# Patient Record
Sex: Female | Born: 1965
Health system: Southern US, Community
[De-identification: ages and names within clinical notes are randomized; demographics above are authoritative.]

## PROBLEM LIST (undated history)

## (undated) DIAGNOSIS — M069 Rheumatoid arthritis, unspecified: Secondary | ICD-10-CM

## (undated) DIAGNOSIS — K259 Gastric ulcer, unspecified as acute or chronic, without hemorrhage or perforation: Secondary | ICD-10-CM

## (undated) DIAGNOSIS — I82409 Acute embolism and thrombosis of unspecified deep veins of unspecified lower extremity: Secondary | ICD-10-CM

## (undated) DIAGNOSIS — K5900 Constipation, unspecified: Secondary | ICD-10-CM

## (undated) DIAGNOSIS — E559 Vitamin D deficiency, unspecified: Secondary | ICD-10-CM

## (undated) DIAGNOSIS — M1990 Unspecified osteoarthritis, unspecified site: Secondary | ICD-10-CM

## (undated) DIAGNOSIS — K5792 Diverticulitis of intestine, part unspecified, without perforation or abscess without bleeding: Secondary | ICD-10-CM

## (undated) DIAGNOSIS — M549 Dorsalgia, unspecified: Secondary | ICD-10-CM

## (undated) DIAGNOSIS — R112 Nausea with vomiting, unspecified: Secondary | ICD-10-CM

## (undated) DIAGNOSIS — R7303 Prediabetes: Secondary | ICD-10-CM

## (undated) DIAGNOSIS — Z789 Other specified health status: Secondary | ICD-10-CM

## (undated) DIAGNOSIS — K589 Irritable bowel syndrome without diarrhea: Secondary | ICD-10-CM

## (undated) DIAGNOSIS — M2550 Pain in unspecified joint: Secondary | ICD-10-CM

## (undated) DIAGNOSIS — Z9889 Other specified postprocedural states: Secondary | ICD-10-CM

## (undated) DIAGNOSIS — I10 Essential (primary) hypertension: Secondary | ICD-10-CM

## (undated) DIAGNOSIS — F419 Anxiety disorder, unspecified: Secondary | ICD-10-CM

## (undated) DIAGNOSIS — D649 Anemia, unspecified: Secondary | ICD-10-CM

## (undated) HISTORY — DX: Acute embolism and thrombosis of unspecified deep veins of unspecified lower extremity: I82.409

## (undated) HISTORY — DX: Rheumatoid arthritis, unspecified: M06.9

## (undated) HISTORY — DX: Pain in unspecified joint: M25.50

## (undated) HISTORY — DX: Dorsalgia, unspecified: M54.9

## (undated) HISTORY — DX: Gastric ulcer, unspecified as acute or chronic, without hemorrhage or perforation: K25.9

## (undated) HISTORY — PX: ABDOMINAL HYSTERECTOMY: SHX81

## (undated) HISTORY — DX: Irritable bowel syndrome, unspecified: K58.9

## (undated) HISTORY — DX: Constipation, unspecified: K59.00

## (undated) HISTORY — DX: Vitamin D deficiency, unspecified: E55.9

## (undated) HISTORY — DX: Unspecified osteoarthritis, unspecified site: M19.90

---

## 2002-11-09 ENCOUNTER — Ambulatory Visit (HOSPITAL_COMMUNITY): Admission: RE | Admit: 2002-11-09 | Discharge: 2002-11-09 | Payer: Self-pay | Admitting: Family Medicine

## 2002-11-09 ENCOUNTER — Encounter: Payer: Self-pay | Admitting: Family Medicine

## 2003-02-18 ENCOUNTER — Ambulatory Visit (HOSPITAL_COMMUNITY): Admission: RE | Admit: 2003-02-18 | Discharge: 2003-02-18 | Payer: Self-pay | Admitting: Family Medicine

## 2007-10-14 ENCOUNTER — Encounter: Admission: RE | Admit: 2007-10-14 | Discharge: 2007-10-14 | Payer: Self-pay | Admitting: Internal Medicine

## 2007-10-23 ENCOUNTER — Encounter: Admission: RE | Admit: 2007-10-23 | Discharge: 2007-10-23 | Payer: Self-pay | Admitting: Internal Medicine

## 2008-03-04 ENCOUNTER — Inpatient Hospital Stay (HOSPITAL_COMMUNITY): Admission: RE | Admit: 2008-03-04 | Discharge: 2008-03-06 | Payer: Self-pay | Admitting: Obstetrics and Gynecology

## 2008-03-04 ENCOUNTER — Encounter (INDEPENDENT_AMBULATORY_CARE_PROVIDER_SITE_OTHER): Payer: Self-pay | Admitting: Obstetrics and Gynecology

## 2010-08-16 NOTE — Op Note (Signed)
[name], [name] NO.:  [id number]   MEDICAL RECORD NO.:  [id number]          PATIENT TYPE:  INP   LOCATION:  9305                          FACILITY:  [facility]   PHYSICIAN:  Juluis Mire, M.D.   DATE OF BIRTH:  11-25-1965   DATE OF PROCEDURE:  03/04/2008  DATE OF DISCHARGE:                               OPERATIVE REPORT   PREOPERATIVE DIAGNOSIS:  Uterine fibroids.   POSTOPERATIVE DIAGNOSES:  1. Uterine fibroids.  2. Sigmoid colonic mass.   OPERATIVE PROCEDURE:  Total abdominal hysterectomy.  Subsequent General  Surgical evaluation of the colonic mass.   SURGEON:  Juluis Mire, MD   ASSISTANT:  Stann Mainland. Grewal, MD   ANESTHESIA:  General endotracheal.   ESTIMATED BLOOD LOSS:  300 mL.   PACKS AND DRAINS:  None.   INJECTABLES:   INTRAOPERATIVE BLOOD PLACED:  None.   COMPLICATIONS:  None.   INDICATIONS:  As dictated in the history and physical.   PROCEDURE:  The patient was taken to the OR and placed in supine  position.  After satisfactory level of general endotracheal anesthesia  was obtained, the abdomen was prepped out with Betadine and draped in  sterile field.  Low-transverse skin incision was made with knife and  carried through subcutaneous tissue.  The anterior rectus fascia was  entered sharply and the incision in the fascia was extended laterally.  Fascia was taken off the muscle superiorly and inferiorly.  Rectus  muscles were separated in the midline.  Peritoneum was entered sharply.  Incision of peritoneum extended both superiorly and inferiorly.  Uterus  delivered through the incision.  It was markedly enlarged with uterine  fibroids approximately 20 weeks in size.  Tubes and ovaries were  unremarkable.  First, the right round ligament was clamped, cut, and  suture ligated with 0-Vicryl.  The right utero-ovarian pedicle was  isolated, clamped, cut, and doubly ligated with a free tie of 0-Vicryl  and then suture ligature of  0-Vicryl.  Because of some bleeding, we did  some back clamping and then identified the uterine vessels on the right  side.  These were clamped, cut, and suture ligated with 0-Vicryl.  We  then went to the left side.  We were able to clamp the left round  ligament and utero-ovarian pedicles together.  We then doubly ligated  these with a free tie of 0-Vicryl and then a suture ligature of 0-  Vicryl.  The bladder flap was then developed using clamp, cut, and tied  technique with suture ligature of 0-Vicryl.  The parametrium was  serially separated from the size of uterus.  Once we got down to the  cervical stump, we cut the uterine fundus free and passed off the  operative field.  We then placed the O'Connor-O'Sullivan retractor in  place and packed bowel content superiorly.  Now, we had a complete view  of the cervical stump.  We made sure the bladder was well off this, and  then continuing the clamp, cut, and tie technique with suture ligature  of 0-Vicryl, the parametrium was confirmed to be separate  from the  cervical stump.  Vaginal angles were clamped and cut.  Intervening  vaginal mucosa was excised and cervical stump was passed off the  operative field and sent to Pathology.  Held angles were secured with  suture ligatures of 0-Vicryl.  The intervening vaginal mucosa was closed  with interrupted figure-of-eight 0-Vicryl.  At this point in time, we  looked at both the ovaries, they were hemostatically intact.  We  thoroughly irrigated the pelvis and we had good hemostasis of the  vaginal cuff.  At this point, we explored the upper abdomen and the  sigmoid colon on the left side was fullness.  We identified the fat that  was encroaching on this was felt to be an inflammatory issue.  The  appendix was visualized and noted to be normal.  We subsequently called  General Surgery in for evaluation.  Dr. Donell Beers came in and felt that  this was probably inflammatory area and recommended  followup  postoperatively.  At this point in time, the muscles and peritoneum were  closed with a running suture of 3-0 Vicryl.  Fascia was closed with a  running suture of 0-PDS.  Skin was closed with staples and Steri-Strips.  Sponge, instrument, and needle count was correct by circulating nurse  x2.  Foley catheter remained clear at the time of closure.  The patient  tolerated the procedure well and was returned to recovery room in good  condition.      Juluis Mire, M.D.  Electronically Signed     JSM/MEDQ  D:  03/04/2008  T:  03/04/2008  Job:  [id number]

## 2010-08-16 NOTE — Discharge Summary (Signed)
NAMEVIANNE, GRIESHOP NO.:  [id number]   MEDICAL RECORD NO.:  [id number]          PATIENT TYPE:  INP   LOCATION:  9305                          FACILITY:  [facility]   PHYSICIAN:  Juluis Mire, M.D.   DATE OF BIRTH:  [birth year]   DATE OF ADMISSION:  03/04/2008  DATE OF DISCHARGE:  03/06/2008                               DISCHARGE SUMMARY   ADMITTING DIAGNOSIS:  Uterine fibroids.   DISCHARGE DIAGNOSIS:  Uterine fibroids.   OPERATIVE PROCEDURE:  Total abdominal hysterectomy.   For complete history and physical, please dictated note.   HOSPITAL COURSE:  The patient underwent above-noted surgery.  Postop did  extremely well.  Postop hemoglobin was 11.1.  She was discharged home on  her second postop day.  At that time, she was tolerating a regular diet,  ambulating without difficulty.  She had normal bladder function.  She  was afebrile with stable vital signs.  Low-transverse incision was  intact.  Abdomen was soft and nontender.  She was passing flatus.  She  had no active vaginal bleeding.   In terms of complications, none were encountered during her stay in the  hospital.  The patient was discharged home in stable condition.   DISPOSITION:  Routine postop instructions were given.  She is to avoid  heavy lifting, vaginal entrance, driving a car.  She is to watch for  signs of infection, nausea, vomiting, increased abdominal pain, or  active vaginal bleeding.  She is also instructed of signs and symptoms  of deep venous thrombosis and pulmonary embolus.   Medications include Tylox.  She will follow up in the office in 1 week  to remove staples.      Juluis Mire, M.D.  Electronically Signed     JSM/MEDQ  D:  03/06/2008  T:  03/06/2008  Job:  [id number]

## 2010-08-16 NOTE — H&P (Signed)
NAME:  [name], BRINES NO.:  [id number]   MEDICAL RECORD NO.:  [id number]          PATIENT TYPE:  AMB   LOCATION:  SDC                           FACILITY:  [facility]   PHYSICIAN:  Juluis Mire, M.D.   DATE OF BIRTH:  June 27, 1965   DATE OF ADMISSION:  DATE OF DISCHARGE:                              HISTORY & PHYSICAL   The patient is a 45 year old nulligravida single female who presents for  total abdominal hysterectomy.   In relation to present admission, the patient was initially referred to  our office on January 06, 2008.  At that point in time, she had been  having increasing menstrual flow.  Cycles were extremely heavy and  prolonged.  She was also having increasing abdominal pain and bloating  that is associated with her cycles.  She had undergone a CT and pelvic  ultrasound that confirmed large uterine fibroids.  Her ovaries were  unremarkable.  Evaluation in the office did reveal fibroids up to the  umbilicus.  We discussed various options.  She decided on hysterectomy  for which she is admitted at the present time.   ALLERGIES:  In terms of allergies, the patient has no known drug  allergies listed.   MEDICATIONS:  She is on Benicar for hypertension, vitamin D replacement,  and Nexium for gastroesophageal reflux disorder.   PAST MEDICAL HISTORY:  Significant that she does have a history of  hypertension under active management.   She has had no previous surgical or obstetrical history.   FAMILY HISTORY:  Noncontributory.   SOCIAL HISTORY:  No tobacco or alcohol use.   REVIEW OF SYSTEMS:  Noncontributory.   PHYSICAL EXAMINATION:  GENERAL:  The patient is afebrile.  VITAL SIGNS:  Stable.  HEENT:  The patient is normocephalic.  Pupils equal, round, and reactive  to light and accommodation.  Extraocular movements are intact.  Sclerae  and conjunctivae are clear.  Oropharynx clear.  NECK:  Without thyromegaly.  BREASTS:  No discrete masses.  LUNGS:   Clear.  CARDIAC:  Regular rate.  No murmurs or gallops.  ABDOMEN:  The uterus rising up to the umbilicus.  Otherwise, abdominal  exam is benign.  PELVIC:  Normal external genitalia.  Vaginal mucosa is clear.  Cervix  unremarkable.  Uterus massively enlarged with fibroids, 20 weeks in  size.  Adnexa difficult to access.  EXTREMITIES:  Trace edema.  NEUROLOGIC:  Grossly within normal limits.   IMPRESSION:  1. Large uterine fibroids with associated symptomatology.  2. Hypertension.   PLAN:  The patient underwent total abdominal hysterectomy.  Per patient  request, ovaries will be conserved.  Potential risk of malignant  transformation explained.  Relieving ovaries can also lead to further  pelvic pain issues from adhesions.  The risk of procedure explained  including the risk of infection.  The risk of hemorrhage.  The patient  is a TE[employer] teacher [employer] and declines any blood transfusions.  We have  discussed that if she had excessive bleeding without transfusions, the  patient could potentially die.  The patient does understand this and  wishes to have  no blood products whatsoever.  Her hemoglobin is  relatively stable at the present time.  She is also at risk of injury to  adjacent organs including bladder, bowel, ureters that could require  further exploratory surgery.  Risk of deep venous thrombosis and  pulmonary emboli.  The patient does understand indications, risks, and  other options.      Juluis Mire, M.D.  Electronically Signed     JSM/MEDQ  D:  03/04/2008  T:  03/04/2008  Job:  [id number]

## 2010-08-16 NOTE — Op Note (Signed)
[name], [name] NO.:  [id number]   MEDICAL RECORD NO.:  [id number]          PATIENT TYPE:  INP   LOCATION:  9305                          FACILITY:  [facility]   PHYSICIAN:  Almond Lint, MD       DATE OF BIRTH:  11-28-1965   DATE OF PROCEDURE:  03/04/2008  DATE OF DISCHARGE:                               OPERATIVE REPORT   PREOPERATIVE DIAGNOSIS:  Colonic inflammation.   POSTOPERATIVE DIAGNOSIS:  Sigmoid mass versus diverticulitis.   PROCEDURES PERFORMED:  1. Exploratory laparotomy.  2. Diagnostic laparoscopy.   FINDINGS:  Inflamed sigmoid with no evidence of perforation, several  diverticula are present, and there is thickening in the sigmoid that is  unclear if it is strictly inflammatory or if there is an internal mass.   DESCRIPTION OF PROCEDURE:  [name] was identified in the holding  area and taken to the operating room, where [name] performed an open  hysterectomy on her for fibroids.  He found inflammation of the sigmoid  and requested an intraoperative General Surgery consultation.  I came in  after he was finished with his portion of the case and examined the  sigmoid.  There were multiple diverticula present on the sigmoid and at  the site of the inflammation, which was midsigmoid, there was some  creeping fat up around the sigmoid and no evidence of lymphadenopathy.  The sigmoid was indeed thickened focally approximately 8 cm in length.  The liver was examined by palpation and there was a spot just to the  left of the falciform on the left lobe, which felt like a potential  metastasis.  Given the fact we had a Pfannenstiel incision, this was not  able to be directly visualized.  The laparoscopy equipment was brought  into the room and the camera was placed over my hand in order to  visualize the liver.  The entire surface of the liver could not be  visualized, but at the spot where on the left of the falciform, the spot  where I could  feel a small subcentimeter nodule, there was no evidence  of metastasis on the surface of the liver.  The liver was again palpated  and a small area near the gallbladder was also palpated and again  nothing was seen on the surface of the liver with laparoscopy.  The  gallbladder appeared normal as well.  The decision was made, since it  was unclear whether this was a mass or a diverticulitis, that she may in  fact not require operative therapy.  She has had no symptoms of  diverticulitis and will require colonoscopy.  In this method we can  assess whether or not she has any colon cancer or mass at the sigmoid  and also evaluate the rest of the colon.  Additionally, discussion could  be held with the patient regarding high-fiber diet if she strictly has  diagnosis of diverticulitis.  [name] closed the patient's abdomen.  The patient should follow up with Dr. Bertram Savin with a colonoscopy in  the next 2-4 weeks.  Almond Lint, MD  Electronically Signed     FB/MEDQ  D:  03/04/2008  T:  03/05/2008  Job:  [id number]

## 2011-01-06 LAB — BASIC METABOLIC PANEL
BUN: 8 mg/dL (ref 6–23)
CO2: 28 mEq/L (ref 19–32)
Calcium: 9.2 mg/dL (ref 8.4–10.5)
Chloride: 97 mEq/L (ref 96–112)
Creatinine, Ser: 0.9 mg/dL (ref 0.4–1.2)
GFR calc Af Amer: >60 mL/min (ref >60)
GFR calc non Af Amer: >60 mL/min (ref >60)
Glucose, Bld: 104 mg/dL — ABNORMAL HIGH (ref 70–99)
Potassium: 3.3 mEq/L — ABNORMAL LOW (ref 3.5–5.1)
Sodium: 135 mEq/L (ref 135–145)

## 2011-01-06 LAB — CBC
HCT: 33.7 % — ABNORMAL LOW (ref 36.0–46.0)
HCT: 38 % (ref 36.0–46.0)
Hemoglobin: 11.1 g/dL — ABNORMAL LOW (ref 12.0–15.0)
Hemoglobin: 12.5 g/dL (ref 12.0–15.0)
MCHC: 32.8 g/dL (ref 30.0–36.0)
MCHC: 33 g/dL (ref 30.0–36.0)
MCV: 80.4 fL (ref 78.0–100.0)
MCV: 80.7 fL (ref 78.0–100.0)
Platelets: 293 10*3/uL (ref 150–400)
Platelets: 354 10*3/uL (ref 150–400)
RBC: 4.18 MIL/uL (ref 3.87–5.11)
RBC: 4.73 MIL/uL (ref 3.87–5.11)
RDW: 17.7 % — ABNORMAL HIGH (ref 11.5–15.5)
RDW: 17.8 % — ABNORMAL HIGH (ref 11.5–15.5)
WBC: 12.1 10*3/uL — ABNORMAL HIGH (ref 4.0–10.5)
WBC: 7.4 10*3/uL (ref 4.0–10.5)

## 2011-01-06 LAB — HCG, SERUM, QUALITATIVE: Preg, Serum: NEGATIVE

## 2012-01-05 ENCOUNTER — Other Ambulatory Visit: Payer: Self-pay | Admitting: Gastroenterology

## 2012-01-05 DIAGNOSIS — R109 Unspecified abdominal pain: Secondary | ICD-10-CM

## 2012-01-08 ENCOUNTER — Other Ambulatory Visit: Payer: Self-pay

## 2012-10-05 ENCOUNTER — Emergency Department (HOSPITAL_COMMUNITY): Payer: BC Managed Care – PPO

## 2012-10-05 ENCOUNTER — Encounter (HOSPITAL_COMMUNITY): Payer: Self-pay | Admitting: *Deleted

## 2012-10-05 ENCOUNTER — Emergency Department (HOSPITAL_COMMUNITY)
Admission: EM | Admit: 2012-10-05 | Discharge: 2012-10-05 | Disposition: A | Discharge status: Home / Self Care | Payer: BC Managed Care – PPO | Attending: Emergency Medicine | Admitting: Emergency Medicine

## 2012-10-05 DIAGNOSIS — K5732 Diverticulitis of large intestine without perforation or abscess without bleeding: Secondary | ICD-10-CM | POA: Insufficient documentation

## 2012-10-05 DIAGNOSIS — K5792 Diverticulitis of intestine, part unspecified, without perforation or abscess without bleeding: Secondary | ICD-10-CM

## 2012-10-05 DIAGNOSIS — Z90710 Acquired absence of both cervix and uterus: Secondary | ICD-10-CM | POA: Insufficient documentation

## 2012-10-05 DIAGNOSIS — R110 Nausea: Secondary | ICD-10-CM | POA: Insufficient documentation

## 2012-10-05 HISTORY — DX: Diverticulitis of intestine, part unspecified, without perforation or abscess without bleeding: K57.92

## 2012-10-05 HISTORY — DX: Essential (primary) hypertension: I10

## 2012-10-05 LAB — URINALYSIS, ROUTINE W REFLEX MICROSCOPIC
Bilirubin Urine: NEGATIVE
Glucose, UA: NEGATIVE mg/dL
Hgb urine dipstick: NEGATIVE
Ketones, ur: NEGATIVE mg/dL
Leukocytes, UA: NEGATIVE
Nitrite: NEGATIVE
Protein, ur: NEGATIVE mg/dL
Specific Gravity, Urine: 1.033 — ABNORMAL HIGH (ref 1.005–1.030)
Urobilinogen, UA: 0.2 mg/dL (ref 0.0–1.0)
pH: 6 (ref 5.0–8.0)

## 2012-10-05 LAB — CBC WITH DIFFERENTIAL/PLATELET
Basophils Absolute: 0 10*3/uL (ref 0.0–0.1)
Basophils Relative: 0 % (ref 0–1)
Eosinophils Absolute: 0.1 10*3/uL (ref 0.0–0.7)
Eosinophils Relative: 0 % (ref 0–5)
HCT: 34.7 % — ABNORMAL LOW (ref 36.0–46.0)
Hemoglobin: 12.3 g/dL (ref 12.0–15.0)
Lymphocytes Relative: 16 % (ref 12–46)
Lymphs Abs: 2 10*3/uL (ref 0.7–4.0)
MCH: 26.8 pg (ref 26.0–34.0)
MCHC: 35.4 g/dL (ref 30.0–36.0)
MCV: 75.6 fL — ABNORMAL LOW (ref 78.0–100.0)
Monocytes Absolute: 0.7 10*3/uL (ref 0.1–1.0)
Monocytes Relative: 6 % (ref 3–12)
Neutro Abs: 9.6 10*3/uL — ABNORMAL HIGH (ref 1.7–7.7)
Neutrophils Relative %: 78 % — ABNORMAL HIGH (ref 43–77)
Platelets: 254 10*3/uL (ref 150–400)
RBC: 4.59 MIL/uL (ref 3.87–5.11)
RDW: 14.9 % (ref 11.5–15.5)
WBC: 12.4 10*3/uL — ABNORMAL HIGH (ref 4.0–10.5)

## 2012-10-05 LAB — POCT I-STAT, CHEM 8
BUN: 15 mg/dL (ref 6–23)
Calcium, Ion: 1.15 mmol/L (ref 1.12–1.23)
Chloride: 106 mEq/L (ref 96–112)
Creatinine, Ser: 0.9 mg/dL (ref 0.50–1.10)
Glucose, Bld: 99 mg/dL (ref 70–99)
HCT: 38 % (ref 36.0–46.0)
Hemoglobin: 12.9 g/dL (ref 12.0–15.0)
Potassium: 3.6 mEq/L (ref 3.5–5.1)
Sodium: 141 mEq/L (ref 135–145)
TCO2: 24 mmol/L (ref 0–100)

## 2012-10-05 MED ORDER — ONDANSETRON HCL 4 MG/2ML IJ SOLN
4.0000 mg | Freq: Once | INTRAMUSCULAR | Status: AC
  Administered 2012-10-05: 4 mg via INTRAVENOUS
  Filled 2012-10-05: qty 2

## 2012-10-05 MED ORDER — IOHEXOL 300 MG/ML  SOLN
50.0000 mL | Freq: Once | INTRAMUSCULAR | Status: AC | PRN
  Administered 2012-10-05: 50 mL via ORAL

## 2012-10-05 MED ORDER — IOHEXOL 300 MG/ML  SOLN
100.0000 mL | Freq: Once | INTRAMUSCULAR | Status: AC | PRN
  Administered 2012-10-05: 100 mL via INTRAVENOUS

## 2012-10-05 MED ORDER — ONDANSETRON HCL 8 MG PO TABS: 8.0000 mg | ORAL_TABLET | Freq: Three times a day (TID) | ORAL | Status: DC | PRN

## 2012-10-05 MED ORDER — CIPROFLOXACIN HCL 500 MG PO TABS
500.0000 mg | ORAL_TABLET | Freq: Once | ORAL | Status: AC
  Administered 2012-10-05: 500 mg via ORAL
  Filled 2012-10-05: qty 1

## 2012-10-05 MED ORDER — CIPROFLOXACIN HCL 500 MG PO TABS: 500.0000 mg | ORAL_TABLET | Freq: Two times a day (BID) | ORAL | Status: DC

## 2012-10-05 MED ORDER — METRONIDAZOLE 500 MG PO TABS: 500.0000 mg | ORAL_TABLET | Freq: Three times a day (TID) | ORAL | Status: DC

## 2012-10-05 MED ORDER — METRONIDAZOLE 500 MG PO TABS
500.0000 mg | ORAL_TABLET | Freq: Once | ORAL | Status: AC
  Administered 2012-10-05: 500 mg via ORAL
  Filled 2012-10-05: qty 1

## 2012-10-05 NOTE — ED Notes (Signed)
Pt presents to ed with c/o abdominal pain x 1 week, pt sts was seen by her PCP and was diagnosed with diverticulitis; pt sts was given antibiotics and was put on liquid diet but per pt it's not working. Pt reports generalized abd.pain but she says it's much worse on the left side. Pt also reports nausea and occasional fever.

## 2012-10-05 NOTE — ED Provider Notes (Signed)
History    CSN: [id number] Arrival date & time 10/05/12  4098  First MD Initiated Contact with Patient 10/05/12 [phone]     No chief complaint on file.  (Consider location/radiation/quality/duration/timing/severity/associated sxs/prior Treatment) HPI complains of left-sided abdominal pain, burning in nature onset several months ago becoming worse over the past week. Chills and subjective fever 6 days ago. Accompanying symptoms include nausea. No vomiting. Patient had 4 bowel movements today, normal. Pain became worse last night after she ate a fried pork chop. Pain feels like diverticulitis she's had in the past. No treatment prior to coming here. Pain is worse with eating. Improve when she doesn't eat. Pain is mild at present. Nonradiating. No past medical history on file. No past surgical history on file. No family history on file. History  Substance Use Topics  . Smoking status: Not on file  . Smokeless tobacco: Not on file  . Alcohol Use: Not on file   past medical history hypertension, diverticulitis   Past surgical history hysterectomy Social history no tobacco no alcohol no drugs   OB History   No data available     Review of Systems  Constitutional: Negative.   HENT: Negative.   Respiratory: Negative.   Cardiovascular: Negative.   Gastrointestinal: Positive for nausea and abdominal pain.  Musculoskeletal: Negative.   Skin: Negative.   Neurological: Negative.   Psychiatric/Behavioral: Negative.   All other systems reviewed and are negative.    Allergies  Review of patient's allergies indicates not on file.  Home Medications  No current outpatient prescriptions on file. BP 117/72  Pulse 114  Temp(Src) 97.7 F (36.5 C) (Oral)  Resp 18  SpO2 100% Physical Exam  Nursing note and vitals reviewed. Constitutional: She appears well-developed and well-nourished.  HENT:  Head: Normocephalic and atraumatic.  Eyes: Conjunctivae are normal. Pupils are equal, round,  and reactive to light.  Neck: Neck supple. No tracheal deviation present. No thyromegaly present.  Cardiovascular: Normal rate and regular rhythm.   No murmur heard. Pulmonary/Chest: Effort normal and breath sounds normal.  Abdominal: Soft. Bowel sounds are normal. She exhibits no distension and no mass. There is tenderness. There is no rebound and no guarding.  Obese, tender at left upper and left lower quadrants  Musculoskeletal: Normal range of motion. She exhibits no edema and no tenderness.  Neurological: She is alert. Coordination normal.  Skin: Skin is warm and dry. No rash noted.  Psychiatric: She has a normal mood and affect.    ED Course  Procedures (including critical care time) Labs Reviewed - No data to display No results found. No diagnosis found. Declines pain medicine  11:50 AM patient resting comfortably. Pain is minimal. Nausea has resolved after treatment with intravenous Zofran. Results for orders placed during the hospital encounter of 10/05/12  CBC WITH DIFFERENTIAL      Result Value Range   WBC 12.4 (*) 4.0 - 10.5 K/uL   RBC 4.59  3.87 - 5.11 MIL/uL   Hemoglobin 12.3  12.0 - 15.0 g/dL   HCT 47.8 (*) 29.5 - 62.1 %   MCV 75.6 (*) 78.0 - 100.0 fL   MCH 26.8  26.0 - 34.0 pg   MCHC 35.4  30.0 - 36.0 g/dL   RDW 30.8  65.7 - 84.6 %   Platelets 254  150 - 400 K/uL   Neutrophils Relative % 78 (*) 43 - 77 %   Neutro Abs 9.6 (*) 1.7 - 7.7 K/uL   Lymphocytes Relative 16  12 - 46 %   Lymphs Abs 2.0  0.7 - 4.0 K/uL   Monocytes Relative 6  3 - 12 %   Monocytes Absolute 0.7  0.1 - 1.0 K/uL   Eosinophils Relative 0  0 - 5 %   Eosinophils Absolute 0.1  0.0 - 0.7 K/uL   Basophils Relative 0  0 - 1 %   Basophils Absolute 0.0  0.0 - 0.1 K/uL  URINALYSIS, ROUTINE W REFLEX MICROSCOPIC      Result Value Range   Color, Urine YELLOW  YELLOW   APPearance CLEAR  CLEAR   Specific Gravity, Urine 1.033 (*) 1.005 - 1.030   pH 6.0  5.0 - 8.0   Glucose, UA NEGATIVE  NEGATIVE  mg/dL   Hgb urine dipstick NEGATIVE  NEGATIVE   Bilirubin Urine NEGATIVE  NEGATIVE   Ketones, ur NEGATIVE  NEGATIVE mg/dL   Protein, ur NEGATIVE  NEGATIVE mg/dL   Urobilinogen, UA 0.2  0.0 - 1.0 mg/dL   Nitrite NEGATIVE  NEGATIVE   Leukocytes, UA NEGATIVE  NEGATIVE  POCT I-STAT, CHEM 8      Result Value Range   Sodium 141  135 - 145 mEq/L   Potassium 3.6  3.5 - 5.1 mEq/L   Chloride 106  96 - 112 mEq/L   BUN 15  6 - 23 mg/dL   Creatinine, Ser 1.61  0.50 - 1.10 mg/dL   Glucose, Bld 99  70 - 99 mg/dL   Calcium, Ion 0.96  0.45 - 1.23 mmol/L   TCO2 24  0 - 100 mmol/L   Hemoglobin 12.9  12.0 - 15.0 g/dL   HCT 40.9  81.1 - 91.4 %   Ct Abdomen Pelvis W Contrast  10/05/2012   *RADIOLOGY REPORT*  Clinical Data: Left abdominal pain, clinically diagnosed with diverticulitis  CT ABDOMEN AND PELVIS WITH CONTRAST  Technique:  Multidetector CT imaging of the abdomen and pelvis was performed following the standard protocol during bolus administration of intravenous contrast.  Contrast: 100 ml Omnipaque-300 IV  Comparison: None.  Findings: Lung bases are clear.  Tiny hiatal hernia.  Liver, spleen, pancreas, and adrenal glands within normal limits.  Gallbladder is unremarkable.  No intrahepatic or extrahepatic ductal dilatation.  Kidneys are within normal limits.  No hydronephrosis.  No evidence of bowel obstruction.  Extensive colonic diverticulosis.  Wall thickening/inflammatory changes involving a segment of sigmoid colon, compatible with reported history of sigmoid diverticulitis. No drainable fluid collection/abscess.  No free air.  However, a suspected early/developing fistulous tract between two adjacent loops of sigmoid colon (or sigmoid colon and adjacent left ovary) is present (series 2/images 56-60).  No evidence of abdominal aortic aneurysm.  Trace pelvic ascites.  No suspicious abdominopelvic lymphadenopathy.  Status post hysterectomy. Right ovary is unremarkable.  Left ovary is mildly  enlarged/heterogeneous, measuring 5.5 x 4.2 cm (series 2/image 64), and this is immediately adjacent to the suspected sigmoid diverticulitis.  Superimposed infection is possible.  Bladder is unremarkable.  Visualized osseous structures are within normal limits.  IMPRESSION:  Suspected sigmoid diverticulitis.  Suspected early/developing fistulous tract between two adjacent loops of sigmoid colon.  Possible secondary involvement of the adjacent left ovary.  No drainable fluid collection/abscess or free air.  Follow-up colonoscopy is suggested.   Original Report Authenticated By: Charline Bills, M.D.    MDM  Patient is suitable for outpatient therapy. No vomiting. Symptoms mild. Patient nontoxic appearing Plan prescription Cipro,flagy, Zofran.f/u Dr Ihor Dow Diagnosis diverticulitis  Doug Sou, MD 10/05/12 1212

## 2016-05-19 DIAGNOSIS — K649 Unspecified hemorrhoids: Secondary | ICD-10-CM | POA: Diagnosis not present

## 2016-05-19 DIAGNOSIS — R109 Unspecified abdominal pain: Secondary | ICD-10-CM | POA: Diagnosis not present

## 2016-06-05 DIAGNOSIS — K5792 Diverticulitis of intestine, part unspecified, without perforation or abscess without bleeding: Secondary | ICD-10-CM | POA: Diagnosis not present

## 2016-06-05 DIAGNOSIS — R1012 Left upper quadrant pain: Secondary | ICD-10-CM | POA: Diagnosis not present

## 2016-06-05 DIAGNOSIS — R1032 Left lower quadrant pain: Secondary | ICD-10-CM | POA: Diagnosis not present

## 2016-06-19 DIAGNOSIS — K5792 Diverticulitis of intestine, part unspecified, without perforation or abscess without bleeding: Secondary | ICD-10-CM | POA: Diagnosis not present

## 2016-06-19 DIAGNOSIS — K589 Irritable bowel syndrome without diarrhea: Secondary | ICD-10-CM | POA: Diagnosis not present

## 2016-12-11 ENCOUNTER — Emergency Department (HOSPITAL_COMMUNITY)
Admission: EM | Admit: 2016-12-11 | Discharge: 2016-12-11 | Discharge status: Absent without leave | Payer: Commercial Managed Care - PPO

## 2017-01-02 DIAGNOSIS — K589 Irritable bowel syndrome without diarrhea: Secondary | ICD-10-CM | POA: Diagnosis not present

## 2017-01-02 DIAGNOSIS — R1032 Left lower quadrant pain: Secondary | ICD-10-CM | POA: Diagnosis not present

## 2017-01-03 ENCOUNTER — Other Ambulatory Visit: Payer: Self-pay | Admitting: Family Medicine

## 2017-01-03 DIAGNOSIS — R1032 Left lower quadrant pain: Secondary | ICD-10-CM

## 2017-01-12 ENCOUNTER — Other Ambulatory Visit: Payer: Commercial Managed Care - PPO

## 2017-01-16 ENCOUNTER — Other Ambulatory Visit: Payer: Commercial Managed Care - PPO

## 2017-01-17 ENCOUNTER — Other Ambulatory Visit: Payer: Commercial Managed Care - PPO

## 2017-01-26 ENCOUNTER — Other Ambulatory Visit: Payer: Commercial Managed Care - PPO

## 2017-05-23 DIAGNOSIS — R109 Unspecified abdominal pain: Secondary | ICD-10-CM | POA: Diagnosis not present

## 2017-05-23 DIAGNOSIS — I10 Essential (primary) hypertension: Secondary | ICD-10-CM | POA: Diagnosis not present

## 2017-05-23 DIAGNOSIS — R110 Nausea: Secondary | ICD-10-CM | POA: Diagnosis not present

## 2017-07-03 DIAGNOSIS — Z1231 Encounter for screening mammogram for malignant neoplasm of breast: Secondary | ICD-10-CM | POA: Diagnosis not present

## 2017-07-03 DIAGNOSIS — I10 Essential (primary) hypertension: Secondary | ICD-10-CM | POA: Diagnosis not present

## 2017-07-03 DIAGNOSIS — Z0000 Encounter for general adult medical examination without abnormal findings: Secondary | ICD-10-CM | POA: Diagnosis not present

## 2017-07-03 DIAGNOSIS — D509 Iron deficiency anemia, unspecified: Secondary | ICD-10-CM | POA: Diagnosis not present

## 2017-07-03 DIAGNOSIS — K589 Irritable bowel syndrome without diarrhea: Secondary | ICD-10-CM | POA: Diagnosis not present

## 2018-05-01 DIAGNOSIS — M25562 Pain in left knee: Secondary | ICD-10-CM | POA: Diagnosis not present

## 2018-05-01 DIAGNOSIS — I10 Essential (primary) hypertension: Secondary | ICD-10-CM | POA: Diagnosis not present

## 2019-07-29 ENCOUNTER — Other Ambulatory Visit: Payer: Self-pay | Admitting: Family Medicine

## 2019-07-29 DIAGNOSIS — Z1231 Encounter for screening mammogram for malignant neoplasm of breast: Secondary | ICD-10-CM

## 2019-11-02 ENCOUNTER — Inpatient Hospital Stay (HOSPITAL_COMMUNITY)
Admission: EM | Admit: 2019-11-02 | Discharge: 2019-11-10 | DRG: 871 | Disposition: A | Discharge status: Home / Self Care | Payer: Commercial Managed Care - PPO | Attending: Family Medicine | Admitting: Family Medicine

## 2019-11-02 ENCOUNTER — Emergency Department (HOSPITAL_COMMUNITY): Payer: Commercial Managed Care - PPO

## 2019-11-02 ENCOUNTER — Other Ambulatory Visit: Payer: Self-pay

## 2019-11-02 ENCOUNTER — Encounter (HOSPITAL_COMMUNITY): Payer: Self-pay | Admitting: Emergency Medicine

## 2019-11-02 DIAGNOSIS — A419 Sepsis, unspecified organism: Principal | ICD-10-CM | POA: Diagnosis present

## 2019-11-02 DIAGNOSIS — K651 Peritoneal abscess: Secondary | ICD-10-CM

## 2019-11-02 DIAGNOSIS — Z8249 Family history of ischemic heart disease and other diseases of the circulatory system: Secondary | ICD-10-CM

## 2019-11-02 DIAGNOSIS — Z20822 Contact with and (suspected) exposure to covid-19: Secondary | ICD-10-CM | POA: Diagnosis present

## 2019-11-02 DIAGNOSIS — I10 Essential (primary) hypertension: Secondary | ICD-10-CM | POA: Diagnosis present

## 2019-11-02 DIAGNOSIS — Z79899 Other long term (current) drug therapy: Secondary | ICD-10-CM

## 2019-11-02 DIAGNOSIS — L02211 Cutaneous abscess of abdominal wall: Secondary | ICD-10-CM

## 2019-11-02 DIAGNOSIS — D638 Anemia in other chronic diseases classified elsewhere: Secondary | ICD-10-CM | POA: Diagnosis present

## 2019-11-02 DIAGNOSIS — D72829 Elevated white blood cell count, unspecified: Secondary | ICD-10-CM | POA: Diagnosis not present

## 2019-11-02 DIAGNOSIS — N321 Vesicointestinal fistula: Secondary | ICD-10-CM | POA: Diagnosis present

## 2019-11-02 DIAGNOSIS — B965 Pseudomonas (aeruginosa) (mallei) (pseudomallei) as the cause of diseases classified elsewhere: Secondary | ICD-10-CM | POA: Diagnosis present

## 2019-11-02 DIAGNOSIS — D509 Iron deficiency anemia, unspecified: Secondary | ICD-10-CM | POA: Diagnosis present

## 2019-11-02 DIAGNOSIS — E876 Hypokalemia: Secondary | ICD-10-CM | POA: Diagnosis present

## 2019-11-02 DIAGNOSIS — L0291 Cutaneous abscess, unspecified: Secondary | ICD-10-CM

## 2019-11-02 DIAGNOSIS — E871 Hypo-osmolality and hyponatremia: Secondary | ICD-10-CM | POA: Diagnosis present

## 2019-11-02 DIAGNOSIS — K5792 Diverticulitis of intestine, part unspecified, without perforation or abscess without bleeding: Secondary | ICD-10-CM | POA: Diagnosis not present

## 2019-11-02 DIAGNOSIS — R6520 Severe sepsis without septic shock: Secondary | ICD-10-CM | POA: Diagnosis present

## 2019-11-02 HISTORY — DX: Peritoneal abscess: K65.1

## 2019-11-02 LAB — URINALYSIS, ROUTINE W REFLEX MICROSCOPIC
Bacteria, UA: NONE SEEN
Bilirubin Urine: NEGATIVE
Glucose, UA: NEGATIVE mg/dL
Ketones, ur: 80 mg/dL — AB
Leukocytes,Ua: NEGATIVE
Nitrite: NEGATIVE
Protein, ur: 100 mg/dL — AB
Specific Gravity, Urine: 1.021 (ref 1.005–1.030)
pH: 5 (ref 5.0–8.0)

## 2019-11-02 LAB — CBC WITH DIFFERENTIAL/PLATELET
Abs Immature Granulocytes: 0.13 10*3/uL — ABNORMAL HIGH (ref 0.00–0.07)
Basophils Absolute: 0.1 10*3/uL (ref 0.0–0.1)
Basophils Relative: 0 %
Eosinophils Absolute: 0 10*3/uL (ref 0.0–0.5)
Eosinophils Relative: 0 %
HCT: 31.3 % — ABNORMAL LOW (ref 36.0–46.0)
Hemoglobin: 9.9 g/dL — ABNORMAL LOW (ref 12.0–15.0)
Immature Granulocytes: 1 %
Lymphocytes Relative: 11 %
Lymphs Abs: 2 10*3/uL (ref 0.7–4.0)
MCH: 22.8 pg — ABNORMAL LOW (ref 26.0–34.0)
MCHC: 31.6 g/dL (ref 30.0–36.0)
MCV: 72.1 fL — ABNORMAL LOW (ref 80.0–100.0)
Monocytes Absolute: 2.1 10*3/uL — ABNORMAL HIGH (ref 0.1–1.0)
Monocytes Relative: 11 %
Neutro Abs: 14.3 10*3/uL — ABNORMAL HIGH (ref 1.7–7.7)
Neutrophils Relative %: 77 %
Platelets: 435 10*3/uL — ABNORMAL HIGH (ref 150–400)
RBC: 4.34 MIL/uL (ref 3.87–5.11)
RDW: 17.2 % — ABNORMAL HIGH (ref 11.5–15.5)
WBC: 18.6 10*3/uL — ABNORMAL HIGH (ref 4.0–10.5)
nRBC: 0 % (ref 0.0–0.2)

## 2019-11-02 LAB — COMPREHENSIVE METABOLIC PANEL
ALT: 13 U/L (ref 0–44)
AST: 17 U/L (ref 15–41)
Albumin: 2.6 g/dL — ABNORMAL LOW (ref 3.5–5.0)
Alkaline Phosphatase: 72 U/L (ref 38–126)
Anion gap: 17 — ABNORMAL HIGH (ref 5–15)
BUN: 7 mg/dL (ref 6–20)
CO2: 17 mmol/L — ABNORMAL LOW (ref 22–32)
Calcium: 8.7 mg/dL — ABNORMAL LOW (ref 8.9–10.3)
Chloride: 98 mmol/L (ref 98–111)
Creatinine, Ser: 0.8 mg/dL (ref 0.44–1.00)
GFR calc Af Amer: >60 mL/min (ref >60)
GFR calc non Af Amer: >60 mL/min (ref >60)
Glucose, Bld: 106 mg/dL — ABNORMAL HIGH (ref 70–99)
Potassium: 3.1 mmol/L — ABNORMAL LOW (ref 3.5–5.1)
Sodium: 132 mmol/L — ABNORMAL LOW (ref 135–145)
Total Bilirubin: 1.7 mg/dL — ABNORMAL HIGH (ref 0.3–1.2)
Total Protein: 7.7 g/dL (ref 6.5–8.1)

## 2019-11-02 LAB — LACTIC ACID, PLASMA
Lactic Acid, Venous: 2.2 mmol/L (ref 0.5–1.9)
Lactic Acid, Venous: 1.5 mmol/L (ref 0.5–1.9)

## 2019-11-02 LAB — SARS CORONAVIRUS 2 BY RT PCR (HOSPITAL ORDER, PERFORMED IN ~~LOC~~ HOSPITAL LAB): SARS Coronavirus 2: NEGATIVE

## 2019-11-02 LAB — HIV ANTIBODY (ROUTINE TESTING W REFLEX): HIV Screen 4th Generation wRfx: NONREACTIVE

## 2019-11-02 MED ORDER — HYDRALAZINE HCL 25 MG PO TABS: 25.0000 mg | ORAL_TABLET | Freq: Four times a day (QID) | ORAL | Status: DC | PRN

## 2019-11-02 MED ORDER — ALPRAZOLAM 0.5 MG PO TABS
0.5000 mg | ORAL_TABLET | Freq: Three times a day (TID) | ORAL | Status: DC | PRN
  Administered 2019-11-02 – 2019-11-10 (×4): 0.5 mg via ORAL
  Filled 2019-11-02 (×4): qty 1

## 2019-11-02 MED ORDER — LACTATED RINGERS IV BOLUS
1000.0000 mL | Freq: Once | INTRAVENOUS | Status: AC
  Administered 2019-11-02: 1000 mL via INTRAVENOUS

## 2019-11-02 MED ORDER — HYDROMORPHONE HCL 1 MG/ML IJ SOLN
0.5000 mg | INTRAMUSCULAR | Status: DC | PRN
  Administered 2019-11-04 – 2019-11-06 (×9): 1 mg via INTRAVENOUS
  Filled 2019-11-02 (×9): qty 1

## 2019-11-02 MED ORDER — ONDANSETRON HCL 4 MG PO TABS
4.0000 mg | ORAL_TABLET | Freq: Four times a day (QID) | ORAL | Status: DC | PRN
  Administered 2019-11-03 – 2019-11-10 (×7): 4 mg via ORAL
  Filled 2019-11-02 (×7): qty 1

## 2019-11-02 MED ORDER — LOSARTAN POTASSIUM-HCTZ 50-12.5 MG PO TABS: 1.0000 | ORAL_TABLET | Freq: Every day | ORAL | Status: DC

## 2019-11-02 MED ORDER — METRONIDAZOLE IN NACL 5-0.79 MG/ML-% IV SOLN
500.0000 mg | Freq: Once | INTRAVENOUS | Status: AC
  Administered 2019-11-02: 500 mg via INTRAVENOUS
  Filled 2019-11-02: qty 100

## 2019-11-02 MED ORDER — ONDANSETRON HCL 4 MG/2ML IJ SOLN
4.0000 mg | Freq: Four times a day (QID) | INTRAMUSCULAR | Status: DC | PRN
  Administered 2019-11-02 – 2019-11-08 (×10): 4 mg via INTRAVENOUS
  Filled 2019-11-02 (×10): qty 2

## 2019-11-02 MED ORDER — SODIUM CHLORIDE 0.9 % IV SOLN
1.0000 g | Freq: Once | INTRAVENOUS | Status: AC
  Administered 2019-11-02: 1 g via INTRAVENOUS
  Filled 2019-11-02: qty 10

## 2019-11-02 MED ORDER — SODIUM CHLORIDE 0.9 % IV SOLN: INTRAVENOUS | Status: DC

## 2019-11-02 MED ORDER — HYDROCHLOROTHIAZIDE 12.5 MG PO CAPS: 12.5000 mg | ORAL_CAPSULE | Freq: Every day | ORAL | Status: DC

## 2019-11-02 MED ORDER — ACETAMINOPHEN 650 MG RE SUPP
650.0000 mg | Freq: Four times a day (QID) | RECTAL | Status: DC | PRN
  Administered 2019-11-03: 650 mg via RECTAL
  Filled 2019-11-02: qty 1

## 2019-11-02 MED ORDER — ENOXAPARIN SODIUM 40 MG/0.4ML ~~LOC~~ SOLN
40.0000 mg | SUBCUTANEOUS | Status: DC
  Administered 2019-11-02: 40 mg via SUBCUTANEOUS
  Filled 2019-11-02: qty 0.4

## 2019-11-02 MED ORDER — HYOSCYAMINE SULFATE 0.125 MG PO TBDP
0.1250 mg | ORAL_TABLET | Freq: Four times a day (QID) | ORAL | Status: DC | PRN
  Filled 2019-11-02 (×2): qty 1

## 2019-11-02 MED ORDER — PROMETHAZINE HCL 25 MG PO TABS: 25.0000 mg | ORAL_TABLET | Freq: Two times a day (BID) | ORAL | Status: DC | PRN

## 2019-11-02 MED ORDER — METRONIDAZOLE IN NACL 5-0.79 MG/ML-% IV SOLN
500.0000 mg | Freq: Three times a day (TID) | INTRAVENOUS | Status: DC
  Administered 2019-11-02: 500 mg via INTRAVENOUS
  Filled 2019-11-02: qty 100

## 2019-11-02 MED ORDER — LOSARTAN POTASSIUM 50 MG PO TABS: 50.0000 mg | ORAL_TABLET | Freq: Every day | ORAL | Status: DC

## 2019-11-02 MED ORDER — IOHEXOL 300 MG/ML  SOLN
100.0000 mL | Freq: Once | INTRAMUSCULAR | Status: AC | PRN
  Administered 2019-11-02: 100 mL via INTRAVENOUS

## 2019-11-02 MED ORDER — ACETAMINOPHEN 325 MG PO TABS
650.0000 mg | ORAL_TABLET | Freq: Four times a day (QID) | ORAL | Status: DC | PRN
  Administered 2019-11-03: 650 mg via ORAL
  Filled 2019-11-02: qty 2

## 2019-11-02 MED ORDER — KETOROLAC TROMETHAMINE 30 MG/ML IJ SOLN
30.0000 mg | Freq: Four times a day (QID) | INTRAMUSCULAR | Status: AC | PRN
  Administered 2019-11-02 – 2019-11-03 (×3): 30 mg via INTRAVENOUS
  Filled 2019-11-02 (×3): qty 1

## 2019-11-02 NOTE — ED Provider Notes (Signed)
[facility] EMERGENCY DEPARTMENT Provider Note   CSN: [id number] Arrival date & time: 11/02/19  0548     History Chief Complaint  Patient presents with  . Abdominal Lump    Daje Monsen is a 54 y.o. female.   Illness Location:  Suprapubic area Quality:  Pain and swelling Severity:  Moderate Duration:  2 days Timing:  Constant Progression:  Worsening Chronicity:  New Context:  Unknown cause Relieved by:  Nothing Worsened by:  Palpaiton and sitting  Ineffective treatments:  None tried Associated symptoms: abdominal pain   Associated symptoms: no chest pain, no congestion, no cough, no diarrhea, no fever, no headaches, no nausea, no rash, no rhinorrhea, no shortness of breath and no vomiting        Past Medical History:  Diagnosis Date  . Diverticulitis   . Hypertension     Patient Active Problem List   Diagnosis Date Noted  . Diverticulitis 11/02/2019  . Intra-abdominal abscess (HCC) 11/02/2019    Past Surgical History:  Procedure Laterality Date  . ABDOMINAL HYSTERECTOMY       OB History   No obstetric history on file.     No family history on file.  Social History   Tobacco Use  . Smoking status: Never Smoker  . Smokeless tobacco: Never Used  Substance Use Topics  . Alcohol use: No  . Drug use: No    Home Medications Prior to Admission medications   Medication Sig Start Date End Date Taking? Authorizing Provider  ALPRAZolam Prudy Feeler) 0.5 MG tablet Take 0.5 mg by mouth 3 (three) times daily as needed for anxiety.   Yes [provider]  hyoscyamine (LEVSIN) 0.125 MG tablet Take 0.125 mg by mouth every 6 (six) hours as needed for bladder spasms or cramping.  10/21/19  Yes [provider]  losartan-hydrochlorothiazide (HYZAAR) 50-12.5 MG per tablet Take 1 tablet by mouth daily.   Yes [provider]  promethazine (PHENERGAN) 25 MG tablet Take 25 mg by mouth 2 (two) times daily as needed for nausea or  vomiting.  10/21/19  Yes [provider]  ciprofloxacin (CIPRO) 500 MG tablet Take 1 tablet (500 mg total) by mouth 2 (two) times daily. One po bid x 7 days Patient not taking: Reported on 11/02/2019 10/05/12   Doug Sou, MD  metroNIDAZOLE (FLAGYL) 500 MG tablet Take 1 tablet (500 mg total) by mouth 3 (three) times daily. Patient not taking: Reported on 11/02/2019 10/05/12   Doug Sou, MD  ondansetron (ZOFRAN) 8 MG tablet Take 1 tablet (8 mg total) by mouth every 8 (eight) hours as needed for nausea. Patient not taking: Reported on 11/02/2019 10/05/12   Doug Sou, MD    Allergies    Patient has no known allergies.  Review of Systems   Review of Systems  Constitutional: Negative for chills and fever.  HENT: Negative for congestion and rhinorrhea.   Respiratory: Negative for cough and shortness of breath.   Cardiovascular: Negative for chest pain and palpitations.  Gastrointestinal: Positive for abdominal pain. Negative for diarrhea, nausea and vomiting.  Genitourinary: Negative for difficulty urinating, dysuria, vaginal bleeding, vaginal discharge and vaginal pain.  Musculoskeletal: Negative for arthralgias and back pain.  Skin: Negative for rash and wound.  Neurological: Negative for light-headedness and headaches.    Physical Exam Updated Vital Signs BP (!) 106/61   Pulse (!) 109   Temp 99 F (37.2 C) (Oral)   Resp 18   Ht 5\' 6"  (1.676 m)  Wt 90 kg   SpO2 100%   BMI 32.02 kg/m   Physical Exam Vitals and nursing note reviewed. Exam conducted with a chaperone present.  Constitutional:      General: She is not in acute distress.    Appearance: Normal appearance.  HENT:     Head: Normocephalic and atraumatic.     Nose: No rhinorrhea.  Eyes:     General:        Right eye: No discharge.        Left eye: No discharge.     Conjunctiva/sclera: Conjunctivae normal.  Cardiovascular:     Rate and Rhythm: Normal rate and regular rhythm.  Pulmonary:     Effort:  Pulmonary effort is normal. No respiratory distress.     Breath sounds: No stridor.  Abdominal:     General: Abdomen is flat. There is no distension.     Palpations: Abdomen is soft.     Tenderness: There is abdominal tenderness in the suprapubic area.    Musculoskeletal:        General: No tenderness or signs of injury.  Skin:    General: Skin is warm and dry.  Neurological:     General: No focal deficit present.     Mental Status: She is alert. Mental status is at baseline.     Motor: No weakness.  Psychiatric:        Mood and Affect: Mood normal.        Behavior: Behavior normal.     ED Results / Procedures / Treatments   Labs (all labs ordered are listed, but only abnormal results are displayed) Labs Reviewed  CBC WITH DIFFERENTIAL/PLATELET - Abnormal; Notable for the following components:      Result Value   WBC 18.6 (*)    Hemoglobin 9.9 (*)    HCT 31.3 (*)    MCV 72.1 (*)    MCH 22.8 (*)    RDW 17.2 (*)    Platelets 435 (*)    Neutro Abs 14.3 (*)    Monocytes Absolute 2.1 (*)    Abs Immature Granulocytes 0.13 (*)    All other components within normal limits  COMPREHENSIVE METABOLIC PANEL - Abnormal; Notable for the following components:   Sodium 132 (*)    Potassium 3.1 (*)    CO2 17 (*)    Glucose, Bld 106 (*)    Calcium 8.7 (*)    Albumin 2.6 (*)    Total Bilirubin 1.7 (*)    Anion gap 17 (*)    All other components within normal limits  URINALYSIS, ROUTINE W REFLEX MICROSCOPIC - Abnormal; Notable for the following components:   Color, Urine AMBER (*)    APPearance HAZY (*)    Hgb urine dipstick SMALL (*)    Ketones, ur 80 (*)    Protein, ur 100 (*)    All other components within normal limits  LACTIC ACID, PLASMA - Abnormal; Notable for the following components:   Lactic Acid, Venous 2.2 (*)    All other components within normal limits  SARS CORONAVIRUS 2 BY RT PCR (HOSPITAL ORDER, PERFORMED IN [facility])  LACTIC ACID, PLASMA  HIV  ANTIBODY (ROUTINE TESTING W REFLEX)  BASIC METABOLIC PANEL  CBC    EKG None  Radiology CT ABDOMEN PELVIS W CONTRAST  Result Date: 11/02/2019 CLINICAL DATA:  Abdominal pain. EXAM: CT ABDOMEN AND PELVIS WITH CONTRAST TECHNIQUE: Multidetector CT imaging of the abdomen and pelvis was performed using the  standard protocol following bolus administration of intravenous contrast. CONTRAST:  OMNIPAQUE IOHEXOL 300 MG/ML  SOLN COMPARISON:  CT abdomen dated 10/05/2012 FINDINGS: Lower chest: No acute abnormality. Hepatobiliary: No focal liver abnormality is seen. No gallstones, gallbladder wall thickening, or biliary dilatation. Pancreas: Unremarkable. No pancreatic ductal dilatation or surrounding inflammatory changes. Spleen: Normal in size without focal abnormality. Adrenals/Urinary Tract: Adrenal glands appear normal. Kidneys appear normal without mass, stone or hydronephrosis. No obstructing ureteral or bladder calculi are identified. Stomach/Bowel: Marked thickening of the walls of the sigmoid colon indicating acute diverticulitis. Multiple abscess collections extending inferior and anterior to the affected segment of the sigmoid colon. These abscess collections are potentially multiloculated but the major components appear to be contiguous from just above the level of the bladder to the anterior abdominal wall, overall measuring approximately 12 cm AP extent (series 3, image 70). The component of the abscess collection within the anterior abdominal wall measures 7.2 x 3.2 x 5.8 cm (craniocaudal by AP by transverse dimensions) (series 3, image 71; coronal series 6, image 18). The most inferior component of the abscess collection abuts the dome of the bladder, and there is thickening of the underlying bladder wall, suggesting a developing colovesical fistula (axial series 3, images 70 through 75). Extensive diverticulosis throughout the colon, but no other site of acute/subacute diverticulitis. No bowel  obstruction. Vascular/Lymphatic: No significant vascular findings are present. No enlarged abdominal or pelvic lymph nodes. Reproductive: Presumed hysterectomy.  No adnexal mass or free fluid. Other: No free intraperitoneal air identified. Musculoskeletal: No acute or suspicious osseous finding. IMPRESSION: 1. Acute or subacute diverticulitis of the sigmoid colon. 2. Multiple associated abscess collections extending inferior and anterior to the affected segment of the sigmoid colon. These abscess collections are potentially multiloculated but the major components appear to be contiguous from just above the level of the bladder to the anterior abdominal wall, overall measuring approximately 12 cm AP extent. The component of the abscess collection within the anterior abdominal wall measures 7.2 x 3.2 x 5.8 cm. The most inferior component of the abscess collection abuts the dome of the bladder, and there is thickening of the underlying bladder wall, suggesting a developing colovesical fistula. Given the distortion of the sigmoid colon loops/walls, I suspect colocolic fistulas as well. 3. Extensive diverticulosis. 4. No free intraperitoneal air identified. Electronically Signed   By: Bary Richard M.D.   On: 11/02/2019 14:21    Procedures Procedures (including critical care time)  Medications Ordered in ED Medications  ALPRAZolam (XANAX) tablet 0.5 mg (has no administration in time range)  hyoscyamine (ANASPAZ) disintergrating tablet 0.125 mg (has no administration in time range)  promethazine (PHENERGAN) tablet 25 mg (has no administration in time range)  enoxaparin (LOVENOX) injection 40 mg (has no administration in time range)  acetaminophen (TYLENOL) tablet 650 mg (has no administration in time range)    Or  acetaminophen (TYLENOL) suppository 650 mg (has no administration in time range)  ketorolac (TORADOL) 30 MG/ML injection 30 mg (30 mg Intravenous Given 11/02/19 1840)  HYDROmorphone (DILAUDID)  injection 0.5-1 mg (has no administration in time range)  ondansetron (ZOFRAN) tablet 4 mg ( Oral See Alternative 11/02/19 1840)    Or  ondansetron (ZOFRAN) injection 4 mg (4 mg Intravenous Given 11/02/19 1840)  metroNIDAZOLE (FLAGYL) IVPB 500 mg (500 mg Intravenous New Bag/Given 11/02/19 2057)  0.9 %  sodium chloride infusion ( Intravenous New Bag/Given 11/02/19 2057)  hydrALAZINE (APRESOLINE) tablet 25 mg (has no administration in time range)  lactated ringers bolus 1,000 mL (0 mLs Intravenous Stopped 11/02/19 2056)  iohexol (OMNIPAQUE) 300 MG/ML solution 100 mL (100 mLs Intravenous Contrast Given 11/02/19 1337)  cefTRIAXone (ROCEPHIN) 1 g in sodium chloride 0.9 % 100 mL IVPB (0 g Intravenous Stopped 11/02/19 1643)  metroNIDAZOLE (FLAGYL) IVPB 500 mg (0 mg Intravenous Stopped 11/02/19 1753)  lactated ringers bolus 1,000 mL (0 mLs Intravenous Stopped 11/02/19 1607)    ED Course  I have reviewed the triage vital signs and the nursing notes.  Pertinent labs & imaging results that were available during my care of the patient were reviewed by me and considered in my medical decision making (see chart for details).    MDM Rules/Calculators/A&P                          Evaluation for concerning suprapubic painful mass.  No fevers chills.  Normal bowel function normal urinary function.  Eating well.  No fevers chills.  History of partial hysterectomy 15 years ago but otherwise unremarkable surgical history.  There is tender to palpation does not feel fluid-filled and it is firm.  We will get a CT scan to evaluate and baseline labs.  Laboratory studies that were evaluated from first look process show a leukocytosis and metabolic acidosis that is anion gap.  Lactic acid is added.  Urine studies without signs of infection.  IV fluids will be given as well.  Mild lactic acidosis. Heart rate is improving with IV hydration. Blood cultures will be sent. IV antibiotics will be given ceftriaxone and Flagyl. CT imaging is  reviewed by radiology myself shows multiple large abscesses, the mass palpated on the abdominal wall is likely abscess stemming from diverticular disease. There is concern for bowel bowel fistula as well as bowel bladder fistula. She will need IV antibiotics and admission possible interventional radiology, possible surgery. I spoke with the surgeon on call. The surgeon recommends admission to medicine with interventional radiology consultation. They will be available as needed. With the patient about this and she agrees to admission however she refuses blood products as she is Jehov[employer]'s Witness. And she wishes to be DNR full scope of treatment, meaning she does not want chest compressions if her heart were to stop but she is willing to take any medication to prevent her from cardiac arrest.  The medicine team is agreed to admit this patient.  IV antibiotics were given.  Surgery was consulted.  Patient remained stable in our care.  The patient will be admitted to the hospitalist.  For the remainder this patient's care please see inpatient team notes.  I will intervene as needed while the patient remains in the emergency department.   Final Clinical Impression(s) / ED Diagnoses Final diagnoses:  Diverticulitis  Abdominal wall abscess    Rx / DC Orders ED Discharge Orders    None       Sabino Donovan, MD 11/02/19 2110

## 2019-11-02 NOTE — ED Triage Notes (Signed)
Patient noticed a lump at lower abdomen 2 days ago , denies emesis or diarrhea , no pain , denies fever or chills .

## 2019-11-02 NOTE — Consult Note (Signed)
Reason for Consult/Chief Complaint:  Consultant: Myrtis Ser, MD  [name] is an 54 y.o. female.   HPI: 19F with a 3-4 year history of diverticulitis, reports episodes every 1-3 months, all treated with antibiotics. Most recent episode began 3-4 weeks ago and took abx x2 weeks. In the last four days, noticed some abdominal swelling of the lower abdomen with associated lower abdominal pain. Has not noticed an increase in size, although family at bedside thinks it has gotten larger. This is what prompted presentation to the ED. Denies nausea, vomiting. Reports bowels have changed in color, now green, due to iron tablets, but otherwise are soft and regular, daily. Denies fevers/chills, but endorses urinary frequency that has been present since the initial onset of these symptoms, without dysuria, pyuria, or foul odor. Was never referred to a surgeon or gastroenterologist. Has never had a colonoscopy due to multiple episodes of diverticulitis. No personal or family history of ulcerative colitis, Crohns, or colon cancer.   Past Medical History:  Diagnosis Date  . Diverticulitis   . Hypertension     Past Surgical History:  Procedure Laterality Date  . ABDOMINAL HYSTERECTOMY      No family history on file.  Social History:  reports that she has never smoked. She has never used smokeless tobacco. She reports that she does not drink alcohol and does not use drugs.  Allergies: No Known Allergies  Medications: I have reviewed the patient's current medications.  Results for orders placed or performed during the hospital encounter of 11/02/19 (from the past 48 hour(s))  CBC with Differential     Status: Abnormal   Collection Time: 11/02/19  6:17 AM  Result Value Ref Range   WBC 18.6 (H) 4.0 - 10.5 K/uL   RBC 4.34 3.87 - 5.11 MIL/uL   Hemoglobin 9.9 (L) 12.0 - 15.0 g/dL   HCT 14.9 (L) 36 - 46 %   MCV 72.1 (L) 80.0 - 100.0 fL   MCH 22.8 (L) 26.0 - 34.0 pg   MCHC 31.6 30.0 - 36.0 g/dL    RDW 70.2 (H) 63.7 - 15.5 %   Platelets 435 (H) 150 - 400 K/uL   nRBC 0.0 0.0 - 0.2 %   Neutrophils Relative % 77 %   Neutro Abs 14.3 (H) 1.7 - 7.7 K/uL   Lymphocytes Relative 11 %   Lymphs Abs 2.0 0.7 - 4.0 K/uL   Monocytes Relative 11 %   Monocytes Absolute 2.1 (H) 0 - 1 K/uL   Eosinophils Relative 0 %   Eosinophils Absolute 0.0 0 - 0 K/uL   Basophils Relative 0 %   Basophils Absolute 0.1 0 - 0 K/uL   Immature Granulocytes 1 %   Abs Immature Granulocytes 0.13 (H) 0.00 - 0.07 K/uL    Comment: Performed at [facility], [address] [place], [place] [address]  Comprehensive metabolic panel     Status: Abnormal   Collection Time: 11/02/19  6:17 AM  Result Value Ref Range   Sodium 132 (L) 135 - 145 mmol/L   Potassium 3.1 (L) 3.5 - 5.1 mmol/L   Chloride 98 98 - 111 mmol/L   CO2 17 (L) 22 - 32 mmol/L   Glucose, Bld 106 (H) 70 - 99 mg/dL    Comment: Glucose reference range applies only to samples taken after fasting for at least 8 hours.   BUN 7 6 - 20 mg/dL   Creatinine, Ser 0.27 0.44 - 1.00 mg/dL   Calcium 8.7 (  L) 8.9 - 10.3 mg/dL   Total Protein 7.7 6.5 - 8.1 g/dL   Albumin 2.6 (L) 3.5 - 5.0 g/dL   AST 17 15 - 41 U/L   ALT 13 0 - 44 U/L   Alkaline Phosphatase 72 38 - 126 U/L   Total Bilirubin 1.7 (H) 0.3 - 1.2 mg/dL   GFR calc non Af Amer >60 >60 mL/min   GFR calc Af Amer >60 >60 mL/min   Anion gap 17 (H) 5 - 15    Comment: Performed at [facility], [address] [place], [place] [address]  Urinalysis, Routine w reflex microscopic     Status: Abnormal   Collection Time: 11/02/19  6:30 AM  Result Value Ref Range   Color, Urine AMBER (A) YELLOW    Comment: BIOCHEMICALS MAY BE AFFECTED BY COLOR   APPearance HAZY (A) CLEAR   Specific Gravity, Urine 1.021 1.005 - 1.030   pH 5.0 5.0 - 8.0   Glucose, UA NEGATIVE NEGATIVE mg/dL   Hgb urine dipstick SMALL (A) NEGATIVE   Bilirubin Urine NEGATIVE NEGATIVE   Ketones, ur 80 (A) NEGATIVE mg/dL   Protein, ur 604 (A)  NEGATIVE mg/dL   Nitrite NEGATIVE NEGATIVE   Leukocytes,Ua NEGATIVE NEGATIVE   RBC / HPF 0-5 0 - 5 RBC/hpf   WBC, UA 0-5 0 - 5 WBC/hpf   Bacteria, UA NONE SEEN NONE SEEN   Squamous Epithelial / LPF 0-5 0 - 5   Mucus PRESENT     Comment: Performed at [facility], [address], [place], [place] [address]  Lactic acid, plasma     Status: Abnormal   Collection Time: 11/02/19  2:12 PM  Result Value Ref Range   Lactic Acid, Venous 2.2 (HH) 0.5 - 1.9 mmol/L    Comment: CRITICAL RESULT CALLED TO, READ BACK BY AND VERIFIED WITH: S.WILSON RN @ 1504 11/02/2019 BY C.EDENS Performed at [facility], [address], [place], [place] [address]     CT ABDOMEN PELVIS W CONTRAST  Result Date: 11/02/2019 CLINICAL DATA:  Abdominal pain. EXAM: CT ABDOMEN AND PELVIS WITH CONTRAST TECHNIQUE: Multidetector CT imaging of the abdomen and pelvis was performed using the standard protocol following bolus administration of intravenous contrast. CONTRAST:  OMNIPAQUE IOHEXOL 300 MG/ML  SOLN COMPARISON:  CT abdomen dated 10/05/2012 FINDINGS: Lower chest: No acute abnormality. Hepatobiliary: No focal liver abnormality is seen. No gallstones, gallbladder wall thickening, or biliary dilatation. Pancreas: Unremarkable. No pancreatic ductal dilatation or surrounding inflammatory changes. Spleen: Normal in size without focal abnormality. Adrenals/Urinary Tract: Adrenal glands appear normal. Kidneys appear normal without mass, stone or hydronephrosis. No obstructing ureteral or bladder calculi are identified. Stomach/Bowel: Marked thickening of the walls of the sigmoid colon indicating acute diverticulitis. Multiple abscess collections extending inferior and anterior to the affected segment of the sigmoid colon. These abscess collections are potentially multiloculated but the major components appear to be contiguous from just above the level of the bladder to the anterior abdominal wall, overall measuring  approximately 12 cm AP extent (series 3, image 70). The component of the abscess collection within the anterior abdominal wall measures 7.2 x 3.2 x 5.8 cm (craniocaudal by AP by transverse dimensions) (series 3, image 71; coronal series 6, image 18). The most inferior component of the abscess collection abuts the dome of the bladder, and there is thickening of the underlying bladder wall, suggesting a developing colovesical fistula (axial series 3, images 70 through 75). Extensive diverticulosis  throughout the colon, but no other site of acute/subacute diverticulitis. No bowel obstruction. Vascular/Lymphatic: No significant vascular findings are present. No enlarged abdominal or pelvic lymph nodes. Reproductive: Presumed hysterectomy.  No adnexal mass or free fluid. Other: No free intraperitoneal air identified. Musculoskeletal: No acute or suspicious osseous finding. IMPRESSION: 1. Acute or subacute diverticulitis of the sigmoid colon. 2. Multiple associated abscess collections extending inferior and anterior to the affected segment of the sigmoid colon. These abscess collections are potentially multiloculated but the major components appear to be contiguous from just above the level of the bladder to the anterior abdominal wall, overall measuring approximately 12 cm AP extent. The component of the abscess collection within the anterior abdominal wall measures 7.2 x 3.2 x 5.8 cm. The most inferior component of the abscess collection abuts the dome of the bladder, and there is thickening of the underlying bladder wall, suggesting a developing colovesical fistula. Given the distortion of the sigmoid colon loops/walls, I suspect colocolic fistulas as well. 3. Extensive diverticulosis. 4. No free intraperitoneal air identified. Electronically Signed   By: Bary Richard M.D.   On: 11/02/2019 14:21    ROS 10 point review of systems is negative except as listed above in HPI.   Physical Exam Blood pressure (!)  100/48, pulse (!) 120, temperature 99 F (37.2 C), temperature source Oral, resp. rate 18, height 5\' 6"  (1.676 m), weight 90 kg, SpO2 99 %. Constitutional: well-developed, well-nourished HEENT: pupils equal, round, reactive to light, 64mm b/l, moist conjunctiva, external inspection of ears and nose normal, hearing intact Oropharynx: normal oropharyngeal mucosa, normal dentition Neck: no thyromegaly, trachea midline, no midline cervical tenderness to palpation Chest: breath sounds equal bilaterally, normal respiratory effort, no midline or lateral chest wall tenderness to palpation/deformity Abdomen: soft, tenderness of the lower abdomen with palpable firm mass in the midline lower abdomen ~10x10cm, mass is tender and warm, without erythema or skin changes, no bruising, no hepatosplenomegaly GU: normal female genitalia  Back: no wounds, no thoracic/lumbar spine tenderness to palpation, no thoracic/lumbar spine stepoffs Rectal: deferred Extremities: 2+ radial and pedal pulses bilaterally, motor and sensation intact to bilateral UE and LE, no peripheral edema MSK: normal gait/station, no clubbing/cyanosis of fingers/toes, normal ROM of all four extremities Skin: warm, dry, no rashes Psych: normal memory, normal mood/affect    Assessment/Plan: 34F with complicated diverticulitis.  Multiple abscesses, ?loculation - recommend abx, IR drainage. Will try to avoid surgical intervention, as this would result in colostomy creation. Will need colonoscopy after resolution of this acute episode and outpatient follow up with Surgery for sigmoid colectomy.  Developing colovesical fistula - continue to monitor clinically and treat symptomatically. At the time of surgical intervention, will need imaging evaluation to confirm/deny presence of CV fistula.  FEN - okay for CLD tonight, NPO at midnight for IR drain placement DVT - hold any ppx/tx AC for now   3m, MD General and Trauma  Surgery [facility] Surgery

## 2019-11-02 NOTE — H&P (Signed)
History and Physical    [name] RUE:[id number] DOB: Apr 17, 1965 DOA: 11/02/2019  PCP: Soundra Pilon, FNP (Confirm with patient/family/NH records and if not entered, this has to be entered at [facility] point of entry) Patient coming from: Home  I have personally briefly reviewed patient's old medical records in [facility]  Chief Complaint: Abd pain and lump  HPI: [name] is a 54 y.o. female with medical history significant of hypertension, recurrent diverticulitis, presented with lower abdominal pain and lump.  Patient has had multiple episodes of diverticulitis in the past 2 to 3 years and has been managed by her PCP with p.o. antibiotics.  She told me that she was never referred to see any GI or surgeon to discuss about colonoscopy or partial colectomy.  Most recent episode with 3 weeks ago, when she was treated with Cipro and Flagyl for about 2 weeks. Her symptoms was improved for about one week then, over past 2-3 days, she started to have cramping like lower abdominal pain, and she started to feel a lump growing in size in the last 2 days with tenderness.  No diarrhea no urinary symptoms, any fever chills no nauseous vomit. ED Course: WBC 18.6, lactic acid 2.2, CT abdominal with contrast: Marked thickening of the walls of the sigmoid colon indicating acute diverticulitis. Multiple abscess collections extending inferior and anterior to the affected segment of the sigmoid colon. These abscess collections are potentially multiloculated but the major components appear to be contiguous from just above the level of the bladder to the anterior abdominal wall, overall measuring approximately 12 cm AP extent (series 3, image 70). The component of the abscess collection within the anterior abdominal wall measures 7.2 x 3.2 x 5.8 cm (craniocaudal by AP by transverse dimensions) (series 3, image 71; coronal series 6, image 18).  Review of Systems: As per HPI otherwise 10 point review of  systems negative.    Past Medical History:  Diagnosis Date  . Diverticulitis   . Hypertension     Past Surgical History:  Procedure Laterality Date  . ABDOMINAL HYSTERECTOMY       reports that she has never smoked. She has never used smokeless tobacco. She reports that she does not drink alcohol and does not use drugs.  No Known Allergies  FH: HTN in parents  Prior to Admission medications   Medication Sig Start Date End Date Taking? Authorizing Provider  ALPRAZolam Prudy Feeler) 0.5 MG tablet Take 0.5 mg by mouth 3 (three) times daily as needed for anxiety.   Yes [provider]  hyoscyamine (LEVSIN) 0.125 MG tablet Take 0.125 mg by mouth every 6 (six) hours as needed for bladder spasms or cramping.  10/21/19  Yes [provider]  losartan-hydrochlorothiazide (HYZAAR) 50-12.5 MG per tablet Take 1 tablet by mouth daily.   Yes [provider]  promethazine (PHENERGAN) 25 MG tablet Take 25 mg by mouth 2 (two) times daily as needed for nausea or vomiting.  10/21/19  Yes [provider]  ciprofloxacin (CIPRO) 500 MG tablet Take 1 tablet (500 mg total) by mouth 2 (two) times daily. One po bid x 7 days Patient not taking: Reported on 11/02/2019 10/05/12   Doug Sou, MD  metroNIDAZOLE (FLAGYL) 500 MG tablet Take 1 tablet (500 mg total) by mouth 3 (three) times daily. Patient not taking: Reported on 11/02/2019 10/05/12   Doug Sou, MD  ondansetron (ZOFRAN) 8 MG tablet Take 1 tablet (8 mg total) by mouth every 8 (eight) hours  as needed for nausea. Patient not taking: Reported on 11/02/2019 10/05/12   Doug Sou, MD    Physical Exam: Vitals:   11/02/19 0558 11/02/19 0906 11/02/19 1131 11/02/19 1504  BP:  (!) 144/77 (!) 133/92 (!) 100/48  Pulse:  (!) 138 (!) 135 (!) 120  Resp:  17 15 18   Temp:      TempSrc:      SpO2:  96% 99% 99%  Weight: 90 kg     Height: 5\' 6"  (1.676 m)       Constitutional: NAD, calm, comfortable Vitals:   11/02/19 0558  11/02/19 0906 11/02/19 1131 11/02/19 1504  BP:  (!) 144/77 (!) 133/92 (!) 100/48  Pulse:  (!) 138 (!) 135 (!) 120  Resp:  17 15 18   Temp:      TempSrc:      SpO2:  96% 99% 99%  Weight: 90 kg     Height: 5\' 6"  (1.676 m)      Eyes: PERRL, lids and conjunctivae normal ENMT: Mucous membranes are moist. Posterior pharynx clear of any exudate or lesions.Normal dentition.  Neck: normal, supple, no masses, no thyromegaly Respiratory: clear to auscultation bilaterally, no wheezing, no crackles. Normal respiratory effort. No accessory muscle use.  Cardiovascular: Regular rate and rhythm, no murmurs / rubs / gallops. No extremity edema. 2+ pedal pulses. No carotid bruits.  Abdomen: Hard oval shape mass on lower abdomen with severe tenderness, measuring 8 x 5 cm. No hepatosplenomegaly. Bowel sounds positive.  Musculoskeletal: no clubbing / cyanosis. No joint deformity upper and lower extremities. Good ROM, no contractures. Normal muscle tone.  Skin: no rashes, lesions, ulcers. No induration Neurologic: CN 2-12 grossly intact. Sensation intact, DTR normal. Strength 5/5 in all 4.  Psychiatric: Normal judgment and insight. Alert and oriented x 3. Normal mood.     Labs on Admission: I have personally reviewed following labs and imaging studies  CBC: Recent Labs  Lab 11/02/19 0617  WBC 18.6*  NEUTROABS 14.3*  HGB 9.9*  HCT 31.3*  MCV 72.1*  PLT 435*   Basic Metabolic Panel: Recent Labs  Lab 11/02/19 0617  NA 132*  K 3.1*  CL 98  CO2 17*  GLUCOSE 106*  BUN 7  CREATININE 0.80  CALCIUM 8.7*   GFR: Estimated Creatinine Clearance: 89.8 mL/min (by C-G formula based on SCr of 0.8 mg/dL). Liver Function Tests: Recent Labs  Lab 11/02/19 0617  AST 17  ALT 13  ALKPHOS 72  BILITOT 1.7*  PROT 7.7  ALBUMIN 2.6*   No results for input(s): LIPASE, AMYLASE in the last 168 hours. No results for input(s): AMMONIA in the last 168 hours. Coagulation Profile: No results for input(s): INR,  PROTIME in the last 168 hours. Cardiac Enzymes: No results for input(s): CKTOTAL, CKMB, CKMBINDEX, TROPONINI in the last 168 hours. BNP (last 3 results) No results for input(s): PROBNP in the last 8760 hours. HbA1C: No results for input(s): HGBA1C in the last 72 hours. CBG: No results for input(s): GLUCAP in the last 168 hours. Lipid Profile: No results for input(s): CHOL, HDL, LDLCALC, TRIG, CHOLHDL, LDLDIRECT in the last 72 hours. Thyroid Function Tests: No results for input(s): TSH, T4TOTAL, FREET4, T3FREE, THYROIDAB in the last 72 hours. Anemia Panel: No results for input(s): VITAMINB12, FOLATE, FERRITIN, TIBC, IRON, RETICCTPCT in the last 72 hours. Urine analysis:    Component Value Date/Time   COLORURINE AMBER (A) 11/02/2019 0630   APPEARANCEUR HAZY (A) 11/02/2019 0630   LABSPEC 1.021 11/02/2019 0630  PHURINE 5.0 11/02/2019 0630   GLUCOSEU NEGATIVE 11/02/2019 0630   HGBUR SMALL (A) 11/02/2019 0630   BILIRUBINUR NEGATIVE 11/02/2019 0630   KETONESUR 80 (A) 11/02/2019 0630   PROTEINUR 100 (A) 11/02/2019 0630   UROBILINOGEN 0.2 10/05/2012 1010   NITRITE NEGATIVE 11/02/2019 0630   LEUKOCYTESUR NEGATIVE 11/02/2019 0630    Radiological Exams on Admission: CT ABDOMEN PELVIS W CONTRAST  Result Date: 11/02/2019 CLINICAL DATA:  Abdominal pain. EXAM: CT ABDOMEN AND PELVIS WITH CONTRAST TECHNIQUE: Multidetector CT imaging of the abdomen and pelvis was performed using the standard protocol following bolus administration of intravenous contrast. CONTRAST:  OMNIPAQUE IOHEXOL 300 MG/ML  SOLN COMPARISON:  CT abdomen dated 10/05/2012 FINDINGS: Lower chest: No acute abnormality. Hepatobiliary: No focal liver abnormality is seen. No gallstones, gallbladder wall thickening, or biliary dilatation. Pancreas: Unremarkable. No pancreatic ductal dilatation or surrounding inflammatory changes. Spleen: Normal in size without focal abnormality. Adrenals/Urinary Tract: Adrenal glands appear normal.  Kidneys appear normal without mass, stone or hydronephrosis. No obstructing ureteral or bladder calculi are identified. Stomach/Bowel: Marked thickening of the walls of the sigmoid colon indicating acute diverticulitis. Multiple abscess collections extending inferior and anterior to the affected segment of the sigmoid colon. These abscess collections are potentially multiloculated but the major components appear to be contiguous from just above the level of the bladder to the anterior abdominal wall, overall measuring approximately 12 cm AP extent (series 3, image 70). The component of the abscess collection within the anterior abdominal wall measures 7.2 x 3.2 x 5.8 cm (craniocaudal by AP by transverse dimensions) (series 3, image 71; coronal series 6, image 18). The most inferior component of the abscess collection abuts the dome of the bladder, and there is thickening of the underlying bladder wall, suggesting a developing colovesical fistula (axial series 3, images 70 through 75). Extensive diverticulosis throughout the colon, but no other site of acute/subacute diverticulitis. No bowel obstruction. Vascular/Lymphatic: No significant vascular findings are present. No enlarged abdominal or pelvic lymph nodes. Reproductive: Presumed hysterectomy.  No adnexal mass or free fluid. Other: No free intraperitoneal air identified. Musculoskeletal: No acute or suspicious osseous finding. IMPRESSION: 1. Acute or subacute diverticulitis of the sigmoid colon. 2. Multiple associated abscess collections extending inferior and anterior to the affected segment of the sigmoid colon. These abscess collections are potentially multiloculated but the major components appear to be contiguous from just above the level of the bladder to the anterior abdominal wall, overall measuring approximately 12 cm AP extent. The component of the abscess collection within the anterior abdominal wall measures 7.2 x 3.2 x 5.8 cm. The most inferior  component of the abscess collection abuts the dome of the bladder, and there is thickening of the underlying bladder wall, suggesting a developing colovesical fistula. Given the distortion of the sigmoid colon loops/walls, I suspect colocolic fistulas as well. 3. Extensive diverticulosis. 4. No free intraperitoneal air identified. Electronically Signed   By: Bary Richard M.D.   On: 11/02/2019 14:21    EKG: None  Assessment/Plan Active Problems:   Diverticulitis   Intra-abdominal abscess (HCC)  (please populate well all problems here in Problem List. (For example, if patient is on BP meds at home and you resume or decide to hold them, it is a problem that needs to be her. Same for CAD, COPD, HLD and so on)  Perforated recurrent diverticulitis with intra-abdominal abscess, failed outpatient antibiotic treatment -With tachycardia and elevated lactic acid, suspect impending sepsis. -Case was discussed with on-call surgeon, recommend  IR guided drainage and IV antibiotics -Discussed with patient regarding partial colectomy, patient has had multiple episodes of recurrent ductulitis will defer that decision to surgeon.  HTN -Hold HCTZ and ACEI, start as needed hydralazine -Start IV fluid  Hypokalemia -Replace and recheck  Hyponatremia -Likely from HCTZ, on hold for now  DVT prophylaxis: Lovenox  code Status: Full Family Communication: Sister at bedside Disposition Plan: Patient has central abdominal abscess, will need drainage, likely will need more than 2 midnight hospital stay for IV antibiotics and IR procedure Consults called: IR and general surgery Admission status: MedSurg admission   Emeline General MD [facility] Pager [phone]  11/02/2019, 4:35 PM

## 2019-11-03 DIAGNOSIS — D72829 Elevated white blood cell count, unspecified: Secondary | ICD-10-CM

## 2019-11-03 DIAGNOSIS — I10 Essential (primary) hypertension: Secondary | ICD-10-CM

## 2019-11-03 LAB — BASIC METABOLIC PANEL WITH GFR
Anion gap: 9 (ref 5–15)
BUN: 9 mg/dL (ref 6–20)
CO2: 23 mmol/L (ref 22–32)
Calcium: 7.9 mg/dL — ABNORMAL LOW (ref 8.9–10.3)
Chloride: 101 mmol/L (ref 98–111)
Creatinine, Ser: 0.63 mg/dL (ref 0.44–1.00)
GFR calc Af Amer: >60 mL/min
GFR calc non Af Amer: >60 mL/min
Glucose, Bld: 107 mg/dL — ABNORMAL HIGH (ref 70–99)
Potassium: 3 mmol/L — ABNORMAL LOW (ref 3.5–5.1)
Sodium: 133 mmol/L — ABNORMAL LOW (ref 135–145)

## 2019-11-03 LAB — CBC
HCT: 24.6 % — ABNORMAL LOW (ref 36.0–46.0)
Hemoglobin: 8.4 g/dL — ABNORMAL LOW (ref 12.0–15.0)
MCH: 23.7 pg — ABNORMAL LOW (ref 26.0–34.0)
MCHC: 34.1 g/dL (ref 30.0–36.0)
MCV: 69.5 fL — ABNORMAL LOW (ref 80.0–100.0)
Platelets: 328 10*3/uL (ref 150–400)
RBC: 3.54 MIL/uL — ABNORMAL LOW (ref 3.87–5.11)
RDW: 16.7 % — ABNORMAL HIGH (ref 11.5–15.5)
WBC: 12.6 10*3/uL — ABNORMAL HIGH (ref 4.0–10.5)
nRBC: 0 % (ref 0.0–0.2)

## 2019-11-03 LAB — LACTIC ACID, PLASMA: Lactic Acid, Venous: 1 mmol/L (ref 0.5–1.9)

## 2019-11-03 MED ORDER — PIPERACILLIN-TAZOBACTAM 3.375 G IVPB
3.3750 g | Freq: Three times a day (TID) | INTRAVENOUS | Status: DC
  Administered 2019-11-03 – 2019-11-10 (×22): 3.375 g via INTRAVENOUS
  Filled 2019-11-03 (×24): qty 50

## 2019-11-03 MED ORDER — SODIUM CHLORIDE 0.9 % IV BOLUS
1000.0000 mL | Freq: Once | INTRAVENOUS | Status: AC
  Administered 2019-11-03: 1000 mL via INTRAVENOUS

## 2019-11-03 MED ORDER — MUPIROCIN 2 % EX OINT: 1.0000 "application " | TOPICAL_OINTMENT | Freq: Two times a day (BID) | CUTANEOUS | Status: DC

## 2019-11-03 MED ORDER — POTASSIUM CHLORIDE IN NACL 40-0.9 MEQ/L-% IV SOLN
INTRAVENOUS | Status: DC
  Administered 2019-11-03: 100 mL/h via INTRAVENOUS
  Filled 2019-11-03 (×2): qty 1000

## 2019-11-03 MED ORDER — POTASSIUM CHLORIDE 10 MEQ/100ML IV SOLN
10.0000 meq | INTRAVENOUS | Status: AC
  Administered 2019-11-03 (×4): 10 meq via INTRAVENOUS
  Filled 2019-11-03: qty 100

## 2019-11-03 NOTE — Progress Notes (Signed)
   11/03/19 0336  Assess: MEWS Score  Temp (!) 102.5 F (39.2 C)  BP (!) 108/60  ECG Heart Rate (!) 125  Resp 18  SpO2 100 %  O2 Device Room Air  Assess: MEWS Score  MEWS Temp 2  MEWS Systolic 0  MEWS Pulse 2  MEWS RR 0  MEWS LOC 0  MEWS Score 4  MEWS Score Color Red  Assess: if the MEWS score is Yellow or Red  Were vital signs taken at a resting state? Yes  Focused Assessment No change from prior assessment  Early Detection of Sepsis Score *See Row Information* High  MEWS guidelines implemented *See Row Information* Yes  Treat  MEWS Interventions Other (Comment) (Tylenol given at 0201 and 1L NS bolus)  Pain Scale 0-10  Pain Score 0  Take Vital Signs  Increase Vital Sign Frequency  Red: Q 1hr X 4 then Q 4hr X 4, if remains red, continue Q 4hrs  Escalate  MEWS: Escalate Red: discuss with charge nurse/RN and provider, consider discussing with RRT  Notify: Charge Nurse/RN  Name of Charge Nurse/RN Notified Jill RN  Date Charge Nurse/RN Notified 11/03/19  Time Charge Nurse/RN Notified 0336  Notify: Rapid Response  Name of Rapid Response RN Notified  (Notified by RR nurse while on 6N)

## 2019-11-03 NOTE — H&P (Signed)
Chief Complaint: Patient was seen in consultation today for  Chief Complaint  Patient presents with  . Abdominal Lump    Referring Physician(s):  Supervising Physician: Ruel Favors  Patient Status: [facility] - In-pt  History of Present Illness: [name] is a 54 y.o. female with a past medical history that includes HTN and diverticulitis. She presented to the ED 11/02/19 with complaints of pain and swelling in her lower abdomen. ED work up included leukocytosis (WBC 18.6) and lactic acid of 2.2.   CT abdomen/pelvis 11/02/19: IMPRESSION: 1. Acute or subacute diverticulitis of the sigmoid colon. 2. Multiple associated abscess collections extending inferior and anterior to the affected segment of the sigmoid colon. These abscess collections are potentially multiloculated but the major components appear to be contiguous from just above the level of the bladder to the anterior abdominal wall, overall measuring approximately 12 cm AP extent. The component of the abscess collection within the anterior abdominal wall measures 7.2 x 3.2 x 5.8 cm. The most inferior component of the abscess collection abuts the dome of the bladder, and there is thickening of the underlying bladder wall, suggesting a developing colovesical fistula. Given the distortion of the sigmoid colon loops/walls, I suspect colocolic fistulas as well. 3. Extensive diverticulosis. 4. No free intraperitoneal air identified.  Interventional Radiology has been asked to evaluate this patient for an image-guided abdominal fluid collection aspiration with drain placement. This case was reviewed and the procedure approved by Dr. Miles Costain.   Past Medical History:  Diagnosis Date  . Diverticulitis   . Hypertension     Past Surgical History:  Procedure Laterality Date  . ABDOMINAL HYSTERECTOMY      Allergies: Patient has no known allergies.  Medications: Prior to Admission medications   Medication Sig Start Date  End Date Taking? Authorizing Provider  ALPRAZolam Prudy Feeler) 0.5 MG tablet Take 0.5 mg by mouth 3 (three) times daily as needed for anxiety.   Yes [provider]  hyoscyamine (LEVSIN) 0.125 MG tablet Take 0.125 mg by mouth every 6 (six) hours as needed for bladder spasms or cramping.  10/21/19  Yes [provider]  losartan-hydrochlorothiazide (HYZAAR) 50-12.5 MG per tablet Take 1 tablet by mouth daily.   Yes [provider]  promethazine (PHENERGAN) 25 MG tablet Take 25 mg by mouth 2 (two) times daily as needed for nausea or vomiting.  10/21/19  Yes [provider]  ciprofloxacin (CIPRO) 500 MG tablet Take 1 tablet (500 mg total) by mouth 2 (two) times daily. One po bid x 7 days Patient not taking: Reported on 11/02/2019 10/05/12   Doug Sou, MD  metroNIDAZOLE (FLAGYL) 500 MG tablet Take 1 tablet (500 mg total) by mouth 3 (three) times daily. Patient not taking: Reported on 11/02/2019 10/05/12   Doug Sou, MD  ondansetron (ZOFRAN) 8 MG tablet Take 1 tablet (8 mg total) by mouth every 8 (eight) hours as needed for nausea. Patient not taking: Reported on 11/02/2019 10/05/12   Doug Sou, MD     No family history on file.  Social History   Socioeconomic History  . Marital status: Single    Spouse name: Not on file  . Number of children: Not on file  . Years of education: Not on file  . Highest education level: Not on file  Occupational History  . Not on file  Tobacco Use  . Smoking status: Never Smoker  . Smokeless tobacco: Never Used  Substance and Sexual Activity  . Alcohol use: No  .  Drug use: No  . Sexual activity: Not on file  Other Topics Concern  . Not on file  Social History Narrative  . Not on file   Social Determinants of Health   Financial Resource Strain:   . Difficulty of Paying Living Expenses:   Food Insecurity:   . Worried About Programme researcher, broadcasting/film/video in the Last Year:   . Barista in the Last Year:   Transportation  Needs:   . Freight forwarder (Medical):   Marland Kitchen Lack of Transportation (Non-Medical):   Physical Activity:   . Days of Exercise per Week:   . Minutes of Exercise per Session:   Stress:   . Feeling of Stress :   Social Connections:   . Frequency of Communication with Friends and Family:   . Frequency of Social Gatherings with Friends and Family:   . Attends Religious Services:   . Active Member of Clubs or Organizations:   . Attends Banker Meetings:   Marland Kitchen Marital Status:     Review of Systems: A 12 point ROS discussed and pertinent positives are indicated in the HPI above.  All other systems are negative.  Review of Systems  Constitutional: Negative for appetite change and fatigue.  Respiratory: Negative for cough and shortness of breath.   Cardiovascular: Negative for chest pain and leg swelling.  Gastrointestinal: Positive for abdominal distention, abdominal pain and nausea. Negative for diarrhea and vomiting.  Musculoskeletal: Negative for back pain.  Neurological: Positive for headaches.    Vital Signs: BP 100/65 (BP Location: Left Arm)   Pulse (!) 125   Temp 99.4 F (37.4 C) (Oral)   Resp 18   Ht 5\' 6"  (1.676 m)   Wt (!) 204 lb (92.5 kg)   SpO2 100%   BMI 32.93 kg/m   Physical Exam Constitutional:      General: She is not in acute distress. HENT:     Mouth/Throat:     Mouth: Mucous membranes are moist.     Pharynx: Oropharynx is clear.  Cardiovascular:     Rate and Rhythm: Regular rhythm. Tachycardia present.     Pulses: Normal pulses.     Heart sounds: Normal heart sounds.  Pulmonary:     Effort: Pulmonary effort is normal.     Breath sounds: Normal breath sounds.  Abdominal:     General: Bowel sounds are increased. There is distension.     Tenderness: There is abdominal tenderness.     Comments: Suprapubic pain and distention  Musculoskeletal:        General: Normal range of motion.     Cervical back: Normal range of motion.  Skin:     General: Skin is warm and dry.  Neurological:     Mental Status: She is alert and oriented to person, place, and time.     Imaging: CT ABDOMEN PELVIS W CONTRAST  Result Date: 11/02/2019 CLINICAL DATA:  Abdominal pain. EXAM: CT ABDOMEN AND PELVIS WITH CONTRAST TECHNIQUE: Multidetector CT imaging of the abdomen and pelvis was performed using the standard protocol following bolus administration of intravenous contrast. CONTRAST:  OMNIPAQUE IOHEXOL 300 MG/ML  SOLN COMPARISON:  CT abdomen dated 10/05/2012 FINDINGS: Lower chest: No acute abnormality. Hepatobiliary: No focal liver abnormality is seen. No gallstones, gallbladder wall thickening, or biliary dilatation. Pancreas: Unremarkable. No pancreatic ductal dilatation or surrounding inflammatory changes. Spleen: Normal in size without focal abnormality. Adrenals/Urinary Tract: Adrenal glands appear normal. Kidneys appear normal without mass, stone  or hydronephrosis. No obstructing ureteral or bladder calculi are identified. Stomach/Bowel: Marked thickening of the walls of the sigmoid colon indicating acute diverticulitis. Multiple abscess collections extending inferior and anterior to the affected segment of the sigmoid colon. These abscess collections are potentially multiloculated but the major components appear to be contiguous from just above the level of the bladder to the anterior abdominal wall, overall measuring approximately 12 cm AP extent (series 3, image 70). The component of the abscess collection within the anterior abdominal wall measures 7.2 x 3.2 x 5.8 cm (craniocaudal by AP by transverse dimensions) (series 3, image 71; coronal series 6, image 18). The most inferior component of the abscess collection abuts the dome of the bladder, and there is thickening of the underlying bladder wall, suggesting a developing colovesical fistula (axial series 3, images 70 through 75). Extensive diverticulosis throughout the colon, but no other site of  acute/subacute diverticulitis. No bowel obstruction. Vascular/Lymphatic: No significant vascular findings are present. No enlarged abdominal or pelvic lymph nodes. Reproductive: Presumed hysterectomy.  No adnexal mass or free fluid. Other: No free intraperitoneal air identified. Musculoskeletal: No acute or suspicious osseous finding. IMPRESSION: 1. Acute or subacute diverticulitis of the sigmoid colon. 2. Multiple associated abscess collections extending inferior and anterior to the affected segment of the sigmoid colon. These abscess collections are potentially multiloculated but the major components appear to be contiguous from just above the level of the bladder to the anterior abdominal wall, overall measuring approximately 12 cm AP extent. The component of the abscess collection within the anterior abdominal wall measures 7.2 x 3.2 x 5.8 cm. The most inferior component of the abscess collection abuts the dome of the bladder, and there is thickening of the underlying bladder wall, suggesting a developing colovesical fistula. Given the distortion of the sigmoid colon loops/walls, I suspect colocolic fistulas as well. 3. Extensive diverticulosis. 4. No free intraperitoneal air identified. Electronically Signed   By: Bary Richard M.D.   On: 11/02/2019 14:21    Labs:  CBC: Recent Labs    11/02/19 0617 11/03/19 0352  WBC 18.6* 12.6*  HGB 9.9* 8.4*  HCT 31.3* 24.6*  PLT 435* 328    COAGS: No results for input(s): INR, APTT in the last 8760 hours.  BMP: Recent Labs    11/02/19 0617 11/03/19 0352  NA 132* 133*  K 3.1* 3.0*  CL 98 101  CO2 17* 23  GLUCOSE 106* 107*  BUN 7 9  CALCIUM 8.7* 7.9*  CREATININE 0.80 0.63  GFRNONAA >60 >60  GFRAA >60 >60    LIVER FUNCTION TESTS: Recent Labs    11/02/19 0617  BILITOT 1.7*  AST 17  ALT 13  ALKPHOS 72  PROT 7.7  ALBUMIN 2.6*    TUMOR MARKERS: No results for input(s): AFPTM, CEA, CA199, CHROMGRNA in the last 8760  hours.  Assessment and Plan:  Lower abdominal fluid collections: Angeni Chaudhuri, 54 year old female, is scheduled to be seen in Interventional Radiology tomorrow, 11/04/19, for an image-guided abdominal fluid collection aspiration with drain placement. She presented to the ED 11/02/19 with lower abdominal pain and swelling. CT-imaging was positive for multiple lower abdominal fluid collections with suspected colocolic and colovesical fistulas.  Risks and benefits discussed with the patient and her family including bleeding, infection, damage to adjacent structures, bowel perforation/fistula connection, and sepsis.  The patient will be  NPO after midnight, AM labs ordered.   All of the patient's questions were answered, patient is agreeable to proceed.  Consent signed  and in chart.  Thank you for this interesting consult.  I greatly enjoyed meeting [name] and look forward to participating in their care.  A copy of this report was sent to the requesting provider on this date.  Electronically Signed: Alwyn Ren, AGACNP-BC [phone] 11/03/2019, 10:40 AM   I spent a total of 20 Minutes in face to face in clinical consultation, greater than 50% of which was counseling/coordinating care for image-guided abdominal fluid collection aspiration with drain placement.

## 2019-11-03 NOTE — Progress Notes (Signed)
Patient ID: [name], female   DOB: 23-Feb-1966, 54 y.o.   MRN: [id number]  PROGRESS NOTE    [name]  VZC:[id number] DOB: 1965/08/12 DOA: 11/02/2019 PCP: Soundra Pilon, FNP   Brief Narrative:  .-year-old female with history of hypertension, recurrent diverticulitis presented with abdominal pain.  On presentation, WBCs 18.6.  CT abdomen with contrast showed acute diverticulitis with multiple abscesses.  She was started on antibiotics.  General surgery was consulted.  Assessment & Plan:   Acute recurrent diverticulitis with multiple intra-abdominal abscesses Developing colovesical fistula -CT abdomen on presentation showed acute diverticulitis with multiple abscesses with concern for developing colovesical fistula -General surgery has been consulted and recommend IR for possible drain placement.  IR has been consulted as well. -Continue Zosyn.  Follow cultures.  Continue IV fluids and n.p.o. for now.  Pain management.  Leukocytosis -Improving.  Hyponatremia -HCTZ on hold.  Continue IV fluids.  Monitor  Hypokalemia -Replace.  Repeat a.m. labs  Hypertension -HCTZ and ACE inhibitor held.  Continue as needed hydralazine.  DVT prophylaxis: Lovenox in case patient needs surgical intervention.  Start SCDs Code Status: Full Family Communication: Patient at bedside Disposition Plan: Status is: Inpatient  Remains inpatient appropriate because:Inpatient level of care appropriate due to severity of illness   Dispo: The patient is from: Home              Anticipated d/c is to: Home              Anticipated d/c date is: > 3 days              Patient currently is not medically stable to d/c.   Consultants: General surgery/IR  Procedures: None  Antimicrobials:  Anti-infectives (From admission, onward)   Start     Dose/Rate Route Frequency Ordered Stop   11/03/19 0400  piperacillin-tazobactam (ZOSYN) IVPB 3.375 g     Discontinue     3.375 g 12.5 mL/hr over 240 Minutes  Intravenous Every 8 hours 11/03/19 0240     11/02/19 2000  metroNIDAZOLE (FLAGYL) IVPB 500 mg  Status:  Discontinued        500 mg 100 mL/hr over 60 Minutes Intravenous Every 8 hours 11/02/19 1602 11/03/19 0223   11/02/19 1515  cefTRIAXone (ROCEPHIN) 1 g in sodium chloride 0.9 % 100 mL IVPB        1 g 200 mL/hr over 30 Minutes Intravenous  Once 11/02/19 1504 11/02/19 1643   11/02/19 1515  metroNIDAZOLE (FLAGYL) IVPB 500 mg        500 mg 100 mL/hr over 60 Minutes Intravenous  Once 11/02/19 1504 11/02/19 1753       Subjective: Patient seen and examined at bedside.  Complains of lower abdominal pain.  Has had fevers overnight.  Denies any current nausea.  Denies chest pain or worsening shortness of breath.  Objective: Vitals:   11/03/19 0415 11/03/19 0514 11/03/19 0614 11/03/19 0714  BP: (!) 99/59 (!) 96/58 (!) 89/52 100/65  Pulse: (!) 120 (!) 121  (!) 125  Resp: 20 20 (!) 7 18  Temp: (!) 102.2 F (39 C) (!) 102.1 F (38.9 C) (!) 101.1 F (38.4 C) 99.4 F (37.4 C)  TempSrc: Oral Oral Oral Oral  SpO2: 100% 100% 100% 100%  Weight:      Height:        Intake/Output Summary (Last 24 hours) at 11/03/2019 1287 Last data filed at 11/02/2019 1753 Gross per 24 hour  Intake 1200 ml  Output --  Net 1200 ml   Filed Weights   11/02/19 0558 11/03/19 0336  Weight: 90 kg (!) 92.5 kg    Examination:  General exam: Appears calm and comfortable. Respiratory system: Bilateral decreased breath sounds at bases with scattered crackles Cardiovascular system: S1 & S2 heard, tachycardic Gastrointestinal system: Abdomen is nondistended, soft and tender in the lower quadrant.  Bowel sounds sluggish. Extremities: No cyanosis, clubbing, edema  Central nervous system: Alert and oriented. No focal neurological deficits. Moving extremities Skin: No rashes, lesions or ulcers Psychiatry: Judgement and insight appear normal. Mood & affect appropriate.     Data Reviewed: I have personally reviewed  following labs and imaging studies  CBC: Recent Labs  Lab 11/02/19 0617 11/03/19 0352  WBC 18.6* 12.6*  NEUTROABS 14.3*  --   HGB 9.9* 8.4*  HCT 31.3* 24.6*  MCV 72.1* 69.5*  PLT 435* 328   Basic Metabolic Panel: Recent Labs  Lab 11/02/19 0617 11/03/19 0352  NA 132* 133*  K 3.1* 3.0*  CL 98 101  CO2 17* 23  GLUCOSE 106* 107*  BUN 7 9  CREATININE 0.80 0.63  CALCIUM 8.7* 7.9*   GFR: Estimated Creatinine Clearance: 91.1 mL/min (by C-G formula based on SCr of 0.63 mg/dL). Liver Function Tests: Recent Labs  Lab 11/02/19 0617  AST 17  ALT 13  ALKPHOS 72  BILITOT 1.7*  PROT 7.7  ALBUMIN 2.6*   No results for input(s): LIPASE, AMYLASE in the last 168 hours. No results for input(s): AMMONIA in the last 168 hours. Coagulation Profile: No results for input(s): INR, PROTIME in the last 168 hours. Cardiac Enzymes: No results for input(s): CKTOTAL, CKMB, CKMBINDEX, TROPONINI in the last 168 hours. BNP (last 3 results) No results for input(s): PROBNP in the last 8760 hours. HbA1C: No results for input(s): HGBA1C in the last 72 hours. CBG: No results for input(s): GLUCAP in the last 168 hours. Lipid Profile: No results for input(s): CHOL, HDL, LDLCALC, TRIG, CHOLHDL, LDLDIRECT in the last 72 hours. Thyroid Function Tests: No results for input(s): TSH, T4TOTAL, FREET4, T3FREE, THYROIDAB in the last 72 hours. Anemia Panel: No results for input(s): VITAMINB12, FOLATE, FERRITIN, TIBC, IRON, RETICCTPCT in the last 72 hours. Sepsis Labs: Recent Labs  Lab 11/02/19 1412 11/02/19 1523 11/03/19 0352  LATICACIDVEN 2.2* 1.5 1.0    Recent Results (from the past 240 hour(s))  SARS Coronavirus 2 by RT PCR (hospital order, performed in [facility] hospital lab) Nasopharyngeal Nasopharyngeal Swab     Status: None   Collection Time: 11/02/19  3:59 PM   Specimen: Nasopharyngeal Swab  Result Value Ref Range Status   SARS Coronavirus 2 NEGATIVE NEGATIVE Final    Comment:  (NOTE) SARS-CoV-2 target nucleic acids are NOT DETECTED.  The SARS-CoV-2 RNA is generally detectable in upper and lower respiratory specimens during the acute phase of infection. The lowest concentration of SARS-CoV-2 viral copies this assay can detect is 250 copies / mL. A negative result does not preclude SARS-CoV-2 infection and should not be used as the sole basis for treatment or other patient management decisions.  A negative result may occur with improper specimen collection / handling, submission of specimen other than nasopharyngeal swab, presence of viral mutation(s) within the areas targeted by this assay, and inadequate number of viral copies (<250 copies / mL). A negative result must be combined with clinical observations, patient history, and epidemiological information.  Fact Sheet for Patients:   BoilerBrush.com.cy  Fact Sheet for Healthcare Providers: https://pope.com/  This test  is not yet approved or  cleared by the [place] and has been authorized for detection and/or diagnosis of SARS-CoV-2 by FDA under an Emergency Use Authorization (EUA).  This EUA will remain in effect (meaning this test can be used) for the duration of the COVID-19 declaration under Section 564(b)(1) of the Act, 21 U.S.C. section 360bbb-3(b)(1), unless the authorization is terminated or revoked sooner.  Performed at [facility], [address], [place], [place] [address]          Radiology Studies: CT ABDOMEN PELVIS W CONTRAST  Result Date: 11/02/2019 CLINICAL DATA:  Abdominal pain. EXAM: CT ABDOMEN AND PELVIS WITH CONTRAST TECHNIQUE: Multidetector CT imaging of the abdomen and pelvis was performed using the standard protocol following bolus administration of intravenous contrast. CONTRAST:  OMNIPAQUE IOHEXOL 300 MG/ML  SOLN COMPARISON:  CT abdomen dated 10/05/2012 FINDINGS: Lower chest: No acute abnormality.  Hepatobiliary: No focal liver abnormality is seen. No gallstones, gallbladder wall thickening, or biliary dilatation. Pancreas: Unremarkable. No pancreatic ductal dilatation or surrounding inflammatory changes. Spleen: Normal in size without focal abnormality. Adrenals/Urinary Tract: Adrenal glands appear normal. Kidneys appear normal without mass, stone or hydronephrosis. No obstructing ureteral or bladder calculi are identified. Stomach/Bowel: Marked thickening of the walls of the sigmoid colon indicating acute diverticulitis. Multiple abscess collections extending inferior and anterior to the affected segment of the sigmoid colon. These abscess collections are potentially multiloculated but the major components appear to be contiguous from just above the level of the bladder to the anterior abdominal wall, overall measuring approximately 12 cm AP extent (series 3, image 70). The component of the abscess collection within the anterior abdominal wall measures 7.2 x 3.2 x 5.8 cm (craniocaudal by AP by transverse dimensions) (series 3, image 71; coronal series 6, image 18). The most inferior component of the abscess collection abuts the dome of the bladder, and there is thickening of the underlying bladder wall, suggesting a developing colovesical fistula (axial series 3, images 70 through 75). Extensive diverticulosis throughout the colon, but no other site of acute/subacute diverticulitis. No bowel obstruction. Vascular/Lymphatic: No significant vascular findings are present. No enlarged abdominal or pelvic lymph nodes. Reproductive: Presumed hysterectomy.  No adnexal mass or free fluid. Other: No free intraperitoneal air identified. Musculoskeletal: No acute or suspicious osseous finding. IMPRESSION: 1. Acute or subacute diverticulitis of the sigmoid colon. 2. Multiple associated abscess collections extending inferior and anterior to the affected segment of the sigmoid colon. These abscess collections are  potentially multiloculated but the major components appear to be contiguous from just above the level of the bladder to the anterior abdominal wall, overall measuring approximately 12 cm AP extent. The component of the abscess collection within the anterior abdominal wall measures 7.2 x 3.2 x 5.8 cm. The most inferior component of the abscess collection abuts the dome of the bladder, and there is thickening of the underlying bladder wall, suggesting a developing colovesical fistula. Given the distortion of the sigmoid colon loops/walls, I suspect colocolic fistulas as well. 3. Extensive diverticulosis. 4. No free intraperitoneal air identified. Electronically Signed   By: Bary Richard M.D.   On: 11/02/2019 14:21        Scheduled Meds: . enoxaparin (LOVENOX) injection  40 mg Subcutaneous Q24H   Continuous Infusions: . 0.9 % NaCl with KCl 40 mEq / L 100 mL/hr (11/03/19 0849)  . piperacillin-tazobactam (ZOSYN)  IV 3.375 g (11/03/19 0330)  . potassium chloride 10 mEq (11/03/19 0902)  Glade Lloyd, MD [facility] 11/03/2019, 9:23 AM

## 2019-11-03 NOTE — Progress Notes (Addendum)
Subjective: CC: Abdominal pain Febrile overnight to 102.5. Patient notes continued pain in her lower abdomen.  Reports she has been hurting for several weeks.  No nausea or vomiting.  She is passing flatus.  Last BM yesterday.  She notes dysuria.  No pneumaturia or flecks of stool in her urine.  Objective: Vital signs in last 24 hours: Temp:  [98.7 F (37.1 C)-102.5 F (39.2 C)] 98.7 F (37.1 C) (08/02 1149) Pulse Rate:  [108-126] 118 (08/02 1149) Resp:  [7-20] 18 (08/02 1149) BP: (89-126)/(48-71) 100/49 (08/02 1149) SpO2:  [99 %-100 %] 100 % (08/02 1149) Weight:  [92.5 kg] 92.5 kg (08/02 0336) Last BM Date: 11/02/19  Intake/Output from previous day: 08/01 0701 - 08/02 0700 In: 1200 [IV Piggyback:1200] Out: -  Intake/Output this shift: No intake/output data recorded.  PE: Gen: Awake and alert, NAD Heart: Tachycardic on monitor Lungs: Normal rate and effort Abd: Soft, mild distension, tenderness over supapubic abdomen where there is a palpable firm mass. No skin erythema or drainage.    Lab Results:  Recent Labs    11/02/19 0617 11/03/19 0352  WBC 18.6* 12.6*  HGB 9.9* 8.4*  HCT 31.3* 24.6*  PLT 435* 328   BMET Recent Labs    11/02/19 0617 11/03/19 0352  NA 132* 133*  K 3.1* 3.0*  CL 98 101  CO2 17* 23  GLUCOSE 106* 107*  BUN 7 9  CREATININE 0.80 0.63  CALCIUM 8.7* 7.9*   PT/INR No results for input(s): LABPROT, INR in the last 72 hours. CMP     Component Value Date/Time   NA 133 (L) 11/03/2019 0352   K 3.0 (L) 11/03/2019 0352   CL 101 11/03/2019 0352   CO2 23 11/03/2019 0352   GLUCOSE 107 (H) 11/03/2019 0352   BUN 9 11/03/2019 0352   CREATININE 0.63 11/03/2019 0352   CALCIUM 7.9 (L) 11/03/2019 0352   PROT 7.7 11/02/2019 0617   ALBUMIN 2.6 (L) 11/02/2019 0617   AST 17 11/02/2019 0617   ALT 13 11/02/2019 0617   ALKPHOS 72 11/02/2019 0617   BILITOT 1.7 (H) 11/02/2019 0617   GFRNONAA >60 11/03/2019 0352   GFRAA >60 11/03/2019 0352    Lipase  No results found for: LIPASE     Studies/Results: CT ABDOMEN PELVIS W CONTRAST  Result Date: 11/02/2019 CLINICAL DATA:  Abdominal pain. EXAM: CT ABDOMEN AND PELVIS WITH CONTRAST TECHNIQUE: Multidetector CT imaging of the abdomen and pelvis was performed using the standard protocol following bolus administration of intravenous contrast. CONTRAST:  OMNIPAQUE IOHEXOL 300 MG/ML  SOLN COMPARISON:  CT abdomen dated 10/05/2012 FINDINGS: Lower chest: No acute abnormality. Hepatobiliary: No focal liver abnormality is seen. No gallstones, gallbladder wall thickening, or biliary dilatation. Pancreas: Unremarkable. No pancreatic ductal dilatation or surrounding inflammatory changes. Spleen: Normal in size without focal abnormality. Adrenals/Urinary Tract: Adrenal glands appear normal. Kidneys appear normal without mass, stone or hydronephrosis. No obstructing ureteral or bladder calculi are identified. Stomach/Bowel: Marked thickening of the walls of the sigmoid colon indicating acute diverticulitis. Multiple abscess collections extending inferior and anterior to the affected segment of the sigmoid colon. These abscess collections are potentially multiloculated but the major components appear to be contiguous from just above the level of the bladder to the anterior abdominal wall, overall measuring approximately 12 cm AP extent (series 3, image 70). The component of the abscess collection within the anterior abdominal wall measures 7.2 x 3.2 x 5.8 cm (craniocaudal by AP by transverse  dimensions) (series 3, image 71; coronal series 6, image 18). The most inferior component of the abscess collection abuts the dome of the bladder, and there is thickening of the underlying bladder wall, suggesting a developing colovesical fistula (axial series 3, images 70 through 75). Extensive diverticulosis throughout the colon, but no other site of acute/subacute diverticulitis. No bowel obstruction.  Vascular/Lymphatic: No significant vascular findings are present. No enlarged abdominal or pelvic lymph nodes. Reproductive: Presumed hysterectomy.  No adnexal mass or free fluid. Other: No free intraperitoneal air identified. Musculoskeletal: No acute or suspicious osseous finding. IMPRESSION: 1. Acute or subacute diverticulitis of the sigmoid colon. 2. Multiple associated abscess collections extending inferior and anterior to the affected segment of the sigmoid colon. These abscess collections are potentially multiloculated but the major components appear to be contiguous from just above the level of the bladder to the anterior abdominal wall, overall measuring approximately 12 cm AP extent. The component of the abscess collection within the anterior abdominal wall measures 7.2 x 3.2 x 5.8 cm. The most inferior component of the abscess collection abuts the dome of the bladder, and there is thickening of the underlying bladder wall, suggesting a developing colovesical fistula. Given the distortion of the sigmoid colon loops/walls, I suspect colocolic fistulas as well. 3. Extensive diverticulosis. 4. No free intraperitoneal air identified. Electronically Signed   By: Bary Richard M.D.   On: 11/02/2019 14:21    Anti-infectives: Anti-infectives (From admission, onward)   Start     Dose/Rate Route Frequency Ordered Stop   11/03/19 0400  piperacillin-tazobactam (ZOSYN) IVPB 3.375 g     Discontinue     3.375 g 12.5 mL/hr over 240 Minutes Intravenous Every 8 hours 11/03/19 0240     11/02/19 2000  metroNIDAZOLE (FLAGYL) IVPB 500 mg  Status:  Discontinued        500 mg 100 mL/hr over 60 Minutes Intravenous Every 8 hours 11/02/19 1602 11/03/19 0223   11/02/19 1515  cefTRIAXone (ROCEPHIN) 1 g in sodium chloride 0.9 % 100 mL IVPB        1 g 200 mL/hr over 30 Minutes Intravenous  Once 11/02/19 1504 11/02/19 1643   11/02/19 1515  metroNIDAZOLE (FLAGYL) IVPB 500 mg        500 mg 100 mL/hr over 60 Minutes  Intravenous  Once 11/02/19 1504 11/02/19 1753       Assessment/Plan Hx of HTN Hypokalemia  - Per TRH -   Diverticulitis with multiple abscess Possible devolping colovesical fistula - CT with multiple associated abscess collections extending inferior and anterior to the affected segment of the sigmoid colon. Tthe major components appear to be contiguous from just above the level of the bladder to the anterior abdominal wall - Continue bowel rest, IV abx - IR consult for perc drainage - Hopefully will be able to avoid surgical intervention during admission, as this would result in colostomy creation. Will need colonoscopy after resolution of this acute episode and outpatient follow up with colorectal surgery for sigmoid colectomy.   FEN - NPO, IVF w/ K VTE - SCDs, okay for chemical prophylaxis from a general surgery standpoint after cleared by IR ID - Rocephin/Flagyl x 1. Zosyn 8/1 >> WBC 12.6. febrile to 102.5 overnight   LOS: 1 day    [name] , [facility] 11/03/2019, 11:59 AM Please see Amion for pager number during day hours 7:00am-4:30pm

## 2019-11-03 NOTE — Progress Notes (Addendum)
   11/03/19 0153  Assess: MEWS Score  Temp (!) 102.3 F (39.1 C)  BP (!) 109/59  Pulse Rate (!) 118  Resp 18  SpO2 100 %  O2 Device Room Air  Assess: MEWS Score  MEWS Temp 2  MEWS Systolic 0  MEWS Pulse 2  MEWS RR 0  MEWS LOC 0  MEWS Score 4  MEWS Score Color Red  Assess: if the MEWS score is Yellow or Red  Were vital signs taken at a resting state? Yes  Focused Assessment Change from prior assessment (see assessment flowsheet)  Early Detection of Sepsis Score *See Row Information* High  MEWS guidelines implemented *See Row Information* Yes  Treat  MEWS Interventions Administered prn meds/treatments;Escalated (See documentation below)  Pain Scale 0-10  Pain Score 0  Take Vital Signs  Increase Vital Sign Frequency  Red: Q 1hr X 4 then Q 4hr X 4, if remains red, continue Q 4hrs  Escalate  MEWS: Escalate Red: discuss with charge nurse/RN and provider, consider discussing with RRT  Notify: Charge Nurse/RN  Name of Charge Nurse/RN Notified Nellia Ragaas,RN  Date Charge Nurse/RN Notified 11/03/19  Time Charge Nurse/RN Notified 0205  Notify: Provider  Provider Name/Title V. Rathore,MD  Date Provider Notified 11/03/19  Time Provider Notified 0157  Notification Type Page  Notification Reason Other (Comment) (Pt on RED MEWS d/t temp 102.3 and elevated pulse)  Response See new orders  Date of Provider Response 11/03/19  Time of Provider Response 0203  Notify: Rapid Response  Name of Rapid Response RN Notified Mindy Hopper,RN  Date Rapid Response Notified 11/03/19  Time Rapid Response Notified 0219  Document  Progress note created (see row info) Yes

## 2019-11-03 NOTE — Progress Notes (Signed)
Received pt from ED. Pt alert and oriented x4.Pt instructed regarding visitation policy.Oriented to room and call bell.

## 2019-11-03 NOTE — Progress Notes (Addendum)
Floor coverage  Patient admitted for sepsis secondary to perforated recurrent diverticulitis with intra-abdominal abscesses.  Received 2 L IV fluid boluses, ceftriaxone, and Flagyl.  General surgery and IR on board.  Overnight: RED MEWS.  Patient continues to be tachycardic with heart rate in the 110s.  Not hypotensive.  Temperature 102.3 F.  No complaints of abdominal pain.   -Escalate antibiotic therapy --> switch to Zosyn -I have ordered blood cultures as they were not ordered at the time of admission -Tylenol as needed for fevers -Additional fluid bolus ordered -Repeat lactate -Transfer from MedSurg to progressive care unit for very close monitoring

## 2019-11-03 NOTE — Progress Notes (Signed)
Initial Nutrition Assessment  RD working remotely.  DOCUMENTATION CODES:   Obesity unspecified  INTERVENTION:  Once diet is advanced provide [employer] Standard 1.4 Cal vanilla po BID between meals, each supplement provides 455 kcal and 20 grams of protein.  NUTRITION DIAGNOSIS:   Inadequate oral intake related to decreased appetite as evidenced by per patient/family report.  GOAL:   Patient will meet greater than or equal to 90% of their needs  MONITOR:   Diet advancement, PO intake, Supplement acceptance, Labs, Weight trends, I & O's  REASON FOR ASSESSMENT:   Malnutrition Screening Tool    ASSESSMENT:   54 year old female with PMHx of HTN, recurrent diverticulitis admitted with acute diverticulitis with multiple abscesses.   -Plan is for possible drain placement by IR.  Spoke with patient over the phone. 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 Patient reports she still is able to eat 2-3 meals per day but is eating smaller portions on small plates at home. She typically has chicken, fish, and vegetables at meals. She reports her abdominal pain started several days ago. At home she also drinks a plant-based shake (Orgain) between meals. She does not like Ensure or Boost and prefers plan-based shakes. Discussed options on formulary and patient is amenable to trying Molli Posey with diet advancement.  Patient reports she feels she has lost 80-90 lbs over the last 6 months. However, she is unsure of her UBW as she does not weigh herself at home and asks to not be told the weights taken at MD appointments. There is no documented weight history in chart for RD to trend. Patient is currently 92.5 kg (204 lbs).  Medications reviewed and include: NS with KCl 40 mEq/L at 100 mL/hr, Zosyn, potassium chloride 10 mEq IV x 4 today.  Labs reviewed: Sodium 133, Potassium 3.  Patient is at risk  for malnutrition but RD unable to determine if patient meets criteria for malnutrition at this time.  NUTRITION - FOCUSED PHYSICAL EXAM:  Unable to complete as RD is working remotely.  Diet Order:   Diet Order            Diet NPO time specified Except for: Sips with Meds  Diet effective midnight           Diet NPO time specified  Diet effective midnight                EDUCATION NEEDS:   No education needs have been identified at this time  Skin:  Skin Assessment: Reviewed RN Assessment  Last BM:  11/02/2019  Height:   Ht Readings from Last 1 Encounters:  11/02/19 5\' 6"  (1.676 m)   Weight:   Wt Readings from Last 1 Encounters:  11/03/19 (!) 92.5 kg   BMI:  Body mass index is 32.93 kg/m.  Estimated Nutritional Needs:   Kcal:  2100-2300  Protein:  110-120 grams  Fluid:  2.1-2.3 L/day  01/03/20, MS, RD, LDN Pager number available on Amion

## 2019-11-03 NOTE — Progress Notes (Signed)
   11/02/19 2353  Assess: MEWS Score  Temp 99.1 F (37.3 C)  BP 105/68  Pulse Rate (!) 113  Resp 18  SpO2 100 %  O2 Device Room Air  Assess: MEWS Score  MEWS Temp 0  MEWS Systolic 0  MEWS Pulse 2  MEWS RR 0  MEWS LOC 0  MEWS Score 2  MEWS Score Color Yellow  Assess: if the MEWS score is Yellow or Red  Were vital signs taken at a resting state? Yes  Focused Assessment Change from prior assessment (see assessment flowsheet)  Early Detection of Sepsis Score *See Row Information* Low  MEWS guidelines implemented *See Row Information* Yes  Treat  MEWS Interventions Escalated (See documentation below)  Pain Scale 0-10  Pain Score 0  Take Vital Signs  Increase Vital Sign Frequency  Yellow: Q 2hr X 2 then Q 4hr X 2, if remains yellow, continue Q 4hrs  Escalate  MEWS: Escalate Yellow: discuss with charge nurse/RN and consider discussing with provider and RRT  Notify: Charge Nurse/RN  Name of Charge Nurse/RN Notified  Trudee Grip Ragaas,RN)  Date Charge Nurse/RN Notified 11/02/19  Time Charge Nurse/RN Notified 2359  Notify: Provider  Provider Name/Title V. Rathore,MD  Date Provider Notified 11/02/19  Time Provider Notified 2356  Notification Type Page  Notification Reason Other (Comment) (Pt on yellow mews d/t elevated pulse)  Response No new orders  Date of Provider Response 11/03/19  Time of Provider Response 0004  Document  Progress note created (see row info) Yes

## 2019-11-03 NOTE — Progress Notes (Addendum)
Pt transferred to 6E24. Report called to 6East RN

## 2019-11-04 ENCOUNTER — Inpatient Hospital Stay (HOSPITAL_COMMUNITY): Payer: Commercial Managed Care - PPO

## 2019-11-04 LAB — CBC WITH DIFFERENTIAL/PLATELET
Abs Immature Granulocytes: 0.14 10*3/uL — ABNORMAL HIGH (ref 0.00–0.07)
Basophils Absolute: 0 10*3/uL (ref 0.0–0.1)
Basophils Relative: 0 %
Eosinophils Absolute: 0.1 10*3/uL (ref 0.0–0.5)
Eosinophils Relative: 1 %
HCT: 23.5 % — ABNORMAL LOW (ref 36.0–46.0)
Hemoglobin: 8.2 g/dL — ABNORMAL LOW (ref 12.0–15.0)
Immature Granulocytes: 1 %
Lymphocytes Relative: 8 %
Lymphs Abs: 1.1 10*3/uL (ref 0.7–4.0)
MCH: 24.2 pg — ABNORMAL LOW (ref 26.0–34.0)
MCHC: 34.9 g/dL (ref 30.0–36.0)
MCV: 69.3 fL — ABNORMAL LOW (ref 80.0–100.0)
Monocytes Absolute: 1.2 10*3/uL — ABNORMAL HIGH (ref 0.1–1.0)
Monocytes Relative: 9 %
Neutro Abs: 11.1 10*3/uL — ABNORMAL HIGH (ref 1.7–7.7)
Neutrophils Relative %: 81 %
Platelets: 342 10*3/uL (ref 150–400)
RBC: 3.39 MIL/uL — ABNORMAL LOW (ref 3.87–5.11)
RDW: 16.8 % — ABNORMAL HIGH (ref 11.5–15.5)
WBC: 13.7 10*3/uL — ABNORMAL HIGH (ref 4.0–10.5)
nRBC: 0 % (ref 0.0–0.2)

## 2019-11-04 LAB — COMPREHENSIVE METABOLIC PANEL
ALT: 13 U/L (ref 0–44)
AST: 14 U/L — ABNORMAL LOW (ref 15–41)
Albumin: 1.7 g/dL — ABNORMAL LOW (ref 3.5–5.0)
Alkaline Phosphatase: 59 U/L (ref 38–126)
Anion gap: 6 (ref 5–15)
BUN: 10 mg/dL (ref 6–20)
CO2: 24 mmol/L (ref 22–32)
Calcium: 8.3 mg/dL — ABNORMAL LOW (ref 8.9–10.3)
Chloride: 108 mmol/L (ref 98–111)
Creatinine, Ser: 0.64 mg/dL (ref 0.44–1.00)
GFR calc Af Amer: >60 mL/min (ref >60)
GFR calc non Af Amer: >60 mL/min (ref >60)
Glucose, Bld: 100 mg/dL — ABNORMAL HIGH (ref 70–99)
Potassium: 4.4 mmol/L (ref 3.5–5.1)
Sodium: 138 mmol/L (ref 135–145)
Total Bilirubin: 0.9 mg/dL (ref 0.3–1.2)
Total Protein: 5.5 g/dL — ABNORMAL LOW (ref 6.5–8.1)

## 2019-11-04 LAB — PROTIME-INR
INR: 1.4 — ABNORMAL HIGH (ref 0.8–1.2)
Prothrombin Time: 16.4 seconds — ABNORMAL HIGH (ref 11.4–15.2)

## 2019-11-04 LAB — MAGNESIUM: Magnesium: 1.8 mg/dL (ref 1.7–2.4)

## 2019-11-04 MED ORDER — FENTANYL CITRATE (PF) 100 MCG/2ML IJ SOLN
INTRAMUSCULAR | Status: AC
  Filled 2019-11-04: qty 2

## 2019-11-04 MED ORDER — ONDANSETRON HCL 4 MG/2ML IJ SOLN
INTRAMUSCULAR | Status: AC
  Administered 2019-11-04: 4 mg
  Filled 2019-11-04: qty 2

## 2019-11-04 MED ORDER — FENTANYL CITRATE (PF) 100 MCG/2ML IJ SOLN
INTRAMUSCULAR | Status: AC | PRN
  Administered 2019-11-04: 50 ug via INTRAVENOUS

## 2019-11-04 MED ORDER — MIDAZOLAM HCL 2 MG/2ML IJ SOLN
INTRAMUSCULAR | Status: AC
  Filled 2019-11-04: qty 2

## 2019-11-04 MED ORDER — SODIUM CHLORIDE 0.9 % IV SOLN: INTRAVENOUS | Status: DC

## 2019-11-04 MED ORDER — MIDAZOLAM HCL 2 MG/2ML IJ SOLN
INTRAMUSCULAR | Status: AC | PRN
  Administered 2019-11-04: 1 mg via INTRAVENOUS

## 2019-11-04 MED ORDER — SODIUM CHLORIDE 0.9% FLUSH
5.0000 mL | Freq: Three times a day (TID) | INTRAVENOUS | Status: DC
  Administered 2019-11-04 – 2019-11-09 (×12): 5 mL

## 2019-11-04 MED ORDER — LIDOCAINE HCL 1 % IJ SOLN
INTRAMUSCULAR | Status: AC
  Filled 2019-11-04: qty 20

## 2019-11-04 NOTE — Progress Notes (Signed)
Patient ID: [name], female   DOB: 07/04/1965, 54 y.o.   MRN: [id number]  PROGRESS NOTE    [name]  LKT:[id number] DOB: 17-May-1965 DOA: 11/02/2019 PCP: Soundra Pilon, FNP   Brief Narrative:  .-year-old female with history of hypertension, recurrent diverticulitis presented with abdominal pain.  On presentation, WBCs 18.6.  CT abdomen with contrast showed acute diverticulitis with multiple abscesses.  She was started on antibiotics.  General surgery was consulted.  Assessment & Plan:   Acute recurrent diverticulitis with multiple intra-abdominal abscesses Developing colovesical fistula -CT abdomen on presentation showed acute diverticulitis with multiple abscesses with concern for developing colovesical fistula -General surgery has been consulted and recommend IR for possible drain placement.   -Status post percutaneous drain placement by IR today on 11/04/2019. -Continue Zosyn.  Follow cultures.  Continue IV fluids and n.p.o. except for ice chips and meds for now.  Pain management.  Leukocytosis -Monitor.  Hyponatremia -HCTZ on hold.  Continue IV fluids.  Monitor  Hypokalemia -Improved.  Repeat a.m. labs  Hypertension -HCTZ and ACE inhibitor held.  Continue as needed hydralazine.  DVT prophylaxis: Lovenox on hold for now, resume once cleared by IR.  SCDs Code Status: Full Family Communication: Patient at bedside along with mother and sister Disposition Plan: Status is: Inpatient  Remains inpatient appropriate because:Inpatient level of care appropriate due to severity of illness   Dispo: The patient is from: Home              Anticipated d/c is to: Home              Anticipated d/c date is: > 3 days              Patient currently is not medically stable to d/c.   Consultants: General surgery/IR  Procedures: None  Antimicrobials:  Anti-infectives (From admission, onward)   Start     Dose/Rate Route Frequency Ordered Stop   11/03/19 0400   piperacillin-tazobactam (ZOSYN) IVPB 3.375 g     Discontinue     3.375 g 12.5 mL/hr over 240 Minutes Intravenous Every 8 hours 11/03/19 0240     11/02/19 2000  metroNIDAZOLE (FLAGYL) IVPB 500 mg  Status:  Discontinued        500 mg 100 mL/hr over 60 Minutes Intravenous Every 8 hours 11/02/19 1602 11/03/19 0223   11/02/19 1515  cefTRIAXone (ROCEPHIN) 1 g in sodium chloride 0.9 % 100 mL IVPB        1 g 200 mL/hr over 30 Minutes Intravenous  Once 11/02/19 1504 11/02/19 1643   11/02/19 1515  metroNIDAZOLE (FLAGYL) IVPB 500 mg        500 mg 100 mL/hr over 60 Minutes Intravenous  Once 11/02/19 1504 11/02/19 1753       Subjective: Patient seen and examined at bedside.  Denies overnight fever, vomiting.  Her abdominal pain is improving.  No chest pain or shortness of breath reported. Objective: Vitals:   11/03/19 1953 11/03/19 2349 11/04/19 0244 11/04/19 0247  BP: 93/64 (!) 85/63  102/71  Pulse: 100 99  100  Resp: 17 19  16   Temp: 99.3 F (37.4 C) 98.3 F (36.8 C)  98 F (36.7 C)  TempSrc: Oral Oral  Oral  SpO2: 98% 99%  100%  Weight:   93.4 kg   Height:       No intake or output data in the 24 hours ending 11/04/19 0753 Filed Weights   11/02/19 0558 11/03/19 0336 11/04/19 0244  Weight:  90 kg (!) 92.5 kg 93.4 kg    Examination:  General exam: No acute distress. Respiratory system: Bilateral decreased breath sounds at bases with some crackles, no wheezing Cardiovascular system: Currently rate controlled, S1-S2 heard Gastrointestinal system: Abdomen is nondistended, soft and has lower quadrant tenderness.  Lower quadrant dressing with drains present.  Sluggish bowel sounds  extremities: No edema or clubbing Central nervous system: Awake and oriented.  No focal neurological deficits.  Moves extremities  skin: No obvious ulcers or ecchymosis Psychiatry: Normal mood, affect and judgment.   Data Reviewed: I have personally reviewed following labs and imaging  studies  CBC: Recent Labs  Lab 11/02/19 0617 11/03/19 0352 11/04/19 0428  WBC 18.6* 12.6* 13.7*  NEUTROABS 14.3*  --  11.1*  HGB 9.9* 8.4* 8.2*  HCT 31.3* 24.6* 23.5*  MCV 72.1* 69.5* 69.3*  PLT 435* 328 342   Basic Metabolic Panel: Recent Labs  Lab 11/02/19 0617 11/03/19 0352 11/04/19 0428  NA 132* 133* 138  K 3.1* 3.0* 4.4  CL 98 101 108  CO2 17* 23 24  GLUCOSE 106* 107* 100*  BUN 7 9 10   CREATININE 0.80 0.63 0.64  CALCIUM 8.7* 7.9* 8.3*  MG  --   --  1.8   GFR: Estimated Creatinine Clearance: 91.4 mL/min (by C-G formula based on SCr of 0.64 mg/dL). Liver Function Tests: Recent Labs  Lab 11/02/19 0617 11/04/19 0428  AST 17 14*  ALT 13 13  ALKPHOS 72 59  BILITOT 1.7* 0.9  PROT 7.7 5.5*  ALBUMIN 2.6* 1.7*   No results for input(s): LIPASE, AMYLASE in the last 168 hours. No results for input(s): AMMONIA in the last 168 hours. Coagulation Profile: Recent Labs  Lab 11/04/19 0428  INR 1.4*   Cardiac Enzymes: No results for input(s): CKTOTAL, CKMB, CKMBINDEX, TROPONINI in the last 168 hours. BNP (last 3 results) No results for input(s): PROBNP in the last 8760 hours. HbA1C: No results for input(s): HGBA1C in the last 72 hours. CBG: No results for input(s): GLUCAP in the last 168 hours. Lipid Profile: No results for input(s): CHOL, HDL, LDLCALC, TRIG, CHOLHDL, LDLDIRECT in the last 72 hours. Thyroid Function Tests: No results for input(s): TSH, T4TOTAL, FREET4, T3FREE, THYROIDAB in the last 72 hours. Anemia Panel: No results for input(s): VITAMINB12, FOLATE, FERRITIN, TIBC, IRON, RETICCTPCT in the last 72 hours. Sepsis Labs: Recent Labs  Lab 11/02/19 1412 11/02/19 1523 11/03/19 0352  LATICACIDVEN 2.2* 1.5 1.0    Recent Results (from the past 240 hour(s))  SARS Coronavirus 2 by RT PCR (hospital order, performed in [facility] hospital lab) Nasopharyngeal Nasopharyngeal Swab     Status: None   Collection Time: 11/02/19  3:59 PM   Specimen:  Nasopharyngeal Swab  Result Value Ref Range Status   SARS Coronavirus 2 NEGATIVE NEGATIVE Final    Comment: (NOTE) SARS-CoV-2 target nucleic acids are NOT DETECTED.  The SARS-CoV-2 RNA is generally detectable in upper and lower respiratory specimens during the acute phase of infection. The lowest concentration of SARS-CoV-2 viral copies this assay can detect is 250 copies / mL. A negative result does not preclude SARS-CoV-2 infection and should not be used as the sole basis for treatment or other patient management decisions.  A negative result may occur with improper specimen collection / handling, submission of specimen other than nasopharyngeal swab, presence of viral mutation(s) within the areas targeted by this assay, and inadequate number of viral copies (<250 copies / mL). A negative result must be combined  with clinical observations, patient history, and epidemiological information.  Fact Sheet for Patients:   BoilerBrush.com.cy  Fact Sheet for Healthcare Providers: https://pope.com/  This test is not yet approved or  cleared by the [place] FDA and has been authorized for detection and/or diagnosis of SARS-CoV-2 by FDA under an Emergency Use Authorization (EUA).  This EUA will remain in effect (meaning this test can be used) for the duration of the COVID-19 declaration under Section 564(b)(1) of the Act, 21 U.S.C. section 360bbb-3(b)(1), unless the authorization is terminated or revoked sooner.  Performed at [facility], [address], [place], [place] [address]          Radiology Studies: CT ABDOMEN PELVIS W CONTRAST  Result Date: 11/02/2019 CLINICAL DATA:  Abdominal pain. EXAM: CT ABDOMEN AND PELVIS WITH CONTRAST TECHNIQUE: Multidetector CT imaging of the abdomen and pelvis was performed using the standard protocol following bolus administration of intravenous contrast. CONTRAST:  OMNIPAQUE IOHEXOL  300 MG/ML  SOLN COMPARISON:  CT abdomen dated 10/05/2012 FINDINGS: Lower chest: No acute abnormality. Hepatobiliary: No focal liver abnormality is seen. No gallstones, gallbladder wall thickening, or biliary dilatation. Pancreas: Unremarkable. No pancreatic ductal dilatation or surrounding inflammatory changes. Spleen: Normal in size without focal abnormality. Adrenals/Urinary Tract: Adrenal glands appear normal. Kidneys appear normal without mass, stone or hydronephrosis. No obstructing ureteral or bladder calculi are identified. Stomach/Bowel: Marked thickening of the walls of the sigmoid colon indicating acute diverticulitis. Multiple abscess collections extending inferior and anterior to the affected segment of the sigmoid colon. These abscess collections are potentially multiloculated but the major components appear to be contiguous from just above the level of the bladder to the anterior abdominal wall, overall measuring approximately 12 cm AP extent (series 3, image 70). The component of the abscess collection within the anterior abdominal wall measures 7.2 x 3.2 x 5.8 cm (craniocaudal by AP by transverse dimensions) (series 3, image 71; coronal series 6, image 18). The most inferior component of the abscess collection abuts the dome of the bladder, and there is thickening of the underlying bladder wall, suggesting a developing colovesical fistula (axial series 3, images 70 through 75). Extensive diverticulosis throughout the colon, but no other site of acute/subacute diverticulitis. No bowel obstruction. Vascular/Lymphatic: No significant vascular findings are present. No enlarged abdominal or pelvic lymph nodes. Reproductive: Presumed hysterectomy.  No adnexal mass or free fluid. Other: No free intraperitoneal air identified. Musculoskeletal: No acute or suspicious osseous finding. IMPRESSION: 1. Acute or subacute diverticulitis of the sigmoid colon. 2. Multiple associated abscess collections extending  inferior and anterior to the affected segment of the sigmoid colon. These abscess collections are potentially multiloculated but the major components appear to be contiguous from just above the level of the bladder to the anterior abdominal wall, overall measuring approximately 12 cm AP extent. The component of the abscess collection within the anterior abdominal wall measures 7.2 x 3.2 x 5.8 cm. The most inferior component of the abscess collection abuts the dome of the bladder, and there is thickening of the underlying bladder wall, suggesting a developing colovesical fistula. Given the distortion of the sigmoid colon loops/walls, I suspect colocolic fistulas as well. 3. Extensive diverticulosis. 4. No free intraperitoneal air identified. Electronically Signed   By: Bary Richard M.D.   On: 11/02/2019 14:21        Scheduled Meds: . lidocaine       Continuous Infusions: . 0.9 % NaCl with KCl 40 mEq / L 100 mL/hr at  11/04/19 0003  . piperacillin-tazobactam (ZOSYN)  IV 3.375 g (11/04/19 0544)          Glade Lloyd, MD [facility] 11/04/2019, 7:53 AM

## 2019-11-04 NOTE — Procedures (Signed)
Interventional Radiology Procedure Note  Procedure: Image guided drain placement, x 2 Superficial component, pelvis.  2F pigtail drain. Deep component, intra-pelvic, 2F pigtail. Both on suction bulb Complications: None  EBL: None Sample: Culture sent  Recommendations: - Routine drain care, with sterile flushes, record output, both drains - follow up Cx - routine wound care  Signed,  Yvone Neu. Loreta Ave, DO

## 2019-11-04 NOTE — Progress Notes (Signed)
Subjective: CC: Abdominal pain Reports less pain and pressure s/p perc drainage this AM. Reports flatus and continued soft green BMs. Not mobilizing because she states staff wont let her.   HR 99-120, TMAX 99.3 Objective: Vital signs in last 24 hours: Temp:  [98 F (36.7 C)-99.3 F (37.4 C)] 98 F (36.7 C) (08/03 0247) Pulse Rate:  [99-126] 120 (08/03 0940) Resp:  [16-22] 20 (08/03 0940) BP: (85-113)/(49-73) 105/68 (08/03 0940) SpO2:  [98 %-100 %] 99 % (08/03 0940) Weight:  [93.4 kg] 93.4 kg (08/03 0244) Last BM Date: 11/03/19  Intake/Output from previous day: No intake/output data recorded. Intake/Output this shift: No intake/output data recorded.  PE: Gen: Awake and alert, NAD Heart: RRR on monitor Lungs: Normal rate and effort  Abd: Soft, mild distension, appropriately tender  JP x 2 supapubic region - both with purulent, light brown drainage, right bulb with air.    Lab Results:  Recent Labs    11/03/19 0352 11/04/19 0428  WBC 12.6* 13.7*  HGB 8.4* 8.2*  HCT 24.6* 23.5*  PLT 328 342   BMET Recent Labs    11/03/19 0352 11/04/19 0428  NA 133* 138  K 3.0* 4.4  CL 101 108  CO2 23 24  GLUCOSE 107* 100*  BUN 9 10  CREATININE 0.63 0.64  CALCIUM 7.9* 8.3*   PT/INR Recent Labs    11/04/19 0428  LABPROT 16.4*  INR 1.4*   CMP     Component Value Date/Time   NA 138 11/04/2019 0428   K 4.4 11/04/2019 0428   CL 108 11/04/2019 0428   CO2 24 11/04/2019 0428   GLUCOSE 100 (H) 11/04/2019 0428   BUN 10 11/04/2019 0428   CREATININE 0.64 11/04/2019 0428   CALCIUM 8.3 (L) 11/04/2019 0428   PROT 5.5 (L) 11/04/2019 0428   ALBUMIN 1.7 (L) 11/04/2019 0428   AST 14 (L) 11/04/2019 0428   ALT 13 11/04/2019 0428   ALKPHOS 59 11/04/2019 0428   BILITOT 0.9 11/04/2019 0428   GFRNONAA >60 11/04/2019 0428   GFRAA >60 11/04/2019 0428   Lipase  No results found for: LIPASE     Studies/Results: CT ABDOMEN PELVIS W CONTRAST  Result Date:  11/02/2019 CLINICAL DATA:  Abdominal pain. EXAM: CT ABDOMEN AND PELVIS WITH CONTRAST TECHNIQUE: Multidetector CT imaging of the abdomen and pelvis was performed using the standard protocol following bolus administration of intravenous contrast. CONTRAST:  OMNIPAQUE IOHEXOL 300 MG/ML  SOLN COMPARISON:  CT abdomen dated 10/05/2012 FINDINGS: Lower chest: No acute abnormality. Hepatobiliary: No focal liver abnormality is seen. No gallstones, gallbladder wall thickening, or biliary dilatation. Pancreas: Unremarkable. No pancreatic ductal dilatation or surrounding inflammatory changes. Spleen: Normal in size without focal abnormality. Adrenals/Urinary Tract: Adrenal glands appear normal. Kidneys appear normal without mass, stone or hydronephrosis. No obstructing ureteral or bladder calculi are identified. Stomach/Bowel: Marked thickening of the walls of the sigmoid colon indicating acute diverticulitis. Multiple abscess collections extending inferior and anterior to the affected segment of the sigmoid colon. These abscess collections are potentially multiloculated but the major components appear to be contiguous from just above the level of the bladder to the anterior abdominal wall, overall measuring approximately 12 cm AP extent (series 3, image 70). The component of the abscess collection within the anterior abdominal wall measures 7.2 x 3.2 x 5.8 cm (craniocaudal by AP by transverse dimensions) (series 3, image 71; coronal series 6, image 18). The most inferior component of the abscess collection abuts  the dome of the bladder, and there is thickening of the underlying bladder wall, suggesting a developing colovesical fistula (axial series 3, images 70 through 75). Extensive diverticulosis throughout the colon, but no other site of acute/subacute diverticulitis. No bowel obstruction. Vascular/Lymphatic: No significant vascular findings are present. No enlarged abdominal or pelvic lymph nodes. Reproductive:  Presumed hysterectomy.  No adnexal mass or free fluid. Other: No free intraperitoneal air identified. Musculoskeletal: No acute or suspicious osseous finding. IMPRESSION: 1. Acute or subacute diverticulitis of the sigmoid colon. 2. Multiple associated abscess collections extending inferior and anterior to the affected segment of the sigmoid colon. These abscess collections are potentially multiloculated but the major components appear to be contiguous from just above the level of the bladder to the anterior abdominal wall, overall measuring approximately 12 cm AP extent. The component of the abscess collection within the anterior abdominal wall measures 7.2 x 3.2 x 5.8 cm. The most inferior component of the abscess collection abuts the dome of the bladder, and there is thickening of the underlying bladder wall, suggesting a developing colovesical fistula. Given the distortion of the sigmoid colon loops/walls, I suspect colocolic fistulas as well. 3. Extensive diverticulosis. 4. No free intraperitoneal air identified. Electronically Signed   By: Bary Richard M.D.   On: 11/02/2019 14:21   CT IMAGE GUIDED DRAINAGE BY PERCUTANEOUS CATHETER  Result Date: 11/04/2019 INDICATION: 54 year old female with a history of pelvic abscess, trans spatial EXAM: CT GUIDED DRAINAGE OF  ABSCESS MEDICATIONS: The patient is currently admitted to the hospital and receiving intravenous antibiotics. The antibiotics were administered within an appropriate time frame prior to the initiation of the procedure. ANESTHESIA/SEDATION: 1.0 mg IV Versed 50 mcg IV Fentanyl Moderate Sedation Time:  21 minutes The patient was continuously monitored during the procedure by the interventional radiology nurse under my direct supervision. COMPLICATIONS: None TECHNIQUE: Informed written consent was obtained from the patient after a thorough discussion of the procedural risks, benefits and alternatives. All questions were addressed. Maximal Sterile Barrier  Technique was utilized including caps, mask, sterile gowns, sterile gloves, sterile drape, hand hygiene and skin antiseptic. A timeout was performed prior to the initiation of the procedure. PROCEDURE: The operative field was prepped with Chlorhexidine in a sterile fashion, and a sterile drape was applied covering the operative field. A sterile gown and sterile gloves were used for the procedure. Local anesthesia was provided with 1% Lidocaine. Once the patient is prepped and draped in the usual sterile fashion, 1% lidocaine was used for local anesthesia. Two parallel 18 gauge trocar needles were advanced with CT guidance into the deep abscess and the superficial component of the abscess. Using modified Seldinger technique, a 12 French drain was placed into the deep abscess, and separately into the superficial abscess. Approximately 60 cc of purulent material aspirated from the deep abscess with a culture sent. Both catheters were sutured in position and attached to bulb suction drainage. Final CT was acquired. Patient tolerated the procedure well and remained hemodynamically stable throughout. No complications were encountered and no significant blood loss. FINDINGS: CT demonstrates a multi spatial abscess within the pelvis, similar to the comparison CT. After drainage placement, there is a 68 [place] drain from anterior midline approach to the deep component, and a tangential right of midline 12 French drain into the superficial component. Both are attached to bulb drainage. IMPRESSION: Status post CT-guided drainage with placement of 2 drainage catheters into a multi spatial abscess of the pelvis. Signed, Yvone Neu. Loreta Ave, DO, RPVI  [facility] Radiology Electronically Signed   By: Gilmer Mor D.O.   On: 11/04/2019 10:03   CT IMAGE GUIDED DRAINAGE BY PERCUTANEOUS CATHETER  Result Date: 11/04/2019 INDICATION: 54 year old female with a history of pelvic abscess,  trans spatial EXAM: CT GUIDED DRAINAGE OF  ABSCESS MEDICATIONS: The patient is currently admitted to the hospital and receiving intravenous antibiotics. The antibiotics were administered within an appropriate time frame prior to the initiation of the procedure. ANESTHESIA/SEDATION: 1.0 mg IV Versed 50 mcg IV Fentanyl Moderate Sedation Time:  21 minutes The patient was continuously monitored during the procedure by the interventional radiology nurse under my direct supervision. COMPLICATIONS: None TECHNIQUE: Informed written consent was obtained from the patient after a thorough discussion of the procedural risks, benefits and alternatives. All questions were addressed. Maximal Sterile Barrier Technique was utilized including caps, mask, sterile gowns, sterile gloves, sterile drape, hand hygiene and skin antiseptic. A timeout was performed prior to the initiation of the procedure. PROCEDURE: The operative field was prepped with Chlorhexidine in a sterile fashion, and a sterile drape was applied covering the operative field. A sterile gown and sterile gloves were used for the procedure. Local anesthesia was provided with 1% Lidocaine. Once the patient is prepped and draped in the usual sterile fashion, 1% lidocaine was used for local anesthesia. Two parallel 18 gauge trocar needles were advanced with CT guidance into the deep abscess and the superficial component of the abscess. Using modified Seldinger technique, a 12 French drain was placed into the deep abscess, and separately into the superficial abscess. Approximately 60 cc of purulent material aspirated from the deep abscess with a culture sent. Both catheters were sutured in position and attached to bulb suction drainage. Final CT was acquired. Patient tolerated the procedure well and remained hemodynamically stable throughout. No complications were encountered and no significant blood loss. FINDINGS: CT demonstrates a multi spatial abscess within the pelvis,  similar to the comparison CT. After drainage placement, there is a 34 [place] drain from anterior midline approach to the deep component, and a tangential right of midline 12 French drain into the superficial component. Both are attached to bulb drainage. IMPRESSION: Status post CT-guided drainage with placement of 2 drainage catheters into a multi spatial abscess of the pelvis. Signed, Yvone Neu. Reyne Dumas, RPVI [facility] Radiology Electronically Signed   By: Gilmer Mor D.O.   On: 11/04/2019 10:03    Anti-infectives: Anti-infectives (From admission, onward)   Start     Dose/Rate Route Frequency Ordered Stop   11/03/19 0400  piperacillin-tazobactam (ZOSYN) IVPB 3.375 g     Discontinue     3.375 g 12.5 mL/hr over 240 Minutes Intravenous Every 8 hours 11/03/19 0240     11/02/19 2000  metroNIDAZOLE (FLAGYL) IVPB 500 mg  Status:  Discontinued        500 mg 100 mL/hr over 60 Minutes Intravenous Every 8 hours 11/02/19 1602 11/03/19 0223   11/02/19 1515  cefTRIAXone (ROCEPHIN) 1 g in sodium chloride 0.9 % 100 mL IVPB        1 g 200 mL/hr over 30 Minutes Intravenous  Once 11/02/19 1504 11/02/19 1643   11/02/19 1515  metroNIDAZOLE (FLAGYL) IVPB 500 mg        500 mg 100 mL/hr over 60 Minutes Intravenous  Once 11/02/19 1504 11/02/19 1753       Assessment/Plan Hx of HTN Hypokalemia  - Per TRH -   Diverticulitis with multiple  abscess Possible devolping colovesical fistula - afebrile last 24h, sinus tachycardia intermittently, WBC 13 from 12 - CT with multiple associated abscess collections extending inferior and anterior to the affected segment of the sigmoid colon. Tthe major components appear to be contiguous from just above the level of the bladder to the anterior abdominal wall - s/p IR perc drain x 2 11/04/19 - Continue bowel rest, IV abx, allow ice chips  - Hopefully will be able to avoid surgical intervention during admission, as this would  result in colostomy creation. Will need colonoscopy after resolution of this acute episode and outpatient follow up with colorectal surgery for sigmoid colectomy.  - OOB mobilize!   FEN - NPO, ice chips, IVF w/ K VTE - SCDs, okay for chemical prophylaxis from a general surgery standpoint after cleared by IR ID - Rocephin/Flagyl x 1. Zosyn 8/1 >>    LOS: 2 days    Adam Phenix , [facility] 11/04/2019, 10:08 AM Please see Amion for pager number during day hours 7:00am-4:30pm

## 2019-11-05 LAB — CBC WITH DIFFERENTIAL/PLATELET
Abs Immature Granulocytes: 0.42 10*3/uL — ABNORMAL HIGH (ref 0.00–0.07)
Basophils Absolute: 0.1 10*3/uL (ref 0.0–0.1)
Basophils Relative: 0 %
Eosinophils Absolute: 0.2 10*3/uL (ref 0.0–0.5)
Eosinophils Relative: 1 %
HCT: 23.7 % — ABNORMAL LOW (ref 36.0–46.0)
Hemoglobin: 8 g/dL — ABNORMAL LOW (ref 12.0–15.0)
Immature Granulocytes: 3 %
Lymphocytes Relative: 10 %
Lymphs Abs: 1.7 10*3/uL (ref 0.7–4.0)
MCH: 23.3 pg — ABNORMAL LOW (ref 26.0–34.0)
MCHC: 33.8 g/dL (ref 30.0–36.0)
MCV: 69.1 fL — ABNORMAL LOW (ref 80.0–100.0)
Monocytes Absolute: 1.3 10*3/uL — ABNORMAL HIGH (ref 0.1–1.0)
Monocytes Relative: 8 %
Neutro Abs: 13.2 10*3/uL — ABNORMAL HIGH (ref 1.7–7.7)
Neutrophils Relative %: 78 %
Platelets: 382 10*3/uL (ref 150–400)
RBC: 3.43 MIL/uL — ABNORMAL LOW (ref 3.87–5.11)
RDW: 17 % — ABNORMAL HIGH (ref 11.5–15.5)
WBC: 16.8 10*3/uL — ABNORMAL HIGH (ref 4.0–10.5)
nRBC: 0 % (ref 0.0–0.2)

## 2019-11-05 LAB — BASIC METABOLIC PANEL
Anion gap: 6 (ref 5–15)
BUN: 5 mg/dL — ABNORMAL LOW (ref 6–20)
CO2: 24 mmol/L (ref 22–32)
Calcium: 7.9 mg/dL — ABNORMAL LOW (ref 8.9–10.3)
Chloride: 104 mmol/L (ref 98–111)
Creatinine, Ser: 0.65 mg/dL (ref 0.44–1.00)
GFR calc Af Amer: >60 mL/min (ref >60)
GFR calc non Af Amer: >60 mL/min (ref >60)
Glucose, Bld: 106 mg/dL — ABNORMAL HIGH (ref 70–99)
Potassium: 3.4 mmol/L — ABNORMAL LOW (ref 3.5–5.1)
Sodium: 134 mmol/L — ABNORMAL LOW (ref 135–145)

## 2019-11-05 LAB — MAGNESIUM: Magnesium: 1.5 mg/dL — ABNORMAL LOW (ref 1.7–2.4)

## 2019-11-05 MED ORDER — POTASSIUM CHLORIDE CRYS ER 20 MEQ PO TBCR
40.0000 meq | EXTENDED_RELEASE_TABLET | Freq: Once | ORAL | Status: AC
  Administered 2019-11-05: 40 meq via ORAL
  Filled 2019-11-05: qty 2

## 2019-11-05 NOTE — Progress Notes (Addendum)
Subjective: CC: Feels better than yesterday. Still has 5/10 suprapubic abdominal pain that is worse with movement. No n/v. No flatus or BM since IR procedure. Her dysuria has resolved.   Objective: Vital signs in last 24 hours: Temp:  [98.4 F (36.9 C)-100.2 F (37.9 C)] 98.9 F (37.2 C) (08/04 0812) Pulse Rate:  [106-126] 106 (08/04 0812) Resp:  [17-22] 18 (08/04 0812) BP: (96-113)/(61-82) 104/72 (08/04 0812) SpO2:  [91 %-100 %] 100 % (08/04 0812) Weight:  [95.2 kg] 95.2 kg (08/04 0357) Last BM Date: 11/04/19  Intake/Output from previous day: 08/03 0701 - 08/04 0700 In: 1461.9 [P.O.:480; I.V.:690.9; IV Piggyback:281] Out: 100 [Drains:100] Intake/Output this shift: No intake/output data recorded.  PE: Gen: Awake and alert, NAD Heart: Tachycardic on monitor  Lungs: Normal rate and effort  Abd: Soft, mild distension, tenderness of the suprapubic abdomen without peritonitis.              JP x 2 supapubic region - both with purulent, light brown drainage Msk: No edema    Lab Results:  Recent Labs    11/04/19 0428 11/05/19 0433  WBC 13.7* 16.8*  HGB 8.2* 8.0*  HCT 23.5* 23.7*  PLT 342 382   BMET Recent Labs    11/03/19 0352 11/04/19 0428  NA 133* 138  K 3.0* 4.4  CL 101 108  CO2 23 24  GLUCOSE 107* 100*  BUN 9 10  CREATININE 0.63 0.64  CALCIUM 7.9* 8.3*   PT/INR Recent Labs    11/04/19 0428  LABPROT 16.4*  INR 1.4*   CMP     Component Value Date/Time   NA 138 11/04/2019 0428   K 4.4 11/04/2019 0428   CL 108 11/04/2019 0428   CO2 24 11/04/2019 0428   GLUCOSE 100 (H) 11/04/2019 0428   BUN 10 11/04/2019 0428   CREATININE 0.64 11/04/2019 0428   CALCIUM 8.3 (L) 11/04/2019 0428   PROT 5.5 (L) 11/04/2019 0428   ALBUMIN 1.7 (L) 11/04/2019 0428   AST 14 (L) 11/04/2019 0428   ALT 13 11/04/2019 0428   ALKPHOS 59 11/04/2019 0428   BILITOT 0.9 11/04/2019 0428   GFRNONAA >60 11/04/2019 0428   GFRAA >60 11/04/2019 0428   Lipase  No results  found for: LIPASE     Studies/Results: CT IMAGE GUIDED DRAINAGE BY PERCUTANEOUS CATHETER  Result Date: 11/04/2019 INDICATION: 54 year old female with a history of pelvic abscess, trans spatial EXAM: CT GUIDED DRAINAGE OF  ABSCESS MEDICATIONS: The patient is currently admitted to the hospital and receiving intravenous antibiotics. The antibiotics were administered within an appropriate time frame prior to the initiation of the procedure. ANESTHESIA/SEDATION: 1.0 mg IV Versed 50 mcg IV Fentanyl Moderate Sedation Time:  21 minutes The patient was continuously monitored during the procedure by the interventional radiology nurse under my direct supervision. COMPLICATIONS: None TECHNIQUE: Informed written consent was obtained from the patient after a thorough discussion of the procedural risks, benefits and alternatives. All questions were addressed. Maximal Sterile Barrier Technique was utilized including caps, mask, sterile gowns, sterile gloves, sterile drape, hand hygiene and skin antiseptic. A timeout was performed prior to the initiation of the procedure. PROCEDURE: The operative field was prepped with Chlorhexidine in a sterile fashion, and a sterile drape was applied covering the operative field. A sterile gown and sterile gloves were used for the procedure. Local anesthesia was provided with 1% Lidocaine. Once the patient is prepped and draped in the usual sterile fashion, 1% lidocaine was  used for local anesthesia. Two parallel 18 gauge trocar needles were advanced with CT guidance into the deep abscess and the superficial component of the abscess. Using modified Seldinger technique, a 12 French drain was placed into the deep abscess, and separately into the superficial abscess. Approximately 60 cc of purulent material aspirated from the deep abscess with a culture sent. Both catheters were sutured in position and attached to bulb suction drainage. Final CT was acquired. Patient tolerated the procedure  well and remained hemodynamically stable throughout. No complications were encountered and no significant blood loss. FINDINGS: CT demonstrates a multi spatial abscess within the pelvis, similar to the comparison CT. After drainage placement, there is a 6 [place] drain from anterior midline approach to the deep component, and a tangential right of midline 12 French drain into the superficial component. Both are attached to bulb drainage. IMPRESSION: Status post CT-guided drainage with placement of 2 drainage catheters into a multi spatial abscess of the pelvis. Signed, Yvone Neu. Reyne Dumas, RPVI [facility] Radiology Electronically Signed   By: Gilmer Mor D.O.   On: 11/04/2019 10:03   CT IMAGE GUIDED DRAINAGE BY PERCUTANEOUS CATHETER  Result Date: 11/04/2019 INDICATION: 54 year old female with a history of pelvic abscess, trans spatial EXAM: CT GUIDED DRAINAGE OF  ABSCESS MEDICATIONS: The patient is currently admitted to the hospital and receiving intravenous antibiotics. The antibiotics were administered within an appropriate time frame prior to the initiation of the procedure. ANESTHESIA/SEDATION: 1.0 mg IV Versed 50 mcg IV Fentanyl Moderate Sedation Time:  21 minutes The patient was continuously monitored during the procedure by the interventional radiology nurse under my direct supervision. COMPLICATIONS: None TECHNIQUE: Informed written consent was obtained from the patient after a thorough discussion of the procedural risks, benefits and alternatives. All questions were addressed. Maximal Sterile Barrier Technique was utilized including caps, mask, sterile gowns, sterile gloves, sterile drape, hand hygiene and skin antiseptic. A timeout was performed prior to the initiation of the procedure. PROCEDURE: The operative field was prepped with Chlorhexidine in a sterile fashion, and a sterile drape was applied covering the operative field. A sterile gown and  sterile gloves were used for the procedure. Local anesthesia was provided with 1% Lidocaine. Once the patient is prepped and draped in the usual sterile fashion, 1% lidocaine was used for local anesthesia. Two parallel 18 gauge trocar needles were advanced with CT guidance into the deep abscess and the superficial component of the abscess. Using modified Seldinger technique, a 12 French drain was placed into the deep abscess, and separately into the superficial abscess. Approximately 60 cc of purulent material aspirated from the deep abscess with a culture sent. Both catheters were sutured in position and attached to bulb suction drainage. Final CT was acquired. Patient tolerated the procedure well and remained hemodynamically stable throughout. No complications were encountered and no significant blood loss. FINDINGS: CT demonstrates a multi spatial abscess within the pelvis, similar to the comparison CT. After drainage placement, there is a 64 [place] drain from anterior midline approach to the deep component, and a tangential right of midline 12 French drain into the superficial component. Both are attached to bulb drainage. IMPRESSION: Status post CT-guided drainage with placement of 2 drainage catheters into a multi spatial abscess of the pelvis. Signed, Yvone Neu. Reyne Dumas, RPVI [facility] Radiology Electronically Signed   By: Gilmer Mor D.O.   On: 11/04/2019 10:03    Anti-infectives: Anti-infectives (From admission, onward)  Start     Dose/Rate Route Frequency Ordered Stop   11/03/19 0400  piperacillin-tazobactam (ZOSYN) IVPB 3.375 g     Discontinue     3.375 g 12.5 mL/hr over 240 Minutes Intravenous Every 8 hours 11/03/19 0240     11/02/19 2000  metroNIDAZOLE (FLAGYL) IVPB 500 mg  Status:  Discontinued        500 mg 100 mL/hr over 60 Minutes Intravenous Every 8 hours 11/02/19 1602 11/03/19 0223   11/02/19 1515  cefTRIAXone (ROCEPHIN) 1 g in  sodium chloride 0.9 % 100 mL IVPB        1 g 200 mL/hr over 30 Minutes Intravenous  Once 11/02/19 1504 11/02/19 1643   11/02/19 1515  metroNIDAZOLE (FLAGYL) IVPB 500 mg        500 mg 100 mL/hr over 60 Minutes Intravenous  Once 11/02/19 1504 11/02/19 1753       Assessment/Plan Hx of HTN Hypokalemia - resolved  - Per TRH -   Diverticulitis with multiple abscess Possible devolping colovesical fistula - CT with multiple associated abscess collections extending inferior and anterior to the affected segment of the sigmoid colon. Tthe major components appear to be contiguous from just above the level of the bladder to the anterior abdominal wall - s/p IR perc drain x 2 11/04/19. Cx's pending  - Continue bowel rest, IV abx, allow ice chips. Hopefully can be adv to [facility] tomorrow.  - Monitor vitals. Some tachycardia and soft BP overnight. Improved this AM.  - Hopefully will be able to avoid surgical intervention during admission, as this would result in colostomy creation. Will need colonoscopy after resolution of this acute episode and outpatient follow up with colorectal surgery for sigmoid colectomy.  - OOB mobilize!   FEN - NPO, ice chips, IVF, Mg 1.5 this AM VTE - SCDs, okay for chemical prophylaxis from a general surgery standpoint  ID - Rocephin/Flagyl x 1. Zosyn 8/1 >> WBC up at 16.8   LOS: 3 days    [name] , [facility] Surgery 11/05/2019, 8:35 AM Please see Amion for pager number during day hours 7:00am-4:30pm

## 2019-11-05 NOTE — Progress Notes (Signed)
Referring Physician(s): Dr. Chipper Herb  Supervising Physician: Gilmer Mor  Patient Status:  [facility] - In-pt  Chief Complaint: Diverticulitis with multiple abscesses; s/p left and right pelvic JP drains by Dr. Loreta Ave 11/04/19.   Subjective: Patient awake in bed, alert and oriented. No family at bedside. She is very friendly and conversational and happy with how much better she feels.   HPI: Patient presented to the ED 11/02/19 with lower abdominal pain and swelling. CT imaging positive for diverticulitis and multiple abscess collections. Left and right pelvic drains placed 8/3 by Interventional Radiology.   Allergies: Patient has no known allergies.  Medications: Prior to Admission medications   Medication Sig Start Date End Date Taking? Authorizing Provider  ALPRAZolam Prudy Feeler) 0.5 MG tablet Take 0.5 mg by mouth 3 (three) times daily as needed for anxiety.   Yes [provider]  hyoscyamine (LEVSIN) 0.125 MG tablet Take 0.125 mg by mouth every 6 (six) hours as needed for bladder spasms or cramping.  10/21/19  Yes [provider]  losartan-hydrochlorothiazide (HYZAAR) 50-12.5 MG per tablet Take 1 tablet by mouth daily.   Yes [provider]  promethazine (PHENERGAN) 25 MG tablet Take 25 mg by mouth 2 (two) times daily as needed for nausea or vomiting.  10/21/19  Yes [provider]  ciprofloxacin (CIPRO) 500 MG tablet Take 1 tablet (500 mg total) by mouth 2 (two) times daily. One po bid x 7 days Patient not taking: Reported on 11/02/2019 10/05/12   Doug Sou, MD  metroNIDAZOLE (FLAGYL) 500 MG tablet Take 1 tablet (500 mg total) by mouth 3 (three) times daily. Patient not taking: Reported on 11/02/2019 10/05/12   Doug Sou, MD  ondansetron (ZOFRAN) 8 MG tablet Take 1 tablet (8 mg total) by mouth every 8 (eight) hours as needed for nausea. Patient not taking: Reported on 11/02/2019 10/05/12   Doug Sou, MD     Vital Signs: BP 105/70 (BP Location: Left  Arm)   Pulse (!) 104   Temp 98.8 F (37.1 C) (Oral)   Resp 18   Ht 5\' 6"  (1.676 m)   Wt 209 lb 12.8 oz (95.2 kg)   SpO2 100%   BMI 33.86 kg/m   Physical Exam Constitutional:      General: She is not in acute distress. Cardiovascular:     Rate and Rhythm: Regular rhythm. Tachycardia present.  Pulmonary:     Effort: Pulmonary effort is normal.  Abdominal:     Palpations: Abdomen is soft.     Comments: Left and right pelvic drains in place. Serosanguineous/purulent fluid in each bulb. Dressings are clean and dry. Both drains easily flushed with 5 cc NS.   Skin:    General: Skin is warm and dry.  Neurological:     Mental Status: She is alert and oriented to person, place, and time.     Imaging: CT ABDOMEN PELVIS W CONTRAST  Result Date: 11/02/2019 CLINICAL DATA:  Abdominal pain. EXAM: CT ABDOMEN AND PELVIS WITH CONTRAST TECHNIQUE: Multidetector CT imaging of the abdomen and pelvis was performed using the standard protocol following bolus administration of intravenous contrast. CONTRAST:  OMNIPAQUE IOHEXOL 300 MG/ML  SOLN COMPARISON:  CT abdomen dated 10/05/2012 FINDINGS: Lower chest: No acute abnormality. Hepatobiliary: No focal liver abnormality is seen. No gallstones, gallbladder wall thickening, or biliary dilatation. Pancreas: Unremarkable. No pancreatic ductal dilatation or surrounding inflammatory changes. Spleen: Normal in size without focal abnormality. Adrenals/Urinary Tract: Adrenal glands appear normal. Kidneys appear normal without mass,  stone or hydronephrosis. No obstructing ureteral or bladder calculi are identified. Stomach/Bowel: Marked thickening of the walls of the sigmoid colon indicating acute diverticulitis. Multiple abscess collections extending inferior and anterior to the affected segment of the sigmoid colon. These abscess collections are potentially multiloculated but the major components appear to be contiguous from just above the level of the bladder to  the anterior abdominal wall, overall measuring approximately 12 cm AP extent (series 3, image 70). The component of the abscess collection within the anterior abdominal wall measures 7.2 x 3.2 x 5.8 cm (craniocaudal by AP by transverse dimensions) (series 3, image 71; coronal series 6, image 18). The most inferior component of the abscess collection abuts the dome of the bladder, and there is thickening of the underlying bladder wall, suggesting a developing colovesical fistula (axial series 3, images 70 through 75). Extensive diverticulosis throughout the colon, but no other site of acute/subacute diverticulitis. No bowel obstruction. Vascular/Lymphatic: No significant vascular findings are present. No enlarged abdominal or pelvic lymph nodes. Reproductive: Presumed hysterectomy.  No adnexal mass or free fluid. Other: No free intraperitoneal air identified. Musculoskeletal: No acute or suspicious osseous finding. IMPRESSION: 1. Acute or subacute diverticulitis of the sigmoid colon. 2. Multiple associated abscess collections extending inferior and anterior to the affected segment of the sigmoid colon. These abscess collections are potentially multiloculated but the major components appear to be contiguous from just above the level of the bladder to the anterior abdominal wall, overall measuring approximately 12 cm AP extent. The component of the abscess collection within the anterior abdominal wall measures 7.2 x 3.2 x 5.8 cm. The most inferior component of the abscess collection abuts the dome of the bladder, and there is thickening of the underlying bladder wall, suggesting a developing colovesical fistula. Given the distortion of the sigmoid colon loops/walls, I suspect colocolic fistulas as well. 3. Extensive diverticulosis. 4. No free intraperitoneal air identified. Electronically Signed   By: Bary Richard M.D.   On: 11/02/2019 14:21   CT IMAGE GUIDED DRAINAGE BY PERCUTANEOUS CATHETER  Result Date:  11/04/2019 INDICATION: 54 year old female with a history of pelvic abscess, trans spatial EXAM: CT GUIDED DRAINAGE OF  ABSCESS MEDICATIONS: The patient is currently admitted to the hospital and receiving intravenous antibiotics. The antibiotics were administered within an appropriate time frame prior to the initiation of the procedure. ANESTHESIA/SEDATION: 1.0 mg IV Versed 50 mcg IV Fentanyl Moderate Sedation Time:  21 minutes The patient was continuously monitored during the procedure by the interventional radiology nurse under my direct supervision. COMPLICATIONS: None TECHNIQUE: Informed written consent was obtained from the patient after a thorough discussion of the procedural risks, benefits and alternatives. All questions were addressed. Maximal Sterile Barrier Technique was utilized including caps, mask, sterile gowns, sterile gloves, sterile drape, hand hygiene and skin antiseptic. A timeout was performed prior to the initiation of the procedure. PROCEDURE: The operative field was prepped with Chlorhexidine in a sterile fashion, and a sterile drape was applied covering the operative field. A sterile gown and sterile gloves were used for the procedure. Local anesthesia was provided with 1% Lidocaine. Once the patient is prepped and draped in the usual sterile fashion, 1% lidocaine was used for local anesthesia. Two parallel 18 gauge trocar needles were advanced with CT guidance into the deep abscess and the superficial component of the abscess. Using modified Seldinger technique, a 12 French drain was placed into the deep abscess, and separately into the superficial abscess. Approximately 60 cc of purulent material aspirated  from the deep abscess with a culture sent. Both catheters were sutured in position and attached to bulb suction drainage. Final CT was acquired. Patient tolerated the procedure well and remained hemodynamically stable throughout. No complications were encountered and no significant blood  loss. FINDINGS: CT demonstrates a multi spatial abscess within the pelvis, similar to the comparison CT. After drainage placement, there is a 21 [place] drain from anterior midline approach to the deep component, and a tangential right of midline 12 French drain into the superficial component. Both are attached to bulb drainage. IMPRESSION: Status post CT-guided drainage with placement of 2 drainage catheters into a multi spatial abscess of the pelvis. Signed, Yvone Neu. Reyne Dumas, RPVI [facility] Electronically Signed   By: Gilmer Mor D.O.   On: 11/04/2019 10:03   CT IMAGE GUIDED DRAINAGE BY PERCUTANEOUS CATHETER  Result Date: 11/04/2019 INDICATION: 54 year old female with a history of pelvic abscess, trans spatial EXAM: CT GUIDED DRAINAGE OF  ABSCESS MEDICATIONS: The patient is currently admitted to the hospital and receiving intravenous antibiotics. The antibiotics were administered within an appropriate time frame prior to the initiation of the procedure. ANESTHESIA/SEDATION: 1.0 mg IV Versed 50 mcg IV Fentanyl Moderate Sedation Time:  21 minutes The patient was continuously monitored during the procedure by the interventional radiology nurse under my direct supervision. COMPLICATIONS: None TECHNIQUE: Informed written consent was obtained from the patient after a thorough discussion of the procedural risks, benefits and alternatives. All questions were addressed. Maximal Sterile Barrier Technique was utilized including caps, mask, sterile gowns, sterile gloves, sterile drape, hand hygiene and skin antiseptic. A timeout was performed prior to the initiation of the procedure. PROCEDURE: The operative field was prepped with Chlorhexidine in a sterile fashion, and a sterile drape was applied covering the operative field. A sterile gown and sterile gloves were used for the procedure. Local anesthesia was provided with 1% Lidocaine. Once the patient is  prepped and draped in the usual sterile fashion, 1% lidocaine was used for local anesthesia. Two parallel 18 gauge trocar needles were advanced with CT guidance into the deep abscess and the superficial component of the abscess. Using modified Seldinger technique, a 12 French drain was placed into the deep abscess, and separately into the superficial abscess. Approximately 60 cc of purulent material aspirated from the deep abscess with a culture sent. Both catheters were sutured in position and attached to bulb suction drainage. Final CT was acquired. Patient tolerated the procedure well and remained hemodynamically stable throughout. No complications were encountered and no significant blood loss. FINDINGS: CT demonstrates a multi spatial abscess within the pelvis, similar to the comparison CT. After drainage placement, there is a 52 [place] drain from anterior midline approach to the deep component, and a tangential right of midline 12 French drain into the superficial component. Both are attached to bulb drainage. IMPRESSION: Status post CT-guided drainage with placement of 2 drainage catheters into a multi spatial abscess of the pelvis. Signed, Yvone Neu. Reyne Dumas, RPVI [facility] Radiology Electronically Signed   By: Gilmer Mor D.O.   On: 11/04/2019 10:03    Labs:  CBC: Recent Labs    11/02/19 0617 11/03/19 0352 11/04/19 0428 11/05/19 0433  WBC 18.6* 12.6* 13.7* 16.8*  HGB 9.9* 8.4* 8.2* 8.0*  HCT 31.3* 24.6* 23.5* 23.7*  PLT 435* 328 342 382    COAGS: Recent Labs    11/04/19 0428  INR 1.4*    BMP:  Recent Labs    11/02/19 0617 11/03/19 0352 11/04/19 0428 11/05/19 0433  NA 132* 133* 138 134*  K 3.1* 3.0* 4.4 3.4*  CL 98 101 108 104  CO2 17* 23 24 24   GLUCOSE 106* 107* 100* 106*  BUN 7 9 10  5*  CALCIUM 8.7* 7.9* 8.3* 7.9*  CREATININE 0.80 0.63 0.64 0.65  GFRNONAA >60 >60 >60 >60  GFRAA >60 >60 >60 >60    LIVER FUNCTION  TESTS: Recent Labs    11/02/19 0617 11/04/19 0428  BILITOT 1.7* 0.9  AST 17 14*  ALT 13 13  ALKPHOS 72 59  PROT 7.7 5.5*  ALBUMIN 2.6* 1.7*    Assessment and Plan:  Diverticulitis with multiple abdominal abscesses: Left and right JP drains are draining well. 40 cc output document for the right, 60 cc documented from the left. Patient feels significantly better. WBC count 16.8, patient is afebrile. She was advanced to a clear liquid diet today.   Unsure about possible discharge date but the patient wanted to learn more about how to care for the drains at home. Time spent with the patient going over drain care - flushing, re-charging the bulb, documenting output, keeping the site clean and dry.   Continue current drain orders - flush with 5 cc NS every shift, document output, change dressing daily or as needed.   IR will continue to follow. General Surgery also following.   Electronically Signed: 01/02/20, AGACNP-BC [phone] 11/05/2019, 12:59 PM   I spent a total of 25 Minutes at the the patient's bedside AND on the patient's hospital floor or unit, greater than 50% of which was counseling/coordinating care for diverticular abscess drains.

## 2019-11-05 NOTE — Progress Notes (Signed)
Patient ID: [name], female   DOB: 27-Jul-1965, 54 y.o.   MRN: [id number]  PROGRESS NOTE    [name]  ZYS:[id number] DOB: 1965/12/14 DOA: 11/02/2019 PCP: Soundra Pilon, FNP   Brief Narrative:  .-year-old female with history of hypertension, recurrent diverticulitis presented with abdominal pain.  On presentation, WBCs 18.6.  CT abdomen with contrast showed acute diverticulitis with multiple abscesses.  She was started on antibiotics.  General surgery was consulted.  Assessment & Plan:   Acute recurrent diverticulitis with multiple intra-abdominal abscesses Developing colovesical fistula -CT abdomen on presentation showed acute diverticulitis with multiple abscesses with concern for developing colovesical fistula -General surgery has been consulted and recommend IR for possible drain placement.   -Status post percutaneous drain placement by IR on 11/04/2019.  Abdominal abscess culture is growing gram-positive cocci in pairs and gram-negative rods.  Blood culture remain negative.  Patient is less abdominal pain but improving.  Patient was seen by general surgery and started on clears.  Continue Zosyn and follow culture and tailor antibiotics accordingly.  Has worsened leukocytosis today.  Hyponatremia -Mild and stable.  HCTZ on hold.  Stop IV fluids.  Hypokalemia 3.4.  Replaced again today.  Hypertension: Controlled. -HCTZ and ACE inhibitor held.  Continue as needed hydralazine.  DVT prophylaxis: Lovenox on hold for now, resume once cleared by IR.  SCDs Code Status: Full Family Communication: Patient at bedside along with mother and sister Disposition Plan: Status is: Inpatient  Remains inpatient appropriate because:Inpatient level of care appropriate due to severity of illness   Dispo: The patient is from: Home              Anticipated d/c is to: Home              Anticipated d/c date is: > 3 days              Patient currently is not medically stable to  d/c.   Consultants: General surgery/IR  Procedures: Intra-abdominal percutaneous drain placed  Antimicrobials:  Anti-infectives (From admission, onward)   Start     Dose/Rate Route Frequency Ordered Stop   11/03/19 0400  piperacillin-tazobactam (ZOSYN) IVPB 3.375 g     Discontinue     3.375 g 12.5 mL/hr over 240 Minutes Intravenous Every 8 hours 11/03/19 0240     11/02/19 2000  metroNIDAZOLE (FLAGYL) IVPB 500 mg  Status:  Discontinued        500 mg 100 mL/hr over 60 Minutes Intravenous Every 8 hours 11/02/19 1602 11/03/19 0223   11/02/19 1515  cefTRIAXone (ROCEPHIN) 1 g in sodium chloride 0.9 % 100 mL IVPB        1 g 200 mL/hr over 30 Minutes Intravenous  Once 11/02/19 1504 11/02/19 1643   11/02/19 1515  metroNIDAZOLE (FLAGYL) IVPB 500 mg        500 mg 100 mL/hr over 60 Minutes Intravenous  Once 11/02/19 1504 11/02/19 1753       Subjective: Patient seen and examined.  Overall feels better but still has some pain in the lower abdomen.  No nausea.  No other complaint.  Objective: Vitals:   11/05/19 0356 11/05/19 0357 11/05/19 0812 11/05/19 1153  BP:  96/64 104/72 105/70  Pulse:  (!) 112 (!) 106 (!) 104  Resp: 18  18 18   Temp: 99.4 F (37.4 C)  98.9 F (37.2 C) 98.8 F (37.1 C)  TempSrc: Oral  Oral Oral  SpO2:  91% 100% 100%  Weight:  95.2 kg  Height:        Intake/Output Summary (Last 24 hours) at 11/05/2019 1443 Last data filed at 11/05/2019 1013 Gross per 24 hour  Intake 1461.86 ml  Output 160 ml  Net 1301.86 ml   Filed Weights   11/03/19 0336 11/04/19 0244 11/05/19 0357  Weight: (!) 92.5 kg 93.4 kg 95.2 kg    Examination:  General exam: Appears calm and comfortable  Respiratory system: Clear to auscultation. Respiratory effort normal. Cardiovascular system: S1 & S2 heard, RRR. No JVD, murmurs, rubs, gallops or clicks. No pedal edema. Gastrointestinal system: Abdomen is nondistended, soft and lower abdominal tenderness. No organomegaly or masses felt.  Normal bowel sounds heard.  2 percutaneous drains with slightly bloody and serous fluid. Central nervous system: Alert and oriented. No focal neurological deficits. Extremities: Symmetric 5 x 5 power. Skin: No rashes, lesions or ulcers.  Psychiatry: Judgement and insight appear normal. Mood & affect appropriate.   Data Reviewed: I have personally reviewed following labs and imaging studies  CBC: Recent Labs  Lab 11/02/19 0617 11/03/19 0352 11/04/19 0428 11/05/19 0433  WBC 18.6* 12.6* 13.7* 16.8*  NEUTROABS 14.3*  --  11.1* 13.2*  HGB 9.9* 8.4* 8.2* 8.0*  HCT 31.3* 24.6* 23.5* 23.7*  MCV 72.1* 69.5* 69.3* 69.1*  PLT 435* 328 342 382   Basic Metabolic Panel: Recent Labs  Lab 11/02/19 0617 11/03/19 0352 11/04/19 0428 11/05/19 0433  NA 132* 133* 138 134*  K 3.1* 3.0* 4.4 3.4*  CL 98 101 108 104  CO2 17* 23 24 24   GLUCOSE 106* 107* 100* 106*  BUN 7 9 10  5*  CREATININE 0.80 0.63 0.64 0.65  CALCIUM 8.7* 7.9* 8.3* 7.9*  MG  --   --  1.8 1.5*   GFR: Estimated Creatinine Clearance: 92.4 mL/min (by C-G formula based on SCr of 0.65 mg/dL). Liver Function Tests: Recent Labs  Lab 11/02/19 0617 11/04/19 0428  AST 17 14*  ALT 13 13  ALKPHOS 72 59  BILITOT 1.7* 0.9  PROT 7.7 5.5*  ALBUMIN 2.6* 1.7*   No results for input(s): LIPASE, AMYLASE in the last 168 hours. No results for input(s): AMMONIA in the last 168 hours. Coagulation Profile: Recent Labs  Lab 11/04/19 0428  INR 1.4*   Cardiac Enzymes: No results for input(s): CKTOTAL, CKMB, CKMBINDEX, TROPONINI in the last 168 hours. BNP (last 3 results) No results for input(s): PROBNP in the last 8760 hours. HbA1C: No results for input(s): HGBA1C in the last 72 hours. CBG: No results for input(s): GLUCAP in the last 168 hours. Lipid Profile: No results for input(s): CHOL, HDL, LDLCALC, TRIG, CHOLHDL, LDLDIRECT in the last 72 hours. Thyroid Function Tests: No results for input(s): TSH, T4TOTAL, FREET4, T3FREE,  THYROIDAB in the last 72 hours. Anemia Panel: No results for input(s): VITAMINB12, FOLATE, FERRITIN, TIBC, IRON, RETICCTPCT in the last 72 hours. Sepsis Labs: Recent Labs  Lab 11/02/19 1412 11/02/19 1523 11/03/19 0352  LATICACIDVEN 2.2* 1.5 1.0    Recent Results (from the past 240 hour(s))  SARS Coronavirus 2 by RT PCR (hospital order, performed in [facility] hospital lab) Nasopharyngeal Nasopharyngeal Swab     Status: None   Collection Time: 11/02/19  3:59 PM   Specimen: Nasopharyngeal Swab  Result Value Ref Range Status   SARS Coronavirus 2 NEGATIVE NEGATIVE Final    Comment: (NOTE) SARS-CoV-2 target nucleic acids are NOT DETECTED.  The SARS-CoV-2 RNA is generally detectable in upper and lower respiratory specimens during the acute phase of infection. The  lowest concentration of SARS-CoV-2 viral copies this assay can detect is 250 copies / mL. A negative result does not preclude SARS-CoV-2 infection and should not be used as the sole basis for treatment or other patient management decisions.  A negative result may occur with improper specimen collection / handling, submission of specimen other than nasopharyngeal swab, presence of viral mutation(s) within the areas targeted by this assay, and inadequate number of viral copies (<250 copies / mL). A negative result must be combined with clinical observations, patient history, and epidemiological information.  Fact Sheet for Patients:   BoilerBrush.com.cy  Fact Sheet for Healthcare Providers: https://pope.com/  This test is not yet approved or  cleared by the [place] FDA and has been authorized for detection and/or diagnosis of SARS-CoV-2 by FDA under an Emergency Use Authorization (EUA).  This EUA will remain in effect (meaning this test can be used) for the duration of the COVID-19 declaration under Section 564(b)(1) of the Act, 21 U.S.C. section 360bbb-3(b)(1), unless  the authorization is terminated or revoked sooner.  Performed at [facility], [address] [place], [place] [address]   Culture, blood (routine x 2)     Status: None (Preliminary result)   Collection Time: 11/03/19  3:52 AM   Specimen: BLOOD  Result Value Ref Range Status   Specimen Description BLOOD  Final   Special Requests   Final    LEFT CEPHELIC BOTTLES DRAWN AEROBIC AND ANAEROBIC Blood Culture adequate volume   Culture   Final    NO GROWTH 2 DAYS Performed at [facility], [address] [place], [place] [address]    Report Status PENDING  Incomplete  Culture, blood (routine x 2)     Status: None (Preliminary result)   Collection Time: 11/03/19  4:02 AM   Specimen: BLOOD  Result Value Ref Range Status   Specimen Description BLOOD LEFT ANTECUBITAL  Final   Special Requests   Final    BOTTLES DRAWN AEROBIC AND ANAEROBIC Blood Culture results may not be optimal due to an excessive volume of blood received in culture bottles   Culture   Final    NO GROWTH 2 DAYS Performed at [facility], [address], [place], [place] [address]    Report Status PENDING  Incomplete  Aerobic/Anaerobic Culture (surgical/deep wound)     Status: None (Preliminary result)   Collection Time: 11/04/19  9:53 AM   Specimen: Abscess  Result Value Ref Range Status   Specimen Description ABSCESS  Final   Special Requests ABDOMEN  Final   Gram Stain   Final    ABUNDANT WBC PRESENT, PREDOMINANTLY PMN ABUNDANT GRAM POSITIVE COCCI IN PAIRS MODERATE GRAM VARIABLE ROD Performed at [facility], [address] [place], [place] [address]    Culture   Final    MODERATE GRAM NEGATIVE RODS MODERATE PSEUDOMONAS AERUGINOSA    Report Status PENDING  Incomplete         Radiology Studies: CT IMAGE GUIDED DRAINAGE BY PERCUTANEOUS CATHETER  Result Date: 11/04/2019 INDICATION: 54 year old female with a history of pelvic abscess, trans spatial EXAM: CT GUIDED DRAINAGE OF   ABSCESS MEDICATIONS: The patient is currently admitted to the hospital and receiving intravenous antibiotics. The antibiotics were administered within an appropriate time frame prior to the initiation of the procedure. ANESTHESIA/SEDATION: 1.0 mg IV Versed 50 mcg IV Fentanyl Moderate Sedation Time:  21 minutes The patient was continuously monitored during the procedure by the interventional  radiology nurse under my direct supervision. COMPLICATIONS: None TECHNIQUE: Informed written consent was obtained from the patient after a thorough discussion of the procedural risks, benefits and alternatives. All questions were addressed. Maximal Sterile Barrier Technique was utilized including caps, mask, sterile gowns, sterile gloves, sterile drape, hand hygiene and skin antiseptic. A timeout was performed prior to the initiation of the procedure. PROCEDURE: The operative field was prepped with Chlorhexidine in a sterile fashion, and a sterile drape was applied covering the operative field. A sterile gown and sterile gloves were used for the procedure. Local anesthesia was provided with 1% Lidocaine. Once the patient is prepped and draped in the usual sterile fashion, 1% lidocaine was used for local anesthesia. Two parallel 18 gauge trocar needles were advanced with CT guidance into the deep abscess and the superficial component of the abscess. Using modified Seldinger technique, a 12 French drain was placed into the deep abscess, and separately into the superficial abscess. Approximately 60 cc of purulent material aspirated from the deep abscess with a culture sent. Both catheters were sutured in position and attached to bulb suction drainage. Final CT was acquired. Patient tolerated the procedure well and remained hemodynamically stable throughout. No complications were encountered and no significant blood loss. FINDINGS: CT demonstrates a multi spatial abscess within the pelvis, similar to the comparison CT. After drainage  placement, there is a 61 [place] drain from anterior midline approach to the deep component, and a tangential right of midline 12 French drain into the superficial component. Both are attached to bulb drainage. IMPRESSION: Status post CT-guided drainage with placement of 2 drainage catheters into a multi spatial abscess of the pelvis. Signed, Yvone Neu. Reyne Dumas, RPVI [facility] Electronically Signed   By: Gilmer Mor D.O.   On: 11/04/2019 10:03   CT IMAGE GUIDED DRAINAGE BY PERCUTANEOUS CATHETER  Result Date: 11/04/2019 INDICATION: 54 year old female with a history of pelvic abscess, trans spatial EXAM: CT GUIDED DRAINAGE OF  ABSCESS MEDICATIONS: The patient is currently admitted to the hospital and receiving intravenous antibiotics. The antibiotics were administered within an appropriate time frame prior to the initiation of the procedure. ANESTHESIA/SEDATION: 1.0 mg IV Versed 50 mcg IV Fentanyl Moderate Sedation Time:  21 minutes The patient was continuously monitored during the procedure by the interventional radiology nurse under my direct supervision. COMPLICATIONS: None TECHNIQUE: Informed written consent was obtained from the patient after a thorough discussion of the procedural risks, benefits and alternatives. All questions were addressed. Maximal Sterile Barrier Technique was utilized including caps, mask, sterile gowns, sterile gloves, sterile drape, hand hygiene and skin antiseptic. A timeout was performed prior to the initiation of the procedure. PROCEDURE: The operative field was prepped with Chlorhexidine in a sterile fashion, and a sterile drape was applied covering the operative field. A sterile gown and sterile gloves were used for the procedure. Local anesthesia was provided with 1% Lidocaine. Once the patient is prepped and draped in the usual sterile fashion, 1% lidocaine was used for local anesthesia. Two parallel 18 gauge trocar  needles were advanced with CT guidance into the deep abscess and the superficial component of the abscess. Using modified Seldinger technique, a 12 French drain was placed into the deep abscess, and separately into the superficial abscess. Approximately 60 cc of purulent material aspirated from the deep abscess with a culture sent. Both catheters were sutured in position and attached to bulb suction drainage. Final CT was acquired. Patient tolerated the procedure well and  remained hemodynamically stable throughout. No complications were encountered and no significant blood loss. FINDINGS: CT demonstrates a multi spatial abscess within the pelvis, similar to the comparison CT. After drainage placement, there is a 74 [place] drain from anterior midline approach to the deep component, and a tangential right of midline 12 French drain into the superficial component. Both are attached to bulb drainage. IMPRESSION: Status post CT-guided drainage with placement of 2 drainage catheters into a multi spatial abscess of the pelvis. Signed, Yvone Neu. Reyne Dumas, RPVI [facility] Radiology Electronically Signed   By: Gilmer Mor D.O.   On: 11/04/2019 10:03    Scheduled Meds: . sodium chloride flush  5 mL Intracatheter Q8H   Continuous Infusions: . sodium chloride 75 mL/hr at 11/05/19 0352  . piperacillin-tazobactam (ZOSYN)  IV 3.375 g (11/05/19 1344)   Hughie Closs, MD [facility] 11/05/2019, 2:43 PM

## 2019-11-06 DIAGNOSIS — A419 Sepsis, unspecified organism: Secondary | ICD-10-CM

## 2019-11-06 DIAGNOSIS — R6520 Severe sepsis without septic shock: Secondary | ICD-10-CM

## 2019-11-06 HISTORY — DX: Sepsis, unspecified organism: A41.9

## 2019-11-06 HISTORY — DX: Severe sepsis without septic shock: R65.20

## 2019-11-06 LAB — CBC WITH DIFFERENTIAL/PLATELET
Abs Immature Granulocytes: 0.44 10*3/uL — ABNORMAL HIGH (ref 0.00–0.07)
Basophils Absolute: 0.1 10*3/uL (ref 0.0–0.1)
Basophils Relative: 1 %
Eosinophils Absolute: 0.3 10*3/uL (ref 0.0–0.5)
Eosinophils Relative: 2 %
HCT: 22.5 % — ABNORMAL LOW (ref 36.0–46.0)
Hemoglobin: 7.8 g/dL — ABNORMAL LOW (ref 12.0–15.0)
Immature Granulocytes: 3 %
Lymphocytes Relative: 16 %
Lymphs Abs: 2.2 10*3/uL (ref 0.7–4.0)
MCH: 23.8 pg — ABNORMAL LOW (ref 26.0–34.0)
MCHC: 34.7 g/dL (ref 30.0–36.0)
MCV: 68.6 fL — ABNORMAL LOW (ref 80.0–100.0)
Monocytes Absolute: 1.1 10*3/uL — ABNORMAL HIGH (ref 0.1–1.0)
Monocytes Relative: 8 %
Neutro Abs: 9.8 10*3/uL — ABNORMAL HIGH (ref 1.7–7.7)
Neutrophils Relative %: 70 %
Platelets: 389 10*3/uL (ref 150–400)
RBC: 3.28 MIL/uL — ABNORMAL LOW (ref 3.87–5.11)
RDW: 16.9 % — ABNORMAL HIGH (ref 11.5–15.5)
WBC: 14 10*3/uL — ABNORMAL HIGH (ref 4.0–10.5)
nRBC: 0 % (ref 0.0–0.2)

## 2019-11-06 LAB — BASIC METABOLIC PANEL
Anion gap: 10 (ref 5–15)
BUN: <5 mg/dL — ABNORMAL LOW (ref 6–20)
CO2: 25 mmol/L (ref 22–32)
Calcium: 8 mg/dL — ABNORMAL LOW (ref 8.9–10.3)
Chloride: 102 mmol/L (ref 98–111)
Creatinine, Ser: 0.62 mg/dL (ref 0.44–1.00)
GFR calc Af Amer: >60 mL/min (ref >60)
GFR calc non Af Amer: >60 mL/min (ref >60)
Glucose, Bld: 101 mg/dL — ABNORMAL HIGH (ref 70–99)
Potassium: 3.6 mmol/L (ref 3.5–5.1)
Sodium: 137 mmol/L (ref 135–145)

## 2019-11-06 LAB — MAGNESIUM: Magnesium: 1.8 mg/dL (ref 1.7–2.4)

## 2019-11-06 MED ORDER — ACETAMINOPHEN 500 MG PO TABS
1000.0000 mg | ORAL_TABLET | Freq: Three times a day (TID) | ORAL | Status: DC
  Administered 2019-11-06: 1000 mg via ORAL
  Filled 2019-11-06 (×3): qty 2

## 2019-11-06 MED ORDER — HYDROMORPHONE HCL 1 MG/ML IJ SOLN
0.5000 mg | INTRAMUSCULAR | Status: DC | PRN
  Administered 2019-11-06 – 2019-11-08 (×5): 1 mg via INTRAVENOUS
  Filled 2019-11-06 (×5): qty 1

## 2019-11-06 MED ORDER — OXYCODONE HCL 5 MG PO TABS
5.0000 mg | ORAL_TABLET | ORAL | Status: DC | PRN
  Administered 2019-11-09: 10 mg via ORAL
  Administered 2019-11-09: 5 mg via ORAL
  Administered 2019-11-10: 10 mg via ORAL
  Filled 2019-11-06 (×2): qty 1
  Filled 2019-11-06 (×2): qty 2

## 2019-11-06 MED ORDER — ENOXAPARIN SODIUM 40 MG/0.4ML ~~LOC~~ SOLN
40.0000 mg | SUBCUTANEOUS | Status: DC
  Administered 2019-11-06 – 2019-11-10 (×5): 40 mg via SUBCUTANEOUS
  Filled 2019-11-06 (×5): qty 0.4

## 2019-11-06 MED ORDER — KATE FARMS STANDARD 1.4 PO LIQD
325.0000 mL | Freq: Two times a day (BID) | ORAL | Status: DC
  Administered 2019-11-07 – 2019-11-10 (×2): 325 mL via ORAL
  Filled 2019-11-06 (×9): qty 325

## 2019-11-06 NOTE — Progress Notes (Signed)
Referring Physician(s): Mikey College  Supervising Physician: Simonne Come  Patient Status:  [facility] - In-pt  Chief Complaint:  Diverticular abscess  Brief History: [name] is a 53 y.o. female with a past medical history that includes HTN and diverticulitis.   She presented to the ED 11/02/19 with complaints of pain and swelling in her lower abdomen.   ED work up included leukocytosis (WBC 18.6) and lactic acid of 2.2.   She underwent drain placement on 11/04/19 by Dr. Loreta Ave.  Subjective:  Doing well today. She tells me she doesn't think her drains have been flushed.  Allergies: Patient has no known allergies.  Medications: Prior to Admission medications   Medication Sig Start Date End Date Taking? Authorizing Provider  ALPRAZolam Prudy Feeler) 0.5 MG tablet Take 0.5 mg by mouth 3 (three) times daily as needed for anxiety.   Yes [provider]  hyoscyamine (LEVSIN) 0.125 MG tablet Take 0.125 mg by mouth every 6 (six) hours as needed for bladder spasms or cramping.  10/21/19  Yes [provider]  losartan-hydrochlorothiazide (HYZAAR) 50-12.5 MG per tablet Take 1 tablet by mouth daily.   Yes [provider]  promethazine (PHENERGAN) 25 MG tablet Take 25 mg by mouth 2 (two) times daily as needed for nausea or vomiting.  10/21/19  Yes [provider]  ciprofloxacin (CIPRO) 500 MG tablet Take 1 tablet (500 mg total) by mouth 2 (two) times daily. One po bid x 7 days Patient not taking: Reported on 11/02/2019 10/05/12   Doug Sou, MD  metroNIDAZOLE (FLAGYL) 500 MG tablet Take 1 tablet (500 mg total) by mouth 3 (three) times daily. Patient not taking: Reported on 11/02/2019 10/05/12   Doug Sou, MD  ondansetron (ZOFRAN) 8 MG tablet Take 1 tablet (8 mg total) by mouth every 8 (eight) hours as needed for nausea. Patient not taking: Reported on 11/02/2019 10/05/12   Doug Sou, MD     Vital Signs: BP 119/89 (BP Location: Left Arm)   Pulse (!) 111    Temp 97.9 F (36.6 C) (Oral)   Resp 18   Ht 5\' 6"  (1.676 m)   Wt 95.6 kg   SpO2 100%   BMI 34.02 kg/m   Physical Exam Vitals reviewed.  Constitutional:      Appearance: Normal appearance.  Cardiovascular:     Rate and Rhythm: Normal rate.  Pulmonary:     Effort: Pulmonary effort is normal. No respiratory distress.  Abdominal:     Palpations: Abdomen is soft.     Comments: Pelvic drains in place, sites ok.  ~55 mL output cloudy tan/redish drainage from drain #1 ~60 mL output cloud tan/redish drainage from drain #2  Neurological:     General: No focal deficit present.     Mental Status: She is alert and oriented to person, place, and time.  Psychiatric:        Mood and Affect: Mood normal.        Behavior: Behavior normal.        Thought Content: Thought content normal.        Judgment: Judgment normal.     Imaging: CT ABDOMEN PELVIS W CONTRAST  Result Date: 11/02/2019 CLINICAL DATA:  Abdominal pain. EXAM: CT ABDOMEN AND PELVIS WITH CONTRAST TECHNIQUE: Multidetector CT imaging of the abdomen and pelvis was performed using the standard protocol following bolus administration of intravenous contrast. CONTRAST:  01/02/2020 OMNIPAQUE IOHEXOL 300 MG/ML  SOLN COMPARISON:  CT abdomen dated 10/05/2012 FINDINGS: Lower chest: No  acute abnormality. Hepatobiliary: No focal liver abnormality is seen. No gallstones, gallbladder wall thickening, or biliary dilatation. Pancreas: Unremarkable. No pancreatic ductal dilatation or surrounding inflammatory changes. Spleen: Normal in size without focal abnormality. Adrenals/Urinary Tract: Adrenal glands appear normal. Kidneys appear normal without mass, stone or hydronephrosis. No obstructing ureteral or bladder calculi are identified. Stomach/Bowel: Marked thickening of the walls of the sigmoid colon indicating acute diverticulitis. Multiple abscess collections extending inferior and anterior to the affected segment of the sigmoid colon. These abscess  collections are potentially multiloculated but the major components appear to be contiguous from just above the level of the bladder to the anterior abdominal wall, overall measuring approximately 12 cm AP extent (series 3, image 70). The component of the abscess collection within the anterior abdominal wall measures 7.2 x 3.2 x 5.8 cm (craniocaudal by AP by transverse dimensions) (series 3, image 71; coronal series 6, image 18). The most inferior component of the abscess collection abuts the dome of the bladder, and there is thickening of the underlying bladder wall, suggesting a developing colovesical fistula (axial series 3, images 70 through 75). Extensive diverticulosis throughout the colon, but no other site of acute/subacute diverticulitis. No bowel obstruction. Vascular/Lymphatic: No significant vascular findings are present. No enlarged abdominal or pelvic lymph nodes. Reproductive: Presumed hysterectomy.  No adnexal mass or free fluid. Other: No free intraperitoneal air identified. Musculoskeletal: No acute or suspicious osseous finding. IMPRESSION: 1. Acute or subacute diverticulitis of the sigmoid colon. 2. Multiple associated abscess collections extending inferior and anterior to the affected segment of the sigmoid colon. These abscess collections are potentially multiloculated but the major components appear to be contiguous from just above the level of the bladder to the anterior abdominal wall, overall measuring approximately 12 cm AP extent. The component of the abscess collection within the anterior abdominal wall measures 7.2 x 3.2 x 5.8 cm. The most inferior component of the abscess collection abuts the dome of the bladder, and there is thickening of the underlying bladder wall, suggesting a developing colovesical fistula. Given the distortion of the sigmoid colon loops/walls, I suspect colocolic fistulas as well. 3. Extensive diverticulosis. 4. No free intraperitoneal air identified.  Electronically Signed   By: Bary Richard M.D.   On: 11/02/2019 14:21   CT IMAGE GUIDED DRAINAGE BY PERCUTANEOUS CATHETER  Result Date: 11/04/2019 INDICATION: 54 year old female with a history of pelvic abscess, trans spatial EXAM: CT GUIDED DRAINAGE OF  ABSCESS MEDICATIONS: The patient is currently admitted to the hospital and receiving intravenous antibiotics. The antibiotics were administered within an appropriate time frame prior to the initiation of the procedure. ANESTHESIA/SEDATION: 1.0 mg IV Versed 50 mcg IV Fentanyl Moderate Sedation Time:  21 minutes The patient was continuously monitored during the procedure by the interventional radiology nurse under my direct supervision. COMPLICATIONS: None TECHNIQUE: Informed written consent was obtained from the patient after a thorough discussion of the procedural risks, benefits and alternatives. All questions were addressed. Maximal Sterile Barrier Technique was utilized including caps, mask, sterile gowns, sterile gloves, sterile drape, hand hygiene and skin antiseptic. A timeout was performed prior to the initiation of the procedure. PROCEDURE: The operative field was prepped with Chlorhexidine in a sterile fashion, and a sterile drape was applied covering the operative field. A sterile gown and sterile gloves were used for the procedure. Local anesthesia was provided with 1% Lidocaine. Once the patient is prepped and draped in the usual sterile fashion, 1% lidocaine was used for local anesthesia. Two parallel 18 gauge  trocar needles were advanced with CT guidance into the deep abscess and the superficial component of the abscess. Using modified Seldinger technique, a 12 French drain was placed into the deep abscess, and separately into the superficial abscess. Approximately 60 cc of purulent material aspirated from the deep abscess with a culture sent. Both catheters were sutured in position and attached to bulb suction drainage. Final CT was acquired.  Patient tolerated the procedure well and remained hemodynamically stable throughout. No complications were encountered and no significant blood loss. FINDINGS: CT demonstrates a multi spatial abscess within the pelvis, similar to the comparison CT. After drainage placement, there is a 54 [place] drain from anterior midline approach to the deep component, and a tangential right of midline 12 French drain into the superficial component. Both are attached to bulb drainage. IMPRESSION: Status post CT-guided drainage with placement of 2 drainage catheters into a multi spatial abscess of the pelvis. Signed, Yvone Neu. Reyne Dumas, RPVI [facility] Radiology Electronically Signed   By: Gilmer Mor D.O.   On: 11/04/2019 10:03   CT IMAGE GUIDED DRAINAGE BY PERCUTANEOUS CATHETER  Result Date: 11/04/2019 INDICATION: 54 year old female with a history of pelvic abscess, trans spatial EXAM: CT GUIDED DRAINAGE OF  ABSCESS MEDICATIONS: The patient is currently admitted to the hospital and receiving intravenous antibiotics. The antibiotics were administered within an appropriate time frame prior to the initiation of the procedure. ANESTHESIA/SEDATION: 1.0 mg IV Versed 50 mcg IV Fentanyl Moderate Sedation Time:  21 minutes The patient was continuously monitored during the procedure by the interventional radiology nurse under my direct supervision. COMPLICATIONS: None TECHNIQUE: Informed written consent was obtained from the patient after a thorough discussion of the procedural risks, benefits and alternatives. All questions were addressed. Maximal Sterile Barrier Technique was utilized including caps, mask, sterile gowns, sterile gloves, sterile drape, hand hygiene and skin antiseptic. A timeout was performed prior to the initiation of the procedure. PROCEDURE: The operative field was prepped with Chlorhexidine in a sterile fashion, and a sterile drape was applied covering the  operative field. A sterile gown and sterile gloves were used for the procedure. Local anesthesia was provided with 1% Lidocaine. Once the patient is prepped and draped in the usual sterile fashion, 1% lidocaine was used for local anesthesia. Two parallel 18 gauge trocar needles were advanced with CT guidance into the deep abscess and the superficial component of the abscess. Using modified Seldinger technique, a 12 French drain was placed into the deep abscess, and separately into the superficial abscess. Approximately 60 cc of purulent material aspirated from the deep abscess with a culture sent. Both catheters were sutured in position and attached to bulb suction drainage. Final CT was acquired. Patient tolerated the procedure well and remained hemodynamically stable throughout. No complications were encountered and no significant blood loss. FINDINGS: CT demonstrates a multi spatial abscess within the pelvis, similar to the comparison CT. After drainage placement, there is a 68 [place] drain from anterior midline approach to the deep component, and a tangential right of midline 12 French drain into the superficial component. Both are attached to bulb drainage. IMPRESSION: Status post CT-guided drainage with placement of 2 drainage catheters into a multi spatial abscess of the pelvis. Signed, Yvone Neu. Reyne Dumas, RPVI [facility] Radiology Electronically Signed   By: Gilmer Mor D.O.   On: 11/04/2019 10:03    Labs:  CBC: Recent Labs    11/03/19 0352 11/04/19 0428 11/05/19  8325 11/06/19 0439  WBC 12.6* 13.7* 16.8* 14.0*  HGB 8.4* 8.2* 8.0* 7.8*  HCT 24.6* 23.5* 23.7* 22.5*  PLT 328 342 382 389    COAGS: Recent Labs    11/04/19 0428  INR 1.4*    BMP: Recent Labs    11/03/19 0352 11/04/19 0428 11/05/19 0433 11/06/19 0439  NA 133* 138 134* 137  K 3.0* 4.4 3.4* 3.6  CL 101 108 104 102  CO2 23 24 24 25   GLUCOSE 107* 100* 106* 101*    BUN 9 10 5* <5*  CALCIUM 7.9* 8.3* 7.9* 8.0*  CREATININE 0.63 0.64 0.65 0.62  GFRNONAA >60 >60 >60 >60  GFRAA >60 >60 >60 >60    LIVER FUNCTION TESTS: Recent Labs    11/02/19 0617 11/04/19 0428  BILITOT 1.7* 0.9  AST 17 14*  ALT 13 13  ALKPHOS 72 59  PROT 7.7 5.5*  ALBUMIN 2.6* 1.7*    Assessment and Plan:  Diverticular abscess  S/P 2 drains placed 11/04/19 by Dr. Loreta Ave.  Continue flushes.  Recommend CT when output < 10 mL per day.  Electronically Signed: Gwynneth Macleod, PA-C 11/06/2019, 12:39 PM    I spent a total of 15 Minutes at the the patient's bedside AND on the patient's hospital floor or unit, greater than 50% of which was counseling/coordinating care for F/U drains.

## 2019-11-06 NOTE — Progress Notes (Signed)
Initial Nutrition Assessment  RD working remotely.  DOCUMENTATION CODES:   Obesity unspecified  INTERVENTION:    Molli Posey Standard 1.4 Cal vanilla po BID between meals, each supplement provides 455 kcal and 20 grams of protein.  NUTRITION DIAGNOSIS:   Inadequate oral intake related to decreased appetite as evidenced by per patient/family report.  Ongoing   GOAL:   Patient will meet greater than or equal to 90% of their needs   Progressing   MONITOR:   Diet advancement, PO intake, Supplement acceptance, Labs, Weight trends, I & O's  REASON FOR ASSESSMENT:   Malnutrition Screening Tool    ASSESSMENT:   54 year old female with PMHx of HTN, recurrent diverticulitis admitted with acute diverticulitis with multiple abscesses.    S/P placement of left and right pelvic JP drains by IR on 8/3. No drain output recorded 8/4.  Diet advanced to full liquids this morning. Patient reports tolerating it well.  At home, she drinks a plant-based shake (Orgain) between meals. She does not like Ensure or Boost and prefers plant-based shakes. She agreed to try [employer] PO supplement between meals.  Medications reviewed.  Labs reviewed: BUN < 5  Nutrition Focused Physical Exam:    Most Recent Value  Orbital Region No depletion  Upper Arm Region No depletion  Thoracic and Lumbar Region No depletion  Buccal Region No depletion  Temple Region No depletion  Clavicle Bone Region No depletion  Clavicle and Acromion Bone Region No depletion  Scapular Bone Region No depletion  Dorsal Hand No depletion  Patellar Region No depletion  Anterior Thigh Region No depletion  Posterior Calf Region No depletion  Edema (RD Assessment) None  Hair Reviewed  Eyes Reviewed  Mouth Reviewed  Skin Reviewed  Nails Reviewed       Diet Order:   Diet Order            Diet full liquid Room service appropriate? Yes; Fluid consistency: Thin  Diet effective now                EDUCATION  NEEDS:   No education needs have been identified at this time  Skin:  Skin Assessment: Reviewed RN Assessment  Last BM:  8/3  Height:   Ht Readings from Last 1 Encounters:  11/02/19 5\' 6"  (1.676 m)   Weight:   Wt Readings from Last 1 Encounters:  11/06/19 95.6 kg   BMI:  Body mass index is 34.02 kg/m.  Estimated Nutritional Needs:   Kcal:  2100-2300  Protein:  110-120 grams  Fluid:  2.1-2.3 L/day   01/06/20, RD, LDN, CNSC Please refer to Amion for contact information.

## 2019-11-06 NOTE — Progress Notes (Addendum)
Patient ID: [name], female   DOB: 1965-05-18, 54 y.o.   MRN: [id number]  PROGRESS NOTE    Aolani Piggott  OQH:[id number] DOB: 1965-11-07 DOA: 11/02/2019 PCP: Kristen Loader, FNP   Brief Narrative:  .-year-old female with history of hypertension, recurrent diverticulitis presented with abdominal pain.  On presentation, WBCs 18.6.  CT abdomen with contrast showed acute diverticulitis with multiple abscesses.  She was started on antibiotics.  General surgery was consulted.  Assessment & Plan:   Severe sepsis secondary to acute recurrent diverticulitis with multiple intra-abdominal abscesses, present on admission Developing colovesical fistula: Patient met severe sepsis at the time of admission based on tachycardia, leukocytosis and lactic acid of 2.2. -CT abdomen on presentation showed acute diverticulitis with multiple abscesses with concern for developing colovesical fistula -General surgery has been consulted and recommend IR for possible drain placement.   -Status post percutaneous drain placement by IR on 11/04/2019.  Abdominal abscess culture is growing gram-positive cocci in pairs and gram-negative rods.  Blood culture remain negative.  Patient has no abdominal pain or tenderness today.  Patient was seen by general surgery.  Advanced diet to full liquid diet.  Continue Zosyn and tailor antibiotics accordingly.  Leukocytosis improving.  She remains afebrile.   Hyponatremia: Resolved.  Continue to hold HCTZ while her blood pressure is within normal range.  Hypokalemia: Resolved.  Hypertension: Controlled. -HCTZ and ACE inhibitor held.  Continue as needed hydralazine.  DVT prophylaxis: Lovenox Code Status: Full Family Communication: No family present at bedside.  Patient competent and alert. Disposition Plan: Status is: Inpatient  Remains inpatient appropriate because:Inpatient level of care appropriate due to severity of illness   Dispo: The patient is from: Home               Anticipated d/c is to: Home              Anticipated d/c date is: > 3 days              Patient currently is not medically stable to d/c.   Consultants: General surgery/IR  Procedures: Intra-abdominal percutaneous drain placed  Antimicrobials:  Anti-infectives (From admission, onward)   Start     Dose/Rate Route Frequency Ordered Stop   11/03/19 0400  piperacillin-tazobactam (ZOSYN) IVPB 3.375 g     Discontinue     3.375 g 12.5 mL/hr over 240 Minutes Intravenous Every 8 hours 11/03/19 0240     11/02/19 2000  metroNIDAZOLE (FLAGYL) IVPB 500 mg  Status:  Discontinued        500 mg 100 mL/hr over 60 Minutes Intravenous Every 8 hours 11/02/19 1602 11/03/19 0223   11/02/19 1515  cefTRIAXone (ROCEPHIN) 1 g in sodium chloride 0.9 % 100 mL IVPB        1 g 200 mL/hr over 30 Minutes Intravenous  Once 11/02/19 1504 11/02/19 1643   11/02/19 1515  metroNIDAZOLE (FLAGYL) IVPB 500 mg        500 mg 100 mL/hr over 60 Minutes Intravenous  Once 11/02/19 1504 11/02/19 1753       Subjective: Seen and examined.  No complaints.  Abdominal pain resolved.  Objective: Vitals:   11/05/19 2201 11/06/19 0020 11/06/19 0716 11/06/19 0729  BP: 116/80 103/78 119/89   Pulse: 93 97 (!) 105 (!) 111  Resp: 17 17 18    Temp: 97.9 F (36.6 C) 98.3 F (36.8 C) 97.9 F (36.6 C)   TempSrc: Oral Oral Oral   SpO2: 100% 95% 99% 100%  Weight:  95.6 kg  Height:        Intake/Output Summary (Last 24 hours) at 11/06/2019 1243 Last data filed at 11/06/2019 1050 Gross per 24 hour  Intake 736.14 ml  Output 55 ml  Net 681.14 ml   Filed Weights   11/04/19 0244 11/05/19 0357 11/06/19 0729  Weight: 93.4 kg 95.2 kg 95.6 kg    Examination:  General exam: Appears calm and comfortable  Respiratory system: Clear to auscultation. Respiratory effort normal. Cardiovascular system: S1 & S2 heard, RRR. No JVD, murmurs, rubs, gallops or clicks. No pedal edema. Gastrointestinal system: Abdomen is nondistended, soft and  nontender. No organomegaly or masses felt. Normal bowel sounds heard.  2 percutaneous intrarenal JP drains with serous and bloody fluid in them. Central nervous system: Alert and oriented. No focal neurological deficits. Extremities: Symmetric 5 x 5 power. Skin: No rashes, lesions or ulcers.  Psychiatry: Judgement and insight appear normal. Mood & affect appropriate.     Data Reviewed: I have personally reviewed following labs and imaging studies  CBC: Recent Labs  Lab 11/02/19 0617 11/03/19 0352 11/04/19 0428 11/05/19 0433 11/06/19 0439  WBC 18.6* 12.6* 13.7* 16.8* 14.0*  NEUTROABS 14.3*  --  11.1* 13.2* 9.8*  HGB 9.9* 8.4* 8.2* 8.0* 7.8*  HCT 31.3* 24.6* 23.5* 23.7* 22.5*  MCV 72.1* 69.5* 69.3* 69.1* 68.6*  PLT 435* 328 342 382 503   Basic Metabolic Panel: Recent Labs  Lab 11/02/19 0617 11/03/19 0352 11/04/19 0428 11/05/19 0433 11/06/19 0439  NA 132* 133* 138 134* 137  K 3.1* 3.0* 4.4 3.4* 3.6  CL 98 101 108 104 102  CO2 17* 23 24 24 25   GLUCOSE 106* 107* 100* 106* 101*  BUN 7 9 10  5* <5*  CREATININE 0.80 0.63 0.64 0.65 0.62  CALCIUM 8.7* 7.9* 8.3* 7.9* 8.0*  MG  --   --  1.8 1.5* 1.8   GFR: Estimated Creatinine Clearance: 92.6 mL/min (by C-G formula based on SCr of 0.62 mg/dL). Liver Function Tests: Recent Labs  Lab 11/02/19 0617 11/04/19 0428  AST 17 14*  ALT 13 13  ALKPHOS 72 59  BILITOT 1.7* 0.9  PROT 7.7 5.5*  ALBUMIN 2.6* 1.7*   No results for input(s): LIPASE, AMYLASE in the last 168 hours. No results for input(s): AMMONIA in the last 168 hours. Coagulation Profile: Recent Labs  Lab 11/04/19 0428  INR 1.4*   Cardiac Enzymes: No results for input(s): CKTOTAL, CKMB, CKMBINDEX, TROPONINI in the last 168 hours. BNP (last 3 results) No results for input(s): PROBNP in the last 8760 hours. HbA1C: No results for input(s): HGBA1C in the last 72 hours. CBG: No results for input(s): GLUCAP in the last 168 hours. Lipid Profile: No results for  input(s): CHOL, HDL, LDLCALC, TRIG, CHOLHDL, LDLDIRECT in the last 72 hours. Thyroid Function Tests: No results for input(s): TSH, T4TOTAL, FREET4, T3FREE, THYROIDAB in the last 72 hours. Anemia Panel: No results for input(s): VITAMINB12, FOLATE, FERRITIN, TIBC, IRON, RETICCTPCT in the last 72 hours. Sepsis Labs: Recent Labs  Lab 11/02/19 1412 11/02/19 1523 11/03/19 0352  LATICACIDVEN 2.2* 1.5 1.0    Recent Results (from the past 240 hour(s))  SARS Coronavirus 2 by RT PCR (hospital order, performed in [facility] hospital lab) Nasopharyngeal Nasopharyngeal Swab     Status: None   Collection Time: 11/02/19  3:59 PM   Specimen: Nasopharyngeal Swab  Result Value Ref Range Status   SARS Coronavirus 2 NEGATIVE NEGATIVE Final    Comment: (NOTE) SARS-CoV-2 target nucleic  acids are NOT DETECTED.  The SARS-CoV-2 RNA is generally detectable in upper and lower respiratory specimens during the acute phase of infection. The lowest concentration of SARS-CoV-2 viral copies this assay can detect is 250 copies / mL. A negative result does not preclude SARS-CoV-2 infection and should not be used as the sole basis for treatment or other patient management decisions.  A negative result may occur with improper specimen collection / handling, submission of specimen other than nasopharyngeal swab, presence of viral mutation(s) within the areas targeted by this assay, and inadequate number of viral copies (<250 copies / mL). A negative result must be combined with clinical observations, patient history, and epidemiological information.  Fact Sheet for Patients:   StrictlyIdeas.no  Fact Sheet for Healthcare Providers: BankingDealers.co.za  This test is not yet approved or  cleared by the [place] FDA and has been authorized for detection and/or diagnosis of SARS-CoV-2 by FDA under an Emergency Use Authorization (EUA).  This EUA will remain in  effect (meaning this test can be used) for the duration of the COVID-19 declaration under Section 564(b)(1) of the Act, 21 U.S.C. section 360bbb-3(b)(1), unless the authorization is terminated or revoked sooner.  Performed at [facility], [place] [address], [place], [place] [address]   Culture, blood (routine x 2)     Status: None (Preliminary result)   Collection Time: 11/03/19  3:52 AM   Specimen: BLOOD  Result Value Ref Range Status   Specimen Description BLOOD  Final   Special Requests   Final    LEFT CEPHELIC BOTTLES DRAWN AEROBIC AND ANAEROBIC Blood Culture adequate volume   Culture   Final    NO GROWTH 2 DAYS Performed at [facility], [place] [address] [place], [place] [address]    Report Status PENDING  Incomplete  Culture, blood (routine x 2)     Status: None (Preliminary result)   Collection Time: 11/03/19  4:02 AM   Specimen: BLOOD  Result Value Ref Range Status   Specimen Description BLOOD LEFT ANTECUBITAL  Final   Special Requests   Final    BOTTLES DRAWN AEROBIC AND ANAEROBIC Blood Culture results may not be optimal due to an excessive volume of blood received in culture bottles   Culture   Final    NO GROWTH 2 DAYS Performed at [facility], [place] [address] [place], [place] [address]    Report Status PENDING  Incomplete  Aerobic/Anaerobic Culture (surgical/deep wound)     Status: None (Preliminary result)   Collection Time: 11/04/19  9:53 AM   Specimen: Abscess  Result Value Ref Range Status   Specimen Description ABSCESS  Final   Special Requests ABDOMEN  Final   Gram Stain   Final    ABUNDANT WBC PRESENT, PREDOMINANTLY PMN ABUNDANT GRAM POSITIVE COCCI IN PAIRS MODERATE GRAM VARIABLE ROD    Culture   Final    MODERATE ESCHERICHIA COLI MODERATE PSEUDOMONAS AERUGINOSA SUSCEPTIBILITIES TO FOLLOW Performed at [facility], [place] [address], [place], [place] [address]    Report Status PENDING  Incomplete         Radiology  Studies: No results found.  Scheduled Meds: . acetaminophen  1,000 mg Oral Q8H  . enoxaparin (LOVENOX) injection  40 mg Subcutaneous Q24H  . feeding supplement (KATE FARMS STANDARD 1.4)  325 mL Oral BID BM  . sodium chloride flush  5 mL Intracatheter Q8H   Continuous Infusions: . sodium chloride 75 mL/hr at 11/05/19  2230  . piperacillin-tazobactam (ZOSYN)  IV 3.375 g (11/06/19 0542)    Darliss Cheney, MD [facility] 11/06/2019, 12:43 PM

## 2019-11-06 NOTE — Progress Notes (Signed)
Subjective: CC: Patient is doing well. Reports her pain is much better today. Only mild pain in her suprapubic abdomen yesterday and no real pain today. Tolerating cld without n/v. No flatus yesterday or this morning. Last BM 8/3. Mobilizing. Dysuria has resolved.    Objective: Vital signs in last 24 hours: Temp:  [97.9 F (36.6 C)-98.9 F (37.2 C)] 97.9 F (36.6 C) (08/05 0716) Pulse Rate:  [93-111] 111 (08/05 0729) Resp:  [17-18] 18 (08/05 0716) BP: (103-119)/(68-89) 119/89 (08/05 0716) SpO2:  [95 %-100 %] 100 % (08/05 0729) Weight:  [95.6 kg] 95.6 kg (08/05 0729) Last BM Date: 11/04/19  Intake/Output from previous day: 08/04 0701 - 08/05 0700 In: 496.1 [I.V.:430.5; IV Piggyback:65.7] Out: 115 [Drains:115] Intake/Output this shift: No intake/output data recorded.  PE: Gen: Awake and alert, NAD Heart:Tachycardic on monitor  Lungs: Normal rate and effort  Abd: Soft, mild distension,NT, +BS JP x 2supapubic region - both with purulent, light brown drainage Msk: No edema   Lab Results:  Recent Labs    11/05/19 0433 11/06/19 0439  WBC 16.8* 14.0*  HGB 8.0* 7.8*  HCT 23.7* 22.5*  PLT 382 389   BMET Recent Labs    11/05/19 0433 11/06/19 0439  NA 134* 137  K 3.4* 3.6  CL 104 102  CO2 24 25  GLUCOSE 106* 101*  BUN 5* <5*  CREATININE 0.65 0.62  CALCIUM 7.9* 8.0*   PT/INR Recent Labs    11/04/19 0428  LABPROT 16.4*  INR 1.4*   CMP     Component Value Date/Time   NA 137 11/06/2019 0439   K 3.6 11/06/2019 0439   CL 102 11/06/2019 0439   CO2 25 11/06/2019 0439   GLUCOSE 101 (H) 11/06/2019 0439   BUN <5 (L) 11/06/2019 0439   CREATININE 0.62 11/06/2019 0439   CALCIUM 8.0 (L) 11/06/2019 0439   PROT 5.5 (L) 11/04/2019 0428   ALBUMIN 1.7 (L) 11/04/2019 0428   AST 14 (L) 11/04/2019 0428   ALT 13 11/04/2019 0428   ALKPHOS 59 11/04/2019 0428   BILITOT 0.9 11/04/2019 0428   GFRNONAA >60 11/06/2019 0439   GFRAA >60 11/06/2019 0439    Lipase  No results found for: LIPASE     Studies/Results: CT IMAGE GUIDED DRAINAGE BY PERCUTANEOUS CATHETER  Result Date: 11/04/2019 INDICATION: 54 year old female with a history of pelvic abscess, trans spatial EXAM: CT GUIDED DRAINAGE OF  ABSCESS MEDICATIONS: The patient is currently admitted to the hospital and receiving intravenous antibiotics. The antibiotics were administered within an appropriate time frame prior to the initiation of the procedure. ANESTHESIA/SEDATION: 1.0 mg IV Versed 50 mcg IV Fentanyl Moderate Sedation Time:  21 minutes The patient was continuously monitored during the procedure by the interventional radiology nurse under my direct supervision. COMPLICATIONS: None TECHNIQUE: Informed written consent was obtained from the patient after a thorough discussion of the procedural risks, benefits and alternatives. All questions were addressed. Maximal Sterile Barrier Technique was utilized including caps, mask, sterile gowns, sterile gloves, sterile drape, hand hygiene and skin antiseptic. A timeout was performed prior to the initiation of the procedure. PROCEDURE: The operative field was prepped with Chlorhexidine in a sterile fashion, and a sterile drape was applied covering the operative field. A sterile gown and sterile gloves were used for the procedure. Local anesthesia was provided with 1% Lidocaine. Once the patient is prepped and draped in the usual sterile fashion, 1% lidocaine was used for local anesthesia. Two parallel 18 gauge  trocar needles were advanced with CT guidance into the deep abscess and the superficial component of the abscess. Using modified Seldinger technique, a 12 French drain was placed into the deep abscess, and separately into the superficial abscess. Approximately 60 cc of purulent material aspirated from the deep abscess with a culture sent. Both catheters were sutured in position and attached to bulb suction drainage. Final CT was acquired. Patient  tolerated the procedure well and remained hemodynamically stable throughout. No complications were encountered and no significant blood loss. FINDINGS: CT demonstrates a multi spatial abscess within the pelvis, similar to the comparison CT. After drainage placement, there is a 66 [place] drain from anterior midline approach to the deep component, and a tangential right of midline 12 French drain into the superficial component. Both are attached to bulb drainage. IMPRESSION: Status post CT-guided drainage with placement of 2 drainage catheters into a multi spatial abscess of the pelvis. Signed, Yvone Neu. Reyne Dumas, RPVI [facility] Radiology Electronically Signed   By: Gilmer Mor D.O.   On: 11/04/2019 10:03   CT IMAGE GUIDED DRAINAGE BY PERCUTANEOUS CATHETER  Result Date: 11/04/2019 INDICATION: 54 year old female with a history of pelvic abscess, trans spatial EXAM: CT GUIDED DRAINAGE OF  ABSCESS MEDICATIONS: The patient is currently admitted to the hospital and receiving intravenous antibiotics. The antibiotics were administered within an appropriate time frame prior to the initiation of the procedure. ANESTHESIA/SEDATION: 1.0 mg IV Versed 50 mcg IV Fentanyl Moderate Sedation Time:  21 minutes The patient was continuously monitored during the procedure by the interventional radiology nurse under my direct supervision. COMPLICATIONS: None TECHNIQUE: Informed written consent was obtained from the patient after a thorough discussion of the procedural risks, benefits and alternatives. All questions were addressed. Maximal Sterile Barrier Technique was utilized including caps, mask, sterile gowns, sterile gloves, sterile drape, hand hygiene and skin antiseptic. A timeout was performed prior to the initiation of the procedure. PROCEDURE: The operative field was prepped with Chlorhexidine in a sterile fashion, and a sterile drape was applied covering the operative  field. A sterile gown and sterile gloves were used for the procedure. Local anesthesia was provided with 1% Lidocaine. Once the patient is prepped and draped in the usual sterile fashion, 1% lidocaine was used for local anesthesia. Two parallel 18 gauge trocar needles were advanced with CT guidance into the deep abscess and the superficial component of the abscess. Using modified Seldinger technique, a 12 French drain was placed into the deep abscess, and separately into the superficial abscess. Approximately 60 cc of purulent material aspirated from the deep abscess with a culture sent. Both catheters were sutured in position and attached to bulb suction drainage. Final CT was acquired. Patient tolerated the procedure well and remained hemodynamically stable throughout. No complications were encountered and no significant blood loss. FINDINGS: CT demonstrates a multi spatial abscess within the pelvis, similar to the comparison CT. After drainage placement, there is a 49 [place] drain from anterior midline approach to the deep component, and a tangential right of midline 12 French drain into the superficial component. Both are attached to bulb drainage. IMPRESSION: Status post CT-guided drainage with placement of 2 drainage catheters into a multi spatial abscess of the pelvis. Signed, Yvone Neu. Reyne Dumas, RPVI [facility] Radiology Electronically Signed   By: Gilmer Mor D.O.   On: 11/04/2019 10:03    Anti-infectives: Anti-infectives (From admission, onward)   Start     Dose/Rate  Route Frequency Ordered Stop   11/03/19 0400  piperacillin-tazobactam (ZOSYN) IVPB 3.375 g     Discontinue     3.375 g 12.5 mL/hr over 240 Minutes Intravenous Every 8 hours 11/03/19 0240     11/02/19 2000  metroNIDAZOLE (FLAGYL) IVPB 500 mg  Status:  Discontinued        500 mg 100 mL/hr over 60 Minutes Intravenous Every 8 hours 11/02/19 1602 11/03/19 0223   11/02/19 1515   cefTRIAXone (ROCEPHIN) 1 g in sodium chloride 0.9 % 100 mL IVPB        1 g 200 mL/hr over 30 Minutes Intravenous  Once 11/02/19 1504 11/02/19 1643   11/02/19 1515  metroNIDAZOLE (FLAGYL) IVPB 500 mg        500 mg 100 mL/hr over 60 Minutes Intravenous  Once 11/02/19 1504 11/02/19 1753       Assessment/Plan Hx of HTN Hypokalemia - resolved  Anemia - hgb stable at 7.8 - Per TRH -   Diverticulitis with multiple abscess Possible devolping colovesical fistula - CT with multiple associated abscess collections extending inferior and anterior to the affected segment of the sigmoid colon. Tthe major components appear to be contiguous from just above the level of the bladder to the anterior abdominal wall - s/p IR perc drain x 2 11/04/19. Cx's growing gram neg rods, pseudomonas. Report pending.  - Adv to FLD - Hopefully will continue to be able to avoid surgical intervention during admission, as this would result in colostomy creation. Will need colonoscopy after resolution of this acute episode and outpatient follow up with colorectal surgery for sigmoid colectomy. - OOB mobilize!Pulm toilet   FEN - FLD VTE - SCDs, okay for chemical prophylaxis from a general surgery standpoint, please consider starting as hgb stable  ID - Rocephin/Flagyl x 1. Zosyn 8/1 >>WBC down at 14   LOS: 4 days    [name] , [facility] Surgery 11/06/2019, 8:05 AM Please see Amion for pager number during day hours 7:00am-4:30pm

## 2019-11-07 DIAGNOSIS — R6520 Severe sepsis without septic shock: Secondary | ICD-10-CM

## 2019-11-07 DIAGNOSIS — A419 Sepsis, unspecified organism: Principal | ICD-10-CM

## 2019-11-07 LAB — VITAMIN B12: Vitamin B-12: 1181 pg/mL — ABNORMAL HIGH (ref 180–914)

## 2019-11-07 LAB — CBC WITH DIFFERENTIAL/PLATELET
Abs Immature Granulocytes: 0.47 10*3/uL — ABNORMAL HIGH (ref 0.00–0.07)
Basophils Absolute: 0.1 10*3/uL (ref 0.0–0.1)
Basophils Relative: 1 %
Eosinophils Absolute: 0.3 10*3/uL (ref 0.0–0.5)
Eosinophils Relative: 3 %
HCT: 23 % — ABNORMAL LOW (ref 36.0–46.0)
Hemoglobin: 7.7 g/dL — ABNORMAL LOW (ref 12.0–15.0)
Immature Granulocytes: 4 %
Lymphocytes Relative: 22 %
Lymphs Abs: 2.4 10*3/uL (ref 0.7–4.0)
MCH: 23.1 pg — ABNORMAL LOW (ref 26.0–34.0)
MCHC: 33.5 g/dL (ref 30.0–36.0)
MCV: 68.9 fL — ABNORMAL LOW (ref 80.0–100.0)
Monocytes Absolute: 0.8 10*3/uL (ref 0.1–1.0)
Monocytes Relative: 8 %
Neutro Abs: 6.9 10*3/uL (ref 1.7–7.7)
Neutrophils Relative %: 62 %
Platelets: 409 10*3/uL — ABNORMAL HIGH (ref 150–400)
RBC: 3.34 MIL/uL — ABNORMAL LOW (ref 3.87–5.11)
RDW: 17.1 % — ABNORMAL HIGH (ref 11.5–15.5)
WBC: 10.9 10*3/uL — ABNORMAL HIGH (ref 4.0–10.5)
nRBC: 0 % (ref 0.0–0.2)

## 2019-11-07 LAB — FOLATE: Folate: 10.2 ng/mL (ref >5.9)

## 2019-11-07 LAB — MAGNESIUM: Magnesium: 1.9 mg/dL (ref 1.7–2.4)

## 2019-11-07 LAB — IRON AND TIBC
Iron: 57 ug/dL (ref 28–170)
Saturation Ratios: 43 % — ABNORMAL HIGH (ref 10.4–31.8)
TIBC: 132 ug/dL — ABNORMAL LOW (ref 250–450)
UIBC: 75 ug/dL

## 2019-11-07 LAB — FERRITIN: Ferritin: 529 ng/mL — ABNORMAL HIGH (ref 11–307)

## 2019-11-07 LAB — TRANSFERRIN: Transferrin: 94 mg/dL — ABNORMAL LOW (ref 192–382)

## 2019-11-07 NOTE — Progress Notes (Signed)
Referring Physician(s): Dr. Chipper Herb  Supervising Physician: Oley Balm  Patient Status:  [facility] - In-pt  Chief Complaint: Diverticulitis with multiple abscesses; s/p left and right pelvic JP drains by Dr. Loreta Ave 11/04/19.  Subjective: Patient in bed watching TV. Brother at the bedside. Patient states she is still feeling better every day and has zero abdominal pain. Her only complaint is that she has not had a bowel movement since she's been admitted.   HPI: Patient presented to the ED 11/02/19 with lower abdominal pain and swelling. CT imaging positive for diverticulitis and multiple abscess collections. Left and right pelvic drains placed 8/3 by Interventional Radiology.   Allergies: Patient has no known allergies.  Medications: Prior to Admission medications   Medication Sig Start Date End Date Taking? Authorizing Provider  ALPRAZolam Prudy Feeler) 0.5 MG tablet Take 0.5 mg by mouth 3 (three) times daily as needed for anxiety.   Yes [provider]  hyoscyamine (LEVSIN) 0.125 MG tablet Take 0.125 mg by mouth every 6 (six) hours as needed for bladder spasms or cramping.  10/21/19  Yes [provider]  losartan-hydrochlorothiazide (HYZAAR) 50-12.5 MG per tablet Take 1 tablet by mouth daily.   Yes [provider]  promethazine (PHENERGAN) 25 MG tablet Take 25 mg by mouth 2 (two) times daily as needed for nausea or vomiting.  10/21/19  Yes [provider]  ciprofloxacin (CIPRO) 500 MG tablet Take 1 tablet (500 mg total) by mouth 2 (two) times daily. One po bid x 7 days Patient not taking: Reported on 11/02/2019 10/05/12   Doug Sou, MD  metroNIDAZOLE (FLAGYL) 500 MG tablet Take 1 tablet (500 mg total) by mouth 3 (three) times daily. Patient not taking: Reported on 11/02/2019 10/05/12   Doug Sou, MD  ondansetron (ZOFRAN) 8 MG tablet Take 1 tablet (8 mg total) by mouth every 8 (eight) hours as needed for nausea. Patient not taking: Reported on 11/02/2019  10/05/12   Doug Sou, MD     Vital Signs: BP 127/87 (BP Location: Left Arm)   Pulse 96   Temp 97.8 F (36.6 C) (Oral)   Resp 16   Ht 5\' 6"  (1.676 m)   Wt 210 lb 12.2 oz (95.6 kg)   SpO2 100%   BMI 34.02 kg/m   Physical Exam Constitutional:      General: She is not in acute distress. Cardiovascular:     Rate and Rhythm: Normal rate and regular rhythm.  Pulmonary:     Effort: Pulmonary effort is normal.  Abdominal:     Palpations: Abdomen is soft.     Tenderness: There is no abdominal tenderness.     Comments: Left and right lower abdominal drains in place. Sites are clean and dry. Right drain with 15-20 cc serosanguineous fluid in bulb. Left drain with 15-20 cc purulent fluid in bulb.   Neurological:     Mental Status: She is alert.     Imaging: CT IMAGE GUIDED DRAINAGE BY PERCUTANEOUS CATHETER  Result Date: 11/04/2019 INDICATION: 54 year old female with a history of pelvic abscess, trans spatial EXAM: CT GUIDED DRAINAGE OF  ABSCESS MEDICATIONS: The patient is currently admitted to the hospital and receiving intravenous antibiotics. The antibiotics were administered within an appropriate time frame prior to the initiation of the procedure. ANESTHESIA/SEDATION: 1.0 mg IV Versed 50 mcg IV Fentanyl Moderate Sedation Time:  21 minutes The patient was continuously monitored during the procedure by the interventional radiology nurse under my direct supervision. COMPLICATIONS: None TECHNIQUE: Informed written  consent was obtained from the patient after a thorough discussion of the procedural risks, benefits and alternatives. All questions were addressed. Maximal Sterile Barrier Technique was utilized including caps, mask, sterile gowns, sterile gloves, sterile drape, hand hygiene and skin antiseptic. A timeout was performed prior to the initiation of the procedure. PROCEDURE: The operative field was prepped with Chlorhexidine in a sterile fashion, and a sterile drape was applied  covering the operative field. A sterile gown and sterile gloves were used for the procedure. Local anesthesia was provided with 1% Lidocaine. Once the patient is prepped and draped in the usual sterile fashion, 1% lidocaine was used for local anesthesia. Two parallel 18 gauge trocar needles were advanced with CT guidance into the deep abscess and the superficial component of the abscess. Using modified Seldinger technique, a 12 French drain was placed into the deep abscess, and separately into the superficial abscess. Approximately 60 cc of purulent material aspirated from the deep abscess with a culture sent. Both catheters were sutured in position and attached to bulb suction drainage. Final CT was acquired. Patient tolerated the procedure well and remained hemodynamically stable throughout. No complications were encountered and no significant blood loss. FINDINGS: CT demonstrates a multi spatial abscess within the pelvis, similar to the comparison CT. After drainage placement, there is a 31 [place] drain from anterior midline approach to the deep component, and a tangential right of midline 12 French drain into the superficial component. Both are attached to bulb drainage. IMPRESSION: Status post CT-guided drainage with placement of 2 drainage catheters into a multi spatial abscess of the pelvis. Signed, Yvone Neu. Reyne Dumas, RPVI [facility] Radiology Electronically Signed   By: Gilmer Mor D.O.   On: 11/04/2019 10:03   CT IMAGE GUIDED DRAINAGE BY PERCUTANEOUS CATHETER  Result Date: 11/04/2019 INDICATION: 54 year old female with a history of pelvic abscess, trans spatial EXAM: CT GUIDED DRAINAGE OF  ABSCESS MEDICATIONS: The patient is currently admitted to the hospital and receiving intravenous antibiotics. The antibiotics were administered within an appropriate time frame prior to the initiation of the procedure. ANESTHESIA/SEDATION: 1.0 mg IV Versed 50 mcg IV  Fentanyl Moderate Sedation Time:  21 minutes The patient was continuously monitored during the procedure by the interventional radiology nurse under my direct supervision. COMPLICATIONS: None TECHNIQUE: Informed written consent was obtained from the patient after a thorough discussion of the procedural risks, benefits and alternatives. All questions were addressed. Maximal Sterile Barrier Technique was utilized including caps, mask, sterile gowns, sterile gloves, sterile drape, hand hygiene and skin antiseptic. A timeout was performed prior to the initiation of the procedure. PROCEDURE: The operative field was prepped with Chlorhexidine in a sterile fashion, and a sterile drape was applied covering the operative field. A sterile gown and sterile gloves were used for the procedure. Local anesthesia was provided with 1% Lidocaine. Once the patient is prepped and draped in the usual sterile fashion, 1% lidocaine was used for local anesthesia. Two parallel 18 gauge trocar needles were advanced with CT guidance into the deep abscess and the superficial component of the abscess. Using modified Seldinger technique, a 12 French drain was placed into the deep abscess, and separately into the superficial abscess. Approximately 60 cc of purulent material aspirated from the deep abscess with a culture sent. Both catheters were sutured in position and attached to bulb suction drainage. Final CT was acquired. Patient tolerated the procedure well and remained hemodynamically stable throughout. No complications were encountered and no significant  blood loss. FINDINGS: CT demonstrates a multi spatial abscess within the pelvis, similar to the comparison CT. After drainage placement, there is a 22 [place] drain from anterior midline approach to the deep component, and a tangential right of midline 12 French drain into the superficial component. Both are attached to bulb drainage. IMPRESSION: Status post CT-guided drainage with  placement of 2 drainage catheters into a multi spatial abscess of the pelvis. Signed, Yvone Neu. Reyne Dumas, RPVI [facility] Electronically Signed   By: Gilmer Mor D.O.   On: 11/04/2019 10:03    Labs:  CBC: Recent Labs    11/04/19 0428 11/05/19 0433 11/06/19 0439 11/07/19 0527  WBC 13.7* 16.8* 14.0* 10.9*  HGB 8.2* 8.0* 7.8* 7.7*  HCT 23.5* 23.7* 22.5* 23.0*  PLT 342 382 389 409*    COAGS: Recent Labs    11/04/19 0428  INR 1.4*    BMP: Recent Labs    11/03/19 0352 11/04/19 0428 11/05/19 0433 11/06/19 0439  NA 133* 138 134* 137  K 3.0* 4.4 3.4* 3.6  CL 101 108 104 102  CO2 23 24 24 25   GLUCOSE 107* 100* 106* 101*  BUN 9 10 5* <5*  CALCIUM 7.9* 8.3* 7.9* 8.0*  CREATININE 0.63 0.64 0.65 0.62  GFRNONAA >60 >60 >60 >60  GFRAA >60 >60 >60 >60    LIVER FUNCTION TESTS: Recent Labs    11/02/19 0617 11/04/19 0428  BILITOT 1.7* 0.9  AST 17 14*  ALT 13 13  ALKPHOS 72 59  PROT 7.7 5.5*  ALBUMIN 2.6* 1.7*    Assessment and Plan:  Diverticulitis with multiple abdominal abscesses: Left and right JP drains are draining well. 63 cc output document for the right, 78 cc documented from the left. Patient has no pain. WBC count 10.9, patient is afebrile. She is currently on a full liquid diet.   Continue current drain orders - flush with 5 cc NS every shift, document output, change dressing daily or as needed.   Unsure of patient discharge date but I have placed orders for the patient to be seen at the [facility] Radiology clinic 10-14 days after she is discharged. Drain care instructions have also been placed in the AVS. Bedside teaching on drain care, flushing, documenting output, and keeping the site clean and dry has been done.   IR will continue to follow. General Surgery also following.   Electronically Signed: Alwyn Ren, AGACNP-BC [phone] 11/07/2019, 2:36 PM   I spent a total of 15  Minutes at the the patient's bedside AND on the patient's hospital floor or unit, greater than 50% of which was counseling/coordinating care for abdominal abscess drains.

## 2019-11-07 NOTE — Progress Notes (Signed)
Patient ID: [name], female   DOB: 1965/05/27, 54 y.o.   MRN: [id number]  PROGRESS NOTE    [name]  QMG:[id number] DOB: 06-20-1965 DOA: 11/02/2019 PCP: Kristen Loader, FNP   Brief Narrative:  .-year-old female with history of hypertension, recurrent diverticulitis presented with abdominal pain.  On presentation, WBCs 18.6.  CT abdomen with contrast showed acute diverticulitis with multiple abscesses.  She was started on antibiotics.  General surgery was consulted.  Assessment & Plan:   Severe sepsis secondary to acute recurrent diverticulitis with multiple intra-abdominal abscesses, present on admission Developing colovesical fistula: Patient met severe sepsis at the time of admission based on tachycardia, leukocytosis and lactic acid of 2.2. -CT abdomen on presentation showed acute diverticulitis with multiple abscesses with concern for developing colovesical fistula -General surgery has been consulted and recommend IR for possible drain placement.   -Status post percutaneous drain placement by IR on 11/04/2019.  Abdominal abscess culture is growing E. coli, Pseudomonas and Streptococcus.  Blood culture remain negative.  Patient has no abdominal pain or tenderness today.  Patient was seen by general surgery.  Advanced diet to full liquid diet however patient did not try that and continue to have clear liquid diet.  Continue Zosyn and tailor antibiotics accordingly.  Leukocytosis improving.  She remains afebrile.   Hyponatremia: Resolved.  Continue to hold HCTZ while her blood pressure is within normal range.  Hypokalemia: Resolved.  Hypertension: Controlled. -HCTZ and ACE inhibitor held.  Continue as needed hydralazine.  Microcytic anemia: Presented with a hemoglobin of 9.9 with low MCV.  Now down to 7.7.  Will check iron studies, FOBT, folate and B12.  Monitor daily.  Transfuse if less than 7.  DVT prophylaxis: Lovenox Code Status: Full Family Communication: No family present  at bedside.  Patient competent and alert. Disposition Plan: Status is: Inpatient  Remains inpatient appropriate because:Inpatient level of care appropriate due to severity of illness   Dispo: The patient is from: Home              Anticipated d/c is to: Home              Anticipated d/c date is: > 3 days              Patient currently is not medically stable to d/c.   Consultants: General surgery/IR  Procedures: Intra-abdominal percutaneous drain placed  Antimicrobials:  Anti-infectives (From admission, onward)   Start     Dose/Rate Route Frequency Ordered Stop   11/03/19 0400  piperacillin-tazobactam (ZOSYN) IVPB 3.375 g     Discontinue     3.375 g 12.5 mL/hr over 240 Minutes Intravenous Every 8 hours 11/03/19 0240     11/02/19 2000  metroNIDAZOLE (FLAGYL) IVPB 500 mg  Status:  Discontinued        500 mg 100 mL/hr over 60 Minutes Intravenous Every 8 hours 11/02/19 1602 11/03/19 0223   11/02/19 1515  cefTRIAXone (ROCEPHIN) 1 g in sodium chloride 0.9 % 100 mL IVPB        1 g 200 mL/hr over 30 Minutes Intravenous  Once 11/02/19 1504 11/02/19 1643   11/02/19 1515  metroNIDAZOLE (FLAGYL) IVPB 500 mg        500 mg 100 mL/hr over 60 Minutes Intravenous  Once 11/02/19 1504 11/02/19 1753       Subjective: Seen and examined.  Complains of mild lower abdominal pain.  No other complaint.  No nausea.  Had bowel movement 2 days ago.  Objective:  Vitals:   11/06/19 1905 11/06/19 2045 11/07/19 0500 11/07/19 0822  BP:  119/73 112/80 108/71  Pulse: (!) 103 96 87 94  Resp:  18 18 18   Temp:  98.5 F (36.9 C) 97.6 F (36.4 C) 97.9 F (36.6 C)  TempSrc:  Oral Oral Oral  SpO2: 100% 98% 96% 100%  Weight:      Height:        Intake/Output Summary (Last 24 hours) at 11/07/2019 1145 Last data filed at 11/07/2019 0607 Gross per 24 hour  Intake 1980.44 ml  Output 141 ml  Net 1839.44 ml   Filed Weights   11/04/19 0244 11/05/19 0357 11/06/19 0729  Weight: 93.4 kg 95.2 kg 95.6 kg     Examination:  General exam: Appears calm and comfortable  Respiratory system: Clear to auscultation. Respiratory effort normal. Cardiovascular system: S1 & S2 heard, RRR. No JVD, murmurs, rubs, gallops or clicks. No pedal edema. Gastrointestinal system: Abdomen is nondistended, soft and nontender. No organomegaly or masses felt. Normal bowel sounds heard.  2 JP drains with serous fluid. Central nervous system: Alert and oriented. No focal neurological deficits. Extremities: Symmetric 5 x 5 power. Skin: No rashes, lesions or ulcers.  Psychiatry: Judgement and insight appear normal. Mood & affect appropriate.   Data Reviewed: I have personally reviewed following labs and imaging studies  CBC: Recent Labs  Lab 11/02/19 0617 11/02/19 0617 11/03/19 0352 11/04/19 0428 11/05/19 0433 11/06/19 0439 11/07/19 0527  WBC 18.6*   < > 12.6* 13.7* 16.8* 14.0* 10.9*  NEUTROABS 14.3*  --   --  11.1* 13.2* 9.8* 6.9  HGB 9.9*   < > 8.4* 8.2* 8.0* 7.8* 7.7*  HCT 31.3*   < > 24.6* 23.5* 23.7* 22.5* 23.0*  MCV 72.1*   < > 69.5* 69.3* 69.1* 68.6* 68.9*  PLT 435*   < > 328 342 382 389 409*   < > = values in this interval not displayed.   Basic Metabolic Panel: Recent Labs  Lab 11/02/19 0617 11/03/19 0352 11/04/19 0428 11/05/19 0433 11/06/19 0439 11/07/19 0527  NA 132* 133* 138 134* 137  --   K 3.1* 3.0* 4.4 3.4* 3.6  --   CL 98 101 108 104 102  --   CO2 17* 23 24 24 25   --   GLUCOSE 106* 107* 100* 106* 101*  --   BUN 7 9 10  5* <5*  --   CREATININE 0.80 0.63 0.64 0.65 0.62  --   CALCIUM 8.7* 7.9* 8.3* 7.9* 8.0*  --   MG  --   --  1.8 1.5* 1.8 1.9   GFR: Estimated Creatinine Clearance: 92.6 mL/min (by C-G formula based on SCr of 0.62 mg/dL). Liver Function Tests: Recent Labs  Lab 11/02/19 0617 11/04/19 0428  AST 17 14*  ALT 13 13  ALKPHOS 72 59  BILITOT 1.7* 0.9  PROT 7.7 5.5*  ALBUMIN 2.6* 1.7*   No results for input(s): LIPASE, AMYLASE in the last 168 hours. No results  for input(s): AMMONIA in the last 168 hours. Coagulation Profile: Recent Labs  Lab 11/04/19 0428  INR 1.4*   Cardiac Enzymes: No results for input(s): CKTOTAL, CKMB, CKMBINDEX, TROPONINI in the last 168 hours. BNP (last 3 results) No results for input(s): PROBNP in the last 8760 hours. HbA1C: No results for input(s): HGBA1C in the last 72 hours. CBG: No results for input(s): GLUCAP in the last 168 hours. Lipid Profile: No results for input(s): CHOL, HDL, LDLCALC, TRIG, CHOLHDL, LDLDIRECT in  the last 72 hours. Thyroid Function Tests: No results for input(s): TSH, T4TOTAL, FREET4, T3FREE, THYROIDAB in the last 72 hours. Anemia Panel: No results for input(s): VITAMINB12, FOLATE, FERRITIN, TIBC, IRON, RETICCTPCT in the last 72 hours. Sepsis Labs: Recent Labs  Lab 11/02/19 1412 11/02/19 1523 11/03/19 0352  LATICACIDVEN 2.2* 1.5 1.0    Recent Results (from the past 240 hour(s))  SARS Coronavirus 2 by RT PCR (hospital order, performed in [facility] hospital lab) Nasopharyngeal Nasopharyngeal Swab     Status: None   Collection Time: 11/02/19  3:59 PM   Specimen: Nasopharyngeal Swab  Result Value Ref Range Status   SARS Coronavirus 2 NEGATIVE NEGATIVE Final    Comment: (NOTE) SARS-CoV-2 target nucleic acids are NOT DETECTED.  The SARS-CoV-2 RNA is generally detectable in upper and lower respiratory specimens during the acute phase of infection. The lowest concentration of SARS-CoV-2 viral copies this assay can detect is 250 copies / mL. A negative result does not preclude SARS-CoV-2 infection and should not be used as the sole basis for treatment or other patient management decisions.  A negative result may occur with improper specimen collection / handling, submission of specimen other than nasopharyngeal swab, presence of viral mutation(s) within the areas targeted by this assay, and inadequate number of viral copies (<250 copies / mL). A negative result must be combined  with clinical observations, patient history, and epidemiological information.  Fact Sheet for Patients:   StrictlyIdeas.no  Fact Sheet for Healthcare Providers: BankingDealers.co.za  This test is not yet approved or  cleared by the [place] FDA and has been authorized for detection and/or diagnosis of SARS-CoV-2 by FDA under an Emergency Use Authorization (EUA).  This EUA will remain in effect (meaning this test can be used) for the duration of the COVID-19 declaration under Section 564(b)(1) of the Act, 21 U.S.C. section 360bbb-3(b)(1), unless the authorization is terminated or revoked sooner.  Performed at [facility], [place] [address], [place], [place] [address]   Culture, blood (routine x 2)     Status: None (Preliminary result)   Collection Time: 11/03/19  3:52 AM   Specimen: BLOOD  Result Value Ref Range Status   Specimen Description BLOOD  Final   Special Requests   Final    LEFT CEPHELIC BOTTLES DRAWN AEROBIC AND ANAEROBIC Blood Culture adequate volume   Culture   Final    NO GROWTH 3 DAYS Performed at [facility], [place] [address], [place], [place] [address]    Report Status PENDING  Incomplete  Culture, blood (routine x 2)     Status: None (Preliminary result)   Collection Time: 11/03/19  4:02 AM   Specimen: BLOOD  Result Value Ref Range Status   Specimen Description BLOOD LEFT ANTECUBITAL  Final   Special Requests   Final    BOTTLES DRAWN AEROBIC AND ANAEROBIC Blood Culture results may not be optimal due to an excessive volume of blood received in culture bottles   Culture   Final    NO GROWTH 3 DAYS Performed at [facility], [place] [address] [place], [place] [address]    Report Status PENDING  Incomplete  Aerobic/Anaerobic Culture (surgical/deep wound)     Status: None (Preliminary result)   Collection Time: 11/04/19  9:53 AM   Specimen: Abscess  Result Value Ref Range Status   Specimen  Description ABSCESS  Final   Special Requests ABDOMEN  Final   Gram Stain   Final  ABUNDANT WBC PRESENT, PREDOMINANTLY PMN ABUNDANT GRAM POSITIVE COCCI IN PAIRS MODERATE GRAM VARIABLE ROD Performed at [facility], [place] [address], [place], [place] [address]    Culture   Final    MODERATE ESCHERICHIA COLI MODERATE PSEUDOMONAS AERUGINOSA ABUNDANT STREPTOCOCCUS GROUP C SUSCEPTIBILITIES TO FOLLOW MIXED ANAEROBIC FLORA PRESENT.  CALL LAB IF FURTHER IID REQUIRED.    Report Status PENDING  Incomplete         Radiology Studies: No results found.  Scheduled Meds: . acetaminophen  1,000 mg Oral Q8H  . enoxaparin (LOVENOX) injection  40 mg Subcutaneous Q24H  . feeding supplement ([employer] STANDARD 1.4)  325 mL Oral BID BM  . sodium chloride flush  5 mL Intracatheter Q8H   Continuous Infusions: . sodium chloride 75 mL/hr at 11/07/19 0603  . piperacillin-tazobactam (ZOSYN)  IV 3.375 g (11/07/19 0603)    Darliss Cheney, MD [facility] 11/07/2019, 11:45 AM

## 2019-11-07 NOTE — Progress Notes (Signed)
Subjective: CC: Patient reports mild pain of her suprapubic abdomen. More soreness than pain. No appetite yesterday. Did not try FLD. Just had ginger ale, icy and jello. Tolerated without n/v. No flatus or BM yesterday or this AM. She reports she may have had one on Wed after discussing with her brother but did not recall it yesterday. Mobilizing. Dysuria has resolved.   Objective: Vital signs in last 24 hours: Temp:  [97.6 F (36.4 C)-98.5 F (36.9 C)] 97.9 F (36.6 C) (08/06 0822) Pulse Rate:  [87-103] 94 (08/06 0822) Resp:  [16-18] 18 (08/06 0822) BP: (108-119)/(70-80) 108/71 (08/06 0822) SpO2:  [96 %-100 %] 100 % (08/06 0822) Last BM Date: 11/04/19  Intake/Output from previous day: 08/05 0701 - 08/06 0700 In: 2220.4 [P.O.:480; I.V.:1430.3; IV Piggyback:235.1] Out: 141 [Drains:141] Intake/Output this shift: No intake/output data recorded.  PE: Gen: Awake and alert, NAD Heart:Tachycardic on monitor Lungs: Normal rate and effort  Abd: Soft, mild distension,NT, +BS JP x 2supapubic region - R drain SS. L drain purulent, light brown drainage Msk: 1+ non-pitting edema  Lab Results:  Recent Labs    11/06/19 0439 11/07/19 0527  WBC 14.0* 10.9*  HGB 7.8* 7.7*  HCT 22.5* 23.0*  PLT 389 409*   BMET Recent Labs    11/05/19 0433 11/06/19 0439  NA 134* 137  K 3.4* 3.6  CL 104 102  CO2 24 25  GLUCOSE 106* 101*  BUN 5* <5*  CREATININE 0.65 0.62  CALCIUM 7.9* 8.0*   PT/INR No results for input(s): LABPROT, INR in the last 72 hours. CMP     Component Value Date/Time   NA 137 11/06/2019 0439   K 3.6 11/06/2019 0439   CL 102 11/06/2019 0439   CO2 25 11/06/2019 0439   GLUCOSE 101 (H) 11/06/2019 0439   BUN <5 (L) 11/06/2019 0439   CREATININE 0.62 11/06/2019 0439   CALCIUM 8.0 (L) 11/06/2019 0439   PROT 5.5 (L) 11/04/2019 0428   ALBUMIN 1.7 (L) 11/04/2019 0428   AST 14 (L) 11/04/2019 0428   ALT 13 11/04/2019 0428   ALKPHOS 59 11/04/2019  0428   BILITOT 0.9 11/04/2019 0428   GFRNONAA >60 11/06/2019 0439   GFRAA >60 11/06/2019 0439   Lipase  No results found for: LIPASE     Studies/Results: No results found.  Anti-infectives: Anti-infectives (From admission, onward)   Start     Dose/Rate Route Frequency Ordered Stop   11/03/19 0400  piperacillin-tazobactam (ZOSYN) IVPB 3.375 g     Discontinue     3.375 g 12.5 mL/hr over 240 Minutes Intravenous Every 8 hours 11/03/19 0240     11/02/19 2000  metroNIDAZOLE (FLAGYL) IVPB 500 mg  Status:  Discontinued        500 mg 100 mL/hr over 60 Minutes Intravenous Every 8 hours 11/02/19 1602 11/03/19 0223   11/02/19 1515  cefTRIAXone (ROCEPHIN) 1 g in sodium chloride 0.9 % 100 mL IVPB        1 g 200 mL/hr over 30 Minutes Intravenous  Once 11/02/19 1504 11/02/19 1643   11/02/19 1515  metroNIDAZOLE (FLAGYL) IVPB 500 mg        500 mg 100 mL/hr over 60 Minutes Intravenous  Once 11/02/19 1504 11/02/19 1753       Assessment/Plan Hx of HTN Hypokalemia- resolved Anemia - hgb stable at 7.7 - Per TRH -   Diverticulitis with multiple abscess Possible devolping colovesical fistula - CT with multiple associated abscess collections extending  inferior and anterior to the affected segment of the sigmoid colon. Tthe major components appear to be contiguous from just above the level of the bladder to the anterior abdominal wall - s/p IR perc drain x 2 11/04/19. Cx's growing E. Coli, Strep C, pseudomonas. Report pending.  - Maintain FLD - Hopefully will continue to be able to avoid surgical intervention during admission, as this would result in colostomy creation. Will need colonoscopy after resolution of this acute episode and outpatient follow up with colorectal surgery for sigmoid colectomy. - OOB mobilize!Pulm toilet   FEN - FLD VTE - SCDs, Lovenox  ID - Rocephin/Flagyl x 1. Zosyn 8/1 >>WBC down at 10.9   LOS: 5 days    [name] , [facility]  Surgery 11/07/2019, 8:52 AM Please see Amion for pager number during day hours 7:00am-4:30pm

## 2019-11-08 LAB — CULTURE, BLOOD (ROUTINE X 2)
Culture: NO GROWTH
Culture: NO GROWTH

## 2019-11-08 LAB — CBC WITH DIFFERENTIAL/PLATELET
Abs Immature Granulocytes: 0.53 10*3/uL — ABNORMAL HIGH (ref 0.00–0.07)
Basophils Absolute: 0.1 10*3/uL (ref 0.0–0.1)
Basophils Relative: 1 %
Eosinophils Absolute: 0.3 10*3/uL (ref 0.0–0.5)
Eosinophils Relative: 2 %
HCT: 24.6 % — ABNORMAL LOW (ref 36.0–46.0)
Hemoglobin: 8.1 g/dL — ABNORMAL LOW (ref 12.0–15.0)
Immature Granulocytes: 5 %
Lymphocytes Relative: 27 %
Lymphs Abs: 2.9 10*3/uL (ref 0.7–4.0)
MCH: 23.1 pg — ABNORMAL LOW (ref 26.0–34.0)
MCHC: 32.9 g/dL (ref 30.0–36.0)
MCV: 70.1 fL — ABNORMAL LOW (ref 80.0–100.0)
Monocytes Absolute: 1 10*3/uL (ref 0.1–1.0)
Monocytes Relative: 9 %
Neutro Abs: 6 10*3/uL (ref 1.7–7.7)
Neutrophils Relative %: 56 %
Platelets: 463 10*3/uL — ABNORMAL HIGH (ref 150–400)
RBC: 3.51 MIL/uL — ABNORMAL LOW (ref 3.87–5.11)
RDW: 17 % — ABNORMAL HIGH (ref 11.5–15.5)
WBC: 10.8 10*3/uL — ABNORMAL HIGH (ref 4.0–10.5)
nRBC: 0.2 % (ref 0.0–0.2)

## 2019-11-08 LAB — MAGNESIUM: Magnesium: 1.8 mg/dL (ref 1.7–2.4)

## 2019-11-08 NOTE — Progress Notes (Signed)
Patient ID: [name], female   DOB: 07-31-1965, 54 y.o.   MRN: [id number]  PROGRESS NOTE    [name]  GQQ:[id number] DOB: 09-Nov-1965 DOA: 11/02/2019 PCP: Kristen Loader, FNP   Brief Narrative:  [name] is a 54 y.o. female with medical history significant of hypertension, recurrent diverticulitis, presented with lower abdominal pain and lump.  Patient has had multiple episodes of diverticulitis in the past 2 to 3 years and has been managed by her PCP with p.o. antibiotics. Most recent episode with 3 weeks ago, when she was treated with Cipro and Flagyl for about 2 weeks. Her symptoms was improved for about one week then, over past 2-3 days, she started to have cramping like lower abdominal pain, and she started to feel a lump growing in size in the last 2 days with tenderness.  Upon arrival to ED, WBC 18.6, lactic acid 2.2, CT abdominal with contrast: Marked thickening of the walls of the sigmoid colon indicating acute diverticulitis. Multiple abscess collections extending inferior and anterior to the affected segment of the sigmoid colon.  She was admitted under hospitalist service, started on antibiotics and general surgery consulted. She was also diagnosed with severe sepsis based on tachycardia, leukocytosis and lactic acid of 2.2.  She underwent percutaneous drain placement by IR on 11/04/2019.  Abdominal abscess culture is growing E. coli, Pseudomonas and Streptococcus.  Blood culture remain negative.  She remains on Zosyn.  Assessment & Plan:   Severe sepsis secondary to acute recurrent diverticulitis with multiple intra-abdominal abscesses, present on admission Developing colovesical fistula: Patient met severe sepsis at the time of admission based on tachycardia, leukocytosis and lactic acid of 2.2. -CT abdomen on presentation showed acute diverticulitis with multiple abscesses with concern for developing colovesical fistula -General surgery has been consulted and recommend IR  for possible drain placement.   -Status post percutaneous drain placement by IR on 11/04/2019.  Abdominal abscess culture is growing E. coli, Pseudomonas and Streptococcus.  Blood culture remain negative.  Patient has no abdominal pain or tenderness today.  Patient was seen by general surgery.  They have decided to continue her on full liquid diet.  Continue Zosyn and tailor antibiotics accordingly. She remains afebrile.  Leukocytosis stable.  Hyponatremia: Resolved.  Continue to hold HCTZ while her blood pressure is within normal range.  Hypokalemia: Resolved.  Hypertension: Controlled. -HCTZ and ACE inhibitor held.  Continue as needed hydralazine.  Microcytic anemia: Presented with a hemoglobin of 9.9 with low MCV.  Now down to 7.7.  Will check iron studies, FOBT, folate and B12.  Monitor daily.  Transfuse if less than 7.  DVT prophylaxis: Lovenox Code Status: Full Family Communication: No family present at bedside.  Patient competent and alert. Disposition Plan: Status is: Inpatient  Remains inpatient appropriate because:Inpatient level of care appropriate due to severity of illness   Dispo: The patient is from: Home              Anticipated d/c is to: Home              Anticipated d/c date is: > 3 days              Patient currently is not medically stable to d/c.   Consultants: General surgery/IR  Procedures: Intra-abdominal percutaneous drain placed  Antimicrobials:  Anti-infectives (From admission, onward)   Start     Dose/Rate Route Frequency Ordered Stop   11/03/19 0400  piperacillin-tazobactam (ZOSYN) IVPB 3.375 g     Discontinue  3.375 g 12.5 mL/hr over 240 Minutes Intravenous Every 8 hours 11/03/19 0240     11/02/19 2000  metroNIDAZOLE (FLAGYL) IVPB 500 mg  Status:  Discontinued        500 mg 100 mL/hr over 60 Minutes Intravenous Every 8 hours 11/02/19 1602 11/03/19 0223   11/02/19 1515  cefTRIAXone (ROCEPHIN) 1 g in sodium chloride 0.9 % 100 mL IVPB        1  g 200 mL/hr over 30 Minutes Intravenous  Once 11/02/19 1504 11/02/19 1643   11/02/19 1515  metroNIDAZOLE (FLAGYL) IVPB 500 mg        500 mg 100 mL/hr over 60 Minutes Intravenous  Once 11/02/19 1504 11/02/19 1753       Subjective: Seen and examined.  Complains of mild abdominal pain but no worse than yesterday.  Has not had any bowel movement last 3 days.  Passing gas.  No other complaint.  Objective: Vitals:   11/07/19 2209 11/08/19 0113 11/08/19 0253 11/08/19 0915  BP: (!) 138/92 123/83 125/88 114/89  Pulse: (!) 116 85 97 95  Resp: 18 18 20 18   Temp: 97.9 F (36.6 C) 98.5 F (36.9 C) 98.3 F (36.8 C) 97.9 F (36.6 C)  TempSrc: Oral Oral Oral Oral  SpO2: 98% 97% 100% 100%  Weight:      Height:        Intake/Output Summary (Last 24 hours) at 11/08/2019 1327 Last data filed at 11/08/2019 6837 Gross per 24 hour  Intake 1256.23 ml  Output 160 ml  Net 1096.23 ml   Filed Weights   11/04/19 0244 11/05/19 0357 11/06/19 0729  Weight: 93.4 kg 95.2 kg 95.6 kg    Examination:  General exam: Appears calm and comfortable  Respiratory system: Clear to auscultation. Respiratory effort normal. Cardiovascular system: S1 & S2 heard, RRR. No JVD, murmurs, rubs, gallops or clicks. No pedal edema. Gastrointestinal system: Abdomen is nondistended, soft and nontender. No organomegaly or masses felt. Normal bowel sounds heard.  2 percutaneous drains with serous fluid. Central nervous system: Alert and oriented. No focal neurological deficits. Extremities: Symmetric 5 x 5 power. Skin: No rashes, lesions or ulcers.  Psychiatry: Judgement and insight appear normal. Mood & affect appropriate.    Data Reviewed: I have personally reviewed following labs and imaging studies  CBC: Recent Labs  Lab 11/04/19 0428 11/05/19 0433 11/06/19 0439 11/07/19 0527 11/08/19 0028  WBC 13.7* 16.8* 14.0* 10.9* 10.8*  NEUTROABS 11.1* 13.2* 9.8* 6.9 6.0  HGB 8.2* 8.0* 7.8* 7.7* 8.1*  HCT 23.5* 23.7* 22.5*  23.0* 24.6*  MCV 69.3* 69.1* 68.6* 68.9* 70.1*  PLT 342 382 389 409* 290*   Basic Metabolic Panel: Recent Labs  Lab 11/02/19 0617 11/03/19 0352 11/04/19 0428 11/05/19 0433 11/06/19 0439 11/07/19 0527 11/08/19 0028  NA 132* 133* 138 134* 137  --   --   K 3.1* 3.0* 4.4 3.4* 3.6  --   --   CL 98 101 108 104 102  --   --   CO2 17* 23 24 24 25   --   --   GLUCOSE 106* 107* 100* 106* 101*  --   --   BUN 7 9 10  5* <5*  --   --   CREATININE 0.80 0.63 0.64 0.65 0.62  --   --   CALCIUM 8.7* 7.9* 8.3* 7.9* 8.0*  --   --   MG  --   --  1.8 1.5* 1.8 1.9 1.8   GFR: Estimated Creatinine Clearance:  92.6 mL/min (by C-G formula based on SCr of 0.62 mg/dL). Liver Function Tests: Recent Labs  Lab 11/02/19 0617 11/04/19 0428  AST 17 14*  ALT 13 13  ALKPHOS 72 59  BILITOT 1.7* 0.9  PROT 7.7 5.5*  ALBUMIN 2.6* 1.7*   No results for input(s): LIPASE, AMYLASE in the last 168 hours. No results for input(s): AMMONIA in the last 168 hours. Coagulation Profile: Recent Labs  Lab 11/04/19 0428  INR 1.4*   Cardiac Enzymes: No results for input(s): CKTOTAL, CKMB, CKMBINDEX, TROPONINI in the last 168 hours. BNP (last 3 results) No results for input(s): PROBNP in the last 8760 hours. HbA1C: No results for input(s): HGBA1C in the last 72 hours. CBG: No results for input(s): GLUCAP in the last 168 hours. Lipid Profile: No results for input(s): CHOL, HDL, LDLCALC, TRIG, CHOLHDL, LDLDIRECT in the last 72 hours. Thyroid Function Tests: No results for input(s): TSH, T4TOTAL, FREET4, T3FREE, THYROIDAB in the last 72 hours. Anemia Panel: Recent Labs    11/07/19 1204  VITAMINB12 1,181*  FOLATE 10.2  FERRITIN 529*  TIBC 132*  IRON 57   Sepsis Labs: Recent Labs  Lab 11/02/19 1412 11/02/19 1523 11/03/19 0352  LATICACIDVEN 2.2* 1.5 1.0    Recent Results (from the past 240 hour(s))  SARS Coronavirus 2 by RT PCR (hospital order, performed in [facility] hospital lab) Nasopharyngeal  Nasopharyngeal Swab     Status: None   Collection Time: 11/02/19  3:59 PM   Specimen: Nasopharyngeal Swab  Result Value Ref Range Status   SARS Coronavirus 2 NEGATIVE NEGATIVE Final    Comment: (NOTE) SARS-CoV-2 target nucleic acids are NOT DETECTED.  The SARS-CoV-2 RNA is generally detectable in upper and lower respiratory specimens during the acute phase of infection. The lowest concentration of SARS-CoV-2 viral copies this assay can detect is 250 copies / mL. A negative result does not preclude SARS-CoV-2 infection and should not be used as the sole basis for treatment or other patient management decisions.  A negative result may occur with improper specimen collection / handling, submission of specimen other than nasopharyngeal swab, presence of viral mutation(s) within the areas targeted by this assay, and inadequate number of viral copies (<250 copies / mL). A negative result must be combined with clinical observations, patient history, and epidemiological information.  Fact Sheet for Patients:   StrictlyIdeas.no  Fact Sheet for Healthcare Providers: BankingDealers.co.za  This test is not yet approved or  cleared by the [place] FDA and has been authorized for detection and/or diagnosis of SARS-CoV-2 by FDA under an Emergency Use Authorization (EUA).  This EUA will remain in effect (meaning this test can be used) for the duration of the COVID-19 declaration under Section 564(b)(1) of the Act, 21 U.S.C. section 360bbb-3(b)(1), unless the authorization is terminated or revoked sooner.  Performed at [facility], [place] [address] [place], [place] [address]   Culture, blood (routine x 2)     Status: None   Collection Time: 11/03/19  3:52 AM   Specimen: BLOOD  Result Value Ref Range Status   Specimen Description BLOOD  Final   Special Requests   Final    LEFT CEPHELIC BOTTLES DRAWN AEROBIC AND ANAEROBIC Blood Culture  adequate volume   Culture   Final    NO GROWTH 5 DAYS Performed at [facility], [place] [address] [place], [place] [address]    Report Status 11/08/2019 FINAL  Final  Culture, blood (routine x 2)  Status: None   Collection Time: 11/03/19  4:02 AM   Specimen: BLOOD  Result Value Ref Range Status   Specimen Description BLOOD LEFT ANTECUBITAL  Final   Special Requests   Final    BOTTLES DRAWN AEROBIC AND ANAEROBIC Blood Culture results may not be optimal due to an excessive volume of blood received in culture bottles   Culture   Final    NO GROWTH 5 DAYS Performed at [facility], [place] [address] [place], [place] [address]    Report Status 11/08/2019 FINAL  Final  Aerobic/Anaerobic Culture (surgical/deep wound)     Status: None (Preliminary result)   Collection Time: 11/04/19  9:53 AM   Specimen: Abscess  Result Value Ref Range Status   Specimen Description ABSCESS  Final   Special Requests ABDOMEN  Final   Gram Stain   Final    ABUNDANT WBC PRESENT, PREDOMINANTLY PMN ABUNDANT GRAM POSITIVE COCCI IN PAIRS MODERATE GRAM VARIABLE ROD    Culture   Final    MODERATE ESCHERICHIA COLI MODERATE PSEUDOMONAS AERUGINOSA ABUNDANT STREPTOCOCCUS GROUP C SUSCEPTIBILITIES TO FOLLOW MIXED ANAEROBIC FLORA PRESENT.  CALL LAB IF FURTHER IID REQUIRED. CULTURE REINCUBATED FOR BETTER GROWTH Performed at [facility], [place] [address] [place], [place] [address]    Report Status PENDING  Incomplete         Radiology Studies: No results found.  Scheduled Meds: . acetaminophen  1,000 mg Oral Q8H  . enoxaparin (LOVENOX) injection  40 mg Subcutaneous Q24H  . feeding supplement (KATE FARMS STANDARD 1.4)  325 mL Oral BID BM  . sodium chloride flush  5 mL Intracatheter Q8H   Continuous Infusions: . sodium chloride 75 mL/hr at 11/07/19 0603  . piperacillin-tazobactam (ZOSYN)  IV 3.375 g (11/08/19 1791)    Darliss Cheney, MD [facility] 11/08/2019, 1:27 PM

## 2019-11-08 NOTE — Progress Notes (Signed)
Patient ID: [name], female   DOB: Jan 19, 1966, 54 y.o.   MRN: [id number]  [facility] Progress Note:   * No surgery found *  Subjective: Mental status is alert.  Complaints feeling better. Objective: Vital signs in last 24 hours: Temp:  [97.8 F (36.6 C)-98.5 F (36.9 C)] 97.9 F (36.6 C) (08/07 0915) Pulse Rate:  [85-116] 95 (08/07 0915) Resp:  [16-20] 18 (08/07 0915) BP: (114-138)/(83-92) 114/89 (08/07 0915) SpO2:  [97 %-100 %] 100 % (08/07 0915)  Intake/Output from previous day: 08/06 0701 - 08/07 0700 In: 1616.2 [P.O.:540; I.V.:927.4; IV Piggyback:148.9] Out: 160 [Drains:160] Intake/Output this shift: No intake/output data recorded.  Physical Exam: Work of breathing is not labored;  Drain in place;    Lab Results:  Results for orders placed or performed during the hospital encounter of 11/02/19 (from the past 48 hour(s))  CBC with Differential/Platelet     Status: Abnormal   Collection Time: 11/07/19  5:27 AM  Result Value Ref Range   WBC 10.9 (H) 4.0 - 10.5 K/uL   RBC 3.34 (L) 3.87 - 5.11 MIL/uL   Hemoglobin 7.7 (L) 12.0 - 15.0 g/dL    Comment: Reticulocyte Hemoglobin testing may be clinically indicated, consider ordering this additional test [id number]    HCT 23.0 (L) 36 - 46 %   MCV 68.9 (L) 80.0 - 100.0 fL   MCH 23.1 (L) 26.0 - 34.0 pg   MCHC 33.5 30.0 - 36.0 g/dL   RDW 38.2 (H) 50.5 - 39.7 %   Platelets 409 (H) 150 - 400 K/uL   nRBC 0.0 0.0 - 0.2 %   Neutrophils Relative % 62 %   Neutro Abs 6.9 1.7 - 7.7 K/uL   Lymphocytes Relative 22 %   Lymphs Abs 2.4 0.7 - 4.0 K/uL   Monocytes Relative 8 %   Monocytes Absolute 0.8 0 - 1 K/uL   Eosinophils Relative 3 %   Eosinophils Absolute 0.3 0 - 0 K/uL   Basophils Relative 1 %   Basophils Absolute 0.1 0 - 0 K/uL   Immature Granulocytes 4 %   Abs Immature Granulocytes 0.47 (H) 0.00 - 0.07 K/uL    Comment: Performed at [facility], [address], [place], [place] [address]  Magnesium      Status: None   Collection Time: 11/07/19  5:27 AM  Result Value Ref Range   Magnesium 1.9 1.7 - 2.4 mg/dL    Comment: Performed at [facility], [address] [place], [place] [address]  Iron and TIBC     Status: Abnormal   Collection Time: 11/07/19 12:04 PM  Result Value Ref Range   Iron 57 28 - 170 ug/dL   TIBC 240 (L) 973 - 532 ug/dL   Saturation Ratios 43 (H) 10.4 - 31.8 %   UIBC 75 ug/dL    Comment: Performed at [facility], [address], [place], [place] [address]  Ferritin     Status: Abnormal   Collection Time: 11/07/19 12:04 PM  Result Value Ref Range   Ferritin 529 (H) 11 - 307 ng/mL    Comment: Performed at [facility], [address], [place], [place] [address]  Transferrin     Status: Abnormal   Collection Time: 11/07/19 12:04 PM  Result Value Ref Range   Transferrin 94 (L) 192 - 382 mg/dL    Comment: Performed at [facility], [address], [place], [place] [address]  Folate,  serum, performed at [facility] lab     Status: None   Collection Time: 11/07/19 12:04 PM  Result Value Ref Range   Folate 10.2 >5.9 ng/mL    Comment: Performed at [facility], [address] [place], [place] [address]  Vitamin B12     Status: Abnormal   Collection Time: 11/07/19 12:04 PM  Result Value Ref Range   Vitamin B-12 1,181 (H) 180 - 914 pg/mL    Comment: (NOTE) This assay is not validated for testing neonatal or myeloproliferative syndrome specimens for Vitamin B12 levels. Performed at [facility], [address] [place], [place] [address]   CBC with Differential/Platelet     Status: Abnormal   Collection Time: 11/08/19 12:28 AM  Result Value Ref Range   WBC 10.8 (H) 4.0 - 10.5 K/uL   RBC 3.51 (L) 3.87 - 5.11 MIL/uL   Hemoglobin 8.1 (L) 12.0 - 15.0 g/dL    Comment: Reticulocyte Hemoglobin testing may be clinically indicated, consider ordering this additional test [id number]    HCT 24.6 (L) 36 - 46 %   MCV 70.1 (L) 80.0 - 100.0  fL   MCH 23.1 (L) 26.0 - 34.0 pg   MCHC 32.9 30.0 - 36.0 g/dL   RDW 63.0 (H) 16.0 - 10.9 %   Platelets 463 (H) 150 - 400 K/uL   nRBC 0.2 0.0 - 0.2 %   Neutrophils Relative % 56 %   Neutro Abs 6.0 1.7 - 7.7 K/uL   Lymphocytes Relative 27 %   Lymphs Abs 2.9 0.7 - 4.0 K/uL   Monocytes Relative 9 %   Monocytes Absolute 1.0 0 - 1 K/uL   Eosinophils Relative 2 %   Eosinophils Absolute 0.3 0 - 0 K/uL   Basophils Relative 1 %   Basophils Absolute 0.1 0 - 0 K/uL   Immature Granulocytes 5 %   Abs Immature Granulocytes 0.53 (H) 0.00 - 0.07 K/uL    Comment: Performed at [facility], [address], [place], [place] [address]  Magnesium     Status: None   Collection Time: 11/08/19 12:28 AM  Result Value Ref Range   Magnesium 1.8 1.7 - 2.4 mg/dL    Comment: Performed at [facility], [address] [place], [place] [address]    Radiology/Results: No results found.  Anti-infectives: Anti-infectives (From admission, onward)   Start     Dose/Rate Route Frequency Ordered Stop   11/03/19 0400  piperacillin-tazobactam (ZOSYN) IVPB 3.375 g     Discontinue     3.375 g 12.5 mL/hr over 240 Minutes Intravenous Every 8 hours 11/03/19 0240     11/02/19 2000  metroNIDAZOLE (FLAGYL) IVPB 500 mg  Status:  Discontinued        500 mg 100 mL/hr over 60 Minutes Intravenous Every 8 hours 11/02/19 1602 11/03/19 0223   11/02/19 1515  cefTRIAXone (ROCEPHIN) 1 g in sodium chloride 0.9 % 100 mL IVPB        1 g 200 mL/hr over 30 Minutes Intravenous  Once 11/02/19 1504 11/02/19 1643   11/02/19 1515  metroNIDAZOLE (FLAGYL) IVPB 500 mg        500 mg 100 mL/hr over 60 Minutes Intravenous  Once 11/02/19 1504 11/02/19 1753      Assessment/Plan: Problem List: Patient Active Problem List   Diagnosis Date Noted  . Severe sepsis (HCC) 11/06/2019  . Diverticulitis 11/02/2019  . Intra-abdominal abscess (HCC) 11/02/2019    Discussed management of  diverticulitis with IR followup.  She reports no  pneumaturia.   * No surgery found *    LOS: 6 days   Matt B. Daphine Deutscher, MD, [facility] [phone] to reach the surgeon on call.    11/08/2019 9:48 AM

## 2019-11-09 LAB — CBC WITH DIFFERENTIAL/PLATELET
Abs Immature Granulocytes: 0.1 10*3/uL — ABNORMAL HIGH (ref 0.00–0.07)
Basophils Absolute: 0.1 10*3/uL (ref 0.0–0.1)
Basophils Relative: 1 %
Eosinophils Absolute: 0.2 10*3/uL (ref 0.0–0.5)
Eosinophils Relative: 2 %
HCT: 25.4 % — ABNORMAL LOW (ref 36.0–46.0)
Hemoglobin: 8.4 g/dL — ABNORMAL LOW (ref 12.0–15.0)
Lymphocytes Relative: 24 %
Lymphs Abs: 2.4 10*3/uL (ref 0.7–4.0)
MCH: 23 pg — ABNORMAL LOW (ref 26.0–34.0)
MCHC: 33.1 g/dL (ref 30.0–36.0)
MCV: 69.4 fL — ABNORMAL LOW (ref 80.0–100.0)
Monocytes Absolute: 0.3 10*3/uL (ref 0.1–1.0)
Monocytes Relative: 3 %
Myelocytes: 1 %
Neutro Abs: 6.8 10*3/uL (ref 1.7–7.7)
Neutrophils Relative %: 69 %
Platelets: 455 10*3/uL — ABNORMAL HIGH (ref 150–400)
RBC: 3.66 MIL/uL — ABNORMAL LOW (ref 3.87–5.11)
RDW: 17.1 % — ABNORMAL HIGH (ref 11.5–15.5)
WBC: 9.9 10*3/uL (ref 4.0–10.5)
nRBC: 0 % (ref 0.0–0.2)
nRBC: 1 /100 WBC — ABNORMAL HIGH

## 2019-11-09 LAB — MAGNESIUM: Magnesium: 1.9 mg/dL (ref 1.7–2.4)

## 2019-11-09 NOTE — Progress Notes (Signed)
Patient ID: [name], female   DOB: February 26, 1966, 54 y.o.   MRN: [id number]  PROGRESS NOTE    [name]  HEN:[id number] DOB: [birth year] DOA: 11/02/2019 PCP: Kristen Loader, FNP   Brief Narrative:  [name] is a 54 y.o. female with medical history significant of hypertension, recurrent diverticulitis, presented with lower abdominal pain and lump.  Patient has had multiple episodes of diverticulitis in the past 2 to 3 years and has been managed by her PCP with p.o. antibiotics. Most recent episode with 3 weeks ago, when she was treated with Cipro and Flagyl for about 2 weeks. Her symptoms was improved for about one week then, over past 2-3 days, she started to have cramping like lower abdominal pain, and she started to feel a lump growing in size in the last 2 days with tenderness.  Upon arrival to ED, WBC 18.6, lactic acid 2.2, CT abdominal with contrast: Marked thickening of the walls of the sigmoid colon indicating acute diverticulitis. Multiple abscess collections extending inferior and anterior to the affected segment of the sigmoid colon.  She was admitted under hospitalist service, started on antibiotics and general surgery consulted. She was also diagnosed with severe sepsis based on tachycardia, leukocytosis and lactic acid of 2.2.  She underwent percutaneous drain placement by IR on 11/04/2019.  Abdominal abscess culture is growing E. coli, Pseudomonas and Streptococcus.  Blood culture remain negative.  She remains on Zosyn.  Assessment & Plan:   Severe sepsis secondary to acute recurrent diverticulitis with multiple intra-abdominal abscesses, present on admission Developing colovesical fistula: Patient met severe sepsis at the time of admission based on tachycardia, leukocytosis and lactic acid of 2.2. -CT abdomen on presentation showed acute diverticulitis with multiple abscesses with concern for developing colovesical fistula -General surgery has been consulted and recommend IR  for possible drain placement.   -Status post percutaneous drain placement by IR on 11/04/2019.  Abdominal abscess culture is growing E. coli, Pseudomonas and Streptococcus.  Blood culture remain negative.  Patient has no abdominal pain or tenderness today. Continues to be on full liquid diet and Zosyn per general surgery.  She remains afebrile.  Leukocytosis finally resolved.  Patient wondering about plan of care and discharge plan which is unclear to patient and me.  RN at the bedside.  RN to reach out to general surgery about addressing this question.  Hyponatremia: Resolved.  Continue to hold HCTZ while her blood pressure is within normal range.  Hypokalemia: Resolved.  Hypertension: Controlled. -HCTZ and ACE inhibitor held.  Continue as needed hydralazine.  Microcytic anemia: Presented with a hemoglobin of 9.9 with low MCV.  Now down to 7.7.  Will check iron studies, FOBT, folate and B12.  Monitor daily.  Transfuse if less than 7.  DVT prophylaxis: Lovenox Code Status: Full Family Communication: No family present at bedside.  Patient competent and alert. Disposition Plan: Status is: Inpatient  Remains inpatient appropriate because:Inpatient level of care appropriate due to severity of illness   Dispo: The patient is from: Home              Anticipated d/c is to: Home              Anticipated d/c date is: > 3 days              Patient currently is not medically stable to d/c.   Consultants: General surgery/IR  Procedures: Intra-abdominal percutaneous drain placed  Antimicrobials:  Anti-infectives (From admission, onward)   Start  Dose/Rate Route Frequency Ordered Stop   11/03/19 0400  piperacillin-tazobactam (ZOSYN) IVPB 3.375 g     Discontinue     3.375 g 12.5 mL/hr over 240 Minutes Intravenous Every 8 hours 11/03/19 0240     11/02/19 2000  metroNIDAZOLE (FLAGYL) IVPB 500 mg  Status:  Discontinued        500 mg 100 mL/hr over 60 Minutes Intravenous Every 8 hours 11/02/19  1602 11/03/19 0223   11/02/19 1515  cefTRIAXone (ROCEPHIN) 1 g in sodium chloride 0.9 % 100 mL IVPB        1 g 200 mL/hr over 30 Minutes Intravenous  Once 11/02/19 1504 11/02/19 1643   11/02/19 1515  metroNIDAZOLE (FLAGYL) IVPB 500 mg        500 mg 100 mL/hr over 60 Minutes Intravenous  Once 11/02/19 1504 11/02/19 1753       Subjective: Patient seen and examined.  She states that she still has abdominal pain but no worse than yesterday.  Interestingly, she is completely nontender on deep palpation.  Passing gas but no bowel movement in last 4 days.  Objective: Vitals:   11/08/19 2103 11/09/19 0040 11/09/19 0555 11/09/19 0931  BP: 118/87 120/84 139/82 (!) 135/92  Pulse: 95 91 96 (!) 103  Resp: 18 16 18 18   Temp: 98.3 F (36.8 C) 98.3 F (36.8 C) 98.5 F (36.9 C) 98.5 F (36.9 C)  TempSrc: Oral Oral Oral Oral  SpO2: 100% 97% 100% 99%  Weight:   96.1 kg   Height:        Intake/Output Summary (Last 24 hours) at 11/09/2019 1158 Last data filed at 11/09/2019 0529 Gross per 24 hour  Intake 65 ml  Output 60 ml  Net 5 ml   Filed Weights   11/05/19 0357 11/06/19 0729 11/09/19 0555  Weight: 95.2 kg 95.6 kg 96.1 kg    Examination:  General exam: Appears calm and comfortable  Respiratory system: Clear to auscultation. Respiratory effort normal. Cardiovascular system: S1 & S2 heard, RRR. No JVD, murmurs, rubs, gallops or clicks. No pedal edema. Gastrointestinal system: Abdomen is nondistended, soft and nontender. No organomegaly or masses felt. Normal bowel sounds heard.  2 percutaneous drains noted with serous fluid Central nervous system: Alert and oriented. No focal neurological deficits. Extremities: Symmetric 5 x 5 power. Skin: No rashes, lesions or ulcers.  Psychiatry: Judgement and insight appear normal. Mood & affect appropriate.   Data Reviewed: I have personally reviewed following labs and imaging studies  CBC: Recent Labs  Lab 11/05/19 0433 11/06/19 0439  11/07/19 0527 11/08/19 0028 11/09/19 0553  WBC 16.8* 14.0* 10.9* 10.8* 9.9  NEUTROABS 13.2* 9.8* 6.9 6.0 6.8  HGB 8.0* 7.8* 7.7* 8.1* 8.4*  HCT 23.7* 22.5* 23.0* 24.6* 25.4*  MCV 69.1* 68.6* 68.9* 70.1* 69.4*  PLT 382 389 409* 463* 945*   Basic Metabolic Panel: Recent Labs  Lab 11/03/19 0352 11/04/19 0428 11/04/19 0428 11/05/19 0433 11/06/19 0439 11/07/19 0527 11/08/19 0028 11/09/19 0553  NA 133* 138  --  134* 137  --   --   --   K 3.0* 4.4  --  3.4* 3.6  --   --   --   CL 101 108  --  104 102  --   --   --   CO2 23 24  --  24 25  --   --   --   GLUCOSE 107* 100*  --  106* 101*  --   --   --  BUN 9 10  --  5* <5*  --   --   --   CREATININE 0.63 0.64  --  0.65 0.62  --   --   --   CALCIUM 7.9* 8.3*  --  7.9* 8.0*  --   --   --   MG  --  1.8   < > 1.5* 1.8 1.9 1.8 1.9   < > = values in this interval not displayed.   GFR: Estimated Creatinine Clearance: 92.8 mL/min (by C-G formula based on SCr of 0.62 mg/dL). Liver Function Tests: Recent Labs  Lab 11/04/19 0428  AST 14*  ALT 13  ALKPHOS 59  BILITOT 0.9  PROT 5.5*  ALBUMIN 1.7*   No results for input(s): LIPASE, AMYLASE in the last 168 hours. No results for input(s): AMMONIA in the last 168 hours. Coagulation Profile: Recent Labs  Lab 11/04/19 0428  INR 1.4*   Cardiac Enzymes: No results for input(s): CKTOTAL, CKMB, CKMBINDEX, TROPONINI in the last 168 hours. BNP (last 3 results) No results for input(s): PROBNP in the last 8760 hours. HbA1C: No results for input(s): HGBA1C in the last 72 hours. CBG: No results for input(s): GLUCAP in the last 168 hours. Lipid Profile: No results for input(s): CHOL, HDL, LDLCALC, TRIG, CHOLHDL, LDLDIRECT in the last 72 hours. Thyroid Function Tests: No results for input(s): TSH, T4TOTAL, FREET4, T3FREE, THYROIDAB in the last 72 hours. Anemia Panel: Recent Labs    11/07/19 1204  VITAMINB12 1,181*  FOLATE 10.2  FERRITIN 529*  TIBC 132*  IRON 57   Sepsis  Labs: Recent Labs  Lab 11/02/19 1412 11/02/19 1523 11/03/19 0352  LATICACIDVEN 2.2* 1.5 1.0    Recent Results (from the past 240 hour(s))  SARS Coronavirus 2 by RT PCR (hospital order, performed in [facility] hospital lab) Nasopharyngeal Nasopharyngeal Swab     Status: None   Collection Time: 11/02/19  3:59 PM   Specimen: Nasopharyngeal Swab  Result Value Ref Range Status   SARS Coronavirus 2 NEGATIVE NEGATIVE Final    Comment: (NOTE) SARS-CoV-2 target nucleic acids are NOT DETECTED.  The SARS-CoV-2 RNA is generally detectable in upper and lower respiratory specimens during the acute phase of infection. The lowest concentration of SARS-CoV-2 viral copies this assay can detect is 250 copies / mL. A negative result does not preclude SARS-CoV-2 infection and should not be used as the sole basis for treatment or other patient management decisions.  A negative result may occur with improper specimen collection / handling, submission of specimen other than nasopharyngeal swab, presence of viral mutation(s) within the areas targeted by this assay, and inadequate number of viral copies (<250 copies / mL). A negative result must be combined with clinical observations, patient history, and epidemiological information.  Fact Sheet for Patients:   StrictlyIdeas.no  Fact Sheet for Healthcare Providers: BankingDealers.co.za  This test is not yet approved or  cleared by the [place] FDA and has been authorized for detection and/or diagnosis of SARS-CoV-2 by FDA under an Emergency Use Authorization (EUA).  This EUA will remain in effect (meaning this test can be used) for the duration of the COVID-19 declaration under Section 564(b)(1) of the Act, 21 U.S.C. section 360bbb-3(b)(1), unless the authorization is terminated or revoked sooner.  Performed at [facility], [place] [address], [place],  [address]   Culture, blood  (routine x 2)     Status: None   Collection Time: 11/03/19  3:52 AM   Specimen: BLOOD  Result Value Ref Range Status   Specimen Description BLOOD  Final   Special Requests   Final    LEFT CEPHELIC BOTTLES DRAWN AEROBIC AND ANAEROBIC Blood Culture adequate volume   Culture   Final    NO GROWTH 5 DAYS Performed at [facility], [place] [address], [place], [place] [address]    Report Status 11/08/2019 FINAL  Final  Culture, blood (routine x 2)     Status: None   Collection Time: 11/03/19  4:02 AM   Specimen: BLOOD  Result Value Ref Range Status   Specimen Description BLOOD LEFT ANTECUBITAL  Final   Special Requests   Final    BOTTLES DRAWN AEROBIC AND ANAEROBIC Blood Culture results may not be optimal due to an excessive volume of blood received in culture bottles   Culture   Final    NO GROWTH 5 DAYS Performed at [facility], [place] [address] [place], [place] [address]    Report Status 11/08/2019 FINAL  Final  Aerobic/Anaerobic Culture (surgical/deep wound)     Status: None (Preliminary result)   Collection Time: 11/04/19  9:53 AM   Specimen: Abscess  Result Value Ref Range Status   Specimen Description ABSCESS  Final   Special Requests ABDOMEN  Final   Gram Stain   Final    ABUNDANT WBC PRESENT, PREDOMINANTLY PMN ABUNDANT GRAM POSITIVE COCCI IN PAIRS MODERATE GRAM VARIABLE ROD    Culture   Final    MODERATE ESCHERICHIA COLI MODERATE PSEUDOMONAS AERUGINOSA ABUNDANT STREPTOCOCCUS GROUP C SUSCEPTIBILITIES TO FOLLOW MIXED ANAEROBIC FLORA PRESENT.  CALL LAB IF FURTHER IID REQUIRED. CULTURE REINCUBATED FOR BETTER GROWTH Performed at [facility], [place] [address] [place], [place] [address]    Report Status PENDING  Incomplete         Radiology Studies: No results found.  Scheduled Meds: . acetaminophen  1,000 mg Oral Q8H  . enoxaparin (LOVENOX) injection  40 mg Subcutaneous Q24H  . feeding supplement (KATE FARMS STANDARD 1.4)  325 mL Oral BID BM  .  sodium chloride flush  5 mL Intracatheter Q8H   Continuous Infusions: . sodium chloride 75 mL/hr at 11/07/19 0603  . piperacillin-tazobactam (ZOSYN)  IV 3.375 g (11/09/19 0554)    Darliss Cheney, MD [facility] 11/09/2019, 11:58 AM

## 2019-11-09 NOTE — Progress Notes (Signed)
Patient ID: [name], female   DOB: [birth year], 54 y.o.   MRN: [id number] [facility] Surgery Progress Note:   * No surgery found *  Subjective: Mental status is alert.  Complaints none. Objective: Vital signs in last 24 hours: Temp:  [98.3 F (36.8 C)-98.5 F (36.9 C)] 98.5 F (36.9 C) (08/08 0931) Pulse Rate:  [91-103] 103 (08/08 0931) Resp:  [16-18] 18 (08/08 0931) BP: (118-139)/(77-92) 135/92 (08/08 0931) SpO2:  [97 %-100 %] 99 % (08/08 0931) Weight:  [96.1 kg] 96.1 kg (08/08 0555)  Intake/Output from previous day: 08/07 0701 - 08/08 0700 In: 65 [I.V.:15] Out: 60 [Drains:60] Intake/Output this shift: No intake/output data recorded.  Physical Exam: Work of breathing is normal-wearing her N95 mask;  Drain in place.    Lab Results:  Results for orders placed or performed during the hospital encounter of 11/02/19 (from the past 48 hour(s))  Iron and TIBC     Status: Abnormal   Collection Time: 11/07/19 12:04 PM  Result Value Ref Range   Iron 57 28 - 170 ug/dL   TIBC 101 (L) 751 - 025 ug/dL   Saturation Ratios 43 (H) 10.4 - 31.8 %   UIBC 75 ug/dL    Comment: Performed at [facility], [address], [place], [place] [address]  Ferritin     Status: Abnormal   Collection Time: 11/07/19 12:04 PM  Result Value Ref Range   Ferritin 529 (H) 11 - 307 ng/mL    Comment: Performed at [facility], [address], [place], [place] [address]  Transferrin     Status: Abnormal   Collection Time: 11/07/19 12:04 PM  Result Value Ref Range   Transferrin 94 (L) 192 - 382 mg/dL    Comment: Performed at [facility], [address], [place], [place] [address]  Folate, serum, performed at [facility] lab     Status: None   Collection Time: 11/07/19 12:04 PM  Result Value Ref Range   Folate 10.2 >5.9 ng/mL    Comment: Performed at [facility], [address], [place], [place] [address]  Vitamin B12     Status: Abnormal   Collection Time: 11/07/19 12:04  PM  Result Value Ref Range   Vitamin B-12 1,181 (H) 180 - 914 pg/mL    Comment: (NOTE) This assay is not validated for testing neonatal or myeloproliferative syndrome specimens for Vitamin B12 levels. Performed at [facility], [address] [place], [place] [address]   CBC with Differential/Platelet     Status: Abnormal   Collection Time: 11/08/19 12:28 AM  Result Value Ref Range   WBC 10.8 (H) 4.0 - 10.5 K/uL   RBC 3.51 (L) 3.87 - 5.11 MIL/uL   Hemoglobin 8.1 (L) 12.0 - 15.0 g/dL    Comment: Reticulocyte Hemoglobin testing may be clinically indicated, consider ordering this additional test [id number]    HCT 24.6 (L) 36 - 46 %   MCV 70.1 (L) 80.0 - 100.0 fL   MCH 23.1 (L) 26.0 - 34.0 pg   MCHC 32.9 30.0 - 36.0 g/dL   RDW 67.1 (H) 24.5 - 80.9 %   Platelets 463 (H) 150 - 400 K/uL   nRBC 0.2 0.0 - 0.2 %   Neutrophils Relative % 56 %   Neutro Abs 6.0 1.7 - 7.7 K/uL   Lymphocytes Relative 27 %   Lymphs Abs 2.9 0.7 - 4.0 K/uL   Monocytes Relative 9 %  Monocytes Absolute 1.0 0 - 1 K/uL   Eosinophils Relative 2 %   Eosinophils Absolute 0.3 0 - 0 K/uL   Basophils Relative 1 %   Basophils Absolute 0.1 0 - 0 K/uL   Immature Granulocytes 5 %   Abs Immature Granulocytes 0.53 (H) 0.00 - 0.07 K/uL    Comment: Performed at [facility], [address] [place], [place] [address]  Magnesium     Status: None   Collection Time: 11/08/19 12:28 AM  Result Value Ref Range   Magnesium 1.8 1.7 - 2.4 mg/dL    Comment: Performed at [facility], [address], [place], [place] [address]  CBC with Differential/Platelet     Status: Abnormal   Collection Time: 11/09/19  5:53 AM  Result Value Ref Range   WBC 9.9 4.0 - 10.5 K/uL   RBC 3.66 (L) 3.87 - 5.11 MIL/uL   Hemoglobin 8.4 (L) 12.0 - 15.0 g/dL    Comment: Reticulocyte Hemoglobin testing may be clinically indicated, consider ordering this additional test [id number]    HCT 25.4 (L) 36 - 46 %   MCV 69.4 (L) 80.0 -  100.0 fL   MCH 23.0 (L) 26.0 - 34.0 pg   MCHC 33.1 30.0 - 36.0 g/dL   RDW 53.6 (H) 64.4 - 03.4 %   Platelets 455 (H) 150 - 400 K/uL    Comment: REPEATED TO VERIFY   nRBC 0.0 0.0 - 0.2 %   Neutrophils Relative % 69 %   Neutro Abs 6.8 1.7 - 7.7 K/uL   Lymphocytes Relative 24 %   Lymphs Abs 2.4 0.7 - 4.0 K/uL   Monocytes Relative 3 %   Monocytes Absolute 0.3 0 - 1 K/uL   Eosinophils Relative 2 %   Eosinophils Absolute 0.2 0 - 0 K/uL   Basophils Relative 1 %   Basophils Absolute 0.1 0 - 0 K/uL   nRBC 1 (H) 0 /100 WBC   Myelocytes 1 %   Abs Immature Granulocytes 0.10 (H) 0.00 - 0.07 K/uL   Target Cells PRESENT     Comment: Performed at [facility], [address], [place], [place] [address]  Magnesium     Status: None   Collection Time: 11/09/19  5:53 AM  Result Value Ref Range   Magnesium 1.9 1.7 - 2.4 mg/dL    Comment: Performed at [facility], [address], [place], [place] [address]    Radiology/Results: No results found.  Anti-infectives: Anti-infectives (From admission, onward)   Start     Dose/Rate Route Frequency Ordered Stop   11/03/19 0400  piperacillin-tazobactam (ZOSYN) IVPB 3.375 g     Discontinue     3.375 g 12.5 mL/hr over 240 Minutes Intravenous Every 8 hours 11/03/19 0240     11/02/19 2000  metroNIDAZOLE (FLAGYL) IVPB 500 mg  Status:  Discontinued        500 mg 100 mL/hr over 60 Minutes Intravenous Every 8 hours 11/02/19 1602 11/03/19 0223   11/02/19 1515  cefTRIAXone (ROCEPHIN) 1 g in sodium chloride 0.9 % 100 mL IVPB        1 g 200 mL/hr over 30 Minutes Intravenous  Once 11/02/19 1504 11/02/19 1643   11/02/19 1515  metroNIDAZOLE (FLAGYL) IVPB 500 mg        500 mg 100 mL/hr over 60 Minutes Intravenous  Once 11/02/19 1504 11/02/19 1753      Assessment/Plan: Problem List: Patient Active Problem List   Diagnosis  Date Noted  . Severe sepsis (HCC) 11/06/2019  . Diverticulitis 11/02/2019  . Intra-abdominal abscess (HCC) 11/02/2019     OK to discharge on oral antibiotics and full liquids.   * No surgery found *    LOS: 7 days   Matt B. Daphine Deutscher, MD, [facility] [phone] to reach the surgeon on call.    11/09/2019 11:58 AM

## 2019-11-10 LAB — CBC WITH DIFFERENTIAL/PLATELET
Abs Immature Granulocytes: 0.54 10*3/uL — ABNORMAL HIGH (ref 0.00–0.07)
Basophils Absolute: 0.1 10*3/uL (ref 0.0–0.1)
Basophils Relative: 1 %
Eosinophils Absolute: 0.3 10*3/uL (ref 0.0–0.5)
Eosinophils Relative: 3 %
HCT: 24 % — ABNORMAL LOW (ref 36.0–46.0)
Hemoglobin: 8.1 g/dL — ABNORMAL LOW (ref 12.0–15.0)
Immature Granulocytes: 5 %
Lymphocytes Relative: 33 %
Lymphs Abs: 3.4 10*3/uL (ref 0.7–4.0)
MCH: 23.7 pg — ABNORMAL LOW (ref 26.0–34.0)
MCHC: 33.8 g/dL (ref 30.0–36.0)
MCV: 70.2 fL — ABNORMAL LOW (ref 80.0–100.0)
Monocytes Absolute: 1 10*3/uL (ref 0.1–1.0)
Monocytes Relative: 10 %
Neutro Abs: 5.2 10*3/uL (ref 1.7–7.7)
Neutrophils Relative %: 48 %
Platelets: 424 10*3/uL — ABNORMAL HIGH (ref 150–400)
RBC: 3.42 MIL/uL — ABNORMAL LOW (ref 3.87–5.11)
RDW: 17.4 % — ABNORMAL HIGH (ref 11.5–15.5)
WBC: 10.5 10*3/uL (ref 4.0–10.5)
nRBC: 0.2 % (ref 0.0–0.2)

## 2019-11-10 LAB — AEROBIC/ANAEROBIC CULTURE W GRAM STAIN (SURGICAL/DEEP WOUND)

## 2019-11-10 LAB — MAGNESIUM: Magnesium: 2 mg/dL (ref 1.7–2.4)

## 2019-11-10 MED ORDER — LEVOFLOXACIN 500 MG PO TABS
500.0000 mg | ORAL_TABLET | Freq: Every day | ORAL | 0 refills | Status: DC
Start: 2019-11-10 — End: 2019-12-12

## 2019-11-10 MED ORDER — ONDANSETRON HCL 4 MG PO TABS: 4.0000 mg | ORAL_TABLET | Freq: Four times a day (QID) | ORAL | 0 refills | Status: DC | PRN

## 2019-11-10 MED ORDER — METRONIDAZOLE 500 MG PO TABS
500.0000 mg | ORAL_TABLET | Freq: Three times a day (TID) | ORAL | 0 refills | Status: DC
Start: 2019-11-10 — End: 2019-12-12

## 2019-11-10 MED ORDER — SACCHAROMYCES BOULARDII 250 MG PO CAPS: 250.0000 mg | ORAL_CAPSULE | Freq: Two times a day (BID) | ORAL | Status: DC

## 2019-11-10 MED ORDER — OXYCODONE HCL 5 MG PO TABS: 5.0000 mg | ORAL_TABLET | Freq: Four times a day (QID) | ORAL | 0 refills | Status: DC | PRN

## 2019-11-10 MED ORDER — FERROUS SULFATE 325 (65 FE) MG PO TABS: 325.0000 mg | ORAL_TABLET | Freq: Two times a day (BID) | ORAL | 0 refills | Status: DC

## 2019-11-10 MED ORDER — SACCHAROMYCES BOULARDII 250 MG PO CAPS
250.0000 mg | ORAL_CAPSULE | Freq: Two times a day (BID) | ORAL | Status: DC
  Administered 2019-11-10: 250 mg via ORAL
  Filled 2019-11-10: qty 1

## 2019-11-10 NOTE — Progress Notes (Signed)
[facility] Progress Note     Subjective: CC-  Sitting up in bed. Feeling well. Ready to go home. Tolerating full liquids. Last BM 3-4 days ago. Denies n/v. Comfortable with flushing drains on her own. WBC 10.5, afebrile  Objective: Vital signs in last 24 hours: Temp:  [97.9 F (36.6 C)-98.5 F (36.9 C)] 98.1 F (36.7 C) (08/09 0335) Pulse Rate:  [83-100] 100 (08/09 0335) Resp:  [16-20] 20 (08/09 0335) BP: (123-143)/(83-91) 143/87 (08/09 0335) SpO2:  [99 %-100 %] 99 % (08/09 0335) Weight:  [96.6 kg] 96.6 kg (08/09 0335) Last BM Date: 11/04/19 (provider aware)  Intake/Output from previous day: 08/08 0701 - 08/09 0700 In: 10  Out: 85 [Drains:85] Intake/Output this shift: No intake/output data recorded.  PE: Gen: Awake and alert, NAD Heart:RRR Lungs: Normal rate and effort  Abd: Soft, mild distension,NT, +BS JP x 2supapubic region - both with cloudy/SS drainage  Lab Results:  Recent Labs    11/09/19 0553 11/10/19 0332  WBC 9.9 10.5  HGB 8.4* 8.1*  HCT 25.4* 24.0*  PLT 455* 424*   BMET No results for input(s): NA, K, CL, CO2, GLUCOSE, BUN, CREATININE, CALCIUM in the last 72 hours. PT/INR No results for input(s): LABPROT, INR in the last 72 hours. CMP     Component Value Date/Time   NA 137 11/06/2019 0439   K 3.6 11/06/2019 0439   CL 102 11/06/2019 0439   CO2 25 11/06/2019 0439   GLUCOSE 101 (H) 11/06/2019 0439   BUN <5 (L) 11/06/2019 0439   CREATININE 0.62 11/06/2019 0439   CALCIUM 8.0 (L) 11/06/2019 0439   PROT 5.5 (L) 11/04/2019 0428   ALBUMIN 1.7 (L) 11/04/2019 0428   AST 14 (L) 11/04/2019 0428   ALT 13 11/04/2019 0428   ALKPHOS 59 11/04/2019 0428   BILITOT 0.9 11/04/2019 0428   GFRNONAA >60 11/06/2019 0439   GFRAA >60 11/06/2019 0439   Lipase  No results found for: LIPASE     Studies/Results: No results found.  Anti-infectives: Anti-infectives (From admission, onward)   Start     Dose/Rate Route Frequency  Ordered Stop   11/03/19 0400  piperacillin-tazobactam (ZOSYN) IVPB 3.375 g     Discontinue     3.375 g 12.5 mL/hr over 240 Minutes Intravenous Every 8 hours 11/03/19 0240     11/02/19 2000  metroNIDAZOLE (FLAGYL) IVPB 500 mg  Status:  Discontinued        500 mg 100 mL/hr over 60 Minutes Intravenous Every 8 hours 11/02/19 1602 11/03/19 0223   11/02/19 1515  cefTRIAXone (ROCEPHIN) 1 g in sodium chloride 0.9 % 100 mL IVPB        1 g 200 mL/hr over 30 Minutes Intravenous  Once 11/02/19 1504 11/02/19 1643   11/02/19 1515  metroNIDAZOLE (FLAGYL) IVPB 500 mg        500 mg 100 mL/hr over 60 Minutes Intravenous  Once 11/02/19 1504 11/02/19 1753       Assessment/Plan Hx of HTN Hypokalemia- resolved Anemia - hgb stable at 8.1 - Per TRH -   Diverticulitis with multiple abscess Possible devolping colovesical fistula - CT with multiple associated abscess collections extending inferior and anterior to the affected segment of the sigmoid colon. Tthe major components appear to be contiguous from just above the level of the bladder to the anterior abdominal wall - s/p IR perc drain x 2 11/04/19. Cx'sgrowing E. Coli, Strep C, pseudomonas. Report pending. -Maintain FLD - Hopefully willcontinue to beable to avoid surgical  intervention during admission, as this would result in colostomy creation. Will need colonoscopy after resolution of this acute episode and outpatient follow up with colorectal surgery for sigmoid colectomy. - OOB mobilize!Pulm toilet  FEN -FLD VTE - SCDs, Lovenox  ID - Rocephin/Flagyl x 1. Zosyn 8/2 >>day#7.WBCdown at 10.5, afebrile  Plan: Ok for discharge from surgical standpoint. Discussed culture with pharmacy and will send her with levaquin and flagyl. Continue drains. Follow up with IR. Follow up with Dr. Cliffton Asters scheduled for 9/1. I will send rx for oxycodone and abx to pharmacy.   LOS: 8 days    Franne Forts, [facility] 11/10/2019, 9:55  AM Please see Amion for pager number during day hours 7:00am-4:30pm

## 2019-11-10 NOTE — Discharge Instructions (Signed)
 Diverticulitis  Diverticulitis is when small pockets in your large intestine (colon) get infected or swollen. This causes stomach pain and watery poop (diarrhea). These pouches are called diverticula. They form in people who have a condition called diverticulosis. Follow these instructions at home: Medicines  Take over-the-counter and prescription medicines only as told by your doctor. These include: ? Antibiotics. ? Pain medicines. ? Fiber pills. ? Probiotics. ? Stool softeners.  Do not drive or use heavy machinery while taking prescription pain medicine.  If you were prescribed an antibiotic, take it as told. Do not stop taking it even if you feel better. General instructions   Follow a diet as told by your doctor.  When you feel better, your doctor may tell you to change your diet. You may need to eat a lot of fiber. Fiber makes it easier to poop (have bowel movements). Healthy foods with fiber include: ? Berries. ? Beans. ? Lentils. ? Green vegetables.  Exercise 3 or more times a week. Aim for 30 minutes each time. Exercise enough to sweat and make your heart beat faster.  Keep all follow-up visits as told. This is important. You may need to have an exam of the large intestine. This is called a colonoscopy. Contact a doctor if:  Your pain does not get better.  You have a hard time eating or drinking.  You are not pooping like normal. Get help right away if:  Your pain gets worse.  Your problems do not get better.  Your problems get worse very fast.  You have a fever.  You throw up (vomit) more than one time.  You have poop that is: ? Bloody. ? Black. ? Tarry. Summary  Diverticulitis is when small pockets in your large intestine (colon) get infected or swollen.  Take medicines only as told by your doctor.  Follow a diet as told by your doctor. This information is not intended to replace advice given to you by your health care provider. Make sure you  discuss any questions you have with your health care provider. Document Revised: 03/02/2017 Document Reviewed: 04/06/2016 Elsevier Patient Education  2020 [employer] Percutaneous Abscess Drain, Care After This sheet gives you information about how to care for yourself after your procedure. Your health care provider may also give you more specific instructions. If you have problems or questions, contact your health care provider. What can I expect after the procedure? After your procedure, it is common to have:  A small amount of bruising and discomfort in the area where the drainage tube (catheter) was placed.  Sleepiness and fatigue. This should go away after the medicines you were given have worn off. Follow these instructions at home: Incision care  Follow instructions from your health care provider about how to take care of your incision. Make sure you: ? Wash your hands with soap and water before you change your bandage (dressing). If soap and water are not available, use hand sanitizer. ? Change your dressing as told by your health care provider. ? Leave stitches (sutures), skin glue, or adhesive strips in place. These skin closures may need to stay in place for 2 weeks or longer. If adhesive strip edges start to loosen and curl up, you may trim the loose edges. Do not remove adhesive strips completely unless your health care provider tells you to do that.  Check your incision area every day for signs of infection. Check for: ? More redness, swelling, or pain. ? More fluid   or blood. ? Warmth. ? Pus or a bad smell. ? Fluid leaking from around your catheter (instead of fluid draining through your catheter). Catheter care   Follow instructions from your health care provider about emptying and cleaning your catheter and collection bag. You may need to clean the catheter every day so it does not clog.  If directed, write down the following information every time you empty your  bag: ? The date and time. ? The amount of drainage. General instructions  Rest at home for 1-2 days after your procedure. Return to your normal activities as told by your health care provider.  Do not take baths, swim, or use a hot tub for 24 hours after your procedure, or until your health care provider says that this is okay.  Take over-the-counter and prescription medicines only as told by your health care provider.  Keep all follow-up visits as told by your health care provider. This is important. Contact a health care provider if:  You have less than 10 mL of drainage a day for 2-3 days in a row, or as directed by your health care provider.  You have more redness, swelling, or pain around your incision area.  You have more fluid or blood coming from your incision area.  Your incision area feels warm to the touch.  You have pus or a bad smell coming from your incision area.  You have fluid leaking from around your catheter (instead of through your catheter).  You have a fever or chills.  You have pain that does not get better with medicine. Get help right away if:  Your catheter comes out.  You suddenly stop having drainage from your catheter.  You suddenly have blood in the fluid that is draining from your catheter.  You become dizzy or you faint.  You develop a rash.  You have nausea or vomiting.  You have difficulty breathing or you feel short of breath.  You develop chest pain.  You have problems with your speech or vision.  You have trouble balancing or moving your arms or legs. Summary  It is common to have a small amount of bruising and discomfort in the area where the drainage tube (catheter) was placed.  You may be directed to record the amount of drainage from the bag every time you empty it.  Follow instructions from your health care provider about emptying and cleaning your catheter and collection bag. This information is not intended to  replace advice given to you by your health care provider. Make sure you discuss any questions you have with your health care provider. Document Revised: 03/02/2017 Document Reviewed: 02/10/2016 Elsevier Patient Education  2020 [employer]  

## 2019-11-10 NOTE — Plan of Care (Signed)
  Problem: Education: Goal: Knowledge of General Education information will improve Description: Including pain rating scale, medication(s)/side effects and non-pharmacologic comfort measures Outcome: Adequate for Discharge   

## 2019-11-10 NOTE — Plan of Care (Signed)
  Problem: Clinical Measurements: Goal: Ability to maintain clinical measurements within normal limits will improve Outcome: Progressing   Problem: Clinical Measurements: Goal: Will remain free from infection Outcome: Progressing   

## 2019-11-10 NOTE — Progress Notes (Signed)
Discharge instructions (including medications) discussed with and copy provided to patient/caregiver 

## 2019-11-10 NOTE — Progress Notes (Signed)
Referring Physician(s): Dr. Chipper Herb   Supervising Physician: Irish Lack  Patient Status:  [name] In-[facility]  Chief Complaint:  Intra abdominal pain s/p pelvic drain x 2 on 8.3.21   Subjective:  54 y.o, female inpatient. History of HTN and diverticulitis. Presented to the ED at [facility] with pain and swelling to her lower abdomen. Patient was found to have leokocytosis with an elevated lactic acid.  CT abd pelvis from 8.1.21 shows from 8.1.21 shows a multispatial pelvic abscess. IR placed a RLQ and LLQ pelvic drain on 8.3.21. Patient alert and laying in bed, calm and comfortable. Denies any fevers, headache, chest pain, SOB, cough, abdominal pain, nausea, vomiting or bleeding. Anticipated discharge today 8.9.21    Allergies: Patient has no known allergies.  Medications: Prior to Admission medications   Medication Sig Start Date End Date Taking? Authorizing Provider  ALPRAZolam Prudy Feeler) 0.5 MG tablet Take 0.5 mg by mouth 3 (three) times daily as needed for anxiety.   Yes [provider]  hyoscyamine (LEVSIN) 0.125 MG tablet Take 0.125 mg by mouth every 6 (six) hours as needed for bladder spasms or cramping.  10/21/19  Yes [provider]  losartan-hydrochlorothiazide (HYZAAR) 50-12.5 MG per tablet Take 1 tablet by mouth daily.   Yes [provider]  promethazine (PHENERGAN) 25 MG tablet Take 25 mg by mouth 2 (two) times daily as needed for nausea or vomiting.  10/21/19  Yes [provider]  ciprofloxacin (CIPRO) 500 MG tablet Take 1 tablet (500 mg total) by mouth 2 (two) times daily. One po bid x 7 days Patient not taking: Reported on 11/02/2019 10/05/12   Doug Sou, MD  ferrous sulfate 325 (65 FE) MG tablet Take 1 tablet (325 mg total) by mouth 2 (two) times daily with a meal. 11/10/19 12/10/19  Hughie Closs, MD  levofloxacin (LEVAQUIN) 500 MG tablet Take 1 tablet (500 mg total) by mouth daily for 25 days. 11/10/19 12/05/19  Meuth, Brooke A, PA-C    metroNIDAZOLE (FLAGYL) 500 MG tablet Take 1 tablet (500 mg total) by mouth 3 (three) times daily. Patient not taking: Reported on 11/02/2019 10/05/12   Doug Sou, MD  metroNIDAZOLE (FLAGYL) 500 MG tablet Take 1 tablet (500 mg total) by mouth 3 (three) times daily for 25 days. 11/10/19 12/05/19  Meuth, Brooke A, PA-C  ondansetron (ZOFRAN) 4 MG tablet Take 1 tablet (4 mg total) by mouth every 6 (six) hours as needed for nausea. 11/10/19   Meuth, Brooke A, PA-C  ondansetron (ZOFRAN) 8 MG tablet Take 1 tablet (8 mg total) by mouth every 8 (eight) hours as needed for nausea. Patient not taking: Reported on 11/02/2019 10/05/12   Doug Sou, MD  oxyCODONE (OXY IR/ROXICODONE) 5 MG immediate release tablet Take 1 tablet (5 mg total) by mouth every 6 (six) hours as needed for moderate pain or severe pain. 11/10/19   Meuth, Lina Sar, PA-C  saccharomyces boulardii (FLORASTOR) 250 MG capsule Take 1 capsule (250 mg total) by mouth 2 (two) times daily. 11/10/19   Meuth, Brooke A, PA-C     Vital Signs: BP (!) 143/87 (BP Location: Left Arm)    Pulse 100    Temp 98.1 F (36.7 C) (Oral)    Resp 20    Ht 5\' 6"  (1.676 m)    Wt 213 lb (96.6 kg)    SpO2 99%    BMI 34.38 kg/m   Physical Exam Vitals and nursing note reviewed.  Constitutional:  Appearance: She is well-developed.  HENT:     Head: Normocephalic and atraumatic.  Eyes:     Conjunctiva/sclera: Conjunctivae normal.  Pulmonary:     Effort: Pulmonary effort is normal.  Abdominal:     Comments: Positive RLQ and LLQ drain  to suction.site is unremarkable with no erythema, edema, tenderness, bleeding or drainage noted at exit site. Suture and stat lock in place. Dressing is clean dry and intact. Approximately 15 ml of  serosanginous colored fluid noted in bulb suction device. Drain is able to be flushed easily.    Musculoskeletal:     Cervical back: Normal range of motion.  Neurological:     Mental Status: She is alert and oriented to person, place, and  time.     Imaging: No results found.  Labs:  CBC: Recent Labs    11/07/19 0527 11/08/19 0028 11/09/19 0553 11/10/19 0332  WBC 10.9* 10.8* 9.9 10.5  HGB 7.7* 8.1* 8.4* 8.1*  HCT 23.0* 24.6* 25.4* 24.0*  PLT 409* 463* 455* 424*    COAGS: Recent Labs    11/04/19 0428  INR 1.4*    BMP: Recent Labs    11/03/19 0352 11/04/19 0428 11/05/19 0433 11/06/19 0439  NA 133* 138 134* 137  K 3.0* 4.4 3.4* 3.6  CL 101 108 104 102  CO2 23 24 24 25   GLUCOSE 107* 100* 106* 101*  BUN 9 10 5* <5*  CALCIUM 7.9* 8.3* 7.9* 8.0*  CREATININE 0.63 0.64 0.65 0.62  GFRNONAA >60 >60 >60 >60  GFRAA >60 >60 >60 >60    LIVER FUNCTION TESTS: Recent Labs    11/02/19 0617 11/04/19 0428  BILITOT 1.7* 0.9  AST 17 14*  ALT 13 13  ALKPHOS 72 59  PROT 7.7 5.5*  ALBUMIN 2.6* 1.7*    Assessment and Plan:  54 y.o, female inpatient. History of HTN and diverticulitis. Presented to the ED at [facility] with pain and swelling to her lower abdomen. Patient was found to have leokocytosis with an elevated lactic acid.  CT abd pelvis from 8.1.21 shows from 8.1.21 shows a multispatial pelvic abscess. IR placed two pelvic drains on 8.3.21. Approximately 15 ml of serosanguinous fluid noted in each JP drain.  per Epic output is:  RLQ - 40 ml, 75 ml  LLQ - 45 ml, 60 ml, 85 ml  No additional imaging since drain placement. All labs and medications are within acceptable parameters. Patient is afebrile.  Recommend team continue with flushing TID, output recording q shift and dressing changes as needed. Would consider additional imaging when output is less than 10 ml for 24 hours not including flush material.   Patient stats that she is pending discharge. Care and education provided to patient at bedside regarding drains. Patient verbalizing understanding and demonstrated proper technique to flush.  Patient to be followed in IR Drain clinic as outpatient.    Electronically Signed: 10.3.21,  NP 11/10/2019, 1:27 PM   I spent a total of 15 Minutes at the the patient's bedside AND on the patient's [facility] floor or unit, greater than 50% of which was counseling/coordinating care for Pelvic drain placement X 2

## 2019-11-10 NOTE — Discharge Summary (Signed)
Physician Discharge Summary  [name] YWV:[id number] DOB: 1965-09-24 DOA: 11/02/2019  PCP: Soundra Pilon, FNP  Admit date: 11/02/2019 Discharge date: 11/10/2019  Admitted From: Home Disposition: Home  Recommendations for Outpatient Follow-up:  1. Follow up with PCP in 1-2 weeks 2. Follow with general surgery on 12/03/2019 as they have a scheduled 3. Please obtain BMP/CBC in one week 4. Please follow up with your PCP on the following pending results: Unresulted Labs (From admission, onward) Comment          Start     Ordered   11/04/19 0500  CBC with Differential/Platelet  Daily,   R      11/03/19 0930   11/04/19 0500  Magnesium  Daily,   R      11/03/19 0930   Unscheduled  Occult blood card to lab, stool  As needed,   R      11/07/19 1151           Home Health: None Equipment/Devices: None  Discharge Condition: Stable CODE STATUS: Full code Diet recommendation: Full liquid diet  Subjective: Seen and examined.  No complaints.  Excited to go home.  Brief/Interim Summary: [name] a 54 y.o.femalewith medical history significant ofhypertension, recurrent diverticulitis, presented with lower abdominal pain and lump. Patient has had multiple episodes of diverticulitis in the past 2 to 3 years and has been managed by her PCP with p.o. antibiotics.Most recent episode with 3 weeks ago,when she was treated with Cipro and Flagyl for about 2 weeks. Hersymptoms wasimprovedfor about one week then, over past 2-3 days, she started tohave cramping like lower abdominal pain, and she started to feel a lump growing in size in the last 2 days with tenderness.  Upon arrival to ED, WBC 18.6, lactic acid 2.2,CT abdominal with contrast:Marked thickening of the walls of the sigmoid colon indicating acute diverticulitis. Multiple abscess collections extending inferior and anterior to the affected segment of the sigmoid colon.  She was admitted under hospitalist service, started on  antibiotics and general surgery consulted. She was also diagnosed with severe sepsis based on tachycardia, leukocytosis and lactic acid of 2.2.  She underwent percutaneous drain placement by IR on 11/04/2019.  Abdominal abscess culture is growing E. coli, Pseudomonas and Streptococcus.  Blood culture remain negative.  She remained on Zosyn for several days that she was in the hospital.  She still has significant drainage from her percutaneous drains.  She is tolerating full liquid diet.  General surgery has recommended to discharge this patient on full liquid diet with the drains and on Levaquin and Flagyl which they have already sent to the pharmacy.  Patient is being discharged in stable condition.  She will follow with general surgery for further recommendations.  Of note, patient also came in with microcytic anemia.  Her hemoglobin remained stable around 8.  Iron studies indicated combination of iron deficiency anemia and anemia of chronic disease.  I am also discharging her on Feosol twice daily.  Discharge Diagnoses:  Principal Problem:   Severe sepsis [facility]) Active Problems:   Diverticulitis   Intra-abdominal abscess [facility])    Discharge Instructions   Allergies as of 11/10/2019   No Known Allergies     Medication List    STOP taking these medications   ciprofloxacin 500 MG tablet Commonly known as: Cipro     TAKE these medications   ALPRAZolam 0.5 MG tablet Commonly known as: XANAX Take 0.5 mg by mouth 3 (three) times daily as needed for anxiety.  ferrous sulfate 325 (65 FE) MG tablet Take 1 tablet (325 mg total) by mouth 2 (two) times daily with a meal.   hyoscyamine 0.125 MG tablet Commonly known as: LEVSIN Take 0.125 mg by mouth every 6 (six) hours as needed for bladder spasms or cramping.   levofloxacin 500 MG tablet Commonly known as: Levaquin Take 1 tablet (500 mg total) by mouth daily for 25 days.   losartan-hydrochlorothiazide 50-12.5 MG tablet Commonly known as:  HYZAAR Take 1 tablet by mouth daily.   metroNIDAZOLE 500 MG tablet Commonly known as: Flagyl Take 1 tablet (500 mg total) by mouth 3 (three) times daily for 25 days.   ondansetron 4 MG tablet Commonly known as: ZOFRAN Take 1 tablet (4 mg total) by mouth every 6 (six) hours as needed for nausea. What changed:   medication strength  how much to take  when to take this   oxyCODONE 5 MG immediate release tablet Commonly known as: Oxy IR/ROXICODONE Take 1 tablet (5 mg total) by mouth every 6 (six) hours as needed for moderate pain or severe pain.   promethazine 25 MG tablet Commonly known as: PHENERGAN Take 25 mg by mouth 2 (two) times daily as needed for nausea or vomiting.   saccharomyces boulardii 250 MG capsule Commonly known as: FLORASTOR Take 1 capsule (250 mg total) by mouth 2 (two) times daily.       Follow-up Information    [facility] Follow up.   Why: Please follow up with [facility] 10-14 days after you are discharged from the hospital. A scheduler from our office will call you to set this appointment up. Please call [facility] if you have any questions prior to your appointment.  Contact information: [address] [place] [address] [phone]        Andria Meuse, MD. Go on 12/03/2019.   Specialty: General Surgery Why: Your appointment is 9/1 at 11:30am Please arrive 30 minutes prior to your appointment to check in and fill out paperwork. Bring photo ID and insurance information. Contact information: [address] [place] [address] [phone]        Soundra Pilon, FNP Follow up in 1 week(s).   Specialty: Family Medicine Contact information: [address] [place] [address] [phone]              No Known Allergies  Consultations: General surgery and IR   Procedures/Studies: CT ABDOMEN PELVIS W CONTRAST  Result Date: 11/02/2019 CLINICAL DATA:  Abdominal pain.  EXAM: CT ABDOMEN AND PELVIS WITH CONTRAST TECHNIQUE: Multidetector CT imaging of the abdomen and pelvis was performed using the standard protocol following bolus administration of intravenous contrast. CONTRAST:  OMNIPAQUE IOHEXOL 300 MG/ML  SOLN COMPARISON:  CT abdomen dated 10/05/2012 FINDINGS: Lower chest: No acute abnormality. Hepatobiliary: No focal liver abnormality is seen. No gallstones, gallbladder wall thickening, or biliary dilatation. Pancreas: Unremarkable. No pancreatic ductal dilatation or surrounding inflammatory changes. Spleen: Normal in size without focal abnormality. Adrenals/Urinary Tract: Adrenal glands appear normal. Kidneys appear normal without mass, stone or hydronephrosis. No obstructing ureteral or bladder calculi are identified. Stomach/Bowel: Marked thickening of the walls of the sigmoid colon indicating acute diverticulitis. Multiple abscess collections extending inferior and anterior to the affected segment of the sigmoid colon. These abscess collections are potentially multiloculated but the major components appear to be contiguous from just above the level of the bladder to the anterior abdominal wall, overall measuring approximately 12 cm AP  extent (series 3, image 70). The component of the abscess collection within the anterior abdominal wall measures 7.2 x 3.2 x 5.8 cm (craniocaudal by AP by transverse dimensions) (series 3, image 71; coronal series 6, image 18). The most inferior component of the abscess collection abuts the dome of the bladder, and there is thickening of the underlying bladder wall, suggesting a developing colovesical fistula (axial series 3, images 70 through 75). Extensive diverticulosis throughout the colon, but no other site of acute/subacute diverticulitis. No bowel obstruction. Vascular/Lymphatic: No significant vascular findings are present. No enlarged abdominal or pelvic lymph nodes. Reproductive: Presumed hysterectomy.  No adnexal mass or free  fluid. Other: No free intraperitoneal air identified. Musculoskeletal: No acute or suspicious osseous finding. IMPRESSION: 1. Acute or subacute diverticulitis of the sigmoid colon. 2. Multiple associated abscess collections extending inferior and anterior to the affected segment of the sigmoid colon. These abscess collections are potentially multiloculated but the major components appear to be contiguous from just above the level of the bladder to the anterior abdominal wall, overall measuring approximately 12 cm AP extent. The component of the abscess collection within the anterior abdominal wall measures 7.2 x 3.2 x 5.8 cm. The most inferior component of the abscess collection abuts the dome of the bladder, and there is thickening of the underlying bladder wall, suggesting a developing colovesical fistula. Given the distortion of the sigmoid colon loops/walls, I suspect colocolic fistulas as well. 3. Extensive diverticulosis. 4. No free intraperitoneal air identified. Electronically Signed   By: Bary Richard M.D.   On: 11/02/2019 14:21   CT IMAGE GUIDED DRAINAGE BY PERCUTANEOUS CATHETER  Result Date: 11/04/2019 INDICATION: 54 year old female with a history of pelvic abscess, trans spatial EXAM: CT GUIDED DRAINAGE OF  ABSCESS MEDICATIONS: The patient is currently admitted to the hospital and receiving intravenous antibiotics. The antibiotics were administered within an appropriate time frame prior to the initiation of the procedure. ANESTHESIA/SEDATION: 1.0 mg IV Versed 50 mcg IV Fentanyl Moderate Sedation Time:  21 minutes The patient was continuously monitored during the procedure by the interventional radiology nurse under my direct supervision. COMPLICATIONS: None TECHNIQUE: Informed written consent was obtained from the patient after a thorough discussion of the procedural risks, benefits and alternatives. All questions were addressed. Maximal Sterile Barrier Technique was utilized including caps, mask,  sterile gowns, sterile gloves, sterile drape, hand hygiene and skin antiseptic. A timeout was performed prior to the initiation of the procedure. PROCEDURE: The operative field was prepped with Chlorhexidine in a sterile fashion, and a sterile drape was applied covering the operative field. A sterile gown and sterile gloves were used for the procedure. Local anesthesia was provided with 1% Lidocaine. Once the patient is prepped and draped in the usual sterile fashion, 1% lidocaine was used for local anesthesia. Two parallel 18 gauge trocar needles were advanced with CT guidance into the deep abscess and the superficial component of the abscess. Using modified Seldinger technique, a 12 French drain was placed into the deep abscess, and separately into the superficial abscess. Approximately 60 cc of purulent material aspirated from the deep abscess with a culture sent. Both catheters were sutured in position and attached to bulb suction drainage. Final CT was acquired. Patient tolerated the procedure well and remained hemodynamically stable throughout. No complications were encountered and no significant blood loss. FINDINGS: CT demonstrates a multi spatial abscess within the pelvis, similar to the comparison CT. After drainage placement, there is a 70 [place] drain from anterior midline approach to  the deep component, and a tangential right of midline 12 French drain into the superficial component. Both are attached to bulb drainage. IMPRESSION: Status post CT-guided drainage with placement of 2 drainage catheters into a multi spatial abscess of the pelvis. Signed, Yvone Neu. Reyne Dumas, RPVI [facility] Electronically Signed   By: Gilmer Mor D.O.   On: 11/04/2019 10:03   CT IMAGE GUIDED DRAINAGE BY PERCUTANEOUS CATHETER  Result Date: 11/04/2019 INDICATION: 54 year old female with a history of pelvic abscess, trans spatial EXAM: CT GUIDED DRAINAGE OF   ABSCESS MEDICATIONS: The patient is currently admitted to the hospital and receiving intravenous antibiotics. The antibiotics were administered within an appropriate time frame prior to the initiation of the procedure. ANESTHESIA/SEDATION: 1.0 mg IV Versed 50 mcg IV Fentanyl Moderate Sedation Time:  21 minutes The patient was continuously monitored during the procedure by the interventional radiology nurse under my direct supervision. COMPLICATIONS: None TECHNIQUE: Informed written consent was obtained from the patient after a thorough discussion of the procedural risks, benefits and alternatives. All questions were addressed. Maximal Sterile Barrier Technique was utilized including caps, mask, sterile gowns, sterile gloves, sterile drape, hand hygiene and skin antiseptic. A timeout was performed prior to the initiation of the procedure. PROCEDURE: The operative field was prepped with Chlorhexidine in a sterile fashion, and a sterile drape was applied covering the operative field. A sterile gown and sterile gloves were used for the procedure. Local anesthesia was provided with 1% Lidocaine. Once the patient is prepped and draped in the usual sterile fashion, 1% lidocaine was used for local anesthesia. Two parallel 18 gauge trocar needles were advanced with CT guidance into the deep abscess and the superficial component of the abscess. Using modified Seldinger technique, a 12 French drain was placed into the deep abscess, and separately into the superficial abscess. Approximately 60 cc of purulent material aspirated from the deep abscess with a culture sent. Both catheters were sutured in position and attached to bulb suction drainage. Final CT was acquired. Patient tolerated the procedure well and remained hemodynamically stable throughout. No complications were encountered and no significant blood loss. FINDINGS: CT demonstrates a multi spatial abscess within the pelvis, similar to the comparison CT. After drainage  placement, there is a 82 [place] drain from anterior midline approach to the deep component, and a tangential right of midline 12 French drain into the superficial component. Both are attached to bulb drainage. IMPRESSION: Status post CT-guided drainage with placement of 2 drainage catheters into a multi spatial abscess of the pelvis. Signed, Yvone Neu. Reyne Dumas, RPVI [facility] Electronically Signed   By: Gilmer Mor D.O.   On: 11/04/2019 10:03     Discharge Exam: Vitals:   11/09/19 2033 11/10/19 0335  BP: 129/83 (!) 143/87  Pulse: 83 100  Resp: 18 20  Temp: 97.9 F (36.6 C) 98.1 F (36.7 C)  SpO2: 99% 99%   Vitals:   11/09/19 1207 11/09/19 1642 11/09/19 2033 11/10/19 0335  BP: (!) 123/91 127/89 129/83 (!) 143/87  Pulse: 86 99 83 100  Resp: 16 16 18 20   Temp: 98.5 F (36.9 C)  97.9 F (36.6 C) 98.1 F (36.7 C)  TempSrc: Oral  Oral Oral  SpO2: 100% 100% 99% 99%  Weight:    96.6 kg  Height:        General: Pt is alert, awake, not in acute distress Cardiovascular: RRR, S1/S2 +, no rubs, no gallops  Respiratory: CTA bilaterally, no wheezing, no rhonchi Abdominal: Soft, NT, ND, bowel sounds +, 2 subcutaneous drains in place in the lower abdomen. Extremities: no edema, no cyanosis    The results of significant diagnostics from this hospitalization (including imaging, microbiology, ancillary and laboratory) are listed below for reference.     Microbiology: Recent Results (from the past 240 hour(s))  SARS Coronavirus 2 by RT PCR (hospital order, performed in [facility] hospital lab) Nasopharyngeal Nasopharyngeal Swab     Status: None   Collection Time: 11/02/19  3:59 PM   Specimen: Nasopharyngeal Swab  Result Value Ref Range Status   SARS Coronavirus 2 NEGATIVE NEGATIVE Final    Comment: (NOTE) SARS-CoV-2 target nucleic acids are NOT DETECTED.  The SARS-CoV-2 RNA is generally detectable in upper and  lower respiratory specimens during the acute phase of infection. The lowest concentration of SARS-CoV-2 viral copies this assay can detect is 250 copies / mL. A negative result does not preclude SARS-CoV-2 infection and should not be used as the sole basis for treatment or other patient management decisions.  A negative result may occur with improper specimen collection / handling, submission of specimen other than nasopharyngeal swab, presence of viral mutation(s) within the areas targeted by this assay, and inadequate number of viral copies (<250 copies / mL). A negative result must be combined with clinical observations, patient history, and epidemiological information.  Fact Sheet for Patients:   BoilerBrush.com.cy  Fact Sheet for Healthcare Providers: https://pope.com/  This test is not yet approved or  cleared by the [place] FDA and has been authorized for detection and/or diagnosis of SARS-CoV-2 by FDA under an Emergency Use Authorization (EUA).  This EUA will remain in effect (meaning this test can be used) for the duration of the COVID-19 declaration under Section 564(b)(1) of the Act, 21 U.S.C. section 360bbb-3(b)(1), unless the authorization is terminated or revoked sooner.  Performed at [facility], [address], [place], [place] [address]   Culture, blood (routine x 2)     Status: None   Collection Time: 11/03/19  3:52 AM   Specimen: BLOOD  Result Value Ref Range Status   Specimen Description BLOOD  Final   Special Requests   Final    LEFT CEPHELIC BOTTLES DRAWN AEROBIC AND ANAEROBIC Blood Culture adequate volume   Culture   Final    NO GROWTH 5 DAYS Performed at [facility], [address], [place], [place] [address]    Report Status 11/08/2019 FINAL  Final  Culture, blood (routine x 2)     Status: None   Collection Time: 11/03/19  4:02 AM   Specimen: BLOOD  Result Value Ref Range Status    Specimen Description BLOOD LEFT ANTECUBITAL  Final   Special Requests   Final    BOTTLES DRAWN AEROBIC AND ANAEROBIC Blood Culture results may not be optimal due to an excessive volume of blood received in culture bottles   Culture   Final    NO GROWTH 5 DAYS Performed at [facility], [address], [place], [place] [address]    Report Status 11/08/2019 FINAL  Final  Aerobic/Anaerobic Culture (surgical/deep wound)     Status: None (Preliminary result)   Collection Time: 11/04/19  9:53 AM   Specimen: Abscess  Result Value Ref Range Status   Specimen Description ABSCESS  Final   Special Requests ABDOMEN  Final   Gram Stain   Final    ABUNDANT WBC PRESENT, PREDOMINANTLY  PMN ABUNDANT GRAM POSITIVE COCCI IN PAIRS MODERATE GRAM VARIABLE ROD Performed at [facility], [address] [place], [place] [address]    Culture   Final    MODERATE ESCHERICHIA COLI MODERATE PSEUDOMONAS AERUGINOSA ABUNDANT STREPTOCOCCUS GROUP C SUSCEPTIBILITIES TO FOLLOW MIXED ANAEROBIC FLORA PRESENT.  CALL LAB IF FURTHER IID REQUIRED.    Report Status PENDING  Incomplete     Labs: BNP (last 3 results) No results for input(s): BNP in the last 8760 hours. Basic Metabolic Panel: Recent Labs  Lab 11/04/19 0428 11/04/19 0428 11/05/19 6045 11/05/19 0433 11/06/19 0439 11/07/19 0527 11/08/19 0028 11/09/19 0553 11/10/19 0332  NA 138  --  134*  --  137  --   --   --   --   K 4.4  --  3.4*  --  3.6  --   --   --   --   CL 108  --  104  --  102  --   --   --   --   CO2 24  --  24  --  25  --   --   --   --   GLUCOSE 100*  --  106*  --  101*  --   --   --   --   BUN 10  --  5*  --  <5*  --   --   --   --   CREATININE 0.64  --  0.65  --  0.62  --   --   --   --   CALCIUM 8.3*  --  7.9*  --  8.0*  --   --   --   --   MG 1.8   < > 1.5*   < > 1.8 1.9 1.8 1.9 2.0   < > = values in this interval not displayed.   Liver Function Tests: Recent Labs  Lab 11/04/19 0428  AST 14*  ALT 13  ALKPHOS  59  BILITOT 0.9  PROT 5.5*  ALBUMIN 1.7*   No results for input(s): LIPASE, AMYLASE in the last 168 hours. No results for input(s): AMMONIA in the last 168 hours. CBC: Recent Labs  Lab 11/06/19 0439 11/07/19 0527 11/08/19 0028 11/09/19 0553 11/10/19 0332  WBC 14.0* 10.9* 10.8* 9.9 10.5  NEUTROABS 9.8* 6.9 6.0 6.8 5.2  HGB 7.8* 7.7* 8.1* 8.4* 8.1*  HCT 22.5* 23.0* 24.6* 25.4* 24.0*  MCV 68.6* 68.9* 70.1* 69.4* 70.2*  PLT 389 409* 463* 455* 424*   Cardiac Enzymes: No results for input(s): CKTOTAL, CKMB, CKMBINDEX, TROPONINI in the last 168 hours. BNP: Invalid input(s): POCBNP CBG: No results for input(s): GLUCAP in the last 168 hours. D-Dimer No results for input(s): DDIMER in the last 72 hours. Hgb A1c No results for input(s): HGBA1C in the last 72 hours. Lipid Profile No results for input(s): CHOL, HDL, LDLCALC, TRIG, CHOLHDL, LDLDIRECT in the last 72 hours. Thyroid function studies No results for input(s): TSH, T4TOTAL, T3FREE, THYROIDAB in the last 72 hours.  Invalid input(s): FREET3 Anemia work up Recent Labs    11/07/19 1204  VITAMINB12 1,181*  FOLATE 10.2  FERRITIN 529*  TIBC 132*  IRON 57   Urinalysis    Component Value Date/Time   COLORURINE AMBER (A) 11/02/2019 0630   APPEARANCEUR HAZY (A) 11/02/2019 0630   LABSPEC 1.021 11/02/2019 0630   PHURINE 5.0 11/02/2019 0630   GLUCOSEU NEGATIVE 11/02/2019 0630   HGBUR SMALL (A) 11/02/2019 0630   BILIRUBINUR  NEGATIVE 11/02/2019 0630   KETONESUR 80 (A) 11/02/2019 0630   PROTEINUR 100 (A) 11/02/2019 0630   UROBILINOGEN 0.2 10/05/2012 1010   NITRITE NEGATIVE 11/02/2019 0630   LEUKOCYTESUR NEGATIVE 11/02/2019 0630   Sepsis Labs Invalid input(s): PROCALCITONIN,  WBC,  LACTICIDVEN Microbiology Recent Results (from the past 240 hour(s))  SARS Coronavirus 2 by RT PCR (hospital order, performed in [facility] hospital lab) Nasopharyngeal Nasopharyngeal Swab     Status: None   Collection Time: 11/02/19  3:59  PM   Specimen: Nasopharyngeal Swab  Result Value Ref Range Status   SARS Coronavirus 2 NEGATIVE NEGATIVE Final    Comment: (NOTE) SARS-CoV-2 target nucleic acids are NOT DETECTED.  The SARS-CoV-2 RNA is generally detectable in upper and lower respiratory specimens during the acute phase of infection. The lowest concentration of SARS-CoV-2 viral copies this assay can detect is 250 copies / mL. A negative result does not preclude SARS-CoV-2 infection and should not be used as the sole basis for treatment or other patient management decisions.  A negative result may occur with improper specimen collection / handling, submission of specimen other than nasopharyngeal swab, presence of viral mutation(s) within the areas targeted by this assay, and inadequate number of viral copies (<250 copies / mL). A negative result must be combined with clinical observations, patient history, and epidemiological information.  Fact Sheet for Patients:   BoilerBrush.com.cy  Fact Sheet for Healthcare Providers: https://pope.com/  This test is not yet approved or  cleared by the [place] FDA and has been authorized for detection and/or diagnosis of SARS-CoV-2 by FDA under an Emergency Use Authorization (EUA).  This EUA will remain in effect (meaning this test can be used) for the duration of the COVID-19 declaration under Section 564(b)(1) of the Act, 21 U.S.C. section 360bbb-3(b)(1), unless the authorization is terminated or revoked sooner.  Performed at [facility], [address] [place], [place] [address]   Culture, blood (routine x 2)     Status: None   Collection Time: 11/03/19  3:52 AM   Specimen: BLOOD  Result Value Ref Range Status   Specimen Description BLOOD  Final   Special Requests   Final    LEFT CEPHELIC BOTTLES DRAWN AEROBIC AND ANAEROBIC Blood Culture adequate volume   Culture   Final    NO GROWTH 5 DAYS Performed at  [facility], [address], [place], [place] [address]    Report Status 11/08/2019 FINAL  Final  Culture, blood (routine x 2)     Status: None   Collection Time: 11/03/19  4:02 AM   Specimen: BLOOD  Result Value Ref Range Status   Specimen Description BLOOD LEFT ANTECUBITAL  Final   Special Requests   Final    BOTTLES DRAWN AEROBIC AND ANAEROBIC Blood Culture results may not be optimal due to an excessive volume of blood received in culture bottles   Culture   Final    NO GROWTH 5 DAYS Performed at [facility], [address], [place], [place] [address]    Report Status 11/08/2019 FINAL  Final  Aerobic/Anaerobic Culture (surgical/deep wound)     Status: None (Preliminary result)   Collection Time: 11/04/19  9:53 AM   Specimen: Abscess  Result Value Ref Range Status   Specimen Description ABSCESS  Final   Special Requests ABDOMEN  Final   Gram Stain   Final    ABUNDANT WBC PRESENT, PREDOMINANTLY PMN ABUNDANT GRAM POSITIVE COCCI IN PAIRS MODERATE  GRAM VARIABLE ROD Performed at [facility], [address] [place], [place] [address]    Culture   Final    MODERATE ESCHERICHIA COLI MODERATE PSEUDOMONAS AERUGINOSA ABUNDANT STREPTOCOCCUS GROUP C SUSCEPTIBILITIES TO FOLLOW MIXED ANAEROBIC FLORA PRESENT.  CALL LAB IF FURTHER IID REQUIRED.    Report Status PENDING  Incomplete     Time coordinating discharge: Over 30 minutes  SIGNED:   Hughie Closs, MD  [facility] 11/10/2019, 10:43 AM  If 7PM-7AM, please contact night-coverage [email or web address]

## 2019-11-11 ENCOUNTER — Other Ambulatory Visit: Payer: Self-pay | Admitting: Surgery

## 2019-11-11 DIAGNOSIS — K651 Peritoneal abscess: Secondary | ICD-10-CM

## 2019-11-14 ENCOUNTER — Inpatient Hospital Stay (HOSPITAL_COMMUNITY)
Admission: EM | Admit: 2019-11-14 | Discharge: 2019-11-17 | DRG: 872 | Disposition: A | Discharge status: Home / Self Care | Payer: Commercial Managed Care - PPO | Attending: Internal Medicine | Admitting: Internal Medicine

## 2019-11-14 ENCOUNTER — Emergency Department (HOSPITAL_COMMUNITY): Payer: Commercial Managed Care - PPO

## 2019-11-14 ENCOUNTER — Encounter (HOSPITAL_COMMUNITY): Payer: Self-pay

## 2019-11-14 DIAGNOSIS — K5720 Diverticulitis of large intestine with perforation and abscess without bleeding: Secondary | ICD-10-CM | POA: Diagnosis present

## 2019-11-14 DIAGNOSIS — D638 Anemia in other chronic diseases classified elsewhere: Secondary | ICD-10-CM | POA: Diagnosis not present

## 2019-11-14 DIAGNOSIS — Z6831 Body mass index (BMI) 31.0-31.9, adult: Secondary | ICD-10-CM

## 2019-11-14 DIAGNOSIS — Z90710 Acquired absence of both cervix and uterus: Secondary | ICD-10-CM

## 2019-11-14 DIAGNOSIS — R188 Other ascites: Secondary | ICD-10-CM | POA: Diagnosis present

## 2019-11-14 DIAGNOSIS — Z20822 Contact with and (suspected) exposure to covid-19: Secondary | ICD-10-CM | POA: Diagnosis present

## 2019-11-14 DIAGNOSIS — A419 Sepsis, unspecified organism: Principal | ICD-10-CM | POA: Diagnosis present

## 2019-11-14 DIAGNOSIS — R1030 Lower abdominal pain, unspecified: Secondary | ICD-10-CM | POA: Diagnosis not present

## 2019-11-14 DIAGNOSIS — F419 Anxiety disorder, unspecified: Secondary | ICD-10-CM | POA: Diagnosis present

## 2019-11-14 DIAGNOSIS — D509 Iron deficiency anemia, unspecified: Secondary | ICD-10-CM | POA: Diagnosis present

## 2019-11-14 DIAGNOSIS — I10 Essential (primary) hypertension: Secondary | ICD-10-CM | POA: Diagnosis not present

## 2019-11-14 DIAGNOSIS — R739 Hyperglycemia, unspecified: Secondary | ICD-10-CM | POA: Diagnosis present

## 2019-11-14 DIAGNOSIS — Z886 Allergy status to analgesic agent status: Secondary | ICD-10-CM

## 2019-11-14 DIAGNOSIS — E669 Obesity, unspecified: Secondary | ICD-10-CM | POA: Diagnosis present

## 2019-11-14 DIAGNOSIS — Z79899 Other long term (current) drug therapy: Secondary | ICD-10-CM

## 2019-11-14 HISTORY — DX: Diverticulitis of large intestine with perforation and abscess without bleeding: K57.20

## 2019-11-14 LAB — CBC WITH DIFFERENTIAL/PLATELET
Abs Immature Granulocytes: 0.09 K/uL — ABNORMAL HIGH (ref 0.00–0.07)
Basophils Absolute: 0 K/uL (ref 0.0–0.1)
Basophils Relative: 0 %
Eosinophils Absolute: 0 K/uL (ref 0.0–0.5)
Eosinophils Relative: 0 %
HCT: 30.4 % — ABNORMAL LOW (ref 36.0–46.0)
Hemoglobin: 10 g/dL — ABNORMAL LOW (ref 12.0–15.0)
Immature Granulocytes: 1 %
Lymphocytes Relative: 11 %
Lymphs Abs: 1.8 K/uL (ref 0.7–4.0)
MCH: 23.7 pg — ABNORMAL LOW (ref 26.0–34.0)
MCHC: 32.9 g/dL (ref 30.0–36.0)
MCV: 72 fL — ABNORMAL LOW (ref 80.0–100.0)
Monocytes Absolute: 0.9 K/uL (ref 0.1–1.0)
Monocytes Relative: 5 %
Neutro Abs: 14.6 K/uL — ABNORMAL HIGH (ref 1.7–7.7)
Neutrophils Relative %: 83 %
Platelets: 438 K/uL — ABNORMAL HIGH (ref 150–400)
RBC: 4.22 MIL/uL (ref 3.87–5.11)
RDW: 19.7 % — ABNORMAL HIGH (ref 11.5–15.5)
WBC: 17.5 K/uL — ABNORMAL HIGH (ref 4.0–10.5)
nRBC: 0 % (ref 0.0–0.2)

## 2019-11-14 LAB — COMPREHENSIVE METABOLIC PANEL
ALT: 10 U/L (ref 0–44)
AST: 15 U/L (ref 15–41)
Albumin: 2.5 g/dL — ABNORMAL LOW (ref 3.5–5.0)
Alkaline Phosphatase: 61 U/L (ref 38–126)
Anion gap: 11 (ref 5–15)
BUN: <5 mg/dL — ABNORMAL LOW (ref 6–20)
CO2: 25 mmol/L (ref 22–32)
Calcium: 8.6 mg/dL — ABNORMAL LOW (ref 8.9–10.3)
Chloride: 104 mmol/L (ref 98–111)
Creatinine, Ser: 0.89 mg/dL (ref 0.44–1.00)
GFR calc Af Amer: >60 mL/min (ref >60)
GFR calc non Af Amer: >60 mL/min (ref >60)
Glucose, Bld: 119 mg/dL — ABNORMAL HIGH (ref 70–99)
Potassium: 3.7 mmol/L (ref 3.5–5.1)
Sodium: 140 mmol/L (ref 135–145)
Total Bilirubin: 1 mg/dL (ref 0.3–1.2)
Total Protein: 6.9 g/dL (ref 6.5–8.1)

## 2019-11-14 LAB — LACTIC ACID, PLASMA: Lactic Acid, Venous: 1.2 mmol/L (ref 0.5–1.9)

## 2019-11-14 LAB — I-STAT BETA HCG BLOOD, ED (MC, WL, AP ONLY): I-stat hCG, quantitative: <5 m[IU]/mL (ref <5)

## 2019-11-14 LAB — SARS CORONAVIRUS 2 BY RT PCR (HOSPITAL ORDER, PERFORMED IN ~~LOC~~ HOSPITAL LAB): SARS Coronavirus 2: NEGATIVE

## 2019-11-14 MED ORDER — PIPERACILLIN-TAZOBACTAM 3.375 G IVPB 30 MIN
3.3750 g | Freq: Once | INTRAVENOUS | Status: AC
  Administered 2019-11-14: 3.375 g via INTRAVENOUS
  Filled 2019-11-14: qty 50

## 2019-11-14 MED ORDER — TRAMADOL HCL 50 MG PO TABS
100.0000 mg | ORAL_TABLET | Freq: Four times a day (QID) | ORAL | Status: DC | PRN
  Administered 2019-11-15 – 2019-11-17 (×7): 100 mg via ORAL
  Filled 2019-11-14 (×7): qty 2

## 2019-11-14 MED ORDER — TRAMADOL HCL 50 MG PO TABS
50.0000 mg | ORAL_TABLET | Freq: Four times a day (QID) | ORAL | Status: DC | PRN
  Administered 2019-11-16: 50 mg via ORAL
  Filled 2019-11-14: qty 1

## 2019-11-14 MED ORDER — IOHEXOL 300 MG/ML  SOLN
100.0000 mL | Freq: Once | INTRAMUSCULAR | Status: AC | PRN
  Administered 2019-11-14: 100 mL via INTRAVENOUS

## 2019-11-14 NOTE — ED Triage Notes (Signed)
Pt arrives POV for eval of drain problem w/ intra-abdominal abscess. Pt reports she had drain placed last week and noted drainage on the t sponge this AM and not going into JP drain. Pt denies fevers/chills, tachycardic in triage, but pt reports "every time I come my HR is 140-150 because I'm so nervous". Denies worse than usual pain

## 2019-11-14 NOTE — ED Notes (Signed)
Patient transported to CT 

## 2019-11-14 NOTE — H&P (Addendum)
History and Physical    [name] ZOX:[id number] DOB: [birth year] DOA: 11/14/2019  PCP: Soundra Pilon, FNP  Patient coming from: home   Chief Complaint:  Chief Complaint  Patient presents with  . Abdominal Abscess     HPI:    54 year old female with past medical history of hypertension, diverticulosis and frequent bouts of diverticulitis over at least the past year who presents to [facility] emergency department due to complaints of persisting lower abdominal pain after recent diagnosis of complicated diverticulitis.  Of note, patient was recently hospitalized at [facility] from 8/1-8/9 for complicated diverticulitis with severe sepsis after failing outpatient antibiotic therapy.  Diverticulitis was complicated by multiple abdominal abscesses that ended up growing out E. coli, Pseudomonas and Streptococcus.  Blood cultures remain negative throughout the hospitalization.  Patient received intravenous Zosyn throughout the hospitalization.  Interventional radiology and general surgery were consulted and the decision was made to place drains in to the abscesses in the patient's abdomen.  General surgery recommended going home on oral antibiotic therapy on a full liquid diet with outpatient follow-up.  Patient was eventually discharged home on levofloxacin and Flagyl on 8/9.  Unfortunately, patient reports a delay in starting her oral antibiotic therapy due to her pharmacy not having IV antibiotics in stock.  Patient did not start antibiotic therapy until 8/11.  Over the span of time, patient states that the drainage coming from both sites turned from a cream color to brown and became more and more foul-smelling.  Additionally, patient began to experience drainage around the drain insertion site as well.  Patient continued to experience lower abdominal pain that she describes as mild to moderate intensity, sharp in quality, worse with movement and nonradiating.  Pain continue  to persist for several days.  Patient denies any associated fevers, weakness, changes in appetite or diarrhea.  Patient eventually presented to [facility] emergency department for evaluation.  Upon evaluation in the emergency department patient was found to have a worsening leukocytosis and other SIRS criteria including tachycardia.  CT imaging of the abdomen was performed revealing redemonstration of multiple large abscesses with drainage catheters seemingly in place with additional small amounts of ascites noted anteriorly in the pelvis concerning for additional new abscesses.  One of the abscesses appeared to be developing fistulous connections to the inflamed sigmoid colon.  Case was discussed with Dr.Tsuei with general surgery who recommended admission by the hospitalist group with IR consultation for possible replacement of the drains which he feels are likely clogged.  Patient was initiated on intravenous Zosyn.  The hospitalist group was then called to assess the patient for admission to the hospital.    Review of Systems:  Review of Systems  Gastrointestinal: Positive for abdominal pain.  All other systems reviewed and are negative.     Past Medical History:  Diagnosis Date  . Diverticulitis   . Hypertension     Past Surgical History:  Procedure Laterality Date  . ABDOMINAL HYSTERECTOMY       reports that she has never smoked. She has never used smokeless tobacco. She reports that she does not drink alcohol and does not use drugs.  Allergies  Allergen Reactions  . Aspirin     diarheea , stomach ache.     Family History  Problem Relation Age of Onset  . Cancer Neg Hx      Prior to Admission medications   Medication Sig Start Date End Date Taking? Authorizing Provider  ALPRAZolam (  XANAX) 0.5 MG tablet Take 0.5 mg by mouth 3 (three) times daily as needed for anxiety.   Yes [provider]  ferrous sulfate 325 (65 FE) MG tablet Take 1 tablet (325 mg  total) by mouth 2 (two) times daily with a meal. 11/10/19 12/10/19 Yes Pahwani, Daleen Bo, MD  hyoscyamine (LEVSIN) 0.125 MG tablet Take 0.125 mg by mouth every 6 (six) hours as needed for bladder spasms or cramping.  10/21/19  Yes [provider]  levofloxacin (LEVAQUIN) 500 MG tablet Take 1 tablet (500 mg total) by mouth daily for 25 days. 11/10/19 12/05/19 Yes Meuth, Lina Sar, PA-C  losartan-hydrochlorothiazide (HYZAAR) 50-12.5 MG per tablet Take 1 tablet by mouth daily.   Yes [provider]  metroNIDAZOLE (FLAGYL) 500 MG tablet Take 1 tablet (500 mg total) by mouth 3 (three) times daily for 25 days. 11/10/19 12/05/19 Yes Meuth, Lina Sar, PA-C  promethazine (PHENERGAN) 25 MG tablet Take 25 mg by mouth 2 (two) times daily as needed for nausea or vomiting.  10/21/19  Yes [provider]  ondansetron (ZOFRAN) 4 MG tablet Take 1 tablet (4 mg total) by mouth every 6 (six) hours as needed for nausea. Patient not taking: Reported on 11/14/2019 11/10/19   Carlena Bjornstad A, PA-C  oxyCODONE (OXY IR/ROXICODONE) 5 MG immediate release tablet Take 1 tablet (5 mg total) by mouth every 6 (six) hours as needed for moderate pain or severe pain. 11/10/19   Meuth, Lina Sar, PA-C  saccharomyces boulardii (FLORASTOR) 250 MG capsule Take 1 capsule (250 mg total) by mouth 2 (two) times daily. 11/10/19   Franne Forts, PA-C    Physical Exam: Vitals:   11/14/19 1307 11/14/19 1715 11/14/19 2146 11/14/19 2245  BP: 120/87 120/80 93/83 115/74  Pulse: (!) 118 (!) 128 (!) 116 (!) 104  Resp: (!) 21  Temp: 99.8 F (37.7 C)     TempSrc: Oral     SpO2: 100% 100% 100% 100%  Weight:      Height:        Constitutional: Acute alert and oriented x3, no associated distress.   Skin: no rashes, no lesions, somewhat poor skin turgor noted Eyes: Pupils are equally reactive to light.  No evidence of scleral icterus or conjunctival pallor.  ENMT: Moist mucous membranes noted.  Posterior pharynx clear of any exudate or  lesions.   Neck: normal, supple, no masses, no thyromegaly.  No evidence of jugular venous distension.   Respiratory: clear to auscultation bilaterally, no wheezing, no crackles. Normal respiratory effort. No accessory muscle use.  Cardiovascular: Regular rate and rhythm, no murmurs / rubs / gallops. No extremity edema. 2+ pedal pulses. No carotid bruits.  Chest:   Nontender without crepitus or deformity.   Back:   Nontender without crepitus or deformity. Abdomen: Notable significant abdominal fullness with 2 percutaneous drains noted to be in place draining feculent foul-smelling material.  Notably, there is additional drainage coming from around the insertion sites.  Lower abdomen is notably tender and somewhat firm concerning for lower abdominal masses.  Positive bowel sounds noted in all quadrants.   Musculoskeletal: No joint deformity upper and lower extremities. Good ROM, no contractures. Normal muscle tone.  Neurologic: CN 2-12 grossly intact. Sensation intact, strength noted to be 5 out of 5 in all 4 extremities.  Patient is following all commands.  Patient is responsive to verbal stimuli.   Psychiatric: Patient presents as a normal mood with appropriate affect.  Patient seems to possess  insight as to theircurrent situation.     Labs on Admission: I have personally reviewed following labs and imaging studies -   CBC: Recent Labs  Lab 11/08/19 0028 11/09/19 0553 11/10/19 0332 11/14/19 1116  WBC 10.8* 9.9 10.5 17.5*  NEUTROABS 6.0 6.8 5.2 14.6*  HGB 8.1* 8.4* 8.1* 10.0*  HCT 24.6* 25.4* 24.0* 30.4*  MCV 70.1* 69.4* 70.2* 72.0*  PLT 463* 455* 424* 438*   Basic Metabolic Panel: Recent Labs  Lab 11/08/19 0028 11/09/19 0553 11/10/19 0332 11/14/19 1116  NA  --   --   --  140  K  --   --   --  3.7  CL  --   --   --  104  CO2  --   --   --  25  GLUCOSE  --   --   --  119*  BUN  --   --   --  <5*  CREATININE  --   --   --  0.89  CALCIUM  --   --   --  8.6*  MG 1.8 1.9 2.0  --     GFR: Estimated Creatinine Clearance: 83.7 mL/min (by C-G formula based on SCr of 0.89 mg/dL). Liver Function Tests: Recent Labs  Lab 11/14/19 1116  AST 15  ALT 10  ALKPHOS 61  BILITOT 1.0  PROT 6.9  ALBUMIN 2.5*   No results for input(s): LIPASE, AMYLASE in the last 168 hours. No results for input(s): AMMONIA in the last 168 hours. Coagulation Profile: No results for input(s): INR, PROTIME in the last 168 hours. Cardiac Enzymes: No results for input(s): CKTOTAL, CKMB, CKMBINDEX, TROPONINI in the last 168 hours. BNP (last 3 results) No results for input(s): PROBNP in the last 8760 hours. HbA1C: No results for input(s): HGBA1C in the last 72 hours. CBG: No results for input(s): GLUCAP in the last 168 hours. Lipid Profile: No results for input(s): CHOL, HDL, LDLCALC, TRIG, CHOLHDL, LDLDIRECT in the last 72 hours. Thyroid Function Tests: No results for input(s): TSH, T4TOTAL, FREET4, T3FREE, THYROIDAB in the last 72 hours. Anemia Panel: No results for input(s): VITAMINB12, FOLATE, FERRITIN, TIBC, IRON, RETICCTPCT in the last 72 hours. Urine analysis:    Component Value Date/Time   COLORURINE AMBER (A) 11/02/2019 0630   APPEARANCEUR HAZY (A) 11/02/2019 0630   LABSPEC 1.021 11/02/2019 0630   PHURINE 5.0 11/02/2019 0630   GLUCOSEU NEGATIVE 11/02/2019 0630   HGBUR SMALL (A) 11/02/2019 0630   BILIRUBINUR NEGATIVE 11/02/2019 0630   KETONESUR 80 (A) 11/02/2019 0630   PROTEINUR 100 (A) 11/02/2019 0630   UROBILINOGEN 0.2 10/05/2012 1010   NITRITE NEGATIVE 11/02/2019 0630   LEUKOCYTESUR NEGATIVE 11/02/2019 0630    Radiological Exams on Admission - Personally Reviewed: CT ABDOMEN PELVIS W CONTRAST  Result Date: 11/14/2019 CLINICAL DATA:  Intra-abdominal abscess. JP drain not draining well, accumulation of drainage on the T-drain sponge. EXAM: CT ABDOMEN AND PELVIS WITH CONTRAST TECHNIQUE: Multidetector CT imaging of the abdomen and pelvis was performed using the standard  protocol following bolus administration of intravenous contrast. CONTRAST:  OMNIPAQUE IOHEXOL 300 MG/ML  SOLN COMPARISON:  Multiple exams, including 11/02/2019 FINDINGS: Lower chest: Unremarkable Hepatobiliary: Heterogeneity in the gallbladder could reflect gallstones or sludge. 0.7 by 0.4 cm hypodense lesion in the lateral segment left hepatic lobe on image 12/2 is technically too small to characterize although statistically likely to be a benign cyst or similar benign lesion. No significant biliary dilatation. Pancreas: Unremarkable Spleen: Unremarkable Adrenals/Urinary Tract:  The kidneys and ureters appear unremarkable. There is an abscess along the upper margin of the urinary bladder as noted below. Stomach/Bowel: There is considerable descending and sigmoid colon diverticulosis. There is an apparent fistulous connection between the sigmoid colon and the abscess collection above the urinary bladder shown on image 95/5. There is also local inflammation in this vicinity such that active diverticulitis may be persisting. Likewise, cannot exclude a second fistulous connection between the proximal sigmoid colon and the abscess collection above the urinary bladder eccentric to the left on image 106/5. There are 2 dominant abscesses in this vicinity. The lesion just above the urinary bladder measures proximally 7.6 by 3.4 by 3.7 cm (volume = 50 cm^3), within this abscess there is coiled a pigtail drain is coiled. A second lesion is present in the subcutaneous tissues just anterior to the linea alba, and this second abscess also contains a pigtail catheter and is traversed by the first pigtail catheter is well. This second abscess measures 7.6 by 4.2 by 5.8 cm (volume = 97 cm^3) and contains fluid and gas. There is surrounding inflammatory phlegmon in the subcutaneous adipose tissues. Small amount of anterior pelvic ascites noted bilaterally, but without obvious enhancing margins to suggest that these represent  additional abscesses. No discontinuity or obvious kinking of the intra-portions the drainage catheters identified. Vascular/Lymphatic: Unremarkable Reproductive: Uterus absent. Other: No supplemental non-categorized findings. Musculoskeletal: Multilevel facet arthropathy in the lumbar spine, with suspected associated right foraminal impingement at L1-2 and L4-5, and left foraminal impingement at L1-2, L3-4, likely L4-5. IMPRESSION: 1. A 50 cubic cm abscess just above the urinary bladder has at least 1 and possibly 2 fistulous connections to the inflamed sigmoid colon. Descending and sigmoid colon diverticulosis. A pigtail catheter is present with loop formed within this abscess. 2. A 97 cubic cm abscess is present in the midline subcutaneous tissues of the anterior pelvis, with surrounding inflammatory stranding in the subcutaneous tissues indicating phlegmon. A pigtail catheter is present with loop formed within this abscess. The other pigtail catheter traverses this abscess to reach the abscess above the urinary bladder. 3. Small amounts of ascites anteriorly in the pelvis, without marginal enhancement to suggest that these collections represent new abscesses. 4. Heterogeneity in the gallbladder could reflect gallstones or sludge. 5. Multilevel facet arthropathy in the lumbar spine with associated foraminal impingement. Electronically Signed   By: Gaylyn Rong M.D.   On: 11/14/2019 20:56    EKG: Personally reviewed.  Rhythm is sinus tachycardia with heart rate of 133 bpm.  No dynamic ST segment changes appreciated.  Assessment/Plan Principal Problem:   Diverticulitis of large intestine with abscess   Patient presenting with extremely complicated diverticulitis of the sigmoid colon with development of multiple large abscesses with concern for clinical worsening since her recent discharge.    Additionally, there is concern for developing fistulous to the sigmoid colon which would likely explain the  feculent appearing material and foul smell on examination.  This interval worsening is in part likely secondary to the significant delay in initiating oral antibiotic therapy in the outpatient setting.  While both catheters seem to be inserted in each of the large abscess sites, general surgery (Dr. Corliss Skains) feels that they are likely clogged and is requesting that IR be reconsulted for replacement of these catheters/drains.  Placing patient on intravenous Zosyn for now.  Hydrating patient with intravenous isotonic fluids  Patient is exhibiting multiple SIRS criteria but does not reveal any evidence of organ dysfunction to suggest underlying  sepsis at this time.  Blood cultures and drainage cultures have been ordered.  Of note, previous drainage culture on 8/3 grew E. coli, Pseudomonas Aeruginosa and Streptococcus Constellatus  General surgery stated that they will continue to follow, the recommendations are appreciated.    As needed analgesics for associated abdominal pain  Active Problems:   Essential hypertension    Continue home regimen of antihypertensive therapy   Anemia of chronic disease   Recent iron panel suggestive of anemia of chronic disease, discharging provider during last hospitalization feels there is likely some superimposed iron deficiency as well.  We will continue previously prescribed regimen of oral iron supplementation.  Monitoring hemoglobin and hematocrit with serial CBCs  Code Status:  Full code Family Communication: deferred   Status is: Observation  The patient remains OBS appropriate and will d/c before 2 midnights.  Dispo: The patient is from: Home              Anticipated d/c is to: Home              Anticipated d/c date is: 2 days              Patient currently is not medically stable to d/c.        Marinda Elk MD [facility] Pager [phone]  If 7PM-7AM, please contact night-coverage [email or web address] Use universal  [place] password for that web site. If you do not have the password, please call the hospital operator.  11/14/2019, 11:37 PM

## 2019-11-14 NOTE — Progress Notes (Signed)
Patient ID: [name], female   DOB: May 02, 1965, 54 y.o.   MRN: [id number]   This patient was recently hospitalized at [facility] with diverticular abscesses s/p two percutaneous drains.  She is now at the [facility] ED with elevated WBC and drainage around one of the drain sites.  The abscesses appear to have drains in place on CT scan, but they may be clogged.  Would consult IR to reevaluate the drains.  They may need to be replaced.  Surgery will follow-up in AM.  Wilmon Arms. Corliss Skains, MD, [facility]  General/ Trauma Surgery   11/14/2019 10:36 PM

## 2019-11-14 NOTE — ED Provider Notes (Signed)
[facility] EMERGENCY DEPARTMENT Provider Note   CSN: [id number] Arrival date & time: 11/14/19  1045     History Chief Complaint  Patient presents with  . Abdominal Abscess    [name] is a 54 y.o. female who presents to the emergency department for evaluation of percutaneous drains.  She is status post percutaneous drain placement with pigtail catheters in the abdomen for abscess formation secondary to diverticulitis in the abdomen.  The patient was had severe sepsis and admitted to the hospital.  She was on IV antibiotics and had the drains placed and was discharged on 11/10/2019.  Patient states that since she has been home she has noticed increased and more foul-smelling drainage around the drain sites which brought her in.  She notes that there seems to be more copious discharge and she has increasing pain in the region.  Patient noted to be tachycardic which she states "I always get a high heart rate in the emergency room because I get nervous."  She denies fever or chills.  HPI     Past Medical History:  Diagnosis Date  . Diverticulitis   . Hypertension     Patient Active Problem List   Diagnosis Date Noted  . Diverticulitis of large intestine with abscess 11/14/2019  . Essential hypertension 11/14/2019  . Anemia, chronic disease 11/14/2019  . Severe sepsis (HCC) 11/06/2019  . Diverticulitis 11/02/2019  . Intra-abdominal abscess (HCC) 11/02/2019    Past Surgical History:  Procedure Laterality Date  . ABDOMINAL HYSTERECTOMY       OB History   No obstetric history on file.     Family History  Problem Relation Age of Onset  . Cancer Neg Hx     Social History   Tobacco Use  . Smoking status: Never Smoker  . Smokeless tobacco: Never Used  Substance Use Topics  . Alcohol use: No  . Drug use: No    Home Medications Prior to Admission medications   Medication Sig Start Date End Date Taking? Authorizing Provider  ALPRAZolam Prudy Feeler)  0.5 MG tablet Take 0.5 mg by mouth 3 (three) times daily as needed for anxiety.   Yes [provider]  ferrous sulfate 325 (65 FE) MG tablet Take 1 tablet (325 mg total) by mouth 2 (two) times daily with a meal. 11/10/19 12/10/19 Yes Pahwani, Daleen Bo, MD  hyoscyamine (LEVSIN) 0.125 MG tablet Take 0.125 mg by mouth every 6 (six) hours as needed for bladder spasms or cramping.  10/21/19  Yes [provider]  levofloxacin (LEVAQUIN) 500 MG tablet Take 1 tablet (500 mg total) by mouth daily for 25 days. 11/10/19 12/05/19 Yes Meuth, Lina Sar, PA-C  losartan-hydrochlorothiazide (HYZAAR) 50-12.5 MG per tablet Take 1 tablet by mouth daily.   Yes [provider]  metroNIDAZOLE (FLAGYL) 500 MG tablet Take 1 tablet (500 mg total) by mouth 3 (three) times daily for 25 days. 11/10/19 12/05/19 Yes Meuth, Lina Sar, PA-C  promethazine (PHENERGAN) 25 MG tablet Take 25 mg by mouth 2 (two) times daily as needed for nausea or vomiting.  10/21/19  Yes [provider]  ondansetron (ZOFRAN) 4 MG tablet Take 1 tablet (4 mg total) by mouth every 6 (six) hours as needed for nausea. Patient not taking: Reported on 11/14/2019 11/10/19   Carlena Bjornstad A, PA-C  oxyCODONE (OXY IR/ROXICODONE) 5 MG immediate release tablet Take 1 tablet (5 mg total) by mouth every 6 (six) hours as needed for moderate pain or severe pain. 11/10/19  Meuth, Brooke A, PA-C  saccharomyces boulardii (FLORASTOR) 250 MG capsule Take 1 capsule (250 mg total) by mouth 2 (two) times daily. 11/10/19   Meuth, Lina Sar, PA-C    Allergies    Aspirin  Review of Systems   Review of Systems Ten systems reviewed and are negative for acute change, except as noted in the HPI.   Physical Exam Updated Vital Signs BP 127/85 (BP Location: Left Arm)   Pulse (!) 108   Temp 98.8 F (37.1 C)   Resp 16   Ht 5' 6.5" (1.689 m)   Wt 90.2 kg   SpO2 100%   BMI 31.62 kg/m   Physical Exam Vitals and nursing note reviewed.  Constitutional:      General:  She is not in acute distress.    Appearance: She is well-developed. She is not diaphoretic.  HENT:     Head: Normocephalic and atraumatic.  Eyes:     General: No scleral icterus.    Conjunctiva/sclera: Conjunctivae normal.  Cardiovascular:     Rate and Rhythm: Normal rate and regular rhythm.     Heart sounds: Normal heart sounds. No murmur heard.  No friction rub. No gallop.   Pulmonary:     Effort: Pulmonary effort is normal. No respiratory distress.     Breath sounds: Normal breath sounds.  Abdominal:     General: Bowel sounds are normal. There is distension.     Palpations: Abdomen is soft. There is no mass.     Tenderness: There is abdominal tenderness. There is no guarding.     Comments: 2 JP drains present in the lower abdomen.  There is foul-smelling feco-purulent appearing discharge around the drain sites with drainage in the JP drains themselves as well.  Exquisitely tender to palpation.  Musculoskeletal:     Cervical back: Normal range of motion.  Skin:    General: Skin is warm and dry.  Neurological:     Mental Status: She is alert and oriented to person, place, and time.  Psychiatric:        Behavior: Behavior normal.     ED Results / Procedures / Treatments   Labs (all labs ordered are listed, but only abnormal results are displayed) Labs Reviewed  COMPREHENSIVE METABOLIC PANEL - Abnormal; Notable for the following components:      Result Value   Glucose, Bld 119 (*)    BUN <5 (*)    Calcium 8.6 (*)    Albumin 2.5 (*)    All other components within normal limits  CBC WITH DIFFERENTIAL/PLATELET - Abnormal; Notable for the following components:   WBC 17.5 (*)    Hemoglobin 10.0 (*)    HCT 30.4 (*)    MCV 72.0 (*)    MCH 23.7 (*)    RDW 19.7 (*)    Platelets 438 (*)    Neutro Abs 14.6 (*)    Abs Immature Granulocytes 0.09 (*)    All other components within normal limits  CBC WITH DIFFERENTIAL/PLATELET - Abnormal; Notable for the following components:    WBC 20.2 (*)    Hemoglobin 9.2 (*)    HCT 27.6 (*)    MCV 70.2 (*)    MCH 23.4 (*)    RDW 19.2 (*)    Neutro Abs 17.5 (*)    Abs Immature Granulocytes 0.13 (*)    All other components within normal limits  COMPREHENSIVE METABOLIC PANEL - Abnormal; Notable for the following components:   Glucose, Bld 132 (*)  BUN 5 (*)    Calcium 8.3 (*)    Total Protein 6.3 (*)    Albumin 2.2 (*)    AST 12 (*)    Total Bilirubin 1.3 (*)    All other components within normal limits  SARS CORONAVIRUS 2 BY RT PCR (HOSPITAL ORDER, PERFORMED IN [facility] [place] [facility])  BODY FLUID CULTURE  CULTURE, BLOOD (ROUTINE X 2)  CULTURE, BLOOD (ROUTINE X 2)  LACTIC ACID, PLASMA  MAGNESIUM  URINALYSIS, ROUTINE W REFLEX MICROSCOPIC  I-STAT BETA HCG BLOOD, ED ([facility], [facility], [facility] ONLY)    EKG   Radiology CT ABDOMEN PELVIS W CONTRAST  Result Date: 11/14/2019 CLINICAL DATA:  Intra-abdominal abscess. JP drain not draining well, accumulation of drainage on the T-drain sponge. EXAM: CT ABDOMEN AND PELVIS WITH CONTRAST TECHNIQUE: Multidetector CT imaging of the abdomen and pelvis was performed using the standard protocol following bolus administration of intravenous contrast. CONTRAST:  OMNIPAQUE IOHEXOL 300 MG/ML  SOLN COMPARISON:  Multiple exams, including 11/02/2019 FINDINGS: Lower chest: Unremarkable Hepatobiliary: Heterogeneity in the gallbladder could reflect gallstones or sludge. 0.7 by 0.4 cm hypodense lesion in the lateral segment left hepatic lobe on image 12/2 is technically too small to characterize although statistically likely to be a benign cyst or similar benign lesion. No significant biliary dilatation. Pancreas: Unremarkable Spleen: Unremarkable Adrenals/Urinary Tract: The kidneys and ureters appear unremarkable. There is an abscess along the upper margin of the urinary bladder as noted below. Stomach/Bowel: There is considerable descending and sigmoid colon diverticulosis. There is an apparent fistulous  connection between the sigmoid colon and the abscess collection above the urinary bladder shown on image 95/5. There is also local inflammation in this vicinity such that active diverticulitis may be persisting. Likewise, cannot exclude a second fistulous connection between the proximal sigmoid colon and the abscess collection above the urinary bladder eccentric to the left on image 106/5. There are 2 dominant abscesses in this vicinity. The lesion just above the urinary bladder measures proximally 7.6 by 3.4 by 3.7 cm (volume = 50 cm^3), within this abscess there is coiled a pigtail drain is coiled. A second lesion is present in the subcutaneous tissues just anterior to the linea alba, and this second abscess also contains a pigtail catheter and is traversed by the first pigtail catheter is well. This second abscess measures 7.6 by 4.2 by 5.8 cm (volume = 97 cm^3) and contains fluid and gas. There is surrounding inflammatory phlegmon in the subcutaneous adipose tissues. Small amount of anterior pelvic ascites noted bilaterally, but without obvious enhancing margins to suggest that these represent additional abscesses. No discontinuity or obvious kinking of the intra-portions the drainage catheters identified. Vascular/Lymphatic: Unremarkable Reproductive: Uterus absent. Other: No supplemental non-categorized findings. Musculoskeletal: Multilevel facet arthropathy in the lumbar spine, with suspected associated right foraminal impingement at L1-2 and L4-5, and left foraminal impingement at L1-2, L3-4, likely L4-5. IMPRESSION: 1. A 50 cubic cm abscess just above the urinary bladder has at least 1 and possibly 2 fistulous connections to the inflamed sigmoid colon. Descending and sigmoid colon diverticulosis. A pigtail catheter is present with loop formed within this abscess. 2. A 97 cubic cm abscess is present in the midline subcutaneous tissues of the anterior pelvis, with surrounding inflammatory stranding in the  subcutaneous tissues indicating phlegmon. A pigtail catheter is present with loop formed within this abscess. The other pigtail catheter traverses this abscess to reach the abscess above the urinary bladder. 3. Small amounts of ascites anteriorly in the  pelvis, without marginal enhancement to suggest that these collections represent new abscesses. 4. Heterogeneity in the gallbladder could reflect gallstones or sludge. 5. Multilevel facet arthropathy in the lumbar spine with associated foraminal impingement. Electronically Signed   By: Gaylyn Rong M.D.   On: 11/14/2019 20:56    Procedures Procedures (including critical care time)  Medications Ordered in ED Medications  traMADol (ULTRAM) tablet 50 mg ( Oral See Alternative 11/15/19 0946)    Or  traMADol (ULTRAM) tablet 100 mg (100 mg Oral Given 11/15/19 0946)  piperacillin-tazobactam (ZOSYN) IVPB 3.375 g (3.375 g Intravenous New Bag/Given 11/15/19 0426)  lactated ringers bolus 1,000 mL (has no administration in time range)    Followed by  lactated ringers infusion ( Intravenous New Bag/Given 11/15/19 0147)  losartan (COZAAR) tablet 50 mg (50 mg Oral Given 11/15/19 0942)  ALPRAZolam Prudy Feeler) tablet 0.5 mg (0.5 mg Oral Given 11/15/19 0143)  hyoscyamine (LEVSIN SL) SL tablet 0.125 mg (has no administration in time range)  saccharomyces boulardii (FLORASTOR) capsule 250 mg (250 mg Oral Given 11/15/19 0942)  ferrous sulfate tablet 325 mg (325 mg Oral Given 11/15/19 0942)  polyethylene glycol (MIRALAX / GLYCOLAX) packet 17 g (17 g Oral Given 11/15/19 0956)  ondansetron (ZOFRAN) tablet 4 mg (has no administration in time range)    Or  ondansetron (ZOFRAN) injection 4 mg (has no administration in time range)  acetaminophen (TYLENOL) tablet 650 mg (has no administration in time range)    Or  acetaminophen (TYLENOL) suppository 650 mg (has no administration in time range)  enoxaparin (LOVENOX) injection 40 mg (40 mg Subcutaneous Given 11/15/19 0956)    iohexol (OMNIPAQUE) 300 MG/ML solution 100 mL (100 mLs Intravenous Contrast Given 11/14/19 2029)  piperacillin-tazobactam (ZOSYN) IVPB 3.375 g (0 g Intravenous Stopped 11/14/19 2250)    ED Course  I have reviewed the triage vital signs and the nursing notes.  Pertinent labs & imaging results that were available during my care of the patient were reviewed by me and considered in my medical decision making (see chart for details).    MDM Rules/Calculators/A&P                          Patient here with c/o foul/ copious discharge from her drain sites. Hx of perforated diverticulitis with development of intraabdominal abscess s/p R/LLQ drain placement by IR on 11/04/2010. I personally reviewed the CT which shows worsening abscesses, with both colonic and bladder fistula formation and feco-purulent discharge. Patient seems to be failing current treatment with full liquid diet, flagyl, and levaquin. She has impending sepsis as evidenced by labs with increasing WBC count up from 10 to almost 18 and persistent tahcycardia. Lactic acid WNL. Patient given Zosyn and Fluids. EKG shows sinus tach at 113 + prolonged QT. Case discussed with Dr. Corliss Skains wh will see the patient but recommends IR evaluation and Dr. Leafy Half who will admit the patient to [facility] service.   Final Clinical Impression(s) / ED Diagnoses Final diagnoses:  Diverticulitis of large intestine with abscess    Rx / DC Orders ED Discharge Orders    None       Arthor Captain, PA-C 11/16/19 0008    Cathren Laine, MD 11/19/19 1433

## 2019-11-15 DIAGNOSIS — E669 Obesity, unspecified: Secondary | ICD-10-CM | POA: Diagnosis present

## 2019-11-15 DIAGNOSIS — K5720 Diverticulitis of large intestine with perforation and abscess without bleeding: Secondary | ICD-10-CM | POA: Diagnosis present

## 2019-11-15 DIAGNOSIS — D638 Anemia in other chronic diseases classified elsewhere: Secondary | ICD-10-CM

## 2019-11-15 DIAGNOSIS — I10 Essential (primary) hypertension: Secondary | ICD-10-CM | POA: Diagnosis present

## 2019-11-15 DIAGNOSIS — Z79899 Other long term (current) drug therapy: Secondary | ICD-10-CM | POA: Diagnosis not present

## 2019-11-15 DIAGNOSIS — F419 Anxiety disorder, unspecified: Secondary | ICD-10-CM | POA: Diagnosis present

## 2019-11-15 DIAGNOSIS — R739 Hyperglycemia, unspecified: Secondary | ICD-10-CM | POA: Diagnosis present

## 2019-11-15 DIAGNOSIS — Z20822 Contact with and (suspected) exposure to covid-19: Secondary | ICD-10-CM | POA: Diagnosis present

## 2019-11-15 DIAGNOSIS — A419 Sepsis, unspecified organism: Secondary | ICD-10-CM | POA: Diagnosis present

## 2019-11-15 DIAGNOSIS — R1030 Lower abdominal pain, unspecified: Secondary | ICD-10-CM | POA: Diagnosis present

## 2019-11-15 DIAGNOSIS — Z90710 Acquired absence of both cervix and uterus: Secondary | ICD-10-CM | POA: Diagnosis not present

## 2019-11-15 DIAGNOSIS — R188 Other ascites: Secondary | ICD-10-CM | POA: Diagnosis present

## 2019-11-15 DIAGNOSIS — D509 Iron deficiency anemia, unspecified: Secondary | ICD-10-CM | POA: Diagnosis present

## 2019-11-15 DIAGNOSIS — Z6831 Body mass index (BMI) 31.0-31.9, adult: Secondary | ICD-10-CM | POA: Diagnosis not present

## 2019-11-15 DIAGNOSIS — Z886 Allergy status to analgesic agent status: Secondary | ICD-10-CM | POA: Diagnosis not present

## 2019-11-15 LAB — CBC WITH DIFFERENTIAL/PLATELET
Abs Immature Granulocytes: 0.13 10*3/uL — ABNORMAL HIGH (ref 0.00–0.07)
Basophils Absolute: 0.1 10*3/uL (ref 0.0–0.1)
Basophils Relative: 0 %
Eosinophils Absolute: 0 10*3/uL (ref 0.0–0.5)
Eosinophils Relative: 0 %
HCT: 27.6 % — ABNORMAL LOW (ref 36.0–46.0)
Hemoglobin: 9.2 g/dL — ABNORMAL LOW (ref 12.0–15.0)
Immature Granulocytes: 1 %
Lymphocytes Relative: 8 %
Lymphs Abs: 1.6 10*3/uL (ref 0.7–4.0)
MCH: 23.4 pg — ABNORMAL LOW (ref 26.0–34.0)
MCHC: 33.3 g/dL (ref 30.0–36.0)
MCV: 70.2 fL — ABNORMAL LOW (ref 80.0–100.0)
Monocytes Absolute: 0.9 10*3/uL (ref 0.1–1.0)
Monocytes Relative: 4 %
Neutro Abs: 17.5 10*3/uL — ABNORMAL HIGH (ref 1.7–7.7)
Neutrophils Relative %: 87 %
Platelets: 356 10*3/uL (ref 150–400)
RBC: 3.93 MIL/uL (ref 3.87–5.11)
RDW: 19.2 % — ABNORMAL HIGH (ref 11.5–15.5)
WBC: 20.2 10*3/uL — ABNORMAL HIGH (ref 4.0–10.5)
nRBC: 0 % (ref 0.0–0.2)

## 2019-11-15 LAB — COMPREHENSIVE METABOLIC PANEL
ALT: 9 U/L (ref 0–44)
AST: 12 U/L — ABNORMAL LOW (ref 15–41)
Albumin: 2.2 g/dL — ABNORMAL LOW (ref 3.5–5.0)
Alkaline Phosphatase: 55 U/L (ref 38–126)
Anion gap: 10 (ref 5–15)
BUN: 5 mg/dL — ABNORMAL LOW (ref 6–20)
CO2: 25 mmol/L (ref 22–32)
Calcium: 8.3 mg/dL — ABNORMAL LOW (ref 8.9–10.3)
Chloride: 102 mmol/L (ref 98–111)
Creatinine, Ser: 0.83 mg/dL (ref 0.44–1.00)
GFR calc Af Amer: >60 mL/min (ref >60)
GFR calc non Af Amer: >60 mL/min (ref >60)
Glucose, Bld: 132 mg/dL — ABNORMAL HIGH (ref 70–99)
Potassium: 3.6 mmol/L (ref 3.5–5.1)
Sodium: 137 mmol/L (ref 135–145)
Total Bilirubin: 1.3 mg/dL — ABNORMAL HIGH (ref 0.3–1.2)
Total Protein: 6.3 g/dL — ABNORMAL LOW (ref 6.5–8.1)

## 2019-11-15 LAB — MAGNESIUM: Magnesium: 1.8 mg/dL (ref 1.7–2.4)

## 2019-11-15 MED ORDER — ACETAMINOPHEN 325 MG PO TABS
650.0000 mg | ORAL_TABLET | Freq: Four times a day (QID) | ORAL | Status: DC | PRN
  Administered 2019-11-16: 650 mg via ORAL
  Filled 2019-11-15: qty 2

## 2019-11-15 MED ORDER — ENOXAPARIN SODIUM 40 MG/0.4ML ~~LOC~~ SOLN
40.0000 mg | SUBCUTANEOUS | Status: DC
  Administered 2019-11-15 – 2019-11-17 (×3): 40 mg via SUBCUTANEOUS
  Filled 2019-11-15 (×3): qty 0.4

## 2019-11-15 MED ORDER — HYOSCYAMINE SULFATE 0.125 MG SL SUBL: 0.1250 mg | SUBLINGUAL_TABLET | Freq: Four times a day (QID) | SUBLINGUAL | Status: DC | PRN

## 2019-11-15 MED ORDER — FERROUS SULFATE 325 (65 FE) MG PO TABS
325.0000 mg | ORAL_TABLET | Freq: Two times a day (BID) | ORAL | Status: DC
  Administered 2019-11-15 – 2019-11-17 (×5): 325 mg via ORAL
  Filled 2019-11-15 (×5): qty 1

## 2019-11-15 MED ORDER — PIPERACILLIN-TAZOBACTAM 3.375 G IVPB
3.3750 g | Freq: Three times a day (TID) | INTRAVENOUS | Status: DC
  Administered 2019-11-15 – 2019-11-17 (×8): 3.375 g via INTRAVENOUS
  Filled 2019-11-15 (×8): qty 50

## 2019-11-15 MED ORDER — SACCHAROMYCES BOULARDII 250 MG PO CAPS
250.0000 mg | ORAL_CAPSULE | Freq: Two times a day (BID) | ORAL | Status: DC
  Administered 2019-11-15 – 2019-11-17 (×5): 250 mg via ORAL
  Filled 2019-11-15 (×5): qty 1

## 2019-11-15 MED ORDER — POLYETHYLENE GLYCOL 3350 17 G PO PACK
17.0000 g | PACK | Freq: Every day | ORAL | Status: DC | PRN
  Administered 2019-11-15: 17 g via ORAL
  Filled 2019-11-15: qty 1

## 2019-11-15 MED ORDER — ACETAMINOPHEN 650 MG RE SUPP: 650.0000 mg | Freq: Four times a day (QID) | RECTAL | Status: DC | PRN

## 2019-11-15 MED ORDER — LACTATED RINGERS IV SOLN: INTRAVENOUS | Status: AC

## 2019-11-15 MED ORDER — ONDANSETRON HCL 4 MG PO TABS: 4.0000 mg | ORAL_TABLET | Freq: Four times a day (QID) | ORAL | Status: DC | PRN

## 2019-11-15 MED ORDER — LOSARTAN POTASSIUM 50 MG PO TABS
50.0000 mg | ORAL_TABLET | Freq: Every day | ORAL | Status: DC
  Administered 2019-11-15 – 2019-11-17 (×3): 50 mg via ORAL
  Filled 2019-11-15 (×3): qty 1

## 2019-11-15 MED ORDER — ALPRAZOLAM 0.5 MG PO TABS
0.5000 mg | ORAL_TABLET | Freq: Three times a day (TID) | ORAL | Status: DC | PRN
  Administered 2019-11-15 – 2019-11-16 (×5): 0.5 mg via ORAL
  Filled 2019-11-15 (×5): qty 1

## 2019-11-15 MED ORDER — LACTATED RINGERS IV BOLUS: 1000.0000 mL | Freq: Once | INTRAVENOUS | Status: DC

## 2019-11-15 MED ORDER — ONDANSETRON HCL 4 MG/2ML IJ SOLN: 4.0000 mg | Freq: Four times a day (QID) | INTRAMUSCULAR | Status: DC | PRN

## 2019-11-15 NOTE — Progress Notes (Signed)
[facility]:  [phone] General Surgery Progress Note   LOS: 0 days  POD -     Assessment and Plan: 1.  Diverticular abscess  IR drainage x 2 on 8/3 (note one abscess is extraabdominal in subcutaneous tissue)  WBC - 20,200 - 11/15/2019  Zosyn  IR came by and has given patient/nursing instructions for drain management, will start clear liquids (would not advance diet beyond this until WBC decreasing), needs to ambulate  2.  HTN 3.  Anemia  Hgb - 9,200 - 11/15/2019 5.  DVT prophylaxis - will start Lovenox   Principal Problem:   Diverticulitis of large intestine with abscess Active Problems:   Essential hypertension   Anemia, chronic disease  Subjective:  Feels better today.  IR instructed her in drain management.  Objective:   Vitals:   11/15/19 0539 11/15/19 0919  BP: 101/65 127/85  Pulse: 98 (!) 108  Resp:  16  Temp: 98.9 F (37.2 C) 98.8 F (37.1 C)  SpO2: 99% 100%     Intake/Output from previous day:  08/13 0701 - 08/14 0700 In: 50 [IV Piggyback:50] Out: 20 [Drains:20]  Intake/Output this shift:  No intake/output data recorded.   Physical Exam:   General: Obese AA F who is alert and oriented.    HEENT: Normal. Pupils equal. .   Lungs: Clear   Abdomen: Soft.  Has two drains in the lower midline.  Purulence in drains.   Lab Results:    Recent Labs    11/14/19 1116 11/15/19 0216  WBC 17.5* 20.2*  HGB 10.0* 9.2*  HCT 30.4* 27.6*  PLT 438* 356    BMET   Recent Labs    11/14/19 1116 11/15/19 0216  NA 140 137  K 3.7 3.6  CL 104 102  CO2 25 25  GLUCOSE 119* 132*  BUN <5* 5*  CREATININE 0.89 0.83  CALCIUM 8.6* 8.3*    PT/INR  No results for input(s): LABPROT, INR in the last 72 hours.  ABG  No results for input(s): PHART, HCO3 in the last 72 hours.  Invalid input(s): PCO2, PO2   Studies/Results:  CT ABDOMEN PELVIS W CONTRAST  Result Date: 11/14/2019 CLINICAL DATA:  Intra-abdominal abscess. JP drain not draining  well, accumulation of drainage on the T-drain sponge. EXAM: CT ABDOMEN AND PELVIS WITH CONTRAST TECHNIQUE: Multidetector CT imaging of the abdomen and pelvis was performed using the standard protocol following bolus administration of intravenous contrast. CONTRAST:  OMNIPAQUE IOHEXOL 300 MG/ML  SOLN COMPARISON:  Multiple exams, including 11/02/2019 FINDINGS: Lower chest: Unremarkable Hepatobiliary: Heterogeneity in the gallbladder could reflect gallstones or sludge. 0.7 by 0.4 cm hypodense lesion in the lateral segment left hepatic lobe on image 12/2 is technically too small to characterize although statistically likely to be a benign cyst or similar benign lesion. No significant biliary dilatation. Pancreas: Unremarkable Spleen: Unremarkable Adrenals/Urinary Tract: The kidneys and ureters appear unremarkable. There is an abscess along the upper margin of the urinary bladder as noted below. Stomach/Bowel: There is considerable descending and sigmoid colon diverticulosis. There is an apparent fistulous connection between the sigmoid colon and the abscess collection above the urinary bladder shown on image 95/5. There is also local inflammation in this vicinity such that active diverticulitis may be persisting. Likewise, cannot exclude a second fistulous connection between the proximal sigmoid colon and the abscess collection above the urinary bladder eccentric to the left on image 106/5. There are 2 dominant abscesses in this vicinity. The lesion just above  the urinary bladder measures proximally 7.6 by 3.4 by 3.7 cm (volume = 50 cm^3), within this abscess there is coiled a pigtail drain is coiled. A second lesion is present in the subcutaneous tissues just anterior to the linea alba, and this second abscess also contains a pigtail catheter and is traversed by the first pigtail catheter is well. This second abscess measures 7.6 by 4.2 by 5.8 cm (volume = 97 cm^3) and contains fluid and gas. There is surrounding  inflammatory phlegmon in the subcutaneous adipose tissues. Small amount of anterior pelvic ascites noted bilaterally, but without obvious enhancing margins to suggest that these represent additional abscesses. No discontinuity or obvious kinking of the intra-portions the drainage catheters identified. Vascular/Lymphatic: Unremarkable Reproductive: Uterus absent. Other: No supplemental non-categorized findings. Musculoskeletal: Multilevel facet arthropathy in the lumbar spine, with suspected associated right foraminal impingement at L1-2 and L4-5, and left foraminal impingement at L1-2, L3-4, likely L4-5. IMPRESSION: 1. A 50 cubic cm abscess just above the urinary bladder has at least 1 and possibly 2 fistulous connections to the inflamed sigmoid colon. Descending and sigmoid colon diverticulosis. A pigtail catheter is present with loop formed within this abscess. 2. A 97 cubic cm abscess is present in the midline subcutaneous tissues of the anterior pelvis, with surrounding inflammatory stranding in the subcutaneous tissues indicating phlegmon. A pigtail catheter is present with loop formed within this abscess. The other pigtail catheter traverses this abscess to reach the abscess above the urinary bladder. 3. Small amounts of ascites anteriorly in the pelvis, without marginal enhancement to suggest that these collections represent new abscesses. 4. Heterogeneity in the gallbladder could reflect gallstones or sludge. 5. Multilevel facet arthropathy in the lumbar spine with associated foraminal impingement. Electronically Signed   By: Gaylyn Rong M.D.   On: 11/14/2019 20:56     Anti-infectives:   Anti-infectives (From admission, onward)   Start     Dose/Rate Route Frequency Ordered Stop   11/15/19 0400  piperacillin-tazobactam (ZOSYN) IVPB 3.375 g     Discontinue     3.375 g 12.5 mL/hr over 240 Minutes Intravenous Every 8 hours 11/15/19 0119     11/14/19 2130  piperacillin-tazobactam (ZOSYN) IVPB  3.375 g        3.375 g 100 mL/hr over 30 Minutes Intravenous  Once 11/14/19 2116 11/14/19 2250      Ovidio Kin, MD, [facility] Surgery Office: [phone] 11/15/2019

## 2019-11-15 NOTE — Progress Notes (Signed)
   11/15/19 0100  Assess: MEWS Score  Temp 99.7 F (37.6 C)  BP 98/81  Pulse Rate (!) 109 (rn notified)  Resp 20  SpO2 100 %  Assess: MEWS Score  MEWS Temp 0  MEWS Systolic 1  MEWS Pulse 1  MEWS RR 0  MEWS LOC 0  MEWS Score 2  MEWS Score Color Yellow  Assess: if the MEWS score is Yellow or Red  Were vital signs taken at a resting state? Yes  Focused Assessment No change from prior assessment  Early Detection of Sepsis Score *See Row Information* Low  MEWS guidelines implemented *See Row Information* No, vital signs rechecked  Treat  MEWS Interventions Administered prn meds/treatments  Pain Scale 0-10  Pain Score 0  Escalate  MEWS: Escalate Yellow: discuss with charge nurse/RN and consider discussing with provider and RRT  Notify: Charge Nurse/RN  Name of Charge Nurse/RN Notified Betsey Amen  Date Charge Nurse/RN Notified 11/15/19  Time Charge Nurse/RN Notified 0130  Document  Patient Outcome Stabilized after interventions  Progress note created (see row info) Yes

## 2019-11-15 NOTE — Progress Notes (Signed)
PROGRESS NOTE    [name]  HQP:[id number] DOB: [birth year] DOA: 11/14/2019 PCP: Kristen Loader, FNP   Brief Narrative:  HPI per Dr. Inda Merlin on 11/14/19 54 year old female with past medical history of hypertension, diverticulosis and frequent bouts of diverticulitis over at least the past year who presents to [facility] emergency department due to complaints of persisting lower abdominal pain after recent diagnosis of complicated diverticulitis.  Of note, patient was recently hospitalized at [facility] from 5/9-9/3 for complicated diverticulitis with severe sepsis after failing outpatient antibiotic therapy.  Diverticulitis was complicated by multiple abdominal abscesses that ended up growing out E. coli, Pseudomonas and Streptococcus.  Blood cultures remain negative throughout the hospitalization.  Patient received intravenous Zosyn throughout the hospitalization.  Interventional radiology and general surgery were consulted and the decision was made to place drains in to the abscesses in the patient's abdomen.  General surgery recommended going home on oral antibiotic therapy on a full liquid diet with outpatient follow-up.  Patient was eventually discharged home on levofloxacin and Flagyl on 8/9.  Unfortunately, patient reports a delay in starting her oral antibiotic therapy due to her pharmacy not having IV antibiotics in stock.  Patient did not start antibiotic therapy until 8/11.  Over the span of time, patient states that the drainage coming from both sites turned from a cream color to brown and became more and more foul-smelling.  Additionally, patient began to experience drainage around the drain insertion site as well.  Patient continued to experience lower abdominal pain that she describes as mild to moderate intensity, sharp in quality, worse with movement and nonradiating.  Pain continue to persist for several days.  Patient denies any associated fevers,  weakness, changes in appetite or diarrhea.  Patient eventually presented to [facility] emergency department for evaluation.  Upon evaluation in the emergency department patient was found to have a worsening leukocytosis and other SIRS criteria including tachycardia.  CT imaging of the abdomen was performed revealing redemonstration of multiple large abscesses with drainage catheters seemingly in place with additional small amounts of ascites noted anteriorly in the pelvis concerning for additional new abscesses.  One of the abscesses appeared to be developing fistulous connections to the inflamed sigmoid colon.  Case was discussed with Dr.Tsuei with general surgery who recommended admission by the hospitalist group with IR consultation for possible replacement of the drains which he feels are likely clogged.  Patient was initiated on intravenous Zosyn.  The hospitalist group was then called to assess the patient for admission to the hospital.  **Interim History General surgery and interventional radiology evaluated.  Drains were flushed and general surgery recommends continue clear liquid diet for now.  Interventional radiology does not recommend replacing the drains at this time.  Assessment & Plan:   Principal Problem:   Diverticulitis of large intestine with abscess Active Problems:   Essential hypertension   Anemia, chronic disease  Diverticulitis of large intestine with Diverticular Abscess s/p 2 Percutaneous Drains Sepsis, poA 2/2 to Above -Patient presenting with extremely complicated diverticulitis of the sigmoid colon with development of multiple large abscesses with concern for clinical worsening since her recent discharge.   -Additionally, there is concern for developing fistulous to the sigmoid colon which would likely explain the feculent appearing material and foul smell on examination. -This interval worsening is in part likely secondary to the significant delay in  initiating oral antibiotic therapy in the outpatient setting. -While both catheters seem to be inserted  in each of the large abscess sites, general surgery (Dr. Georgette Dover) feels that they are likely clogged and is requesting that IR be reconsulted for replacement of these catheters/drains. -She was placed on intravenous Zosyn and will continue for now. -Hydrating patient with intravenous isotonic fluids; she is given a bolus of lactated Ringer's 1 L and then started on lactated Ringer infusion of 125 mL's per hour for 16 hours -Patient exhibited multiple SIRS criteria and met sepsis Criteriabut does not reveal any evidence of organ dysfunction to suggest underlying severe sepsis at this time; her heart rate was elevated to 140 and her respiratory rate was elevated (>20) on admission along with elevated WBC on admission sepsis patient had a heart rate of 141 respiratory rate of 24 WBC is 17.5 and she came in with diverticular abscess blocked drain -WBC worsened and went from 10.5 on the day of discharge last week on 11/10/2019 and worsened to 17.5 on admission is now 20.2 -Blood cultures and drainage cultures have been ordered. -Of note, previous drainage culture on 8/3 grew E. coli, Pseudomonas Aeruginosa and Streptococcus Constellatus -General surgery stated that they will continue to follow, the recommendations are appreciated.   -General surgery recommending the patient be on a clear liquid diet for now but would not advance this beyond until her WBC starts to decrease -Interventional radiology was consulted and they gave the patient and nursing instructions for drain management and they do not recommend drain exchange at this time and recommend continue current drain management with every shift flushes and monitoring output.  They plan for repeat CT/possible drain injection 1 output less than 10 cc/day -As needed analgesics with tramadol 50 mg p.o. every 6 as needed for moderate pain and 100 mg p.o. every 6  hours as needed for severe pain for associated abdominal pain; also continue with hyoscyamine 0.125 mg p.o. every 6 hours bladder spasms and abdominal cramping -Continue supportive care and continue with antiemetics with p.o./IV Zofran -Continue with bowel regimen with MiraLAX  Essential Hypertension -Continue home regimen of antihypertensive therapy with losartan 50 mg p.o. daily  Anemia of chronic disease/Microcytic Anemia -Recent iron panel suggestive of anemia of chronic disease, discharging provider during last hospitalization feels there is likely some superimposed iron deficiency as well.  -Patient's hemoglobin/hematocrit went from 10.0/30.4 and is now 9.2/27.6 with an MCV of 70.2 today -We will continue previously prescribed regimen of oral iron supplementation with ferrous sulfate 325 mg p.o. twice daily. -Monitoring hemoglobin and hematocrit with serial CBCs -Continue to monitor for signs and symptoms of bleeding; currently no overt bleeding noted   Anxiety -Continue with Alprazolam 0.5 mg p.o. 3 times daily as needed for anxiety  Hyperbilirubinemia -Patient's T bili was slightly elevated and went from 1.0 is now 1.2  -continue to monitor and trend and repeat CMP in the a.m.  Hyperglycemia -Patient's blood sugar was elevated on admission at 119 and repeat this morning was 132 -Check hemoglobin A1c -Continue monitor and trend blood sugars carefully and if necessary will place on a sensitive NovoLog sign scale insulin AC  Obesity -Estimated body mass index is 31.62 kg/m as calculated from the following:   Height as of this encounter: 5' 6.5" (1.689 m).   Weight as of this encounter: 90.2 kg. -Weight Loss and Dietary Counseling given  DVT prophylaxis: Enoxaparin 40 mg sq q24h Code Status: FULL CODE Family Communication: No family present at bedside Disposition Plan: Pending advancement of her diet and tolerance of her diet as well  as clearance by general surgery and  interventional radiology  Status is: Inpatient  Remains inpatient appropriate because:IV treatments appropriate due to intensity of illness or inability to take PO and Inpatient level of care appropriate due to severity of illness   Dispo: The patient is from: Home              Anticipated d/c is to: Home              Anticipated d/c date is: 2 days              Patient currently is not medically stable to d/c.   Consultants:   General Surgery  Interventional Radiology    Procedures: None  Antimicrobials:  Anti-infectives (From admission, onward)   Start     Dose/Rate Route Frequency Ordered Stop   11/15/19 0400  piperacillin-tazobactam (ZOSYN) IVPB 3.375 g     Discontinue     3.375 g 12.5 mL/hr over 240 Minutes Intravenous Every 8 hours 11/15/19 0119     11/14/19 2130  piperacillin-tazobactam (ZOSYN) IVPB 3.375 g        3.375 g 100 mL/hr over 30 Minutes Intravenous  Once 11/14/19 2116 11/14/19 2250     Subjective: Seen and examined at bedside and patient states that she is feeling a little bit better today and understands now hard to utilize her drains properly.  States that she missed her antibiotics as an outpatient because she had to have them order from her pharmacy.  No nausea or vomiting currently.  Feels like she would benefit from a stool softener and MiraLAX for a bowel movement.  No other concerns or complaints at this time.  Objective: Vitals:   11/15/19 0420 11/15/19 0539 11/15/19 0919 11/15/19 1347  BP: 115/70 101/65 127/85 111/71  Pulse: (!) 101 98 (!) 108 (!) 101  Resp: 18  16 16   Temp: 98.9 F (37.2 C) 98.9 F (37.2 C) 98.8 F (37.1 C) 98.8 F (37.1 C)  TempSrc: Oral     SpO2: 100% 99% 100% 100%  Weight:      Height:        Intake/Output Summary (Last 24 hours) at 11/15/2019 1348 Last data filed at 11/15/2019 1059 Gross per 24 hour  Intake 60 ml  Output 60 ml  Net 0 ml   Filed Weights   11/14/19 1053 11/15/19 0100  Weight: 96.6 kg 90.2 kg    Examination: Physical Exam:  Constitutional: Well-nourished, well-developed obese African-American female currently in no acute distress appears calm  Eyes: Lids and conjunctivae normal, sclerae anicteric  ENMT: External Ears, Nose appear normal. Grossly normal hearing.  Neck: Appears normal, supple, no cervical masses, normal ROM, no appreciable thyromegaly; no JVD Respiratory: Diminished to auscultation bilaterally, no wheezing, rales, rhonchi or crackles. Normal respiratory effort and patient is not tachypenic. No accessory muscle use. Unlabored breathing  Cardiovascular: RRR, no murmurs / rubs / gallops. S1 and S2 auscultated. No extremity edema.  Abdomen: Soft, Slightly tender, Distended 2/2 to body habitus. Two abdominal Drains in place. Bowel sounds positive x4.  GU: Deferred. Musculoskeletal: No clubbing / cyanosis of digits/nails. No joint deformity upper and lower extremities.  Skin: No rashes, lesions, ulcers on a limited skin evaluation. No induration; Warm and dry.  Neurologic: CN 2-12 grossly intact with no focal deficits. Romberg sign and cerebellar reflexes not assessed.  Psychiatric: Normal judgment and insight. Alert and oriented x 3. Normal mood and appropriate affect.   Data Reviewed: I have personally reviewed  following labs and imaging studies  CBC: Recent Labs  Lab 11/09/19 0553 11/10/19 0332 11/14/19 1116 11/15/19 0216  WBC 9.9 10.5 17.5* 20.2*  NEUTROABS 6.8 5.2 14.6* 17.5*  HGB 8.4* 8.1* 10.0* 9.2*  HCT 25.4* 24.0* 30.4* 27.6*  MCV 69.4* 70.2* 72.0* 70.2*  PLT 455* 424* 438* 361   Basic Metabolic Panel: Recent Labs  Lab 11/09/19 0553 11/10/19 0332 11/14/19 1116 11/15/19 0216  NA  --   --  140 137  K  --   --  3.7 3.6  CL  --   --  104 102  CO2  --   --  25 25  GLUCOSE  --   --  119* 132*  BUN  --   --  <5* 5*  CREATININE  --   --  0.89 0.83  CALCIUM  --   --  8.6* 8.3*  MG 1.9 2.0  --  1.8   GFR: Estimated Creatinine Clearance: 87.5  mL/min (by C-G formula based on SCr of 0.83 mg/dL). Liver Function Tests: Recent Labs  Lab 11/14/19 1116 11/15/19 0216  AST 15 12*  ALT 10 9  ALKPHOS 61 55  BILITOT 1.0 1.3*  PROT 6.9 6.3*  ALBUMIN 2.5* 2.2*   No results for input(s): LIPASE, AMYLASE in the last 168 hours. No results for input(s): AMMONIA in the last 168 hours. Coagulation Profile: No results for input(s): INR, PROTIME in the last 168 hours. Cardiac Enzymes: No results for input(s): CKTOTAL, CKMB, CKMBINDEX, TROPONINI in the last 168 hours. BNP (last 3 results) No results for input(s): PROBNP in the last 8760 hours. HbA1C: No results for input(s): HGBA1C in the last 72 hours. CBG: No results for input(s): GLUCAP in the last 168 hours. Lipid Profile: No results for input(s): CHOL, HDL, LDLCALC, TRIG, CHOLHDL, LDLDIRECT in the last 72 hours. Thyroid Function Tests: No results for input(s): TSH, T4TOTAL, FREET4, T3FREE, THYROIDAB in the last 72 hours. Anemia Panel: No results for input(s): VITAMINB12, FOLATE, FERRITIN, TIBC, IRON, RETICCTPCT in the last 72 hours. Sepsis Labs: Recent Labs  Lab 11/14/19 1116  LATICACIDVEN 1.2    Recent Results (from the past 240 hour(s))  SARS Coronavirus 2 by RT PCR (hospital order, performed in [facility] hospital lab) Nasopharyngeal Nasopharyngeal Swab     Status: None   Collection Time: 11/14/19  9:47 PM   Specimen: Nasopharyngeal Swab  Result Value Ref Range Status   SARS Coronavirus 2 NEGATIVE NEGATIVE Final    Comment: (NOTE) SARS-CoV-2 target nucleic acids are NOT DETECTED.  The SARS-CoV-2 RNA is generally detectable in upper and lower respiratory specimens during the acute phase of infection. The lowest concentration of SARS-CoV-2 viral copies this assay can detect is 250 copies / mL. A negative result does not preclude SARS-CoV-2 infection and should not be used as the sole basis for treatment or other patient management decisions.  A negative result may  occur with improper specimen collection / handling, submission of specimen other than nasopharyngeal swab, presence of viral mutation(s) within the areas targeted by this assay, and inadequate number of viral copies (<250 copies / mL). A negative result must be combined with clinical observations, patient history, and epidemiological information.  Fact Sheet for Patients:   StrictlyIdeas.no  Fact Sheet for Healthcare Providers: BankingDealers.co.za  This test is not yet approved or  cleared by the [place] FDA and has been authorized for detection and/or diagnosis of SARS-CoV-2 by FDA under an Emergency Use Authorization (EUA).  This EUA  will remain in effect (meaning this test can be used) for the duration of the COVID-19 declaration under Section 564(b)(1) of the Act, 21 U.S.C. section 360bbb-3(b)(1), unless the authorization is terminated or revoked sooner.  Performed at [facility], [place] [address] [place], [place] [address]      RN Pressure Injury Documentation:     Estimated body mass index is 31.62 kg/m as calculated from the following:   Height as of this encounter: 5' 6.5" (1.689 m).   Weight as of this encounter: 90.2 kg.  Malnutrition Type:      Malnutrition Characteristics:      Nutrition Interventions:    Radiology Studies: CT ABDOMEN PELVIS W CONTRAST  Result Date: 11/14/2019 CLINICAL DATA:  Intra-abdominal abscess. JP drain not draining well, accumulation of drainage on the T-drain sponge. EXAM: CT ABDOMEN AND PELVIS WITH CONTRAST TECHNIQUE: Multidetector CT imaging of the abdomen and pelvis was performed using the standard protocol following bolus administration of intravenous contrast. CONTRAST:  151m OMNIPAQUE IOHEXOL 300 MG/ML  SOLN COMPARISON:  Multiple exams, including 11/02/2019 FINDINGS: Lower chest: Unremarkable Hepatobiliary: Heterogeneity in the gallbladder could reflect gallstones  or sludge. 0.7 by 0.4 cm hypodense lesion in the lateral segment left hepatic lobe on image 12/2 is technically too small to characterize although statistically likely to be a benign cyst or similar benign lesion. No significant biliary dilatation. Pancreas: Unremarkable Spleen: Unremarkable Adrenals/Urinary Tract: The kidneys and ureters appear unremarkable. There is an abscess along the upper margin of the urinary bladder as noted below. Stomach/Bowel: There is considerable descending and sigmoid colon diverticulosis. There is an apparent fistulous connection between the sigmoid colon and the abscess collection above the urinary bladder shown on image 95/5. There is also local inflammation in this vicinity such that active diverticulitis may be persisting. Likewise, cannot exclude a second fistulous connection between the proximal sigmoid colon and the abscess collection above the urinary bladder eccentric to the left on image 106/5. There are 2 dominant abscesses in this vicinity. The lesion just above the urinary bladder measures proximally 7.6 by 3.4 by 3.7 cm (volume = 50 cm^3), within this abscess there is coiled a pigtail drain is coiled. A second lesion is present in the subcutaneous tissues just anterior to the linea alba, and this second abscess also contains a pigtail catheter and is traversed by the first pigtail catheter is well. This second abscess measures 7.6 by 4.2 by 5.8 cm (volume = 97 cm^3) and contains fluid and gas. There is surrounding inflammatory phlegmon in the subcutaneous adipose tissues. Small amount of anterior pelvic ascites noted bilaterally, but without obvious enhancing margins to suggest that these represent additional abscesses. No discontinuity or obvious kinking of the intra-portions the drainage catheters identified. Vascular/Lymphatic: Unremarkable Reproductive: Uterus absent. Other: No supplemental non-categorized findings. Musculoskeletal: Multilevel facet arthropathy in  the lumbar spine, with suspected associated right foraminal impingement at L1-2 and L4-5, and left foraminal impingement at L1-2, L3-4, likely L4-5. IMPRESSION: 1. A 50 cubic cm abscess just above the urinary bladder has at least 1 and possibly 2 fistulous connections to the inflamed sigmoid colon. Descending and sigmoid colon diverticulosis. A pigtail catheter is present with loop formed within this abscess. 2. A 97 cubic cm abscess is present in the midline subcutaneous tissues of the anterior pelvis, with surrounding inflammatory stranding in the subcutaneous tissues indicating phlegmon. A pigtail catheter is present with loop formed within this abscess. The other pigtail catheter traverses this abscess to reach the abscess  above the urinary bladder. 3. Small amounts of ascites anteriorly in the pelvis, without marginal enhancement to suggest that these collections represent new abscesses. 4. Heterogeneity in the gallbladder could reflect gallstones or sludge. 5. Multilevel facet arthropathy in the lumbar spine with associated foraminal impingement. Electronically Signed   By: Van Clines M.D.   On: 11/14/2019 20:56   Scheduled Meds:  enoxaparin (LOVENOX) injection  40 mg Subcutaneous Q24H   ferrous sulfate  325 mg Oral BID WC   losartan  50 mg Oral Daily   saccharomyces boulardii  250 mg Oral BID   Continuous Infusions:  lactated ringers     Followed by   lactated ringers 125 mL/hr at 11/15/19 0147   piperacillin-tazobactam 3.375 g (11/15/19 1333)    LOS: 0 days   Kerney Elbe, DO [facility] PAGER is on AMION  If 7PM-7AM, please contact night-coverage [email or web address]

## 2019-11-15 NOTE — Progress Notes (Signed)
Referring Physician(s): Marinda Elk  Supervising Physician: Irish Lack  Patient Status:  [facility] - In-pt  Chief Complaint: "Drains leaking"  Subjective:  History of perforated diverticulitis with subsequent development of intra-abdominal abscesses x2 s/p RLQ and LLQ drain placements in IR 11/04/2019 by Dr. Loreta Ave. Patient awake and alert sitting in bed. Complains of copious drainage of drains at skin site (LLQ drain > RLQ drain) for the past few days. States saturates dressings. States she just changed her dressings- sites currently c/d/i. No additional complaints.  CT abdomen/pelvis 11/14/2019: 1. A 50 cubic cm abscess just above the urinary bladder has at least 1 and possibly 2 fistulous connections to the inflamed sigmoid colon. Descending and sigmoid colon diverticulosis. A pigtail catheter is present with loop formed within this abscess. 2. A 97 cubic cm abscess is present in the midline subcutaneous tissues of the anterior pelvis, with surrounding inflammatory stranding in the subcutaneous tissues indicating phlegmon. A pigtail catheter is present with loop formed within this abscess. The other pigtail catheter traverses this abscess to reach the abscess above the urinary bladder. 3. Small amounts of ascites anteriorly in the pelvis, without marginal enhancement to suggest that these collections represent new abscesses. 4. Heterogeneity in the gallbladder could reflect gallstones or sludge. 5. Multilevel facet arthropathy in the lumbar spine with associated foraminal impingement.   Allergies: Aspirin  Medications: Prior to Admission medications   Medication Sig Start Date End Date Taking? Authorizing Provider  ALPRAZolam Prudy Feeler) 0.5 MG tablet Take 0.5 mg by mouth 3 (three) times daily as needed for anxiety.   Yes [provider]  ferrous sulfate 325 (65 FE) MG tablet Take 1 tablet (325 mg total) by mouth 2 (two) times daily with a meal. 11/10/19 12/10/19 Yes Pahwani,  Daleen Bo, MD  hyoscyamine (LEVSIN) 0.125 MG tablet Take 0.125 mg by mouth every 6 (six) hours as needed for bladder spasms or cramping.  10/21/19  Yes [provider]  levofloxacin (LEVAQUIN) 500 MG tablet Take 1 tablet (500 mg total) by mouth daily for 25 days. 11/10/19 12/05/19 Yes Meuth, Lina Sar, PA-C  losartan-hydrochlorothiazide (HYZAAR) 50-12.5 MG per tablet Take 1 tablet by mouth daily.   Yes [provider]  metroNIDAZOLE (FLAGYL) 500 MG tablet Take 1 tablet (500 mg total) by mouth 3 (three) times daily for 25 days. 11/10/19 12/05/19 Yes Meuth, Lina Sar, PA-C  promethazine (PHENERGAN) 25 MG tablet Take 25 mg by mouth 2 (two) times daily as needed for nausea or vomiting.  10/21/19  Yes [provider]  ondansetron (ZOFRAN) 4 MG tablet Take 1 tablet (4 mg total) by mouth every 6 (six) hours as needed for nausea. Patient not taking: Reported on 11/14/2019 11/10/19   Carlena Bjornstad A, PA-C  oxyCODONE (OXY IR/ROXICODONE) 5 MG immediate release tablet Take 1 tablet (5 mg total) by mouth every 6 (six) hours as needed for moderate pain or severe pain. 11/10/19   Meuth, Lina Sar, PA-C  saccharomyces boulardii (FLORASTOR) 250 MG capsule Take 1 capsule (250 mg total) by mouth 2 (two) times daily. 11/10/19   Meuth, Lina Sar, PA-C     Vital Signs: BP 127/85 (BP Location: Left Arm)   Pulse (!) 108   Temp 98.8 F (37.1 C)   Resp 16   Ht 5' 6.5" (1.689 m)   Wt 198 lb 13.7 oz (90.2 kg)   SpO2 100%   BMI 31.62 kg/m   Physical Exam Vitals and nursing note reviewed.  Constitutional:  General: She is not in acute distress.    Appearance: Normal appearance.  Pulmonary:     Effort: Pulmonary effort is normal. No respiratory distress.  Abdominal:     Comments: RLQ and LLQ drain sites without tenderness, erythema, drainage, or active bleeding (patient states she just did a dressing change); both drains were flushed with 10 cc NS with vigorous return of feculent drainage into suction bulbs,  no drainage at skin sites noted with this- following this RLQ drain with 15 cc output in suction bulb and LLQ drain with 25 cc output in suction bulb.  Skin:    General: Skin is warm and dry.  Neurological:     Mental Status: She is alert and oriented to person, place, and time.     Imaging: CT ABDOMEN PELVIS W CONTRAST  Result Date: 11/14/2019 CLINICAL DATA:  Intra-abdominal abscess. JP drain not draining well, accumulation of drainage on the T-drain sponge. EXAM: CT ABDOMEN AND PELVIS WITH CONTRAST TECHNIQUE: Multidetector CT imaging of the abdomen and pelvis was performed using the standard protocol following bolus administration of intravenous contrast. CONTRAST:  OMNIPAQUE IOHEXOL 300 MG/ML  SOLN COMPARISON:  Multiple exams, including 11/02/2019 FINDINGS: Lower chest: Unremarkable Hepatobiliary: Heterogeneity in the gallbladder could reflect gallstones or sludge. 0.7 by 0.4 cm hypodense lesion in the lateral segment left hepatic lobe on image 12/2 is technically too small to characterize although statistically likely to be a benign cyst or similar benign lesion. No significant biliary dilatation. Pancreas: Unremarkable Spleen: Unremarkable Adrenals/Urinary Tract: The kidneys and ureters appear unremarkable. There is an abscess along the upper margin of the urinary bladder as noted below. Stomach/Bowel: There is considerable descending and sigmoid colon diverticulosis. There is an apparent fistulous connection between the sigmoid colon and the abscess collection above the urinary bladder shown on image 95/5. There is also local inflammation in this vicinity such that active diverticulitis may be persisting. Likewise, cannot exclude a second fistulous connection between the proximal sigmoid colon and the abscess collection above the urinary bladder eccentric to the left on image 106/5. There are 2 dominant abscesses in this vicinity. The lesion just above the urinary bladder measures proximally  7.6 by 3.4 by 3.7 cm (volume = 50 cm^3), within this abscess there is coiled a pigtail drain is coiled. A second lesion is present in the subcutaneous tissues just anterior to the linea alba, and this second abscess also contains a pigtail catheter and is traversed by the first pigtail catheter is well. This second abscess measures 7.6 by 4.2 by 5.8 cm (volume = 97 cm^3) and contains fluid and gas. There is surrounding inflammatory phlegmon in the subcutaneous adipose tissues. Small amount of anterior pelvic ascites noted bilaterally, but without obvious enhancing margins to suggest that these represent additional abscesses. No discontinuity or obvious kinking of the intra-portions the drainage catheters identified. Vascular/Lymphatic: Unremarkable Reproductive: Uterus absent. Other: No supplemental non-categorized findings. Musculoskeletal: Multilevel facet arthropathy in the lumbar spine, with suspected associated right foraminal impingement at L1-2 and L4-5, and left foraminal impingement at L1-2, L3-4, likely L4-5. IMPRESSION: 1. A 50 cubic cm abscess just above the urinary bladder has at least 1 and possibly 2 fistulous connections to the inflamed sigmoid colon. Descending and sigmoid colon diverticulosis. A pigtail catheter is present with loop formed within this abscess. 2. A 97 cubic cm abscess is present in the midline subcutaneous tissues of the anterior pelvis, with surrounding inflammatory stranding in the subcutaneous tissues indicating phlegmon. A pigtail catheter is  present with loop formed within this abscess. The other pigtail catheter traverses this abscess to reach the abscess above the urinary bladder. 3. Small amounts of ascites anteriorly in the pelvis, without marginal enhancement to suggest that these collections represent new abscesses. 4. Heterogeneity in the gallbladder could reflect gallstones or sludge. 5. Multilevel facet arthropathy in the lumbar spine with associated foraminal  impingement. Electronically Signed   By: Gaylyn Rong M.D.   On: 11/14/2019 20:56    Labs:  CBC: Recent Labs    11/09/19 0553 11/10/19 0332 11/14/19 1116 11/15/19 0216  WBC 9.9 10.5 17.5* 20.2*  HGB 8.4* 8.1* 10.0* 9.2*  HCT 25.4* 24.0* 30.4* 27.6*  PLT 455* 424* 438* 356    COAGS: Recent Labs    11/04/19 0428  INR 1.4*    BMP: Recent Labs    11/05/19 0433 11/06/19 0439 11/14/19 1116 11/15/19 0216  NA 134* 137 140 137  K 3.4* 3.6 3.7 3.6  CL 104 102 104 102  CO2 24 25 25 25   GLUCOSE 106* 101* 119* 132*  BUN 5* <5* <5* 5*  CALCIUM 7.9* 8.0* 8.6* 8.3*  CREATININE 0.65 0.62 0.89 0.83  GFRNONAA >60 >60 >60 >60  GFRAA >60 >60 >60 >60    LIVER FUNCTION TESTS: Recent Labs    11/02/19 0617 11/04/19 0428 11/14/19 1116 11/15/19 0216  BILITOT 1.7* 0.9 1.0 1.3*  AST 17 14* 15 12*  ALT 13 13 10 9   ALKPHOS 72 59 61 55  PROT 7.7 5.5* 6.9 6.3*  ALBUMIN 2.6* 1.7* 2.5* 2.2*    Assessment and Plan:  History of perforated diverticulitis with subsequent development of intra-abdominal abscesses x2 s/p RLQ and LLQ drain placements in IR 11/04/2019 by Dr. Loreta Ave. RLQ and LLQ drains stable, with approximately 15 cc and 25 cc (respectively) output of feculent output in suction bulbs. Patient reports copious drainage at drain insertion sites, LLQ drain > RLQ drain, at home. These drains were flushed by this PA with vigorous return of drain output into suction bulb and no leakage noted. Discussed with Dr. Fredia Sorrow who does not recommend drain exchanges at this time, recommend continue current drain management- continue with Qshift flushes/monitor of output; plan for repeat CT/possible drain injection when output <10 cc per drain per day (assess for possible removal). No plans for IR interventions at this time, will delete order. Aurea Graff, RN aware of above. Further plans per [facility]- appreciate and agree with management. IR to follow.   Electronically Signed: Elwin Mocha,  PA-C 11/15/2019, 9:42 AM   I spent a total of 35 Minutes at the the patient's bedside AND on the patient's hospital floor or unit, greater than 50% of which was counseling/coordinating care for intraabdominal abscess x2 s/p drain placement x2.

## 2019-11-16 ENCOUNTER — Other Ambulatory Visit: Payer: Self-pay

## 2019-11-16 DIAGNOSIS — A419 Sepsis, unspecified organism: Secondary | ICD-10-CM | POA: Diagnosis not present

## 2019-11-16 DIAGNOSIS — D638 Anemia in other chronic diseases classified elsewhere: Secondary | ICD-10-CM | POA: Diagnosis not present

## 2019-11-16 DIAGNOSIS — I10 Essential (primary) hypertension: Secondary | ICD-10-CM | POA: Diagnosis not present

## 2019-11-16 DIAGNOSIS — K5720 Diverticulitis of large intestine with perforation and abscess without bleeding: Secondary | ICD-10-CM | POA: Diagnosis not present

## 2019-11-16 LAB — CBC WITH DIFFERENTIAL/PLATELET
Abs Immature Granulocytes: 0.05 10*3/uL (ref 0.00–0.07)
Abs Immature Granulocytes: 0.05 10*3/uL (ref 0.00–0.07)
Basophils Absolute: 0 10*3/uL (ref 0.0–0.1)
Basophils Absolute: 0.1 10*3/uL (ref 0.0–0.1)
Basophils Relative: 0 %
Basophils Relative: 1 %
Eosinophils Absolute: 0.1 10*3/uL (ref 0.0–0.5)
Eosinophils Absolute: 0.1 10*3/uL (ref 0.0–0.5)
Eosinophils Relative: 1 %
Eosinophils Relative: 1 %
HCT: 24.1 % — ABNORMAL LOW (ref 36.0–46.0)
HCT: 24.7 % — ABNORMAL LOW (ref 36.0–46.0)
Hemoglobin: 8.2 g/dL — ABNORMAL LOW (ref 12.0–15.0)
Hemoglobin: 8.3 g/dL — ABNORMAL LOW (ref 12.0–15.0)
Immature Granulocytes: 1 %
Immature Granulocytes: 1 %
Lymphocytes Relative: 22 %
Lymphocytes Relative: 24 %
Lymphs Abs: 2.3 10*3/uL (ref 0.7–4.0)
Lymphs Abs: 2.2 10*3/uL (ref 0.7–4.0)
MCH: 24.1 pg — ABNORMAL LOW (ref 26.0–34.0)
MCH: 23.7 pg — ABNORMAL LOW (ref 26.0–34.0)
MCHC: 34 g/dL (ref 30.0–36.0)
MCHC: 33.6 g/dL (ref 30.0–36.0)
MCV: 70.9 fL — ABNORMAL LOW (ref 80.0–100.0)
MCV: 70.6 fL — ABNORMAL LOW (ref 80.0–100.0)
Monocytes Absolute: 0.8 10*3/uL (ref 0.1–1.0)
Monocytes Absolute: 0.8 10*3/uL (ref 0.1–1.0)
Monocytes Relative: 7 %
Monocytes Relative: 9 %
Neutro Abs: 7.4 10*3/uL (ref 1.7–7.7)
Neutro Abs: 6.2 10*3/uL (ref 1.7–7.7)
Neutrophils Relative %: 69 %
Neutrophils Relative %: 64 %
Platelets: 342 10*3/uL (ref 150–400)
Platelets: 345 10*3/uL (ref 150–400)
RBC: 3.4 MIL/uL — ABNORMAL LOW (ref 3.87–5.11)
RBC: 3.5 MIL/uL — ABNORMAL LOW (ref 3.87–5.11)
RDW: 19.2 % — ABNORMAL HIGH (ref 11.5–15.5)
RDW: 18.8 % — ABNORMAL HIGH (ref 11.5–15.5)
WBC: 10.6 10*3/uL — ABNORMAL HIGH (ref 4.0–10.5)
WBC: 9.4 10*3/uL (ref 4.0–10.5)
nRBC: 0 % (ref 0.0–0.2)
nRBC: 0 % (ref 0.0–0.2)

## 2019-11-16 LAB — COMPREHENSIVE METABOLIC PANEL
ALT: 8 U/L (ref 0–44)
ALT: 7 U/L (ref 0–44)
AST: 12 U/L — ABNORMAL LOW (ref 15–41)
AST: 10 U/L — ABNORMAL LOW (ref 15–41)
Albumin: 2 g/dL — ABNORMAL LOW (ref 3.5–5.0)
Albumin: 2 g/dL — ABNORMAL LOW (ref 3.5–5.0)
Alkaline Phosphatase: 51 U/L (ref 38–126)
Alkaline Phosphatase: 50 U/L (ref 38–126)
Anion gap: 7 (ref 5–15)
Anion gap: 8 (ref 5–15)
BUN: 6 mg/dL (ref 6–20)
BUN: <5 mg/dL — ABNORMAL LOW (ref 6–20)
CO2: 28 mmol/L (ref 22–32)
CO2: 27 mmol/L (ref 22–32)
Calcium: 8.2 mg/dL — ABNORMAL LOW (ref 8.9–10.3)
Calcium: 8.1 mg/dL — ABNORMAL LOW (ref 8.9–10.3)
Chloride: 104 mmol/L (ref 98–111)
Chloride: 102 mmol/L (ref 98–111)
Creatinine, Ser: 0.7 mg/dL (ref 0.44–1.00)
Creatinine, Ser: 0.62 mg/dL (ref 0.44–1.00)
GFR calc Af Amer: >60 mL/min (ref >60)
GFR calc Af Amer: >60 mL/min (ref >60)
GFR calc non Af Amer: >60 mL/min (ref >60)
GFR calc non Af Amer: >60 mL/min (ref >60)
Glucose, Bld: 83 mg/dL (ref 70–99)
Glucose, Bld: 93 mg/dL (ref 70–99)
Potassium: 4 mmol/L (ref 3.5–5.1)
Potassium: 3.5 mmol/L (ref 3.5–5.1)
Sodium: 139 mmol/L (ref 135–145)
Sodium: 137 mmol/L (ref 135–145)
Total Bilirubin: 0.7 mg/dL (ref 0.3–1.2)
Total Bilirubin: 0.8 mg/dL (ref 0.3–1.2)
Total Protein: 5.6 g/dL — ABNORMAL LOW (ref 6.5–8.1)
Total Protein: 5.5 g/dL — ABNORMAL LOW (ref 6.5–8.1)

## 2019-11-16 LAB — MAGNESIUM
Magnesium: 2 mg/dL (ref 1.7–2.4)
Magnesium: 2 mg/dL (ref 1.7–2.4)

## 2019-11-16 LAB — PHOSPHORUS
Phosphorus: 3.5 mg/dL (ref 2.5–4.6)
Phosphorus: 3.3 mg/dL (ref 2.5–4.6)

## 2019-11-16 LAB — HEMOGLOBIN A1C
Hgb A1c MFr Bld: 5.5 % (ref 4.8–5.6)
Mean Plasma Glucose: 111.15 mg/dL

## 2019-11-16 NOTE — Progress Notes (Signed)
PROGRESS NOTE    [name]  QMG:[id number] DOB: 11/28/1965 DOA: 11/14/2019 PCP: Kristen Loader, FNP   Brief Narrative:  HPI per Dr. Inda Merlin on 11/14/19 54 year old female with past medical history of hypertension, diverticulosis and frequent bouts of diverticulitis over at least the past year who presents to [place] emergency department due to complaints of persisting lower abdominal pain after recent diagnosis of complicated diverticulitis.  Of note, patient was recently hospitalized at [facility] from 8/9-1/6 for complicated diverticulitis with severe sepsis after failing outpatient antibiotic therapy.  Diverticulitis was complicated by multiple abdominal abscesses that ended up growing out E. coli, Pseudomonas and Streptococcus.  Blood cultures remain negative throughout the hospitalization.  Patient received intravenous Zosyn throughout the hospitalization.  Interventional radiology and general surgery were consulted and the decision was made to place drains in to the abscesses in the patient's abdomen.  General surgery recommended going home on oral antibiotic therapy on a full liquid diet with outpatient follow-up.  Patient was eventually discharged home on levofloxacin and Flagyl on 8/9.  Unfortunately, patient reports a delay in starting her oral antibiotic therapy due to her pharmacy not having IV antibiotics in stock.  Patient did not start antibiotic therapy until 8/11.  Over the span of time, patient states that the drainage coming from both sites turned from a cream color to brown and became more and more foul-smelling.  Additionally, patient began to experience drainage around the drain insertion site as well.  Patient continued to experience lower abdominal pain that she describes as mild to moderate intensity, sharp in quality, worse with movement and nonradiating.  Pain continue to persist for several days.  Patient denies any associated fevers,  weakness, changes in appetite or diarrhea.  Patient eventually presented to [facility] emergency department for evaluation.  Upon evaluation in the emergency department patient was found to have a worsening leukocytosis and other SIRS criteria including tachycardia.  CT imaging of the abdomen was performed revealing redemonstration of multiple large abscesses with drainage catheters seemingly in place with additional small amounts of ascites noted anteriorly in the pelvis concerning for additional new abscesses.  One of the abscesses appeared to be developing fistulous connections to the inflamed sigmoid colon.  Case was discussed with Dr.Tsuei with general surgery who recommended admission by the hospitalist group with IR consultation for possible replacement of the drains which he feels are likely clogged.  Patient was initiated on intravenous Zosyn.  The hospitalist group was then called to assess the patient for admission to the hospital.  **Interim History General surgery and interventional radiology evaluated.  Drains were flushed and general surgery recommends continue clear liquid diet again today.  Interventional radiology does not recommend replacing the drains at this time. Patient's WBC is trending down   Assessment & Plan:   Principal Problem:   Diverticulitis of large intestine with abscess Active Problems:   Essential hypertension   Anemia, chronic disease  Diverticulitis of large intestine with Diverticular Abscess s/p 2 Percutaneous Drains Sepsis, poA 2/2 to Above -Patient presenting with extremely complicated diverticulitis of the sigmoid colon with development of multiple large abscesses with concern for clinical worsening since her recent discharge.   -Additionally, there is concern for developing fistulous to the sigmoid colon which would likely explain the feculent appearing material and foul smell on examination. -This interval worsening is in part likely  secondary to the significant delay in initiating oral antibiotic therapy in the outpatient setting. -While  both catheters seem to be inserted in each of the large abscess sites, general surgery (Dr. Georgette Dover) feels that they are likely clogged and is requesting that IR be reconsulted for replacement of these catheters/drains. -She was placed on intravenous Zosyn and will continue for now. -Hydrating patient with intravenous isotonic fluids; she is given a bolus of lactated Ringer's 1 L and then started on lactated Ringer infusion of 125 mL's per hour for 16 hours -Patient exhibited multiple SIRS criteria and met sepsis Criteriabut does not reveal any evidence of organ dysfunction to suggest underlying severe sepsis at this time; her heart rate was elevated to 140 and her respiratory rate was elevated (>20) on admission along with elevated WBC on admission sepsis patient had a heart rate of 141 respiratory rate of 24 WBC is 17.5 and she came in with diverticular abscess blocked drain -WBC worsened and went from 10.5 on the day of discharge last week on 11/10/2019 and worsened to 17.5 on admission to 20.2; Now WBC is 10.6 -Blood cultures and drainage cultures have been ordered. -Of note, previous drainage culture on 8/3 grew E. coli, Pseudomonas Aeruginosa and Streptococcus Constellatus -General surgery stated that they will continue to follow, the recommendations are appreciated.   -General surgery recommending the patient be on a clear liquid diet for now but would not advance this beyond until her WBC starts to decrease; 11/16/19 they recommend continuing  -Interventional radiology was consulted and they gave the patient and nursing instructions for drain management and they do not recommend drain exchange at this time and recommend continue current drain management with every shift flushes and monitoring output.  They plan for repeat CT/possible drain injection 1 output less than 10 cc/day -As needed  analgesics with tramadol 50 mg p.o. every 6 as needed for moderate pain and 100 mg p.o. every 6 hours as needed for severe pain for associated abdominal pain; also continue with hyoscyamine 0.125 mg p.o. every 6 hours bladder spasms and abdominal cramping -Continue supportive care and continue with antiemetics with p.o./IV Zofran -Continue with bowel regimen with MiraLAX  Essential Hypertension -Continue home regimen of antihypertensive therapy with losartan 50 mg p.o. daily -BP at goal and is 108/74  Anemia of chronic disease/Microcytic Anemia -Recent iron panel suggestive of anemia of chronic disease, discharging provider during last hospitalization feels there is likely some superimposed iron deficiency as well.  -Patient's hemoglobin/hematocrit went from 10.0/30.4 -> 9.2/27.6 -> 8.2/24.1 with an MCV of 70.9 today -Likely dilutional Drop as she was getting IVF; (Now stopped) -We will continue previously prescribed regimen of oral iron supplementation with ferrous sulfate 325 mg p.o. twice daily. -Monitoring hemoglobin and hematocrit with serial CBCs -Continue to monitor for signs and symptoms of bleeding; currently no overt bleeding noted   Anxiety -Continue with Alprazolam 0.5 mg p.o. 3 times daily as needed for anxiety  Hyperbilirubinemia -Patient's T bili was slightly elevated and went to 1.3; Now it is 0.7 -continue to monitor and trend and repeat CMP in the a.m.  Hyperglycemia -Patient's blood sugar was elevated on admission at 119 and repeat this morning was 132 -Check hemoglobin A1c and HbA1c was 5.5 -Continue monitor and trend blood sugars carefully and if necessary will place on a sensitive NovoLog sign scale insulin AC  Obesity -Estimated body mass index is 31.62 kg/m as calculated from the following:   Height as of this encounter: 5' 6.5" (1.689 m).   Weight as of this encounter: 90.2 kg. -Weight Loss and Dietary Counseling  given  DVT prophylaxis: Enoxaparin 40 mg  sq q24h Code Status: FULL CODE Family Communication: No family present at bedside Disposition Plan: Pending advancement of her diet and tolerance of her diet as well as clearance by general surgery and interventional radiology  Status is: Inpatient  Remains inpatient appropriate because:IV treatments appropriate due to intensity of illness or inability to take PO and Inpatient level of care appropriate due to severity of illness   Dispo: The patient is from: Home              Anticipated d/c is to: Home              Anticipated d/c date is: 1 day              Patient currently is not medically stable to d/c.   Consultants:   General Surgery  Interventional Radiology    Procedures: None  Antimicrobials:  Anti-infectives (From admission, onward)   Start     Dose/Rate Route Frequency Ordered Stop   11/15/19 0400  piperacillin-tazobactam (ZOSYN) IVPB 3.375 g     Discontinue     3.375 g 12.5 mL/hr over 240 Minutes Intravenous Every 8 hours 11/15/19 0119     11/14/19 2130  piperacillin-tazobactam (ZOSYN) IVPB 3.375 g        3.375 g 100 mL/hr over 30 Minutes Intravenous  Once 11/14/19 2116 11/14/19 2250     Subjective: Seen and examined at bedside and she states that she still has not had a very large bowel movement and had a very small 1 yesterday.  No nausea or vomiting and thinks she is doing a little bit better.  Happy to know that her WBC is trending down.  No other concerns or complaints at this time and general surgery recommends continuing her on a clear liquid diet today.  Objective: Vitals:   11/15/19 0919 11/15/19 1347 11/15/19 2037 11/16/19 0458  BP: 127/85 111/71 125/82 108/74  Pulse: (!) 108 (!) 101 (!) 108 99  Resp: _0 Temp: 98.8 F (37.1 C) 98.8 F (37.1 C) 99.2 F (37.3 C) 97.7 F (36.5 C)  TempSrc:  Oral Oral Oral  SpO2: 100% 100% 99% 100%  Weight:      Height:        Intake/Output Summary (Last 24 hours) at 11/16/2019 1304 Last data filed  at 11/16/2019 0500 Gross per 24 hour  Intake 2773.36 ml  Output 78 ml  Net 2695.36 ml   Filed Weights   11/14/19 1053 11/15/19 0100  Weight: 96.6 kg 90.2 kg   Examination: Physical Exam:  Constitutional: WN/WD African-American female currently no acute distress appears calm Eyes: Lids and conjunctivae normal, sclerae anicteric  ENMT: External Ears, Nose appear normal. Grossly normal hearing.  Neck: Appears normal, supple, no cervical masses, normal ROM, no appreciable thyromegaly; no JVD Respiratory: Diminished to auscultation bilaterally, no wheezing, rales, rhonchi or crackles. Normal respiratory effort and patient is not tachypenic. No accessory muscle use. Unlabored Breathing  Cardiovascular: RRR, no murmurs / rubs / gallops. S1 and S2 auscultated. No extremity edema.  Abdomen: Soft, non-tender, Distended 2/2 body habitus. Drains in place Bowel sounds positive.  GU: Deferred. Musculoskeletal: No clubbing / cyanosis of digits/nails. No joint deformity upper and lower extremities.   Skin: No rashes, lesions, ulcers on a limited skin evaluation. No induration; Warm and dry.  Neurologic: CN 2-12 grossly intact with no focal deficits. Romberg sign and cerebellar reflexes not assessed.  Psychiatric: Normal judgment  and insight. Alert and oriented x 3. Normal mood and appropriate affect.   Data Reviewed: I have personally reviewed following labs and imaging studies  CBC: Recent Labs  Lab 11/10/19 0332 11/14/19 1116 11/15/19 0216 11/16/19 0225  WBC 10.5 17.5* 20.2* 10.6*  NEUTROABS 5.2 14.6* 17.5* 7.4  HGB 8.1* 10.0* 9.2* 8.2*  HCT 24.0* 30.4* 27.6* 24.1*  MCV 70.2* 72.0* 70.2* 70.9*  PLT 424* 438* 356 976   Basic Metabolic Panel: Recent Labs  Lab 11/10/19 0332 11/14/19 1116 11/15/19 0216 11/16/19 0225  NA  --  140 137 139  K  --  3.7 3.6 4.0  CL  --  104 102 104  CO2  --  _0 GLUCOSE  --  119* 132* 83  BUN  --  <5* 5* 6  CREATININE  --  0.89 0.83 0.70    CALCIUM  --  8.6* 8.3* 8.2*  MG 2.0  --  1.8 2.0  PHOS  --   --   --  3.5   GFR: Estimated Creatinine Clearance: 90.8 mL/min (by C-G formula based on SCr of 0.7 mg/dL). Liver Function Tests: Recent Labs  Lab 11/14/19 1116 11/15/19 0216 11/16/19 0225  AST 15 12* 12*  ALT _1 ALKPHOS 61 55 51  BILITOT 1.0 1.3* 0.7  PROT 6.9 6.3* 5.6*  ALBUMIN 2.5* 2.2* 2.0*   No results for input(s): LIPASE, AMYLASE in the last 168 hours. No results for input(s): AMMONIA in the last 168 hours. Coagulation Profile: No results for input(s): INR, PROTIME in the last 168 hours. Cardiac Enzymes: No results for input(s): CKTOTAL, CKMB, CKMBINDEX, TROPONINI in the last 168 hours. BNP (last 3 results) No results for input(s): PROBNP in the last 8760 hours. HbA1C: Recent Labs    11/16/19 0225  HGBA1C 5.5   CBG: No results for input(s): GLUCAP in the last 168 hours. Lipid Profile: No results for input(s): CHOL, HDL, LDLCALC, TRIG, CHOLHDL, LDLDIRECT in the last 72 hours. Thyroid Function Tests: No results for input(s): TSH, T4TOTAL, FREET4, T3FREE, THYROIDAB in the last 72 hours. Anemia Panel: No results for input(s): VITAMINB12, FOLATE, FERRITIN, TIBC, IRON, RETICCTPCT in the last 72 hours. Sepsis Labs: Recent Labs  Lab 11/14/19 1116  LATICACIDVEN 1.2    Recent Results (from the past 240 hour(s))  SARS Coronavirus 2 by RT PCR (hospital order, performed in [facility] hospital lab) Nasopharyngeal Nasopharyngeal Swab     Status: None   Collection Time: 11/14/19  9:47 PM   Specimen: Nasopharyngeal Swab  Result Value Ref Range Status   SARS Coronavirus 2 NEGATIVE NEGATIVE Final    Comment: (NOTE) SARS-CoV-2 target nucleic acids are NOT DETECTED.  The SARS-CoV-2 RNA is generally detectable in upper and lower respiratory specimens during the acute phase of infection. The lowest concentration of SARS-CoV-2 viral copies this assay can detect is 250 copies / mL. A negative result does  not preclude SARS-CoV-2 infection and should not be used as the sole basis for treatment or other patient management decisions.  A negative result may occur with improper specimen collection / handling, submission of specimen other than nasopharyngeal swab, presence of viral mutation(s) within the areas targeted by this assay, and inadequate number of viral copies (<250 copies / mL). A negative result must be combined with clinical observations, patient history, and epidemiological information.  Fact Sheet for Patients:   StrictlyIdeas.no  Fact Sheet for Healthcare Providers: BankingDealers.co.za  This test is not yet approved or  cleared by the [place] and has been authorized for detection and/or diagnosis of SARS-CoV-2 by FDA under an Emergency Use Authorization (EUA).  This EUA will remain in effect (meaning this test can be used) for the duration of the COVID-19 declaration under Section 564(b)(1) of the Act, 21 U.S.C. section 360bbb-3(b)(1), unless the authorization is terminated or revoked sooner.  Performed at [facility], [place] [address] [place], [place] [address]   Culture, blood (routine x 2)     Status: None (Preliminary result)   Collection Time: 11/15/19  2:10 AM   Specimen: BLOOD  Result Value Ref Range Status   Specimen Description BLOOD LEFT ANTECUBITAL  Final   Special Requests   Final    BOTTLES DRAWN AEROBIC AND ANAEROBIC Blood Culture adequate volume   Culture   Final    NO GROWTH 1 DAY Performed at [facility], [place] [address], [place], [place] [address]    Report Status PENDING  Incomplete  Culture, blood (routine x 2)     Status: None (Preliminary result)   Collection Time: 11/15/19  2:17 AM   Specimen: BLOOD LEFT HAND  Result Value Ref Range Status   Specimen Description BLOOD LEFT HAND  Final   Special Requests AEROBIC BOTTLE ONLY Blood Culture adequate volume  Final   Culture    Final    NO GROWTH 1 DAY Performed at [facility], [place] [address] [place], [place] [address]    Report Status PENDING  Incomplete     RN Pressure Injury Documentation:     Estimated body mass index is 31.62 kg/m as calculated from the following:   Height as of this encounter: 5' 6.5" (1.689 m).   Weight as of this encounter: 90.2 kg.  Malnutrition Type:      Malnutrition Characteristics:      Nutrition Interventions:    Radiology Studies: CT ABDOMEN PELVIS W CONTRAST  Result Date: 11/14/2019 CLINICAL DATA:  Intra-abdominal abscess. JP drain not draining well, accumulation of drainage on the T-drain sponge. EXAM: CT ABDOMEN AND PELVIS WITH CONTRAST TECHNIQUE: Multidetector CT imaging of the abdomen and pelvis was performed using the standard protocol following bolus administration of intravenous contrast. CONTRAST:  163m OMNIPAQUE IOHEXOL 300 MG/ML  SOLN COMPARISON:  Multiple exams, including 11/02/2019 FINDINGS: Lower chest: Unremarkable Hepatobiliary: Heterogeneity in the gallbladder could reflect gallstones or sludge. 0.7 by 0.4 cm hypodense lesion in the lateral segment left hepatic lobe on image 12/2 is technically too small to characterize although statistically likely to be a benign cyst or similar benign lesion. No significant biliary dilatation. Pancreas: Unremarkable Spleen: Unremarkable Adrenals/Urinary Tract: The kidneys and ureters appear unremarkable. There is an abscess along the upper margin of the urinary bladder as noted below. Stomach/Bowel: There is considerable descending and sigmoid colon diverticulosis. There is an apparent fistulous connection between the sigmoid colon and the abscess collection above the urinary bladder shown on image 95/5. There is also local inflammation in this vicinity such that active diverticulitis may be persisting. Likewise, cannot exclude a second fistulous connection between the proximal sigmoid colon and the abscess  collection above the urinary bladder eccentric to the left on image 106/5. There are 2 dominant abscesses in this vicinity. The lesion just above the urinary bladder measures proximally 7.6 by 3.4 by 3.7 cm (volume = 50 cm^3), within this abscess there is coiled a pigtail drain is coiled. A second lesion is present in the subcutaneous tissues just anterior to  the linea alba, and this second abscess also contains a pigtail catheter and is traversed by the first pigtail catheter is well. This second abscess measures 7.6 by 4.2 by 5.8 cm (volume = 97 cm^3) and contains fluid and gas. There is surrounding inflammatory phlegmon in the subcutaneous adipose tissues. Small amount of anterior pelvic ascites noted bilaterally, but without obvious enhancing margins to suggest that these represent additional abscesses. No discontinuity or obvious kinking of the intra-portions the drainage catheters identified. Vascular/Lymphatic: Unremarkable Reproductive: Uterus absent. Other: No supplemental non-categorized findings. Musculoskeletal: Multilevel facet arthropathy in the lumbar spine, with suspected associated right foraminal impingement at L1-2 and L4-5, and left foraminal impingement at L1-2, L3-4, likely L4-5. IMPRESSION: 1. A 50 cubic cm abscess just above the urinary bladder has at least 1 and possibly 2 fistulous connections to the inflamed sigmoid colon. Descending and sigmoid colon diverticulosis. A pigtail catheter is present with loop formed within this abscess. 2. A 97 cubic cm abscess is present in the midline subcutaneous tissues of the anterior pelvis, with surrounding inflammatory stranding in the subcutaneous tissues indicating phlegmon. A pigtail catheter is present with loop formed within this abscess. The other pigtail catheter traverses this abscess to reach the abscess above the urinary bladder. 3. Small amounts of ascites anteriorly in the pelvis, without marginal enhancement to suggest that these  collections represent new abscesses. 4. Heterogeneity in the gallbladder could reflect gallstones or sludge. 5. Multilevel facet arthropathy in the lumbar spine with associated foraminal impingement. Electronically Signed   By: Van Clines M.D.   On: 11/14/2019 20:56   Scheduled Meds: . enoxaparin (LOVENOX) injection  40 mg Subcutaneous Q24H  . ferrous sulfate  325 mg Oral BID WC  . losartan  50 mg Oral Daily  . saccharomyces boulardii  250 mg Oral BID   Continuous Infusions: . lactated ringers    . piperacillin-tazobactam 3.375 g (11/16/19 0551)    LOS: 1 day   Kerney Elbe, DO [facility] PAGER is on [place]  If 7PM-7AM, please contact night-coverage [email or web address]

## 2019-11-16 NOTE — Progress Notes (Addendum)
[facility]:  [phone] General Surgery Progress Note   LOS: 1 day  POD -     Assessment and Plan: 1.  Diverticular abscess  IR drainage x 2 on 8/3 (note one abscess is extraabdominal in subcutaneous tissue)  WBC - 10,600 - 11/16/2019  Zosyn - 8/13 >>>  Looks better - will leave on clear liquids for at least one more day  2.  HTN 3.  Anemia  Hgb - 8.2 - 11/16/2019 5.  DVT prophylaxis - will start Lovenox   Principal Problem:   Diverticulitis of large intestine with abscess Active Problems:   Essential hypertension   Anemia, chronic disease  Subjective:  Feels better today.  She will do her own dressing changes.  Objective:   Vitals:   11/15/19 2037 11/16/19 0458  BP: 125/82 108/74  Pulse: (!) 108 99  Resp: 17 17  Temp: 99.2 F (37.3 C) 97.7 F (36.5 C)  SpO2: 99% 100%     Intake/Output from previous day:  08/14 0701 - 08/15 0700 In: 2783.4 [P.O.:930; I.V.:1743.4; IV Piggyback:100] Out: 118 [Drains:118]  Intake/Output this shift:  No intake/output data recorded.   Physical Exam:   General: Obese AA F who is alert and oriented.    HEENT: Normal. Pupils equal. .   Lungs: Clear   Abdomen: Soft.  Has two drains in the lower midline.  Purulence in drains.  There is drainage around the left drain. Left drain - 78 cc, right drain - 40 cc last 24 hours   Lab Results:    Recent Labs    11/15/19 0216 11/16/19 0225  WBC 20.2* 10.6*  HGB 9.2* 8.2*  HCT 27.6* 24.1*  PLT 356 342    BMET   Recent Labs    11/15/19 0216 11/16/19 0225  NA 137 139  K 3.6 4.0  CL 102 104  CO2 25 28  GLUCOSE 132* 83  BUN 5* 6  CREATININE 0.83 0.70  CALCIUM 8.3* 8.2*    PT/INR  No results for input(s): LABPROT, INR in the last 72 hours.  ABG  No results for input(s): PHART, HCO3 in the last 72 hours.  Invalid input(s): PCO2, PO2   Studies/Results:  CT ABDOMEN PELVIS W CONTRAST  Result Date: 11/14/2019 CLINICAL DATA:  Intra-abdominal abscess. JP  drain not draining well, accumulation of drainage on the T-drain sponge. EXAM: CT ABDOMEN AND PELVIS WITH CONTRAST TECHNIQUE: Multidetector CT imaging of the abdomen and pelvis was performed using the standard protocol following bolus administration of intravenous contrast. CONTRAST:  OMNIPAQUE IOHEXOL 300 MG/ML  SOLN COMPARISON:  Multiple exams, including 11/02/2019 FINDINGS: Lower chest: Unremarkable Hepatobiliary: Heterogeneity in the gallbladder could reflect gallstones or sludge. 0.7 by 0.4 cm hypodense lesion in the lateral segment left hepatic lobe on image 12/2 is technically too small to characterize although statistically likely to be a benign cyst or similar benign lesion. No significant biliary dilatation. Pancreas: Unremarkable Spleen: Unremarkable Adrenals/Urinary Tract: The kidneys and ureters appear unremarkable. There is an abscess along the upper margin of the urinary bladder as noted below. Stomach/Bowel: There is considerable descending and sigmoid colon diverticulosis. There is an apparent fistulous connection between the sigmoid colon and the abscess collection above the urinary bladder shown on image 95/5. There is also local inflammation in this vicinity such that active diverticulitis may be persisting. Likewise, cannot exclude a second fistulous connection between the proximal sigmoid colon and the abscess collection above the urinary bladder eccentric to the left on  image 106/5. There are 2 dominant abscesses in this vicinity. The lesion just above the urinary bladder measures proximally 7.6 by 3.4 by 3.7 cm (volume = 50 cm^3), within this abscess there is coiled a pigtail drain is coiled. A second lesion is present in the subcutaneous tissues just anterior to the linea alba, and this second abscess also contains a pigtail catheter and is traversed by the first pigtail catheter is well. This second abscess measures 7.6 by 4.2 by 5.8 cm (volume = 97 cm^3) and contains fluid and gas.  There is surrounding inflammatory phlegmon in the subcutaneous adipose tissues. Small amount of anterior pelvic ascites noted bilaterally, but without obvious enhancing margins to suggest that these represent additional abscesses. No discontinuity or obvious kinking of the intra-portions the drainage catheters identified. Vascular/Lymphatic: Unremarkable Reproductive: Uterus absent. Other: No supplemental non-categorized findings. Musculoskeletal: Multilevel facet arthropathy in the lumbar spine, with suspected associated right foraminal impingement at L1-2 and L4-5, and left foraminal impingement at L1-2, L3-4, likely L4-5. IMPRESSION: 1. A 50 cubic cm abscess just above the urinary bladder has at least 1 and possibly 2 fistulous connections to the inflamed sigmoid colon. Descending and sigmoid colon diverticulosis. A pigtail catheter is present with loop formed within this abscess. 2. A 97 cubic cm abscess is present in the midline subcutaneous tissues of the anterior pelvis, with surrounding inflammatory stranding in the subcutaneous tissues indicating phlegmon. A pigtail catheter is present with loop formed within this abscess. The other pigtail catheter traverses this abscess to reach the abscess above the urinary bladder. 3. Small amounts of ascites anteriorly in the pelvis, without marginal enhancement to suggest that these collections represent new abscesses. 4. Heterogeneity in the gallbladder could reflect gallstones or sludge. 5. Multilevel facet arthropathy in the lumbar spine with associated foraminal impingement. Electronically Signed   By: Gaylyn Rong M.D.   On: 11/14/2019 20:56     Anti-infectives:   Anti-infectives (From admission, onward)   Start     Dose/Rate Route Frequency Ordered Stop   11/15/19 0400  piperacillin-tazobactam (ZOSYN) IVPB 3.375 g     Discontinue     3.375 g 12.5 mL/hr over 240 Minutes Intravenous Every 8 hours 11/15/19 0119     11/14/19 2130   piperacillin-tazobactam (ZOSYN) IVPB 3.375 g        3.375 g 100 mL/hr over 30 Minutes Intravenous  Once 11/14/19 2116 11/14/19 2250      Ovidio Kin, MD, [facility] Office: [phone] 11/16/2019

## 2019-11-16 NOTE — Plan of Care (Signed)
  Problem: Education: Goal: Knowledge of General Education information will improve Description: Including pain rating scale, medication(s)/side effects and non-pharmacologic comfort measures 11/16/2019 2108 by Dahlia Bailiff, RN Outcome: Adequate for Discharge 11/16/2019 2108 by Dahlia Bailiff, RN Outcome: Adequate for Discharge   Problem: Health Behavior/Discharge Planning: Goal: Ability to manage health-related needs will improve 11/16/2019 2108 by Dahlia Bailiff, RN Outcome: Adequate for Discharge 11/16/2019 2108 by Dahlia Bailiff, RN Outcome: Adequate for Discharge   Problem: Clinical Measurements: Goal: Ability to maintain clinical measurements within normal limits will improve 11/16/2019 2108 by Dahlia Bailiff, RN Outcome: Adequate for Discharge 11/16/2019 2108 by Dahlia Bailiff, RN Outcome: Adequate for Discharge Goal: Will remain free from infection Outcome: Adequate for Discharge Goal: Diagnostic test results will improve Outcome: Adequate for Discharge Goal: Respiratory complications will improve Outcome: Adequate for Discharge Goal: Cardiovascular complication will be avoided Outcome: Adequate for Discharge   Problem: Activity: Goal: Risk for activity intolerance will decrease Outcome: Adequate for Discharge   Problem: Nutrition: Goal: Adequate nutrition will be maintained Outcome: Adequate for Discharge   Problem: Coping: Goal: Level of anxiety will decrease Outcome: Adequate for Discharge   Problem: Elimination: Goal: Will not experience complications related to bowel motility Outcome: Adequate for Discharge Goal: Will not experience complications related to urinary retention Outcome: Adequate for Discharge   Problem: Pain Managment: Goal: General experience of comfort will improve Outcome: Adequate for Discharge   Problem: Safety: Goal: Ability to remain free from injury will improve Outcome: Adequate for Discharge   Problem: Skin  Integrity: Goal: Risk for impaired skin integrity will decrease Outcome: Adequate for Discharge

## 2019-11-17 DIAGNOSIS — D638 Anemia in other chronic diseases classified elsewhere: Secondary | ICD-10-CM | POA: Diagnosis not present

## 2019-11-17 DIAGNOSIS — K5720 Diverticulitis of large intestine with perforation and abscess without bleeding: Secondary | ICD-10-CM | POA: Diagnosis not present

## 2019-11-17 DIAGNOSIS — I10 Essential (primary) hypertension: Secondary | ICD-10-CM | POA: Diagnosis not present

## 2019-11-17 MED ORDER — TRAMADOL HCL 50 MG PO TABS: 50.0000 mg | ORAL_TABLET | Freq: Four times a day (QID) | ORAL | 0 refills | Status: DC | PRN

## 2019-11-17 MED ORDER — POLYETHYLENE GLYCOL 3350 17 G PO PACK: 17.0000 g | PACK | Freq: Every day | ORAL | 0 refills | Status: DC | PRN

## 2019-11-17 MED ORDER — ONDANSETRON HCL 4 MG PO TABS: 4.0000 mg | ORAL_TABLET | Freq: Four times a day (QID) | ORAL | 0 refills | Status: DC | PRN

## 2019-11-17 MED ORDER — POTASSIUM CHLORIDE CRYS ER 20 MEQ PO TBCR
40.0000 meq | EXTENDED_RELEASE_TABLET | Freq: Once | ORAL | Status: AC
  Administered 2019-11-17: 40 meq via ORAL
  Filled 2019-11-17: qty 2

## 2019-11-17 MED ORDER — SODIUM CHLORIDE 0.9% FLUSH: 5.0000 mL | Freq: Every day | INTRAVENOUS | 0 refills | Status: DC

## 2019-11-17 MED ORDER — TRAMADOL HCL 50 MG PO TABS: 50.0000 mg | ORAL_TABLET | Freq: Four times a day (QID) | ORAL | 0 refills | Status: AC | PRN

## 2019-11-17 NOTE — Discharge Summary (Signed)
Physician Discharge Summary  [name] IRW:[id number] DOB: 1965-11-18 DOA: 11/14/2019  PCP: Kristen Loader, FNP  Admit date: 11/14/2019 Discharge date: 11/17/2019  Admitted From: Home Disposition: Home  Recommendations for Outpatient Follow-up:  1. Follow up with PCP in 1-2 weeks 2. Follow up with General Surgery within 3 weeks 3. Follow up with IR within 1-2 weeks 4. Please obtain CMP/CBC, Mag, Phos in one week 5. Please follow up on the following pending results:  Home Health: No Equipment/Devices: None  Discharge Condition: Stable CODE STATUS: FULL CODE  Diet recommendation: Soft Diet   Brief/Interim Summary: HPI per Dr. Inda Merlin on 11/14/19 54 year old female with past medical history of hypertension, diverticulosis and frequent bouts of diverticulitis over at least the past year who presents to [facility] emergency department due to complaints of persisting lower abdominal pain after recent diagnosis of complicated diverticulitis.  Of note, patient was recently hospitalized at [facility] from 7/6-1/9 for complicated diverticulitis with severe sepsis after failing outpatient antibiotic therapy. Diverticulitis was complicated by multiple abdominal abscesses that ended up growing out E. coli, Pseudomonas and Streptococcus. Blood cultures remain negative throughout the hospitalization. Patient received intravenous Zosyn throughout the hospitalization. Interventional radiology and general surgery were consulted and the decision was made to place drains in to the abscesses in the patient's abdomen. General surgery recommended going home on oral antibiotic therapy on a full liquid diet with outpatient follow-up. Patient was eventually discharged home on levofloxacin and Flagyl on 8/9.  Unfortunately, patient reports a delay in starting her oral antibiotic therapy due to her pharmacy not having IV antibiotics in stock. Patient did not start antibiotic  therapy until 8/11. Over the span of time, patient states that the drainage coming from both sites turned from a creamcolor to brown and became more and more foul-smelling. Additionally, patient began to experience drainage around the drain insertion site as well. Patient continued to experience lower abdominal pain that she describes as mild to moderate intensity, sharp in quality, worse with movement and nonradiating.  Pain continue to persist for several days. Patient denies any associated fevers, weakness, changes in appetite or diarrhea.  Patient eventually presented to [facility] emergency department for evaluation. Upon evaluation in the emergency department patient was found to have a worsening leukocytosis and other SIRS criteria including tachycardia.CT imaging of the abdomen was performed revealing redemonstration of multiple large abscesses with drainage catheters seemingly in place with additional small amounts of ascites noted anteriorly in the pelvis concerning for additional new abscesses. One of the abscesses appeared to be developing fistulous connections to the inflamed sigmoid colon. Case was discussed with Dr.Tsueiwith general surgery who recommended admission by the hospitalist group with IR consultation for possible replacement of the drains which he feels are likely clogged. Patient was initiated on intravenous Zosyn. The hospitalist group was then called to assess the patient for admission to the hospital.  **Interim History General surgery and interventional radiology evaluated.  Drains were flushed and general surgery recommends continue clear liquid diet again today.  Interventional radiology does not recommend replacing the drains at this time but did change them to gravity bags. Patient's WBC is trending down and General Surgery and IR both signed off. She will be resumed on her Home Abx and follow up with General Surgery and IR. She tolerated her diet  without issue. She was stable to D/C home and will follow up with PCP within 1-2 weeks, IR within 1-2 weeks and General Surgery  within 3 weeks.   Discharge Diagnoses:  Principal Problem:   Diverticulitis of large intestine with abscess Active Problems:   Essential hypertension   Anemia, chronic disease  Diverticulitis of large intestine with Diverticular Abscess s/p 2 Percutaneous Drains Sepsis, poA 2/2 to Above, improved -Patient presenting with extremely complicated diverticulitis of the sigmoid colon with development of multiple large abscesses with concern for clinical worsening since her recent discharge.  -Additionally, there is concern for developing fistulous to the sigmoid colon which would likely explain the feculent appearing material and foul smell on examination. -This interval worsening is in part likely secondary to the significant delay in initiating oral antibiotic therapy in the outpatient setting. -While both catheters seem to be inserted in each of the large abscess sites, general surgery(Dr. Tsuei)feels that they are likely clogged and is requesting that IR be reconsulted for replacement of these catheters/drains. -She was placed on intravenous Zosyn and will continue for now and change back to Levofloxacin and Metronidazole po at D/C  -Hydrating patient with intravenous isotonic fluids; she is given a bolus of lactated Ringer's 1 L and then started on lactated Ringer infusion of 125 mL's per hour for 16 hours and now stopped  -Patient exhibited multiple SIRS criteria and met sepsis Criteriabut does not reveal any evidence of organ dysfunction to suggest underlying severe sepsis at this time; her heart rate was elevated to 140 and her respiratory rate was elevated (>20) on admission along with elevated WBC on admission sepsis patient had a heart rate of 141 respiratory rate of 24 WBC is 17.5 and she came in with diverticular abscess blocked drain -WBC worsened and went from  10.5 on the day of discharge last week on 11/10/2019 and worsened to 17.5 on admission to 20.2; Now WBC is 9.4 -Blood cultures and drainage cultures have been ordered. -Of note, previous drainage culture on 8/3 grew E. coli, PseudomonasAeruginosaand Streptococcus Constellatus; Resume Home Abx as above  -General surgery stated that they will continue to follow, the recommendations are appreciated.  -General surgery recommending the patient be on a clear liquid diet for now but would not advance this beyond until her WBC starts to decrease; 11/16/19 they recommend continuing  -Interventional radiology was consulted and they gave the patient and nursing instructions for drain management and they do not recommend drain exchange at this time and recommend continue current drain management with every shift flushes and monitoring output.  They plan for repeat CT/possible drain injection 1 output less than 10 cc/day -General Surgery had no further Recc's and advanced Diet. IR evaluated and changed JP drains to Gravity Bags. They will follow up with the patient on 11/25/19 and recommended Flushing with 5 mL Daily  -As needed analgesics with tramadol 50 mg p.o. every 6 as needed for moderate pain and 100 mg p.o. every 6 hours as needed for severe pain for associated abdominal pain; also continue with hyoscyamine 0.125 mg p.o. every 6 hours bladder spasms and abdominal cramping; Will continue Tramadol at D/C and given patient a limited number given that she feels it works better for her, -Continue supportive care and continue with antiemetics with p.o./IV Zofran -Continue with bowel regimen with MiraLAX  Essential Hypertension -Continue home regimen of antihypertensive therapy with losartan 50 mg p.o. daily -BP at goal and is 108/74  Anemia of chronic disease/Microcytic Anemia -Recent iron panel suggestive of anemia of chronic disease, discharging provider during last hospitalization feels there is likely some  superimposed iron deficiency as well.  -  Patient's hemoglobin/hematocrit went from 10.0/30.4 -> 9.2/27.6 -> 8.2/24.1 -> 8.3/24.7with an MCV of 70.6 today -Likely dilutional Drop as she was getting IVF; (Now stopped) -We will continue previously prescribed regimen of oral iron supplementation with ferrous sulfate 325 mg p.o. twice daily. -Monitoring hemoglobin and hematocrit with serial CBCs -Continue to monitor for signs and symptoms of bleeding; currently no overt bleeding noted  -Follow up within 1-2 weeks   Anxiety -Continue with Alprazolam 0.5 mg p.o. 3 times daily as needed for anxiety  Hyperbilirubinemia -Patient's T bili was slightly elevated and went to 1.3; Now it is 0.8 -continue to monitor and trend and repeat CMP in the a.m.  Hyperglycemia -Patient's blood sugar was elevated on admission at 119 and repeat this morning was 132 -Check hemoglobin A1c and HbA1c was 5.5 -Continue monitor and trend blood sugars carefully and if necessary will place on a sensitive NovoLog sign scale insulin AC -Follow as an outpatient   Obesity -Estimated body mass index is 31.62 kg/m as calculated from the following:   Height as of this encounter: 5' 6.5" (1.689 m).   Weight as of this encounter: 90.2 kg. -Weight Loss and Dietary Counseling given  Discharge Instructions  Discharge Instructions    Call MD for:  difficulty breathing, headache or visual disturbances   Complete by: As directed    Call MD for:  extreme fatigue   Complete by: As directed    Call MD for:  hives   Complete by: As directed    Call MD for:  persistant dizziness or light-headedness   Complete by: As directed    Call MD for:  persistant nausea and vomiting   Complete by: As directed    Call MD for:  redness, tenderness, or signs of infection (pain, swelling, redness, odor or green/yellow discharge around incision site)   Complete by: As directed    Call MD for:  severe uncontrolled pain   Complete by: As  directed    Call MD for:  temperature >100.4   Complete by: As directed    Diet - low sodium heart healthy   Complete by: As directed    SOFT   Discharge instructions   Complete by: As directed    You were cared for by a hospitalist during your hospital stay. If you have any questions about your discharge medications or the care you received while you were in the hospital after you are discharged, you can call the unit and ask to speak with the hospitalist on call if the hospitalist that took care of you is not available. Once you are discharged, your primary care physician will handle any further medical issues. Please note that NO REFILLS for any discharge medications will be authorized once you are discharged, as it is imperative that you return to your primary care physician (or establish a relationship with a primary care physician if you do not have one) for your aftercare needs so that they can reassess your need for medications and monitor your lab values.  Follow up with PCP, General Surgery, and Interventional Radiology. Take all medications as prescribed. If symptoms change or worsen please return to the ED for evaluation   Increase activity slowly   Complete by: As directed      Allergies as of 11/17/2019      Reactions   Aspirin    diarheea , stomach ache.       Medication List    STOP taking these medications   oxyCODONE  5 MG immediate release tablet Commonly known as: Oxy IR/ROXICODONE     TAKE these medications   ALPRAZolam 0.5 MG tablet Commonly known as: XANAX Take 0.5 mg by mouth 3 (three) times daily as needed for anxiety.   ferrous sulfate 325 (65 FE) MG tablet Take 1 tablet (325 mg total) by mouth 2 (two) times daily with a meal.   hyoscyamine 0.125 MG tablet Commonly known as: LEVSIN Take 0.125 mg by mouth every 6 (six) hours as needed for bladder spasms or cramping.   levofloxacin 500 MG tablet Commonly known as: Levaquin Take 1 tablet (500 mg total)  by mouth daily for 25 days.   losartan-hydrochlorothiazide 50-12.5 MG tablet Commonly known as: HYZAAR Take 1 tablet by mouth daily.   metroNIDAZOLE 500 MG tablet Commonly known as: Flagyl Take 1 tablet (500 mg total) by mouth 3 (three) times daily for 25 days.   ondansetron 4 MG tablet Commonly known as: ZOFRAN Take 1 tablet (4 mg total) by mouth every 6 (six) hours as needed for nausea.   polyethylene glycol 17 g packet Commonly known as: MIRALAX / GLYCOLAX Take 17 g by mouth daily as needed for mild constipation.   promethazine 25 MG tablet Commonly known as: PHENERGAN Take 25 mg by mouth 2 (two) times daily as needed for nausea or vomiting.   saccharomyces boulardii 250 MG capsule Commonly known as: FLORASTOR Take 1 capsule (250 mg total) by mouth 2 (two) times daily.   sodium chloride flush 0.9 % Soln Commonly known as: NS Inject 5 mLs into the vein daily.   traMADol 50 MG tablet Commonly known as: ULTRAM Take 1 tablet (50 mg total) by mouth every 6 (six) hours as needed for up to 3 days for moderate pain.       Follow-up Information    Ileana Roup, MD Follow up in 3 week(s).   Specialty: General Surgery Why: please go to your previously scheduled appointment Contact information: [place] [address] [place], [place], FNP. Call.   Specialty: Family Medicine Why: Follow up within 1-2 weeks Contact information: [place] [address] [place]. Call.   Specialty: Radiology Why: Follow up with [facility] 11/25/19 Contact information: [address] [id number] [place] [address] [phone]             Allergies  Allergen Reactions  . Aspirin     diarheea , stomach ache.    Consultations:  General Surgery  Interventional Radiology  Procedures/Studies: CT ABDOMEN PELVIS W  CONTRAST  Result Date: 11/14/2019 CLINICAL DATA:  Intra-abdominal abscess. JP drain not draining well, accumulation of drainage on the T-drain sponge. EXAM: CT ABDOMEN AND PELVIS WITH CONTRAST TECHNIQUE: Multidetector CT imaging of the abdomen and pelvis was performed using the standard protocol following bolus administration of intravenous contrast. CONTRAST:  114m OMNIPAQUE IOHEXOL 300 MG/ML  SOLN COMPARISON:  Multiple exams, including 11/02/2019 FINDINGS: Lower chest: Unremarkable Hepatobiliary: Heterogeneity in the gallbladder could reflect gallstones or sludge. 0.7 by 0.4 cm hypodense lesion in the lateral segment left hepatic lobe on image 12/2 is technically too small to characterize although statistically likely to be a benign cyst or similar benign lesion. No significant biliary dilatation. Pancreas: Unremarkable Spleen: Unremarkable Adrenals/Urinary Tract: The kidneys and ureters appear unremarkable. There is an abscess along the upper margin of the urinary  bladder as noted below. Stomach/Bowel: There is considerable descending and sigmoid colon diverticulosis. There is an apparent fistulous connection between the sigmoid colon and the abscess collection above the urinary bladder shown on image 95/5. There is also local inflammation in this vicinity such that active diverticulitis may be persisting. Likewise, cannot exclude a second fistulous connection between the proximal sigmoid colon and the abscess collection above the urinary bladder eccentric to the left on image 106/5. There are 2 dominant abscesses in this vicinity. The lesion just above the urinary bladder measures proximally 7.6 by 3.4 by 3.7 cm (volume = 50 cm^3), within this abscess there is coiled a pigtail drain is coiled. A second lesion is present in the subcutaneous tissues just anterior to the linea alba, and this second abscess also contains a pigtail catheter and is traversed by the first pigtail catheter is well. This second  abscess measures 7.6 by 4.2 by 5.8 cm (volume = 97 cm^3) and contains fluid and gas. There is surrounding inflammatory phlegmon in the subcutaneous adipose tissues. Small amount of anterior pelvic ascites noted bilaterally, but without obvious enhancing margins to suggest that these represent additional abscesses. No discontinuity or obvious kinking of the intra-portions the drainage catheters identified. Vascular/Lymphatic: Unremarkable Reproductive: Uterus absent. Other: No supplemental non-categorized findings. Musculoskeletal: Multilevel facet arthropathy in the lumbar spine, with suspected associated right foraminal impingement at L1-2 and L4-5, and left foraminal impingement at L1-2, L3-4, likely L4-5. IMPRESSION: 1. A 50 cubic cm abscess just above the urinary bladder has at least 1 and possibly 2 fistulous connections to the inflamed sigmoid colon. Descending and sigmoid colon diverticulosis. A pigtail catheter is present with loop formed within this abscess. 2. A 97 cubic cm abscess is present in the midline subcutaneous tissues of the anterior pelvis, with surrounding inflammatory stranding in the subcutaneous tissues indicating phlegmon. A pigtail catheter is present with loop formed within this abscess. The other pigtail catheter traverses this abscess to reach the abscess above the urinary bladder. 3. Small amounts of ascites anteriorly in the pelvis, without marginal enhancement to suggest that these collections represent new abscesses. 4. Heterogeneity in the gallbladder could reflect gallstones or sludge. 5. Multilevel facet arthropathy in the lumbar spine with associated foraminal impingement. Electronically Signed   By: Van Clines M.D.   On: 11/14/2019 20:56   CT ABDOMEN PELVIS W CONTRAST  Result Date: 11/02/2019 CLINICAL DATA:  Abdominal pain. EXAM: CT ABDOMEN AND PELVIS WITH CONTRAST TECHNIQUE: Multidetector CT imaging of the abdomen and pelvis was performed using the standard protocol  following bolus administration of intravenous contrast. CONTRAST:  134m OMNIPAQUE IOHEXOL 300 MG/ML  SOLN COMPARISON:  CT abdomen dated 10/05/2012 FINDINGS: Lower chest: No acute abnormality. Hepatobiliary: No focal liver abnormality is seen. No gallstones, gallbladder wall thickening, or biliary dilatation. Pancreas: Unremarkable. No pancreatic ductal dilatation or surrounding inflammatory changes. Spleen: Normal in size without focal abnormality. Adrenals/Urinary Tract: Adrenal glands appear normal. Kidneys appear normal without mass, stone or hydronephrosis. No obstructing ureteral or bladder calculi are identified. Stomach/Bowel: Marked thickening of the walls of the sigmoid colon indicating acute diverticulitis. Multiple abscess collections extending inferior and anterior to the affected segment of the sigmoid colon. These abscess collections are potentially multiloculated but the major components appear to be contiguous from just above the level of the bladder to the anterior abdominal wall, overall measuring approximately 12 cm AP extent (series 3, image 70). The component of the abscess collection within the anterior abdominal wall measures 7.2 x 3.2  x 5.8 cm (craniocaudal by AP by transverse dimensions) (series 3, image 71; coronal series 6, image 18). The most inferior component of the abscess collection abuts the dome of the bladder, and there is thickening of the underlying bladder wall, suggesting a developing colovesical fistula (axial series 3, images 70 through 75). Extensive diverticulosis throughout the colon, but no other site of acute/subacute diverticulitis. No bowel obstruction. Vascular/Lymphatic: No significant vascular findings are present. No enlarged abdominal or pelvic lymph nodes. Reproductive: Presumed hysterectomy.  No adnexal mass or free fluid. Other: No free intraperitoneal air identified. Musculoskeletal: No acute or suspicious osseous finding. IMPRESSION: 1. Acute or subacute  diverticulitis of the sigmoid colon. 2. Multiple associated abscess collections extending inferior and anterior to the affected segment of the sigmoid colon. These abscess collections are potentially multiloculated but the major components appear to be contiguous from just above the level of the bladder to the anterior abdominal wall, overall measuring approximately 12 cm AP extent. The component of the abscess collection within the anterior abdominal wall measures 7.2 x 3.2 x 5.8 cm. The most inferior component of the abscess collection abuts the dome of the bladder, and there is thickening of the underlying bladder wall, suggesting a developing colovesical fistula. Given the distortion of the sigmoid colon loops/walls, I suspect colocolic fistulas as well. 3. Extensive diverticulosis. 4. No free intraperitoneal air identified. Electronically Signed   By: Franki Cabot M.D.   On: 11/02/2019 14:21   CT IMAGE GUIDED DRAINAGE BY PERCUTANEOUS CATHETER  Result Date: 11/04/2019 INDICATION: 54 year old female with a history of pelvic abscess, trans spatial EXAM: CT GUIDED DRAINAGE OF  ABSCESS MEDICATIONS: The patient is currently admitted to the hospital and receiving intravenous antibiotics. The antibiotics were administered within an appropriate time frame prior to the initiation of the procedure. ANESTHESIA/SEDATION: 1.0 mg IV Versed 50 mcg IV Fentanyl Moderate Sedation Time:  21 minutes The patient was continuously monitored during the procedure by the interventional radiology nurse under my direct supervision. COMPLICATIONS: None TECHNIQUE: Informed written consent was obtained from the patient after a thorough discussion of the procedural risks, benefits and alternatives. All questions were addressed. Maximal Sterile Barrier Technique was utilized including caps, mask, sterile gowns, sterile gloves, sterile drape, hand hygiene and skin antiseptic. A timeout was performed prior to the initiation of the procedure.  PROCEDURE: The operative field was prepped with Chlorhexidine in a sterile fashion, and a sterile drape was applied covering the operative field. A sterile gown and sterile gloves were used for the procedure. Local anesthesia was provided with 1% Lidocaine. Once the patient is prepped and draped in the usual sterile fashion, 1% lidocaine was used for local anesthesia. Two parallel 18 gauge trocar needles were advanced with CT guidance into the deep abscess and the superficial component of the abscess. Using modified Seldinger technique, a 12 French drain was placed into the deep abscess, and separately into the superficial abscess. Approximately 60 cc of purulent material aspirated from the deep abscess with a culture sent. Both catheters were sutured in position and attached to bulb suction drainage. Final CT was acquired. Patient tolerated the procedure well and remained hemodynamically stable throughout. No complications were encountered and no significant blood loss. FINDINGS: CT demonstrates a multi spatial abscess within the pelvis, similar to the comparison CT. After drainage placement, there is a 17 [place] drain from anterior midline approach to the deep component, and a tangential right of midline 12 French drain into the superficial component. Both are attached to  bulb drainage. IMPRESSION: Status post CT-guided drainage with placement of 2 drainage catheters into a multi spatial abscess of the pelvis. Signed, Dulcy Fanny. Dellia Nims, RPVI [facility] Electronically Signed   By: Corrie Mckusick D.O.   On: 11/04/2019 10:03   CT IMAGE GUIDED DRAINAGE BY PERCUTANEOUS CATHETER  Result Date: 11/04/2019 INDICATION: 54 year old female with a history of pelvic abscess, trans spatial EXAM: CT GUIDED DRAINAGE OF  ABSCESS MEDICATIONS: The patient is currently admitted to the hospital and receiving intravenous antibiotics. The antibiotics were administered within  an appropriate time frame prior to the initiation of the procedure. ANESTHESIA/SEDATION: 1.0 mg IV Versed 50 mcg IV Fentanyl Moderate Sedation Time:  21 minutes The patient was continuously monitored during the procedure by the interventional radiology nurse under my direct supervision. COMPLICATIONS: None TECHNIQUE: Informed written consent was obtained from the patient after a thorough discussion of the procedural risks, benefits and alternatives. All questions were addressed. Maximal Sterile Barrier Technique was utilized including caps, mask, sterile gowns, sterile gloves, sterile drape, hand hygiene and skin antiseptic. A timeout was performed prior to the initiation of the procedure. PROCEDURE: The operative field was prepped with Chlorhexidine in a sterile fashion, and a sterile drape was applied covering the operative field. A sterile gown and sterile gloves were used for the procedure. Local anesthesia was provided with 1% Lidocaine. Once the patient is prepped and draped in the usual sterile fashion, 1% lidocaine was used for local anesthesia. Two parallel 18 gauge trocar needles were advanced with CT guidance into the deep abscess and the superficial component of the abscess. Using modified Seldinger technique, a 12 French drain was placed into the deep abscess, and separately into the superficial abscess. Approximately 60 cc of purulent material aspirated from the deep abscess with a culture sent. Both catheters were sutured in position and attached to bulb suction drainage. Final CT was acquired. Patient tolerated the procedure well and remained hemodynamically stable throughout. No complications were encountered and no significant blood loss. FINDINGS: CT demonstrates a multi spatial abscess within the pelvis, similar to the comparison CT. After drainage placement, there is a 68 [place] drain from anterior midline approach to the deep component, and a tangential right of midline 12 French drain into the  superficial component. Both are attached to bulb drainage. IMPRESSION: Status post CT-guided drainage with placement of 2 drainage catheters into a multi spatial abscess of the pelvis. Signed, Dulcy Fanny. Dellia Nims, RPVI [facility] Electronically Signed   By: Corrie Mckusick D.O.   On: 11/04/2019 10:03      Subjective: Seen and examined at bedside and she is doing well.  Denies chest pain, lightheadedness or dizziness.  Drains have changed in fluid and consistency so IR was reconsulted and they changed her to a gravity bag.  General surgery had no further recommendations recommend to continue antibiotics.  She is deemed stable to be discharged home at this time as her numbers have improved and her white blood cell count is resolved.  She tolerated her diet without issues and she is to follow-up with her specialists in the coming weeks.  Discharge Exam: Vitals:   11/17/19 0436 11/17/19 1231  BP: 108/73 107/67  Pulse: 99 (!) 101  Resp: 17 18  Temp: 98.2 F (36.8 C) 98.3 F (36.8 C)  SpO2: 100% 99%   Vitals:   11/16/19 1331 11/16/19 2015 11/17/19 0436 11/17/19 1231  BP: 108/73 96/62 108/73  107/67  Pulse: (!) 105 78 99 (!) 101  Resp: _0 Temp: 98.1 F (36.7 C) 98.1 F (36.7 C) 98.2 F (36.8 C) 98.3 F (36.8 C)  TempSrc: Oral Oral Oral Oral  SpO2: 95% 100% 100% 99%  Weight:      Height:       General: Pt is alert, awake, not in acute distress Cardiovascular: RRR, S1/S2 +, no rubs, no gallops Respiratory: Diminished bilaterally, no wheezing, no rhonchi Abdominal: Soft, NT, ND, Has 2 drains in place. bowel sounds + Extremities: no edema, no cyanosis  The results of significant diagnostics from this hospitalization (including imaging, microbiology, ancillary and laboratory) are listed below for reference.    Microbiology: Recent Results (from the past 240 hour(s))  SARS Coronavirus 2 by RT PCR (hospital order,  performed in [facility] hospital lab) Nasopharyngeal Nasopharyngeal Swab     Status: None   Collection Time: 11/14/19  9:47 PM   Specimen: Nasopharyngeal Swab  Result Value Ref Range Status   SARS Coronavirus 2 NEGATIVE NEGATIVE Final    Comment: (NOTE) SARS-CoV-2 target nucleic acids are NOT DETECTED.  The SARS-CoV-2 RNA is generally detectable in upper and lower respiratory specimens during the acute phase of infection. The lowest concentration of SARS-CoV-2 viral copies this assay can detect is 250 copies / mL. A negative result does not preclude SARS-CoV-2 infection and should not be used as the sole basis for treatment or other patient management decisions.  A negative result may occur with improper specimen collection / handling, submission of specimen other than nasopharyngeal swab, presence of viral mutation(s) within the areas targeted by this assay, and inadequate number of viral copies (<250 copies / mL). A negative result must be combined with clinical observations, patient history, and epidemiological information.  Fact Sheet for Patients:   StrictlyIdeas.no  Fact Sheet for Healthcare Providers: BankingDealers.co.za  This test is not yet approved or  cleared by the [place] FDA and has been authorized for detection and/or diagnosis of SARS-CoV-2 by FDA under an Emergency Use Authorization (EUA).  This EUA will remain in effect (meaning this test can be used) for the duration of the COVID-19 declaration under Section 564(b)(1) of the Act, 21 U.S.C. section 360bbb-3(b)(1), unless the authorization is terminated or revoked sooner.  Performed at [facility], [place] [address], [place], [place] [address]   Culture, blood (routine x 2)     Status: None (Preliminary result)   Collection Time: 11/15/19  2:10 AM   Specimen: BLOOD  Result Value Ref Range Status   Specimen Description BLOOD LEFT ANTECUBITAL  Final    Special Requests   Final    BOTTLES DRAWN AEROBIC AND ANAEROBIC Blood Culture adequate volume   Culture   Final    NO GROWTH 2 DAYS Performed at [facility], [place] [address] [place], [place] [address]    Report Status PENDING  Incomplete  Culture, blood (routine x 2)     Status: None (Preliminary result)   Collection Time: 11/15/19  2:17 AM   Specimen: BLOOD LEFT HAND  Result Value Ref Range Status   Specimen Description BLOOD LEFT HAND  Final   Special Requests AEROBIC BOTTLE ONLY Blood Culture adequate volume  Final   Culture   Final    NO GROWTH 2 DAYS Performed at [facility], [place] [address] [place], [place] [address]    Report Status PENDING  Incomplete    Labs: BNP (last  3 results) No results for input(s): BNP in the last 8760 hours. Basic Metabolic Panel: Recent Labs  Lab 11/14/19 1116 11/15/19 0216 11/16/19 0225 11/16/19 2127  NA 140 137 139 137  K 3.7 3.6 4.0 3.5  CL 104 102 104 102  CO2 _0 GLUCOSE 119* 132* 83 93  BUN <5* 5* 6 <5*  CREATININE 0.89 0.83 0.70 0.62  CALCIUM 8.6* 8.3* 8.2* 8.1*  MG  --  1.8 2.0 2.0  PHOS  --   --  3.5 3.3   Liver Function Tests: Recent Labs  Lab 11/14/19 1116 11/15/19 0216 11/16/19 0225 11/16/19 2127  AST 15 12* 12* 10*  ALT _1 ALKPHOS 61 55 51 50  BILITOT 1.0 1.3* 0.7 0.8  PROT 6.9 6.3* 5.6* 5.5*  ALBUMIN 2.5* 2.2* 2.0* 2.0*   No results for input(s): LIPASE, AMYLASE in the last 168 hours. No results for input(s): AMMONIA in the last 168 hours. CBC: Recent Labs  Lab 11/14/19 1116 11/15/19 0216 11/16/19 0225 11/16/19 2127  WBC 17.5* 20.2* 10.6* 9.4  NEUTROABS 14.6* 17.5* 7.4 6.2  HGB 10.0* 9.2* 8.2* 8.3*  HCT 30.4* 27.6* 24.1* 24.7*  MCV 72.0* 70.2* 70.9* 70.6*  PLT 438* 356 342 345   Cardiac Enzymes: No results for input(s): CKTOTAL, CKMB, CKMBINDEX, TROPONINI in the last 168 hours. BNP: Invalid input(s): POCBNP CBG: No results for input(s): GLUCAP in the last 168  hours. D-Dimer No results for input(s): DDIMER in the last 72 hours. Hgb A1c Recent Labs    11/16/19 0225  HGBA1C 5.5   Lipid Profile No results for input(s): CHOL, HDL, LDLCALC, TRIG, CHOLHDL, LDLDIRECT in the last 72 hours. Thyroid function studies No results for input(s): TSH, T4TOTAL, T3FREE, THYROIDAB in the last 72 hours.  Invalid input(s): FREET3 Anemia work up No results for input(s): VITAMINB12, FOLATE, FERRITIN, TIBC, IRON, RETICCTPCT in the last 72 hours. Urinalysis    Component Value Date/Time   COLORURINE AMBER (A) 11/02/2019 0630   APPEARANCEUR HAZY (A) 11/02/2019 0630   LABSPEC 1.021 11/02/2019 0630   PHURINE 5.0 11/02/2019 0630   GLUCOSEU NEGATIVE 11/02/2019 0630   HGBUR SMALL (A) 11/02/2019 0630   BILIRUBINUR NEGATIVE 11/02/2019 0630   KETONESUR 80 (A) 11/02/2019 0630   PROTEINUR 100 (A) 11/02/2019 0630   UROBILINOGEN 0.2 10/05/2012 1010   NITRITE NEGATIVE 11/02/2019 0630   LEUKOCYTESUR NEGATIVE 11/02/2019 0630   Sepsis Labs Invalid input(s): PROCALCITONIN,  WBC,  LACTICIDVEN Microbiology Recent Results (from the past 240 hour(s))  SARS Coronavirus 2 by RT PCR (hospital order, performed in [facility] hospital lab) Nasopharyngeal Nasopharyngeal Swab     Status: None   Collection Time: 11/14/19  9:47 PM   Specimen: Nasopharyngeal Swab  Result Value Ref Range Status   SARS Coronavirus 2 NEGATIVE NEGATIVE Final    Comment: (NOTE) SARS-CoV-2 target nucleic acids are NOT DETECTED.  The SARS-CoV-2 RNA is generally detectable in upper and lower respiratory specimens during the acute phase of infection. The lowest concentration of SARS-CoV-2 viral copies this assay can detect is 250 copies / mL. A negative result does not preclude SARS-CoV-2 infection and should not be used as the sole basis for treatment or other patient management decisions.  A negative result may occur with improper specimen collection / handling, submission of specimen other than  nasopharyngeal swab, presence of viral mutation(s) within the areas targeted by this assay, and inadequate number of viral copies (<250 copies / mL). A negative result  must be combined with clinical observations, patient history, and epidemiological information.  Fact Sheet for Patients:   StrictlyIdeas.no  Fact Sheet for Healthcare Providers: BankingDealers.co.za  This test is not yet approved or  cleared by the [place] FDA and has been authorized for detection and/or diagnosis of SARS-CoV-2 by FDA under an Emergency Use Authorization (EUA).  This EUA will remain in effect (meaning this test can be used) for the duration of the COVID-19 declaration under Section 564(b)(1) of the Act, 21 U.S.C. section 360bbb-3(b)(1), unless the authorization is terminated or revoked sooner.  Performed at [facility], [place] [address], [place], [place] [address]   Culture, blood (routine x 2)     Status: None (Preliminary result)   Collection Time: 11/15/19  2:10 AM   Specimen: BLOOD  Result Value Ref Range Status   Specimen Description BLOOD LEFT ANTECUBITAL  Final   Special Requests   Final    BOTTLES DRAWN AEROBIC AND ANAEROBIC Blood Culture adequate volume   Culture   Final    NO GROWTH 2 DAYS Performed at [facility], [place] [address], [place], [place] [address]    Report Status PENDING  Incomplete  Culture, blood (routine x 2)     Status: None (Preliminary result)   Collection Time: 11/15/19  2:17 AM   Specimen: BLOOD LEFT HAND  Result Value Ref Range Status   Specimen Description BLOOD LEFT HAND  Final   Special Requests AEROBIC BOTTLE ONLY Blood Culture adequate volume  Final   Culture   Final    NO GROWTH 2 DAYS Performed at [facility], [place] [address] [place], [place] [address]    Report Status PENDING  Incomplete   Time coordinating discharge: 35 minutes  SIGNED:  Kerney Elbe, DO [facility] 11/17/2019, 6:46 PM Pager is on [place]  If 7PM-7AM, please contact night-coverage [email or web address]

## 2019-11-17 NOTE — Progress Notes (Signed)
Subjective: Denies pain.  Leakage around LLQ drain starting to slow down some.  Output in particularly the RLQ changed to tan chocolate milk color yesterday.  Tolerating CLD well.  Learning how to manage her drains per RN.  ROS: See above, otherwise other systems negative  Objective: Vital signs in last 24 hours: Temp:  [98.1 F (36.7 C)-98.2 F (36.8 C)] 98.2 F (36.8 C) (08/16 0436) Pulse Rate:  [78-105] 99 (08/16 0436) Resp:  [17-20] 17 (08/16 0436) BP: (96-108)/(62-73) 108/73 (08/16 0436) SpO2:  [95 %-100 %] 100 % (08/16 0436) Last BM Date: 11/14/19  Intake/Output from previous day: 08/15 0701 - 08/16 0700 In: 247.1 [P.O.:120; IV Piggyback:107.1] Out: 125 [Drains:125] Intake/Output this shift: No intake/output data recorded.  PE: Heart: regular Lungs: CTAB Abd: soft, NT, LLQ drain with bloody, slightly brown output, about 5cc currently. 65cc/24hrs. Minimal leakage noted around drain.  Dressings in place.  RLQ drain with tan, chocolate milk type output. 60cc  Lab Results:  Recent Labs    11/16/19 0225 11/16/19 2127  WBC 10.6* 9.4  HGB 8.2* 8.3*  HCT 24.1* 24.7*  PLT 342 345   BMET Recent Labs    11/16/19 0225 11/16/19 2127  NA 139 137  K 4.0 3.5  CL 104 102  CO2 28 27  GLUCOSE 83 93  BUN 6 <5*  CREATININE 0.70 0.62  CALCIUM 8.2* 8.1*   PT/INR No results for input(s): LABPROT, INR in the last 72 hours. CMP     Component Value Date/Time   NA 137 11/16/2019 2127   K 3.5 11/16/2019 2127   CL 102 11/16/2019 2127   CO2 27 11/16/2019 2127   GLUCOSE 93 11/16/2019 2127   BUN <5 (L) 11/16/2019 2127   CREATININE 0.62 11/16/2019 2127   CALCIUM 8.1 (L) 11/16/2019 2127   PROT 5.5 (L) 11/16/2019 2127   ALBUMIN 2.0 (L) 11/16/2019 2127   AST 10 (L) 11/16/2019 2127   ALT 7 11/16/2019 2127   ALKPHOS 50 11/16/2019 2127   BILITOT 0.8 11/16/2019 2127   GFRNONAA >60 11/16/2019 2127   GFRAA >60 11/16/2019 2127   Lipase  No results found for:  LIPASE     Studies/Results: No results found.  Anti-infectives: Anti-infectives (From admission, onward)   Start     Dose/Rate Route Frequency Ordered Stop   11/15/19 0400  piperacillin-tazobactam (ZOSYN) IVPB 3.375 g     Discontinue     3.375 g 12.5 mL/hr over 240 Minutes Intravenous Every 8 hours 11/15/19 0119     11/14/19 2130  piperacillin-tazobactam (ZOSYN) IVPB 3.375 g        3.375 g 100 mL/hr over 30 Minutes Intravenous  Once 11/14/19 2116 11/14/19 2250       Assessment/Plan Diverticulitis with multiple abscess -s/p IR drain x2 -RLQ drain now with tan purulent appearing output.  Concerned for possible fistula to the drain. -IR following.  Will let them determine when they would like to inject these drains -adv to low fiber diet today -no WBC or fever or pain.  Can transition IV abx therapy to oral therapy from our standpoint. -she is surgically stable for DC home once she can manage her drains, obtain her abx as outpatient, and IR can weigh in on the change in her drain output if they would like to do anything else while she is an inpatient.   -has follow up with Dr. Cliffton Asters already scheduled.   FEN - soft diet VTE - lovenox ID -  [email or web address], can transition to augmentin from our standpoint Follow up - Dr. Cliffton Asters   LOS: 2 days    Letha Cape , [facility] 11/17/2019, 9:01 AM Please see Amion for pager number during day hours 7:00am-4:30pm or 7:00am -11:30am on weekends

## 2019-11-17 NOTE — Progress Notes (Signed)
Referring Physician(s): Dr. Elsie Lincoln  Supervising Physician: Malachy Moan  Patient Status:  [facility] - In-pt  Chief Complaint:  Intra abdominal abscess s/p abscess drain placement on   Subjective:  54 y.o. female inpatient. History of HTN and diverticulitis. Patient presented to the ED with pain and swelling to her lower abdomen found to have perforated diverticulitis with development of intra abdominal abscess. IR placed a RLQ and LLQ abscess drain on 8.3.21. Patient was discharged on 8.9.21. Patient was readmitted for drainage around the abscess drains with lower abdomina pain. Patient seen at bedside. Denies any complaints. States that she is now performed the dressing changes and flushing's her self with no issue. RLQ drain has approximately 31ml of tan purulent output. LLQ abscess drain has approximately 10 ml of serosanguinous fluid.  Patient is reporting a marked decrease in drainage around the exit site and improvement of abdominal pain.  CT abd pelvis from 8.13.21 shows a possible fistulous connection with abscess drain in good position. Per note from Jettie Pagan dated 8.16.21 RLQ   Changed from tan chocolate to milk color.  Patient alert and laying in bed, calm and comfortable. Denies any fevers, headache, chest pain, SOB, cough, nausea, vomiting or bleeding. Patient reports an improvement in abdominal pain.   Allergies: Aspirin  Medications: Prior to Admission medications   Medication Sig Start Date End Date Taking? Authorizing Provider  ALPRAZolam Prudy Feeler) 0.5 MG tablet Take 0.5 mg by mouth 3 (three) times daily as needed for anxiety.   Yes [provider]  ferrous sulfate 325 (65 FE) MG tablet Take 1 tablet (325 mg total) by mouth 2 (two) times daily with a meal. 11/10/19 12/10/19 Yes Pahwani, Daleen Bo, MD  hyoscyamine (LEVSIN) 0.125 MG tablet Take 0.125 mg by mouth every 6 (six) hours as needed for bladder spasms or cramping.  10/21/19  Yes [provider]    levofloxacin (LEVAQUIN) 500 MG tablet Take 1 tablet (500 mg total) by mouth daily for 25 days. 11/10/19 12/05/19 Yes Meuth, Lina Sar, PA-C  losartan-hydrochlorothiazide (HYZAAR) 50-12.5 MG per tablet Take 1 tablet by mouth daily.   Yes [provider]  metroNIDAZOLE (FLAGYL) 500 MG tablet Take 1 tablet (500 mg total) by mouth 3 (three) times daily for 25 days. 11/10/19 12/05/19 Yes Meuth, Lina Sar, PA-C  promethazine (PHENERGAN) 25 MG tablet Take 25 mg by mouth 2 (two) times daily as needed for nausea or vomiting.  10/21/19  Yes [provider]  ondansetron (ZOFRAN) 4 MG tablet Take 1 tablet (4 mg total) by mouth every 6 (six) hours as needed for nausea. Patient not taking: Reported on 11/14/2019 11/10/19   Carlena Bjornstad A, PA-C  oxyCODONE (OXY IR/ROXICODONE) 5 MG immediate release tablet Take 1 tablet (5 mg total) by mouth every 6 (six) hours as needed for moderate pain or severe pain. 11/10/19   Meuth, Lina Sar, PA-C  saccharomyces boulardii (FLORASTOR) 250 MG capsule Take 1 capsule (250 mg total) by mouth 2 (two) times daily. 11/10/19   Meuth, Brooke A, PA-C     Vital Signs: BP 108/73 (BP Location: Left Arm)   Pulse 99   Temp 98.2 F (36.8 C) (Oral)   Resp 17   Ht 5' 6.5" (1.689 m)   Wt 198 lb 13.7 oz (90.2 kg)   SpO2 100%   BMI 31.62 kg/m   Physical Exam Vitals and nursing note reviewed.  Constitutional:      Appearance: She is well-developed.  HENT:  Head: Normocephalic and atraumatic.  Eyes:     Conjunctiva/sclera: Conjunctivae normal.  Pulmonary:     Effort: Pulmonary effort is normal.  Abdominal:     Comments: RLQ abscess drain approximately 10 ml of tan feculent op. Drain is placed to JP device.  LLQ abscess drain approximately 10 ml of serosanguinous op. Drain is placed to JP drain.  Musculoskeletal:     Cervical back: Normal range of motion.  Neurological:     Mental Status: She is alert and oriented to person, place, and time.     Imaging: CT ABDOMEN  PELVIS W CONTRAST  Result Date: 11/14/2019 CLINICAL DATA:  Intra-abdominal abscess. JP drain not draining well, accumulation of drainage on the T-drain sponge. EXAM: CT ABDOMEN AND PELVIS WITH CONTRAST TECHNIQUE: Multidetector CT imaging of the abdomen and pelvis was performed using the standard protocol following bolus administration of intravenous contrast. CONTRAST:  OMNIPAQUE IOHEXOL 300 MG/ML  SOLN COMPARISON:  Multiple exams, including 11/02/2019 FINDINGS: Lower chest: Unremarkable Hepatobiliary: Heterogeneity in the gallbladder could reflect gallstones or sludge. 0.7 by 0.4 cm hypodense lesion in the lateral segment left hepatic lobe on image 12/2 is technically too small to characterize although statistically likely to be a benign cyst or similar benign lesion. No significant biliary dilatation. Pancreas: Unremarkable Spleen: Unremarkable Adrenals/Urinary Tract: The kidneys and ureters appear unremarkable. There is an abscess along the upper margin of the urinary bladder as noted below. Stomach/Bowel: There is considerable descending and sigmoid colon diverticulosis. There is an apparent fistulous connection between the sigmoid colon and the abscess collection above the urinary bladder shown on image 95/5. There is also local inflammation in this vicinity such that active diverticulitis may be persisting. Likewise, cannot exclude a second fistulous connection between the proximal sigmoid colon and the abscess collection above the urinary bladder eccentric to the left on image 106/5. There are 2 dominant abscesses in this vicinity. The lesion just above the urinary bladder measures proximally 7.6 by 3.4 by 3.7 cm (volume = 50 cm^3), within this abscess there is coiled a pigtail drain is coiled. A second lesion is present in the subcutaneous tissues just anterior to the linea alba, and this second abscess also contains a pigtail catheter and is traversed by the first pigtail catheter is well. This  second abscess measures 7.6 by 4.2 by 5.8 cm (volume = 97 cm^3) and contains fluid and gas. There is surrounding inflammatory phlegmon in the subcutaneous adipose tissues. Small amount of anterior pelvic ascites noted bilaterally, but without obvious enhancing margins to suggest that these represent additional abscesses. No discontinuity or obvious kinking of the intra-portions the drainage catheters identified. Vascular/Lymphatic: Unremarkable Reproductive: Uterus absent. Other: No supplemental non-categorized findings. Musculoskeletal: Multilevel facet arthropathy in the lumbar spine, with suspected associated right foraminal impingement at L1-2 and L4-5, and left foraminal impingement at L1-2, L3-4, likely L4-5. IMPRESSION: 1. A 50 cubic cm abscess just above the urinary bladder has at least 1 and possibly 2 fistulous connections to the inflamed sigmoid colon. Descending and sigmoid colon diverticulosis. A pigtail catheter is present with loop formed within this abscess. 2. A 97 cubic cm abscess is present in the midline subcutaneous tissues of the anterior pelvis, with surrounding inflammatory stranding in the subcutaneous tissues indicating phlegmon. A pigtail catheter is present with loop formed within this abscess. The other pigtail catheter traverses this abscess to reach the abscess above the urinary bladder. 3. Small amounts of ascites anteriorly in the pelvis, without marginal enhancement to suggest  that these collections represent new abscesses. 4. Heterogeneity in the gallbladder could reflect gallstones or sludge. 5. Multilevel facet arthropathy in the lumbar spine with associated foraminal impingement. Electronically Signed   By: Gaylyn Rong M.D.   On: 11/14/2019 20:56    Labs:  CBC: Recent Labs    11/14/19 1116 11/15/19 0216 11/16/19 0225 11/16/19 2127  WBC 17.5* 20.2* 10.6* 9.4  HGB 10.0* 9.2* 8.2* 8.3*  HCT 30.4* 27.6* 24.1* 24.7*  PLT 438* 356 342 345    COAGS: Recent  Labs    11/04/19 0428  INR 1.4*    BMP: Recent Labs    11/14/19 1116 11/15/19 0216 11/16/19 0225 11/16/19 2127  NA 140 137 139 137  K 3.7 3.6 4.0 3.5  CL 104 102 104 102  CO2 25 25 28 27   GLUCOSE 119* 132* 83 93  BUN <5* 5* 6 <5*  CALCIUM 8.6* 8.3* 8.2* 8.1*  CREATININE 0.89 0.83 0.70 0.62  GFRNONAA >60 >60 >60 >60  GFRAA >60 >60 >60 >60    LIVER FUNCTION TESTS: Recent Labs    11/14/19 1116 11/15/19 0216 11/16/19 0225 11/16/19 2127  BILITOT 1.0 1.3* 0.7 0.8  AST 15 12* 12* 10*  ALT 10 9 8 7   ALKPHOS 61 55 51 50  PROT 6.9 6.3* 5.6* 5.5*  ALBUMIN 2.5* 2.2* 2.0* 2.0*    Assessment and Plan:  54 y.o, female inpatient. History of HTN and diverticulitis. Patient presented to the ED with pain and swelling to her lower abdomen found to have perforated diverticulitis with development of intra abdominal abscess. IR placed a RLQ and LLQ abscess drain on 8.3.21. Patient was discharged on 8.9.21. Patient was readmitted for drainage around the abscess drains with lower abdomina pain. Patient seen at bedside. Denies any complaints. States that she is now performed the dressing changes and flushing's her self with no issue. RLQ drain has approximately 66ml of tan purulent output. LLQ abscess drain has approximately 10 ml of serosanguinous fluid.  Patient is reporting a marked decrease in drainage around the exit site and improvement of abdominal pain.  CT abd pelvis from 8.13.21 shows a possible fistulous connection with abscess drain in good position. Per note from Jettie Pagan dated 8.16.21 RLQ   Changed from tan chocolate to milk color.  Team is requesting evaluation of drains prior to discharge. Per Epic output is:  RLQ - 65 ml, 40 ml, 10 ml  LLQ - 60 ml, 78 ml, 10 ml  WBC is 9.4, BUN 5, AST 10 All labs and medications are within acceptable parameters. Patient is afebrile.  After discussion with IR Attending Dr. Vella Redhead patient drains will be changed to gravity bags. Patient  will be followed up as OP in the IR drain clinic.   Recommend team continue with flushing daily, output recording q shift and dressing changes as needed. WPatient to be followed up in IR drain clinic as OP.   Continue current treatment plans as per surgery   Electronically Signed: Alene Mires, NP 11/17/2019, 12:29 PM   I spent a total of 15 Minutes at the patient's bedside AND on the patient's hospital floor or unit, greater than 50% of which was counseling/coordinating care for intra abdominal abscess drains

## 2019-11-17 NOTE — Progress Notes (Signed)
Discharge instructions reviewed with pt and instructed on where to pick up prescriptions. Pt verbalized understanding and had no questions.  Pt discharged in stable condition via wheelchair with sister.  Amirr Achord Lindsay   

## 2019-11-20 LAB — CULTURE, BLOOD (ROUTINE X 2)
Culture: NO GROWTH
Culture: NO GROWTH
Special Requests: ADEQUATE
Special Requests: ADEQUATE

## 2019-11-24 ENCOUNTER — Other Ambulatory Visit: Payer: Self-pay

## 2019-11-24 ENCOUNTER — Encounter (HOSPITAL_COMMUNITY): Payer: Self-pay

## 2019-11-24 ENCOUNTER — Emergency Department (HOSPITAL_COMMUNITY): Payer: Commercial Managed Care - PPO

## 2019-11-24 ENCOUNTER — Inpatient Hospital Stay (HOSPITAL_COMMUNITY)
Admission: EM | Admit: 2019-11-24 | Discharge: 2019-12-12 | DRG: 329 | Disposition: A | Discharge status: Home Health | Payer: Commercial Managed Care - PPO | Attending: Physician Assistant | Admitting: Physician Assistant

## 2019-11-24 DIAGNOSIS — K5720 Diverticulitis of large intestine with perforation and abscess without bleeding: Secondary | ICD-10-CM | POA: Diagnosis present

## 2019-11-24 DIAGNOSIS — Z66 Do not resuscitate: Secondary | ICD-10-CM | POA: Diagnosis not present

## 2019-11-24 DIAGNOSIS — Z79899 Other long term (current) drug therapy: Secondary | ICD-10-CM | POA: Diagnosis not present

## 2019-11-24 DIAGNOSIS — K651 Peritoneal abscess: Secondary | ICD-10-CM

## 2019-11-24 DIAGNOSIS — I96 Gangrene, not elsewhere classified: Secondary | ICD-10-CM | POA: Diagnosis present

## 2019-11-24 DIAGNOSIS — Z20822 Contact with and (suspected) exposure to covid-19: Secondary | ICD-10-CM | POA: Diagnosis present

## 2019-11-24 DIAGNOSIS — E43 Unspecified severe protein-calorie malnutrition: Secondary | ICD-10-CM | POA: Diagnosis present

## 2019-11-24 DIAGNOSIS — I10 Essential (primary) hypertension: Secondary | ICD-10-CM | POA: Diagnosis present

## 2019-11-24 DIAGNOSIS — I82432 Acute embolism and thrombosis of left popliteal vein: Secondary | ICD-10-CM | POA: Diagnosis present

## 2019-11-24 DIAGNOSIS — I824Y9 Acute embolism and thrombosis of unspecified deep veins of unspecified proximal lower extremity: Secondary | ICD-10-CM

## 2019-11-24 DIAGNOSIS — Z6831 Body mass index (BMI) 31.0-31.9, adult: Secondary | ICD-10-CM

## 2019-11-24 DIAGNOSIS — K658 Other peritonitis: Secondary | ICD-10-CM | POA: Diagnosis present

## 2019-11-24 DIAGNOSIS — K567 Ileus, unspecified: Secondary | ICD-10-CM | POA: Diagnosis not present

## 2019-11-24 DIAGNOSIS — Z90710 Acquired absence of both cervix and uterus: Secondary | ICD-10-CM

## 2019-11-24 DIAGNOSIS — D62 Acute posthemorrhagic anemia: Secondary | ICD-10-CM | POA: Diagnosis not present

## 2019-11-24 DIAGNOSIS — I82442 Acute embolism and thrombosis of left tibial vein: Secondary | ICD-10-CM | POA: Diagnosis present

## 2019-11-24 DIAGNOSIS — K5792 Diverticulitis of intestine, part unspecified, without perforation or abscess without bleeding: Secondary | ICD-10-CM | POA: Diagnosis present

## 2019-11-24 DIAGNOSIS — I82409 Acute embolism and thrombosis of unspecified deep veins of unspecified lower extremity: Secondary | ICD-10-CM

## 2019-11-24 DIAGNOSIS — R188 Other ascites: Secondary | ICD-10-CM

## 2019-11-24 HISTORY — DX: Other specified health status: Z78.9

## 2019-11-24 LAB — CBC WITH DIFFERENTIAL/PLATELET
Abs Immature Granulocytes: 1.03 10*3/uL — ABNORMAL HIGH (ref 0.00–0.07)
Basophils Absolute: 0 10*3/uL (ref 0.0–0.1)
Basophils Relative: 0 %
Eosinophils Absolute: 0 10*3/uL (ref 0.0–0.5)
Eosinophils Relative: 0 %
HCT: 31.1 % — ABNORMAL LOW (ref 36.0–46.0)
Hemoglobin: 10.5 g/dL — ABNORMAL LOW (ref 12.0–15.0)
Immature Granulocytes: 5 %
Lymphocytes Relative: 9 %
Lymphs Abs: 2 10*3/uL (ref 0.7–4.0)
MCH: 23.6 pg — ABNORMAL LOW (ref 26.0–34.0)
MCHC: 33.8 g/dL (ref 30.0–36.0)
MCV: 70 fL — ABNORMAL LOW (ref 80.0–100.0)
Monocytes Absolute: 2.5 10*3/uL — ABNORMAL HIGH (ref 0.1–1.0)
Monocytes Relative: 11 %
Neutro Abs: 17.3 10*3/uL — ABNORMAL HIGH (ref 1.7–7.7)
Neutrophils Relative %: 75 %
Platelets: 426 10*3/uL — ABNORMAL HIGH (ref 150–400)
RBC: 4.44 MIL/uL (ref 3.87–5.11)
RDW: 18.6 % — ABNORMAL HIGH (ref 11.5–15.5)
WBC Morphology: INCREASED
WBC: 22.9 10*3/uL — ABNORMAL HIGH (ref 4.0–10.5)
nRBC: 0 % (ref 0.0–0.2)

## 2019-11-24 LAB — COMPREHENSIVE METABOLIC PANEL
ALT: 8 U/L (ref 0–44)
AST: 16 U/L (ref 15–41)
Albumin: 2 g/dL — ABNORMAL LOW (ref 3.5–5.0)
Alkaline Phosphatase: 90 U/L (ref 38–126)
Anion gap: 14 (ref 5–15)
BUN: 12 mg/dL (ref 6–20)
CO2: 25 mmol/L (ref 22–32)
Calcium: 8.3 mg/dL — ABNORMAL LOW (ref 8.9–10.3)
Chloride: 96 mmol/L — ABNORMAL LOW (ref 98–111)
Creatinine, Ser: 0.78 mg/dL (ref 0.44–1.00)
GFR calc Af Amer: >60 mL/min (ref >60)
GFR calc non Af Amer: >60 mL/min (ref >60)
Glucose, Bld: 128 mg/dL — ABNORMAL HIGH (ref 70–99)
Potassium: 3.7 mmol/L (ref 3.5–5.1)
Sodium: 135 mmol/L (ref 135–145)
Total Bilirubin: 0.8 mg/dL (ref 0.3–1.2)
Total Protein: 6.8 g/dL (ref 6.5–8.1)

## 2019-11-24 LAB — I-STAT BETA HCG BLOOD, ED (MC, WL, AP ONLY): I-stat hCG, quantitative: <5 m[IU]/mL (ref <5)

## 2019-11-24 LAB — SURGICAL PCR SCREEN
MRSA, PCR: NEGATIVE
Staphylococcus aureus: NEGATIVE

## 2019-11-24 LAB — LACTIC ACID, PLASMA
Lactic Acid, Venous: 2.3 mmol/L (ref 0.5–1.9)
Lactic Acid, Venous: 1.2 mmol/L (ref 0.5–1.9)

## 2019-11-24 LAB — URINALYSIS, ROUTINE W REFLEX MICROSCOPIC
Bacteria, UA: NONE SEEN
Bilirubin Urine: NEGATIVE
Glucose, UA: NEGATIVE mg/dL
Hgb urine dipstick: NEGATIVE
Ketones, ur: 20 mg/dL — AB
Nitrite: NEGATIVE
Protein, ur: NEGATIVE mg/dL
Specific Gravity, Urine: >1.046 — ABNORMAL HIGH (ref 1.005–1.030)
pH: 5 (ref 5.0–8.0)

## 2019-11-24 LAB — APTT: aPTT: 36 seconds (ref 24–36)

## 2019-11-24 LAB — PROTIME-INR
INR: 1.4 — ABNORMAL HIGH (ref 0.8–1.2)
Prothrombin Time: 16.6 seconds — ABNORMAL HIGH (ref 11.4–15.2)

## 2019-11-24 LAB — SARS CORONAVIRUS 2 BY RT PCR (HOSPITAL ORDER, PERFORMED IN ~~LOC~~ HOSPITAL LAB): SARS Coronavirus 2: NEGATIVE

## 2019-11-24 LAB — PREALBUMIN: Prealbumin: <5 mg/dL — ABNORMAL LOW (ref 18–38)

## 2019-11-24 MED ORDER — ONDANSETRON 4 MG PO TBDP
4.0000 mg | ORAL_TABLET | Freq: Four times a day (QID) | ORAL | Status: DC | PRN
  Administered 2019-11-24: 4 mg via ORAL
  Filled 2019-11-24 (×2): qty 1

## 2019-11-24 MED ORDER — LOSARTAN POTASSIUM 50 MG PO TABS
50.0000 mg | ORAL_TABLET | Freq: Every day | ORAL | Status: DC
  Administered 2019-11-24 – 2019-12-12 (×17): 50 mg via ORAL
  Filled 2019-11-24 (×18): qty 1

## 2019-11-24 MED ORDER — ACETAMINOPHEN 325 MG PO TABS: 650.0000 mg | ORAL_TABLET | Freq: Four times a day (QID) | ORAL | Status: DC | PRN

## 2019-11-24 MED ORDER — VANCOMYCIN HCL IN DEXTROSE 1-5 GM/200ML-% IV SOLN
1000.0000 mg | Freq: Three times a day (TID) | INTRAVENOUS | Status: DC
  Administered 2019-11-24 – 2019-11-25 (×2): 1000 mg via INTRAVENOUS
  Filled 2019-11-24 (×4): qty 200

## 2019-11-24 MED ORDER — HYDROMORPHONE HCL 1 MG/ML IJ SOLN: 0.5000 mg | Freq: Once | INTRAMUSCULAR | Status: DC

## 2019-11-24 MED ORDER — DIPHENHYDRAMINE HCL 25 MG PO CAPS: 25.0000 mg | ORAL_CAPSULE | Freq: Four times a day (QID) | ORAL | Status: DC | PRN

## 2019-11-24 MED ORDER — ENOXAPARIN SODIUM 40 MG/0.4ML ~~LOC~~ SOLN
40.0000 mg | Freq: Once | SUBCUTANEOUS | Status: AC
  Administered 2019-11-24: 40 mg via SUBCUTANEOUS
  Filled 2019-11-24: qty 0.4

## 2019-11-24 MED ORDER — METHOCARBAMOL 1000 MG/10ML IJ SOLN
500.0000 mg | Freq: Three times a day (TID) | INTRAVENOUS | Status: DC | PRN
  Filled 2019-11-24 (×2): qty 5

## 2019-11-24 MED ORDER — VANCOMYCIN HCL IN DEXTROSE 1-5 GM/200ML-% IV SOLN: 1000.0000 mg | Freq: Once | INTRAVENOUS | Status: DC

## 2019-11-24 MED ORDER — METRONIDAZOLE IN NACL 5-0.79 MG/ML-% IV SOLN
500.0000 mg | Freq: Once | INTRAVENOUS | Status: AC
  Administered 2019-11-24: 500 mg via INTRAVENOUS
  Filled 2019-11-24: qty 100

## 2019-11-24 MED ORDER — LACTATED RINGERS IV BOLUS
1000.0000 mL | Freq: Once | INTRAVENOUS | Status: AC
  Administered 2019-11-24: 1000 mL via INTRAVENOUS

## 2019-11-24 MED ORDER — ACETAMINOPHEN 650 MG RE SUPP: 650.0000 mg | Freq: Four times a day (QID) | RECTAL | Status: DC | PRN

## 2019-11-24 MED ORDER — VANCOMYCIN HCL 1500 MG/300ML IV SOLN
1500.0000 mg | Freq: Once | INTRAVENOUS | Status: AC
  Administered 2019-11-24: 1500 mg via INTRAVENOUS
  Filled 2019-11-24: qty 300

## 2019-11-24 MED ORDER — MUPIROCIN 2 % EX OINT
1.0000 "application " | TOPICAL_OINTMENT | Freq: Two times a day (BID) | CUTANEOUS | Status: AC
  Administered 2019-11-25 – 2019-11-29 (×8): 1 via NASAL
  Filled 2019-11-24 (×4): qty 22

## 2019-11-24 MED ORDER — OXYCODONE HCL 5 MG PO TABS: 5.0000 mg | ORAL_TABLET | ORAL | Status: DC | PRN

## 2019-11-24 MED ORDER — METOPROLOL TARTRATE 5 MG/5ML IV SOLN: 5.0000 mg | Freq: Four times a day (QID) | INTRAVENOUS | Status: DC | PRN

## 2019-11-24 MED ORDER — DIPHENHYDRAMINE HCL 50 MG/ML IJ SOLN: 25.0000 mg | Freq: Four times a day (QID) | INTRAMUSCULAR | Status: DC | PRN

## 2019-11-24 MED ORDER — HYDROMORPHONE HCL 1 MG/ML IJ SOLN
1.0000 mg | INTRAMUSCULAR | Status: AC | PRN
  Administered 2019-11-24 – 2019-11-25 (×2): 1 mg via INTRAVENOUS
  Filled 2019-11-24 (×3): qty 1

## 2019-11-24 MED ORDER — ONDANSETRON HCL 4 MG/2ML IJ SOLN
4.0000 mg | Freq: Four times a day (QID) | INTRAMUSCULAR | Status: DC | PRN
  Administered 2019-11-25 – 2019-12-11 (×8): 4 mg via INTRAVENOUS
  Filled 2019-11-24 (×8): qty 2

## 2019-11-24 MED ORDER — SODIUM CHLORIDE 0.9 % IV SOLN: INTRAVENOUS | Status: DC

## 2019-11-24 MED ORDER — MORPHINE SULFATE (PF) 2 MG/ML IV SOLN: 2.0000 mg | INTRAVENOUS | Status: DC | PRN

## 2019-11-24 MED ORDER — IOHEXOL 300 MG/ML  SOLN
100.0000 mL | Freq: Once | INTRAMUSCULAR | Status: AC | PRN
  Administered 2019-11-24: 100 mL via INTRAVENOUS

## 2019-11-24 MED ORDER — HYDROMORPHONE HCL 1 MG/ML IJ SOLN
1.0000 mg | Freq: Once | INTRAMUSCULAR | Status: AC
  Administered 2019-11-24: 1 mg via INTRAVENOUS
  Filled 2019-11-24: qty 1

## 2019-11-24 MED ORDER — PIPERACILLIN-TAZOBACTAM 3.375 G IVPB
3.3750 g | Freq: Three times a day (TID) | INTRAVENOUS | Status: AC
  Administered 2019-11-24 – 2019-11-29 (×17): 3.375 g via INTRAVENOUS
  Filled 2019-11-24 (×13): qty 50

## 2019-11-24 MED ORDER — HYDROCHLOROTHIAZIDE 12.5 MG PO CAPS
12.5000 mg | ORAL_CAPSULE | Freq: Every day | ORAL | Status: DC
  Administered 2019-11-24 – 2019-12-12 (×17): 12.5 mg via ORAL
  Filled 2019-11-24 (×18): qty 1

## 2019-11-24 MED ORDER — PANTOPRAZOLE SODIUM 40 MG IV SOLR
40.0000 mg | Freq: Every day | INTRAVENOUS | Status: DC
  Administered 2019-11-24 – 2019-12-05 (×12): 40 mg via INTRAVENOUS
  Filled 2019-11-24 (×12): qty 40

## 2019-11-24 MED ORDER — FENTANYL CITRATE (PF) 100 MCG/2ML IJ SOLN
100.0000 ug | Freq: Once | INTRAMUSCULAR | Status: AC
  Administered 2019-11-24: 100 ug via INTRAVENOUS
  Filled 2019-11-24: qty 2

## 2019-11-24 MED ORDER — SODIUM CHLORIDE 0.9 % IV SOLN
2.0000 g | Freq: Once | INTRAVENOUS | Status: AC
  Administered 2019-11-24: 2 g via INTRAVENOUS
  Filled 2019-11-24: qty 2

## 2019-11-24 NOTE — ED Triage Notes (Signed)
Pt bib EMS from home with c/o abd pain x 3 weeks, that has been increasing the past few days. 2 abd drains recently placed, pt reports no drainage but green pus present surrounding drains. Hx diverticulitis, sepsis. Currently A&Ox4, tachycardic, rates pain 9/10.  EMS v/s: 130 HR 98% RA 130/60 124 CBG

## 2019-11-24 NOTE — H&P (Signed)
[facility] Admission Note  [name] 1966/02/14  [id number].    Requesting MD:  Chief Complaint/Reason for Consult: Pricilla Stein  HPI:  [name] is a 54yo female PMH HTN and recurrent diverticulitis who returned to the ED today with 2 days of worsening abdominal pain.  Patient was admitted 8/1 through 8/9 with complicated diverticulitis and multiple abscesses. She underwent percutaneous drain x2 placement by IR 11/04/19 - one into the superficial component and one drain intra-pelvic. Cultures grew E coli, strep C, pseudomonas. She was kept on IV zosyn during admission and switched to levaquin and flagyl at discharge. She did return to the ED once with clogged drains. This time she states that she started feeling worse about 2 days ago. The majority of her pain is in her lower abdomen. She is now having difficulty ambulating due to the pain. Denies nausea or vomiting. Last BM 3-4 days ago and normal. She reports a poor appetite for several days and general fatigue. Denies fever or chills.  In the ED patient was found to be tachycardic up to 122bpm, temp 99.1, BP 129/86. WBC 22.9. Lactic acid 2.3>>1.2. CT scan shows significantly increased size of large abscess within the soft tissues of the lower anterior abdominal wall now measuring up to approximately 21 cm, previously 8 cm - collection contains a pigtail drainage catheter located along the more superior aspect of this collection; additional catheter containing abscess along the dome of the bladder measures up to 8.1 cm, previously measured 7.5 cm; changes of chronic diverticulitis within the sigmoid colon with numerous fistulous connections between the sigmoid colon and abscess cavity along the dome of the urinary bladder; mild bilateral hydronephrosis. Covid test pending. She was started on maxipime, flagyl, and vancomycin. General surgery asked to see.  Abdominal surgical history: hysterectomy Never had a  colonoscopy Anticoagulants: none Nonsmoker Employment: verification specialist   Review of Systems  Constitutional: Positive for malaise/fatigue. Negative for fever.  Respiratory: Negative.   Cardiovascular: Negative.   Gastrointestinal: Positive for abdominal pain. Negative for constipation, diarrhea, nausea and vomiting.  Genitourinary: Negative.     All systems reviewed and otherwise negative except for as above  Family History  Problem Relation Age of Onset  . Cancer Neg Hx     Past Medical History:  Diagnosis Date  . Diverticulitis   . Hypertension     Past Surgical History:  Procedure Laterality Date  . ABDOMINAL HYSTERECTOMY      Social History:  reports that she has never smoked. She has never used smokeless tobacco. She reports that she does not drink alcohol and does not use drugs.  Allergies:  Allergies  Allergen Reactions  . Aspirin     diarheea , stomach ache.     (Not in a hospital admission)   Prior to Admission medications   Medication Sig Start Date End Date Taking? Authorizing Provider  ALPRAZolam Prudy Feeler) 0.5 MG tablet Take 0.5 mg by mouth 3 (three) times daily as needed for anxiety.    [provider]  ferrous sulfate 325 (65 FE) MG tablet Take 1 tablet (325 mg total) by mouth 2 (two) times daily with a meal. 11/10/19 12/10/19  Hughie Closs, MD  hyoscyamine (LEVSIN) 0.125 MG tablet Take 0.125 mg by mouth every 6 (six) hours as needed for bladder spasms or cramping.  10/21/19   [provider]  levofloxacin (LEVAQUIN) 500 MG tablet Take 1 tablet (500 mg total) by mouth daily for 25 days. 11/10/19 12/05/19  Avel Ogawa,  Taysen Bushart A, PA-C  losartan-hydrochlorothiazide (HYZAAR) 50-12.5 MG per tablet Take 1 tablet by mouth daily.    [provider]  metroNIDAZOLE (FLAGYL) 500 MG tablet Take 1 tablet (500 mg total) by mouth 3 (three) times daily for 25 days. 11/10/19 12/05/19  Melany Wiesman A, PA-C  ondansetron (ZOFRAN) 4 MG tablet Take 1 tablet  (4 mg total) by mouth every 6 (six) hours as needed for nausea. 11/17/19   Sheikh, Kateri Mc Latif, DO  polyethylene glycol (MIRALAX / GLYCOLAX) 17 g packet Take 17 g by mouth daily as needed for mild constipation. 11/17/19   Marguerita Merles Latif, DO  promethazine (PHENERGAN) 25 MG tablet Take 25 mg by mouth 2 (two) times daily as needed for nausea or vomiting.  10/21/19   [provider]  saccharomyces boulardii (FLORASTOR) 250 MG capsule Take 1 capsule (250 mg total) by mouth 2 (two) times daily. 11/10/19   Adream Parzych A, PA-C  sodium chloride flush (NS) 0.9 % SOLN Inject 5 mLs into the vein daily. 11/17/19   Marguerita Merles Latif, DO    Blood pressure 129/86, pulse (!) 116, temperature 99.1 F (37.3 C), temperature source Oral, resp. rate (!) 24, height 5' 6.5" (1.689 m), weight 90 kg, SpO2 98 %. Physical Exam: General: pleasant, WD/WN black female who is laying in bed in NAD HEENT: head is normocephalic, atraumatic.  Sclera are noninjected.  PERRL.  Ears and nose without any masses or lesions.  Mouth is pink and moist. Dentition fair Heart: tachycardic, regular rhythm.  Normal s1,s2. No obvious murmurs, gallops, or rubs noted.  Palpable pedal pulses bilaterally  Lungs: CTAB, no wheezes, rhonchi, or rales noted.  Respiratory effort nonlabored Abd: soft, distended particularly below the umbilicus, no rigidity or guarding, two IR drains in place with feculent drainage in bags and surrounding cellulitis, +BS, no masses, hernias, or organomegaly. Tender across the lower abdomen without rebound or guarding MS: no BUE/BLE edema, calves soft and nontender Skin: warm and dry with no masses, lesions, or rashes Psych: A&Ox4 with an appropriate affect Neuro: cranial nerves grossly intact, equal strength in BUE/BLE bilaterally, normal speech, thought process intact  Results for orders placed or performed during the hospital encounter of 11/24/19 (from the past 48 hour(s))  Lactic acid, plasma     Status:  Abnormal   Collection Time: 11/24/19  9:29 AM  Result Value Ref Range   Lactic Acid, Venous 2.3 (HH) 0.5 - 1.9 mmol/L    Comment: CRITICAL RESULT CALLED TO, READ BACK BY AND VERIFIED WITHLoralee Pacas RN [phone] BY A BENNETT Performed at [facility], [address] [place], [place] [address]   Comprehensive metabolic panel     Status: Abnormal   Collection Time: 11/24/19  9:29 AM  Result Value Ref Range   Sodium 135 135 - 145 mmol/L   Potassium 3.7 3.5 - 5.1 mmol/L   Chloride 96 (L) 98 - 111 mmol/L   CO2 25 22 - 32 mmol/L   Glucose, Bld 128 (H) 70 - 99 mg/dL    Comment: Glucose reference range applies only to samples taken after fasting for at least 8 hours.   BUN 12 6 - 20 mg/dL   Creatinine, Ser 2.13 0.44 - 1.00 mg/dL   Calcium 8.3 (L) 8.9 - 10.3 mg/dL   Total Protein 6.8 6.5 - 8.1 g/dL   Albumin 2.0 (L) 3.5 - 5.0 g/dL   AST 16 15 - 41 U/L   ALT 8 0 - 44  U/L   Alkaline Phosphatase 90 38 - 126 U/L   Total Bilirubin 0.8 0.3 - 1.2 mg/dL   GFR calc non Af Amer >60 >60 mL/min   GFR calc Af Amer >60 >60 mL/min   Anion gap 14 5 - 15    Comment: Performed at [facility], [address], [place], [place] [address]  CBC WITH DIFFERENTIAL     Status: Abnormal   Collection Time: 11/24/19  9:29 AM  Result Value Ref Range   WBC 22.9 (H) 4.0 - 10.5 K/uL    Comment: WHITE COUNT CONFIRMED ON SMEAR   RBC 4.44 3.87 - 5.11 MIL/uL   Hemoglobin 10.5 (L) 12.0 - 15.0 g/dL   HCT 02.7 (L) 36 - 46 %   MCV 70.0 (L) 80.0 - 100.0 fL   MCH 23.6 (L) 26.0 - 34.0 pg   MCHC 33.8 30.0 - 36.0 g/dL   RDW 25.3 (H) 66.4 - 40.3 %   Platelets 426 (H) 150 - 400 K/uL   nRBC 0.0 0.0 - 0.2 %   Neutrophils Relative % 75 %   Neutro Abs 17.3 (H) 1.7 - 7.7 K/uL   Lymphocytes Relative 9 %   Lymphs Abs 2.0 0.7 - 4.0 K/uL   Monocytes Relative 11 %   Monocytes Absolute 2.5 (H) 0 - 1 K/uL   Eosinophils Relative 0 %   Eosinophils Absolute 0.0 0 - 0 K/uL   Basophils Relative 0 %   Basophils Absolute  0.0 0 - 0 K/uL   WBC Morphology INCREASED BANDS (>20% BANDS)     Comment: MILD LEFT SHIFT (1-5% METAS, OCC MYELO, OCC BANDS)   Immature Granulocytes 5 %   Abs Immature Granulocytes 1.03 (H) 0.00 - 0.07 K/uL   Target Cells PRESENT     Comment: Performed at [facility], [address], [place], [place] [address]  Protime-INR     Status: Abnormal   Collection Time: 11/24/19  9:29 AM  Result Value Ref Range   Prothrombin Time 16.6 (H) 11.4 - 15.2 seconds   INR 1.4 (H) 0.8 - 1.2    Comment: (NOTE) INR goal varies based on device and disease states. Performed at [facility], [address] [place], [place] [address]   APTT     Status: None   Collection Time: 11/24/19  9:29 AM  Result Value Ref Range   aPTT 36 24 - 36 seconds    Comment: Performed at [facility], [address], [place], [place] [address]  I-Stat beta hCG blood, ED     Status: None   Collection Time: 11/24/19 10:31 AM  Result Value Ref Range   I-stat hCG, quantitative <5.0 <5 mIU/mL   Comment 3            Comment:   GEST. AGE      CONC.  (mIU/mL)   <=1 WEEK        5 - 50     2 WEEKS       50 - 500     3 WEEKS       100 - 10,000     4 WEEKS     1,000 - 30,000        FEMALE AND NON-PREGNANT FEMALE:     LESS THAN 5 mIU/mL   Lactic acid, plasma     Status: None   Collection Time: 11/24/19  1:15 PM  Result Value Ref Range   Lactic Acid,  Venous 1.2 0.5 - 1.9 mmol/L    Comment: Performed at [facility], [address] [place], [place] [address]   CT ABDOMEN PELVIS W CONTRAST  Result Date: 11/24/2019 CLINICAL DATA:  Lower abdominal pain. Patient reports no output from drainage catheters. EXAM: CT ABDOMEN AND PELVIS WITH CONTRAST TECHNIQUE: Multidetector CT imaging of the abdomen and pelvis was performed using the standard protocol following bolus administration of intravenous contrast. CONTRAST:  OMNIPAQUE IOHEXOL 300 MG/ML  SOLN COMPARISON:  11/14/2019 FINDINGS: Lower chest: No acute  abnormality. Hepatobiliary: Stable subcentimeter low-density lesion within the left hepatic lobe, which remains too small to definitively characterize. Liver is otherwise unremarkable. Gallbladder is moderately distended. No hyperdense gallstone. No biliary dilatation. Pancreas: Unremarkable. No pancreatic ductal dilatation or surrounding inflammatory changes. Spleen: Normal in size without focal abnormality. Adrenals/Urinary Tract: Unremarkable adrenal glands. Kidneys enhance symmetrically. There is mild bilateral hydronephrosis. No renal or ureteral calculi are seen. Bladder is moderately distended with thickening of the wall at the bladder dome. No intraluminal air within the bladder. Stomach/Bowel: Stomach and small bowel are within normal limits. No bowel obstruction. Changes of chronic diverticulitis within the sigmoid colon with numerous fistulous connections between the sigmoid colon and abscess cavity along the dome of the urinary bladder (series 6, images 54-67). Vascular/Lymphatic: No significant vascular findings are present. No enlarged abdominal or pelvic lymph nodes. Reproductive: Status post hysterectomy. No adnexal masses. Other: Significantly increased size of rim enhancing fluid and air containing abscess within the soft tissues of the anterior abdominal wall. Collection contains a pigtail drainage catheter located along the more superior aspect of this collection. Collection in total measures approximately 20.8 x 8.0 x 13.0 cm (series 3, image 71; series 7, image 81), previously measured 7.6 x 4.1 x 5.8 cm. Additional abscess along the dome of the bladder measures approximately 8.1 x 4.2 x 6.4 cm (series 7, image 83; series 3, image 62) previously measured 7.5 x 3.4 x 3.7 cm. Pigtail catheter is present within the largest pocket. There are additional smaller abscess pockets insinuated around the sigmoid colon (for example series 3, image 55 and series 6, image 66). Musculoskeletal: No acute or  significant osseous findings. IMPRESSION: 1. Significantly increased size of large abscess within the soft tissues of the lower anterior abdominal wall now measuring up to approximately 21 cm, previously 8 cm. Collection contains a pigtail drainage catheter located along the more superior aspect of this collection. 2. Additional catheter containing abscess along the dome of the bladder measures up to 8.1 cm, previously measured 7.5 cm. 3. Changes of chronic diverticulitis within the sigmoid colon with numerous fistulous connections between the sigmoid colon and abscess cavity along the dome of the urinary bladder. 4. Mild bilateral hydronephrosis. Electronically Signed   By: Duanne Guess D.O.   On: 11/24/2019 13:21      Assessment/Plan HTN - home med Anemia - Hgb 10.5, monitor Does not accept blood products  Diverticulitis with multiple abscess - s/p IR perc drain x 2 11/04/19 - CT today shows significantly increased size of large abscess within the soft tissues of the lower anterior abdominal wall now measuring up to approximately 21 cm, previously 8 cm - collection contains a pigtail drainage catheter located along the more superior aspect of this collection; additional catheter containing abscess along the dome of the bladder measures up to 8.1 cm, previously measured 7.5 cm; changes of chronic diverticulitis within the sigmoid colon with numerous fistulous connections between the sigmoid colon and  abscess cavity along the dome of the urinary bladder; mild bilateral hydronephrosis   ID - maxipime/flagyl/vancomycin x1. Zosyn 8/23>>. Bcx, u/a, Ucx pending VTE - SCDs, lovenox FEN - IVF, CLD, NPO after MN Foley - none Follow up - TBD  Plan - Admit to med-surg, inpatient. Continue IVF hydration, IV zosyn. Unfortunately I do not think that she is getting better with conservative management of her diverticulitis. Recommend surgery to include exploratory laparotomy, partial colectomy, colostomy,  incision and drainage abdominal wall abscess. Will ask WOC team to mark for ostomy. Ok for clear liquids today, NPO after midnight. Check prealbumin. Covid test is pending.  Franne Forts, PA-C [facility] 11/24/2019, 2:14 PM Please see Amion for pager number during day hours 7:00am-4:30pm

## 2019-11-24 NOTE — Progress Notes (Signed)
Pt off the unit.  Unable to get repeat LA at this time, but RN will get once pt returns.

## 2019-11-24 NOTE — ED Notes (Signed)
IV removed per Lelon Mast - RN.

## 2019-11-24 NOTE — ED Provider Notes (Signed)
[name] EMERGENCY DEPARTMENT Provider Note   CSN: [id number] Arrival date & time: 11/24/19  4174     History Chief Complaint  Patient presents with  . Abdominal Pain  . Diverticulitis    [name] is a 54 y.o. female.  HPI 54 year old female presents with lower abdominal swelling and pain. This is her third visit to the ER this month. First was with diverticulitis requiring drain. Second time was a complication of the first. She is still on antibiotics from her most recent discharge on 8/16. However she has noticed more pain, especially with walking over the last 2 days and has had increasing abdominal swelling. The drain bag on her left side is not draining hardly anything. She denies fevers or vomiting.   Past Medical History:  Diagnosis Date  . Diverticulitis   . Hypertension   . Refusal of blood product     Patient Active Problem List   Diagnosis Date Noted  . Diverticulitis of large intestine with abscess 11/14/2019  . Essential hypertension 11/14/2019  . Anemia, chronic disease 11/14/2019  . Severe sepsis (HCC) 11/06/2019  . Diverticulitis 11/02/2019  . Intra-abdominal abscess (HCC) 11/02/2019    Past Surgical History:  Procedure Laterality Date  . ABDOMINAL HYSTERECTOMY       OB History   No obstetric history on file.     Family History  Problem Relation Age of Onset  . Cancer Neg Hx     Social History   Tobacco Use  . Smoking status: Never Smoker  . Smokeless tobacco: Never Used  Substance Use Topics  . Alcohol use: No  . Drug use: No    Home Medications Prior to Admission medications   Medication Sig Start Date End Date Taking? Authorizing Provider  ALPRAZolam Prudy Feeler) 0.5 MG tablet Take 0.5 mg by mouth 3 (three) times daily as needed for anxiety.   Yes [provider]  ferrous sulfate 325 (65 FE) MG tablet Take 1 tablet (325 mg total) by mouth 2 (two) times daily with a meal. 11/10/19 12/10/19 Yes Pahwani,  Daleen Bo, MD  hyoscyamine (LEVSIN) 0.125 MG tablet Take 0.125 mg by mouth every 6 (six) hours as needed for bladder spasms or cramping.  10/21/19  Yes [provider]  levofloxacin (LEVAQUIN) 500 MG tablet Take 1 tablet (500 mg total) by mouth daily for 25 days. 11/10/19 12/05/19 Yes Meuth, Lina Sar, PA-C  losartan-hydrochlorothiazide (HYZAAR) 50-12.5 MG per tablet Take 1 tablet by mouth daily.   Yes [provider]  metroNIDAZOLE (FLAGYL) 500 MG tablet Take 1 tablet (500 mg total) by mouth 3 (three) times daily for 25 days. 11/10/19 12/05/19 Yes Meuth, Brooke A, PA-C  ondansetron (ZOFRAN) 4 MG tablet Take 1 tablet (4 mg total) by mouth every 6 (six) hours as needed for nausea. 11/17/19  Yes Sheikh, Omair Latif, DO  oxyCODONE (OXY IR/ROXICODONE) 5 MG immediate release tablet Take 5 mg by mouth every 6 (six) hours as needed for pain. 11/22/19  Yes [provider]  polyethylene glycol (MIRALAX / GLYCOLAX) 17 g packet Take 17 g by mouth daily as needed for mild constipation. 11/17/19  Yes Sheikh, Omair Latif, DO  promethazine (PHENERGAN) 25 MG tablet Take 25 mg by mouth 2 (two) times daily as needed for nausea or vomiting.  10/21/19  Yes [provider]  saccharomyces boulardii (FLORASTOR) 250 MG capsule Take 1 capsule (250 mg total) by mouth 2 (two) times daily. 11/10/19  Yes Meuth, Brooke A, PA-C  sodium  chloride flush (NS) 0.9 % SOLN Inject 5 mLs into the vein daily. 11/17/19  Yes Sheikh, Omair Latif, DO    Allergies    Aspirin  Review of Systems   Review of Systems  Constitutional: Negative for fever.  Gastrointestinal: Positive for abdominal pain. Negative for vomiting.  All other systems reviewed and are negative.   Physical Exam Updated Vital Signs BP 129/86   Pulse (!) 116   Temp 99.1 F (37.3 C) (Oral)   Resp (!) 24   Ht 5' 6.5" (1.689 m)   Wt 90 kg   SpO2 98%   BMI 31.55 kg/m   Physical Exam Vitals and nursing note reviewed.  Constitutional:       Appearance: She is well-developed. She is not diaphoretic.  HENT:     Head: Normocephalic and atraumatic.     Right Ear: External ear normal.     Left Ear: External ear normal.     Nose: Nose normal.  Eyes:     General:        Right eye: No discharge.        Left eye: No discharge.  Cardiovascular:     Rate and Rhythm: Regular rhythm. Tachycardia present.     Heart sounds: Normal heart sounds.  Pulmonary:     Effort: Pulmonary effort is normal.     Breath sounds: Normal breath sounds.  Abdominal:     Palpations: Abdomen is soft.     Tenderness: There is abdominal tenderness.       Comments: Diffuse lower 1/2 abdominal distention with tenderness. Drains are in place. On the inferior aspect of this is erythema/cellulitis. Foul smell overall.  Skin:    General: Skin is warm and dry.  Neurological:     Mental Status: She is alert.  Psychiatric:        Mood and Affect: Mood is not anxious.     ED Results / Procedures / Treatments   Labs (all labs ordered are listed, but only abnormal results are displayed) Labs Reviewed  LACTIC ACID, PLASMA - Abnormal; Notable for the following components:      Result Value   Lactic Acid, Venous 2.3 (*)    All other components within normal limits  COMPREHENSIVE METABOLIC PANEL - Abnormal; Notable for the following components:   Chloride 96 (*)    Glucose, Bld 128 (*)    Calcium 8.3 (*)    Albumin 2.0 (*)    All other components within normal limits  CBC WITH DIFFERENTIAL/PLATELET - Abnormal; Notable for the following components:   WBC 22.9 (*)    Hemoglobin 10.5 (*)    HCT 31.1 (*)    MCV 70.0 (*)    MCH 23.6 (*)    RDW 18.6 (*)    Platelets 426 (*)    Neutro Abs 17.3 (*)    Monocytes Absolute 2.5 (*)    Abs Immature Granulocytes 1.03 (*)    All other components within normal limits  PROTIME-INR - Abnormal; Notable for the following components:   Prothrombin Time 16.6 (*)    INR 1.4 (*)    All other components within normal  limits  CULTURE, BLOOD (ROUTINE X 2)  CULTURE, BLOOD (ROUTINE X 2)  URINE CULTURE  SARS CORONAVIRUS 2 BY RT PCR (HOSPITAL ORDER, PERFORMED IN [facility])  LACTIC ACID, PLASMA  APTT  URINALYSIS, ROUTINE W REFLEX MICROSCOPIC  PREALBUMIN  I-STAT BETA HCG BLOOD, ED ([facility], [facility], [facility] ONLY)    EKG  EKG Interpretation  Date/Time:  Monday November 24 2019 11:44:10 EDT Ventricular Rate:  117 PR Interval:  136 QRS Duration: 89 QT Interval:  334 QTC Calculation: 466 R Axis:   -11 Text Interpretation: Sinus tachycardia Consider anterior infarct Confirmed by [name] [phone]) on 11/24/2019 12:06:49 PM   Radiology CT ABDOMEN PELVIS W CONTRAST  Result Date: 11/24/2019 CLINICAL DATA:  Lower abdominal pain. Patient reports no output from drainage catheters. EXAM: CT ABDOMEN AND PELVIS WITH CONTRAST TECHNIQUE: Multidetector CT imaging of the abdomen and pelvis was performed using the standard protocol following bolus administration of intravenous contrast. CONTRAST:  OMNIPAQUE IOHEXOL 300 MG/ML  SOLN COMPARISON:  11/14/2019 FINDINGS: Lower chest: No acute abnormality. Hepatobiliary: Stable subcentimeter low-density lesion within the left hepatic lobe, which remains too small to definitively characterize. Liver is otherwise unremarkable. Gallbladder is moderately distended. No hyperdense gallstone. No biliary dilatation. Pancreas: Unremarkable. No pancreatic ductal dilatation or surrounding inflammatory changes. Spleen: Normal in size without focal abnormality. Adrenals/Urinary Tract: Unremarkable adrenal glands. Kidneys enhance symmetrically. There is mild bilateral hydronephrosis. No renal or ureteral calculi are seen. Bladder is moderately distended with thickening of the wall at the bladder dome. No intraluminal air within the bladder. Stomach/Bowel: Stomach and small bowel are within normal limits. No bowel obstruction. Changes of chronic diverticulitis within the sigmoid colon with  numerous fistulous connections between the sigmoid colon and abscess cavity along the dome of the urinary bladder (series 6, images 54-67). Vascular/Lymphatic: No significant vascular findings are present. No enlarged abdominal or pelvic lymph nodes. Reproductive: Status post hysterectomy. No adnexal masses. Other: Significantly increased size of rim enhancing fluid and air containing abscess within the soft tissues of the anterior abdominal wall. Collection contains a pigtail drainage catheter located along the more superior aspect of this collection. Collection in total measures approximately 20.8 x 8.0 x 13.0 cm (series 3, image 71; series 7, image 81), previously measured 7.6 x 4.1 x 5.8 cm. Additional abscess along the dome of the bladder measures approximately 8.1 x 4.2 x 6.4 cm (series 7, image 83; series 3, image 62) previously measured 7.5 x 3.4 x 3.7 cm. Pigtail catheter is present within the largest pocket. There are additional smaller abscess pockets insinuated around the sigmoid colon (for example series 3, image 55 and series 6, image 66). Musculoskeletal: No acute or significant osseous findings. IMPRESSION: 1. Significantly increased size of large abscess within the soft tissues of the lower anterior abdominal wall now measuring up to approximately 21 cm, previously 8 cm. Collection contains a pigtail drainage catheter located along the more superior aspect of this collection. 2. Additional catheter containing abscess along the dome of the bladder measures up to 8.1 cm, previously measured 7.5 cm. 3. Changes of chronic diverticulitis within the sigmoid colon with numerous fistulous connections between the sigmoid colon and abscess cavity along the dome of the urinary bladder. 4. Mild bilateral hydronephrosis. Electronically Signed   By: Duanne Guess D.O.   On: 11/24/2019 13:21    Procedures Ultrasound ED Peripheral IV (Provider)  Date/Time: 11/24/2019 10:01 AM Performed by: [name],  MD Authorized by: [name], MD   Procedure details:    Indications: multiple failed IV attempts and poor IV access     Skin Prep: chlorhexidine gluconate     Location:  Left AC   Angiocath:  20 G   Bedside Ultrasound Guided: Yes     Patient tolerated procedure without complications: Yes     Dressing applied: Yes    .  Critical Care Performed by: [name], MD Authorized by: [name], MD   Critical care provider statement:    Critical care time (minutes):  35   Critical care time was exclusive of:  Separately billable procedures and treating other patients   Critical care was necessary to treat or prevent imminent or life-threatening deterioration of the following conditions:  Sepsis   Critical care was time spent personally by me on the following activities:  Discussions with consultants, evaluation of patient's response to treatment, examination of patient, ordering and performing treatments and interventions, ordering and review of laboratory studies, ordering and review of radiographic studies, pulse oximetry, re-evaluation of patient's condition, obtaining history from patient or surrogate and review of old charts   (including critical care time)  Medications Ordered in ED Medications  vancomycin (VANCOCIN) IVPB 1000 mg/200 mL premix (has no administration in time range)  0.9 %  sodium chloride infusion ( Intravenous New Bag/Given 11/24/19 1428)  piperacillin-tazobactam (ZOSYN) IVPB 3.375 g (3.375 g Intravenous New Bag/Given 11/24/19 1429)  acetaminophen (TYLENOL) tablet 650 mg (has no administration in time range)    Or  acetaminophen (TYLENOL) suppository 650 mg (has no administration in time range)  morphine 2 MG/ML injection 2 mg (has no administration in time range)  oxyCODONE (Oxy IR/ROXICODONE) immediate release tablet 5-10 mg (has no administration in time range)  methocarbamol (ROBAXIN) 500 mg in dextrose 5 % 50 mL IVPB (has no administration in time  range)  diphenhydrAMINE (BENADRYL) capsule 25 mg (has no administration in time range)    Or  diphenhydrAMINE (BENADRYL) injection 25 mg (has no administration in time range)  ondansetron (ZOFRAN-ODT) disintegrating tablet 4 mg (has no administration in time range)    Or  ondansetron (ZOFRAN) injection 4 mg (has no administration in time range)  pantoprazole (PROTONIX) injection 40 mg (has no administration in time range)  metoprolol tartrate (LOPRESSOR) injection 5 mg (has no administration in time range)  hydrochlorothiazide (MICROZIDE) capsule 12.5 mg (12.5 mg Oral Given 11/24/19 1457)  losartan (COZAAR) tablet 50 mg (50 mg Oral Given 11/24/19 1456)  HYDROmorphone (DILAUDID) injection 1 mg (has no administration in time range)  fentaNYL (SUBLIMAZE) injection 100 mcg (100 mcg Intravenous Given 11/24/19 1011)  lactated ringers bolus 1,000 mL (1,000 mLs Intravenous New Bag/Given 11/24/19 1041)  vancomycin (VANCOREADY) IVPB 1500 mg/300 mL (0 mg Intravenous Stopped 11/24/19 1418)  ceFEPIme (MAXIPIME) 2 g in sodium chloride 0.9 % 100 mL IVPB (0 g Intravenous Stopped 11/24/19 1130)  metroNIDAZOLE (FLAGYL) IVPB 500 mg (0 mg Intravenous Stopped 11/24/19 1400)  HYDROmorphone (DILAUDID) injection 1 mg (1 mg Intravenous Given 11/24/19 1141)  lactated ringers bolus 1,000 mL (1,000 mLs Intravenous New Bag/Given 11/24/19 1427)  iohexol (OMNIPAQUE) 300 MG/ML solution 100 mL (100 mLs Intravenous Contrast Given 11/24/19 1254)  enoxaparin (LOVENOX) injection 40 mg (40 mg Subcutaneous Given 11/24/19 1426)  HYDROmorphone (DILAUDID) injection 1 mg (1 mg Intravenous Given 11/24/19 1456)    ED Course  I have reviewed the triage vital signs and the nursing notes.  Pertinent labs & imaging results that were available during my care of the patient were reviewed by me and considered in my medical decision making (see chart for details).    MDM Rules/Calculators/A&P                          Patient CT confirms enlarging  abscess.  She was given broad IV antibiotics and treated with IV fluids.  She  meets criteria for sepsis as well.  Otherwise, her blood pressure has improved from a initial soft BP.  She does not appear to have septic shock.  General surgery was consulted and will admit and take to the OR. Final Clinical Impression(s) / ED Diagnoses Final diagnoses:  Intra-abdominal abscess [facility])    Rx / DC Orders ED Discharge Orders    None       [name], MD 11/24/19 [phone]

## 2019-11-24 NOTE — Consult Note (Signed)
WOC Nurse requested for preoperative stoma site marking  Discussed surgical procedure and stoma creation with patient and family.  Explained role of the WOC nurse team.   Examined patient lying, sitting (as best as possible in the ED in order to place the marking in the patient's visual field, away from any creases or abdominal contour issues and within the rectus muscle. Difficult marking due to drains present in the left and right lower abdominal quadrants with active drainage of feculent appearing drainage and pain.   Marked for colostomy in the LLQ  __4.5__ cm to the left of the umbilicus and _4___cm below the umbilicus.  Marked for ileostomy in the RLQ  _3.0___cm to the right of the umbilicus and  _2.5___ cm below the umbilicus.   Patient's abdomen cleansed with CHG wipes at site markings, allowed to air dry prior to marking.Covered mark with thin film transparent dressing to preserve mark until date of surgery.   WOC Nurse team will follow up with patient after surgery for continue ostomy care and teaching.  Wendell Nicoson [facility] MSN, RN,CWOCN, CNS, [employer] [phone]

## 2019-11-24 NOTE — Progress Notes (Signed)
Pharmacy Antibiotic Note  [name] is a 54 y.o. female admitted on 11/24/2019 with abdominal drains with purulence (hx diverticulitis w/abscess with continued abx tx on levaquin/flagyl PTA).  Pharmacy has been consulted for vancomycin dosing.    Plan: Vancomycin 1500 mg IV x 1, then 1000 mg IV every 8 hours  (target vancomycin trough 15-20) Monitor renal function, Cx and ability to narrow Vancomycin trough at steady state  Height: 5' 6.5" (168.9 cm) Weight: 90 kg (198 lb 6.6 oz) IBW/kg (Calculated) : 60.45  No data recorded.  No results for input(s): WBC, CREATININE, LATICACIDVEN, VANCOTROUGH, VANCOPEAK, VANCORANDOM, GENTTROUGH, GENTPEAK, GENTRANDOM, TOBRATROUGH, TOBRAPEAK, TOBRARND, AMIKACINPEAK, AMIKACINTROU, AMIKACIN in the last 168 hours.  Estimated Creatinine Clearance: 90.7 mL/min (by C-G formula based on SCr of 0.62 mg/dL).    Allergies  Allergen Reactions   Aspirin     diarheea , stomach ache.     Daylene Posey, PharmD Clinical Pharmacist ED Pharmacist Phone # [phone] 11/24/2019 9:37 AM

## 2019-11-24 NOTE — ED Notes (Signed)
Report given to floor

## 2019-11-25 ENCOUNTER — Other Ambulatory Visit: Payer: Commercial Managed Care - PPO

## 2019-11-25 ENCOUNTER — Inpatient Hospital Stay (HOSPITAL_COMMUNITY): Payer: Commercial Managed Care - PPO | Admitting: Anesthesiology

## 2019-11-25 ENCOUNTER — Encounter (HOSPITAL_COMMUNITY): Payer: Self-pay

## 2019-11-25 ENCOUNTER — Inpatient Hospital Stay: Admission: RE | Admit: 2019-11-25 | Payer: Commercial Managed Care - PPO | Source: Ambulatory Visit

## 2019-11-25 ENCOUNTER — Encounter (HOSPITAL_COMMUNITY): Admission: EM | Disposition: A | Discharge status: Home Health | Payer: Self-pay | Source: Home / Self Care

## 2019-11-25 ENCOUNTER — Inpatient Hospital Stay: Payer: Self-pay

## 2019-11-25 HISTORY — PX: BOWEL RESECTION: SHX1257

## 2019-11-25 HISTORY — PX: LAPAROTOMY: SHX154

## 2019-11-25 HISTORY — PX: COLOSTOMY: SHX63

## 2019-11-25 HISTORY — PX: INCISION AND DRAINAGE ABSCESS: SHX5864

## 2019-11-25 HISTORY — PX: COLECTOMY: SHX59

## 2019-11-25 LAB — CBC
HCT: 24.4 % — ABNORMAL LOW (ref 36.0–46.0)
HCT: 23.7 % — ABNORMAL LOW (ref 36.0–46.0)
Hemoglobin: 8.5 g/dL — ABNORMAL LOW (ref 12.0–15.0)
Hemoglobin: 8.2 g/dL — ABNORMAL LOW (ref 12.0–15.0)
MCH: 23.9 pg — ABNORMAL LOW (ref 26.0–34.0)
MCH: 24 pg — ABNORMAL LOW (ref 26.0–34.0)
MCHC: 34.8 g/dL (ref 30.0–36.0)
MCHC: 34.6 g/dL (ref 30.0–36.0)
MCV: 68.7 fL — ABNORMAL LOW (ref 80.0–100.0)
MCV: 69.3 fL — ABNORMAL LOW (ref 80.0–100.0)
Platelets: 337 10*3/uL (ref 150–400)
Platelets: 420 10*3/uL — ABNORMAL HIGH (ref 150–400)
RBC: 3.55 MIL/uL — ABNORMAL LOW (ref 3.87–5.11)
RBC: 3.42 MIL/uL — ABNORMAL LOW (ref 3.87–5.11)
RDW: 18.2 % — ABNORMAL HIGH (ref 11.5–15.5)
RDW: 18.5 % — ABNORMAL HIGH (ref 11.5–15.5)
WBC: 17.5 10*3/uL — ABNORMAL HIGH (ref 4.0–10.5)
WBC: 42.3 10*3/uL — ABNORMAL HIGH (ref 4.0–10.5)
nRBC: 0 % (ref 0.0–0.2)
nRBC: 0 % (ref 0.0–0.2)

## 2019-11-25 LAB — COMPREHENSIVE METABOLIC PANEL
ALT: 8 U/L (ref 0–44)
AST: 11 U/L — ABNORMAL LOW (ref 15–41)
Albumin: 1.5 g/dL — ABNORMAL LOW (ref 3.5–5.0)
Alkaline Phosphatase: 64 U/L (ref 38–126)
Anion gap: 11 (ref 5–15)
BUN: 10 mg/dL (ref 6–20)
CO2: 26 mmol/L (ref 22–32)
Calcium: 8 mg/dL — ABNORMAL LOW (ref 8.9–10.3)
Chloride: 101 mmol/L (ref 98–111)
Creatinine, Ser: 0.6 mg/dL (ref 0.44–1.00)
GFR calc Af Amer: >60 mL/min (ref >60)
GFR calc non Af Amer: >60 mL/min (ref >60)
Glucose, Bld: 108 mg/dL — ABNORMAL HIGH (ref 70–99)
Potassium: 3.1 mmol/L — ABNORMAL LOW (ref 3.5–5.1)
Sodium: 138 mmol/L (ref 135–145)
Total Bilirubin: 0.5 mg/dL (ref 0.3–1.2)
Total Protein: 5.4 g/dL — ABNORMAL LOW (ref 6.5–8.1)

## 2019-11-25 LAB — BASIC METABOLIC PANEL
Anion gap: 14 (ref 5–15)
BUN: 12 mg/dL (ref 6–20)
CO2: 20 mmol/L — ABNORMAL LOW (ref 22–32)
Calcium: 7.8 mg/dL — ABNORMAL LOW (ref 8.9–10.3)
Chloride: 106 mmol/L (ref 98–111)
Creatinine, Ser: 0.96 mg/dL (ref 0.44–1.00)
GFR calc Af Amer: >60 mL/min (ref >60)
GFR calc non Af Amer: >60 mL/min (ref >60)
Glucose, Bld: 237 mg/dL — ABNORMAL HIGH (ref 70–99)
Potassium: 3.5 mmol/L (ref 3.5–5.1)
Sodium: 140 mmol/L (ref 135–145)

## 2019-11-25 LAB — GLUCOSE, CAPILLARY: Glucose-Capillary: 204 mg/dL — ABNORMAL HIGH (ref 70–99)

## 2019-11-25 LAB — NO BLOOD PRODUCTS

## 2019-11-25 LAB — URINE CULTURE: Culture: NO GROWTH

## 2019-11-25 SURGERY — LAPAROTOMY, EXPLORATORY
Anesthesia: General | Site: Abdomen

## 2019-11-25 MED ORDER — DEXAMETHASONE SODIUM PHOSPHATE 10 MG/ML IJ SOLN
INTRAMUSCULAR | Status: DC | PRN
  Administered 2019-11-25: 10 mg via INTRAVENOUS

## 2019-11-25 MED ORDER — ROCURONIUM BROMIDE 10 MG/ML (PF) SYRINGE
PREFILLED_SYRINGE | INTRAVENOUS | Status: AC
  Filled 2019-11-25: qty 10

## 2019-11-25 MED ORDER — LACTATED RINGERS IV SOLN: INTRAVENOUS | Status: DC

## 2019-11-25 MED ORDER — NALOXONE HCL 0.4 MG/ML IJ SOLN: 0.4000 mg | INTRAMUSCULAR | Status: DC | PRN

## 2019-11-25 MED ORDER — MIDAZOLAM HCL 2 MG/2ML IJ SOLN
INTRAMUSCULAR | Status: AC
  Filled 2019-11-25: qty 2

## 2019-11-25 MED ORDER — LIDOCAINE 2% (20 MG/ML) 5 ML SYRINGE
INTRAMUSCULAR | Status: DC | PRN
  Administered 2019-11-25: 80 mg via INTRAVENOUS

## 2019-11-25 MED ORDER — ACETAMINOPHEN 325 MG PO TABS: 325.0000 mg | ORAL_TABLET | ORAL | Status: DC | PRN

## 2019-11-25 MED ORDER — OXYCODONE HCL 5 MG PO TABS: 5.0000 mg | ORAL_TABLET | Freq: Once | ORAL | Status: DC | PRN

## 2019-11-25 MED ORDER — ROCURONIUM BROMIDE 10 MG/ML (PF) SYRINGE
PREFILLED_SYRINGE | INTRAVENOUS | Status: DC | PRN
  Administered 2019-11-25: 30 mg via INTRAVENOUS
  Administered 2019-11-25: 70 mg via INTRAVENOUS
  Administered 2019-11-25 (×2): 10 mg via INTRAVENOUS

## 2019-11-25 MED ORDER — PROPOFOL 10 MG/ML IV BOLUS
INTRAVENOUS | Status: DC | PRN
  Administered 2019-11-25: 130 mg via INTRAVENOUS

## 2019-11-25 MED ORDER — LACTATED RINGERS IV BOLUS
500.0000 mL | Freq: Once | INTRAVENOUS | Status: AC
  Administered 2019-11-25: 500 mL via INTRAVENOUS

## 2019-11-25 MED ORDER — FENTANYL CITRATE (PF) 100 MCG/2ML IJ SOLN: 25.0000 ug | INTRAMUSCULAR | Status: DC | PRN

## 2019-11-25 MED ORDER — PHENYLEPHRINE 40 MCG/ML (10ML) SYRINGE FOR IV PUSH (FOR BLOOD PRESSURE SUPPORT)
PREFILLED_SYRINGE | INTRAVENOUS | Status: AC
  Filled 2019-11-25: qty 10

## 2019-11-25 MED ORDER — MEPERIDINE HCL 25 MG/ML IJ SOLN: 6.2500 mg | INTRAMUSCULAR | Status: DC | PRN

## 2019-11-25 MED ORDER — HYDROMORPHONE 1 MG/ML IV SOLN
INTRAVENOUS | Status: DC
  Administered 2019-11-25: 30 mg via INTRAVENOUS
  Administered 2019-11-25: 2.4 mg via INTRAVENOUS
  Administered 2019-11-26: 0 mg via INTRAVENOUS
  Administered 2019-11-26: 0.6 mg via INTRAVENOUS
  Administered 2019-11-26: 1.8 mg via INTRAVENOUS

## 2019-11-25 MED ORDER — ACETAMINOPHEN 10 MG/ML IV SOLN
1000.0000 mg | Freq: Four times a day (QID) | INTRAVENOUS | Status: AC
  Filled 2019-11-25: qty 100

## 2019-11-25 MED ORDER — DIPHENHYDRAMINE HCL 50 MG/ML IJ SOLN: 12.5000 mg | Freq: Four times a day (QID) | INTRAMUSCULAR | Status: DC | PRN

## 2019-11-25 MED ORDER — ONDANSETRON HCL 4 MG/2ML IJ SOLN: 4.0000 mg | Freq: Once | INTRAMUSCULAR | Status: DC | PRN

## 2019-11-25 MED ORDER — DEXAMETHASONE SODIUM PHOSPHATE 10 MG/ML IJ SOLN
INTRAMUSCULAR | Status: AC
  Filled 2019-11-25: qty 1

## 2019-11-25 MED ORDER — INSULIN ASPART 100 UNIT/ML ~~LOC~~ SOLN
0.0000 [IU] | SUBCUTANEOUS | Status: DC
  Administered 2019-11-26 (×4): 2 [IU] via SUBCUTANEOUS
  Administered 2019-11-27: 1 [IU] via SUBCUTANEOUS
  Administered 2019-11-27: 2 [IU] via SUBCUTANEOUS

## 2019-11-25 MED ORDER — MIDAZOLAM HCL 5 MG/5ML IJ SOLN
INTRAMUSCULAR | Status: DC | PRN
  Administered 2019-11-25: 2 mg via INTRAVENOUS

## 2019-11-25 MED ORDER — CHLORHEXIDINE GLUCONATE 0.12 % MT SOLN
OROMUCOSAL | Status: AC
  Administered 2019-11-25: 15 mL via OROMUCOSAL
  Filled 2019-11-25: qty 15

## 2019-11-25 MED ORDER — LACTATED RINGERS IV SOLN: INTRAVENOUS | Status: DC | PRN

## 2019-11-25 MED ORDER — OXYCODONE HCL 5 MG/5ML PO SOLN: 5.0000 mg | Freq: Once | ORAL | Status: DC | PRN

## 2019-11-25 MED ORDER — LIDOCAINE 2% (20 MG/ML) 5 ML SYRINGE
INTRAMUSCULAR | Status: AC
  Filled 2019-11-25: qty 5

## 2019-11-25 MED ORDER — DIPHENHYDRAMINE HCL 12.5 MG/5ML PO ELIX: 12.5000 mg | ORAL_SOLUTION | Freq: Four times a day (QID) | ORAL | Status: DC | PRN

## 2019-11-25 MED ORDER — CHLORHEXIDINE GLUCONATE 0.12 % MT SOLN: 15.0000 mL | Freq: Once | OROMUCOSAL | Status: AC

## 2019-11-25 MED ORDER — FLUCONAZOLE IN SODIUM CHLORIDE 400-0.9 MG/200ML-% IV SOLN
400.0000 mg | INTRAVENOUS | Status: AC
  Administered 2019-11-25 – 2019-11-29 (×5): 400 mg via INTRAVENOUS
  Filled 2019-11-25 (×5): qty 200

## 2019-11-25 MED ORDER — ALBUMIN HUMAN 5 % IV SOLN: INTRAVENOUS | Status: DC | PRN

## 2019-11-25 MED ORDER — 0.9 % SODIUM CHLORIDE (POUR BTL) OPTIME
TOPICAL | Status: DC | PRN
  Administered 2019-11-25: 2000 mL
  Administered 2019-11-25: 5000 mL

## 2019-11-25 MED ORDER — POTASSIUM CHLORIDE 10 MEQ/100ML IV SOLN
10.0000 meq | INTRAVENOUS | Status: AC
  Administered 2019-11-25 (×4): 10 meq via INTRAVENOUS
  Filled 2019-11-25 (×2): qty 100

## 2019-11-25 MED ORDER — ACETAMINOPHEN 160 MG/5ML PO SOLN: 325.0000 mg | ORAL | Status: DC | PRN

## 2019-11-25 MED ORDER — SUGAMMADEX SODIUM 200 MG/2ML IV SOLN
INTRAVENOUS | Status: DC | PRN
  Administered 2019-11-25: 200 mg via INTRAVENOUS

## 2019-11-25 MED ORDER — PROPOFOL 10 MG/ML IV BOLUS
INTRAVENOUS | Status: AC
  Filled 2019-11-25: qty 20

## 2019-11-25 MED ORDER — SODIUM CHLORIDE 0.9 % IR SOLN
Status: DC | PRN
  Administered 2019-11-25: 3000 mL

## 2019-11-25 MED ORDER — PHENYLEPHRINE HCL (PRESSORS) 10 MG/ML IV SOLN
INTRAVENOUS | Status: DC | PRN
  Administered 2019-11-25: 120 ug via INTRAVENOUS
  Administered 2019-11-25: 80 ug via INTRAVENOUS
  Administered 2019-11-25 (×2): 120 ug via INTRAVENOUS

## 2019-11-25 MED ORDER — SODIUM CHLORIDE 0.9 % IV SOLN: INTRAVENOUS | Status: DC

## 2019-11-25 MED ORDER — ONDANSETRON HCL 4 MG/2ML IJ SOLN
INTRAMUSCULAR | Status: DC | PRN
  Administered 2019-11-25: 4 mg via INTRAVENOUS

## 2019-11-25 MED ORDER — ONDANSETRON HCL 4 MG/2ML IJ SOLN
INTRAMUSCULAR | Status: AC
  Filled 2019-11-25: qty 2

## 2019-11-25 MED ORDER — FENTANYL CITRATE (PF) 250 MCG/5ML IJ SOLN
INTRAMUSCULAR | Status: AC
  Filled 2019-11-25: qty 5

## 2019-11-25 MED ORDER — ONDANSETRON HCL 4 MG/2ML IJ SOLN: 4.0000 mg | Freq: Four times a day (QID) | INTRAMUSCULAR | Status: DC | PRN

## 2019-11-25 MED ORDER — CHLORHEXIDINE GLUCONATE CLOTH 2 % EX PADS
6.0000 | MEDICATED_PAD | Freq: Every day | CUTANEOUS | Status: DC
  Administered 2019-11-25 – 2019-12-11 (×17): 6 via TOPICAL

## 2019-11-25 MED ORDER — METHOCARBAMOL 1000 MG/10ML IJ SOLN
500.0000 mg | Freq: Four times a day (QID) | INTRAVENOUS | Status: DC
  Administered 2019-11-25 – 2019-11-27 (×9): 500 mg via INTRAVENOUS
  Filled 2019-11-25 (×15): qty 5

## 2019-11-25 MED ORDER — SODIUM CHLORIDE 0.9% FLUSH: 9.0000 mL | INTRAVENOUS | Status: DC | PRN

## 2019-11-25 MED ORDER — SODIUM CHLORIDE 0.9 % IV SOLN: INTRAVENOUS | Status: AC

## 2019-11-25 MED ORDER — HYDROMORPHONE 1 MG/ML IV SOLN
INTRAVENOUS | Status: AC
  Filled 2019-11-25: qty 30

## 2019-11-25 MED ORDER — FENTANYL CITRATE (PF) 100 MCG/2ML IJ SOLN
INTRAMUSCULAR | Status: DC | PRN
Start: 2019-11-25 — End: 2019-11-25
  Administered 2019-11-25 (×2): 100 ug via INTRAVENOUS
  Administered 2019-11-25: 50 ug via INTRAVENOUS

## 2019-11-25 MED ORDER — TRAVASOL 10 % IV SOLN
INTRAVENOUS | Status: AC
  Filled 2019-11-25: qty 451.2

## 2019-11-25 SURGICAL SUPPLY — 77 items
APL PRP STRL LF DISP 70% ISPRP (MISCELLANEOUS) ×1
BLADE CLIPPER SURG (BLADE) IMPLANT
BNDG GAUZE ELAST 4 BULKY (GAUZE/BANDAGES/DRESSINGS) ×4 IMPLANT
BRIEF STRETCH FOR OB PAD LRG (UNDERPADS AND DIAPERS) IMPLANT
CANISTER SUCT 3000ML PPV (MISCELLANEOUS) ×2 IMPLANT
CELLS DAT CNTRL 66122 CELL SVR (MISCELLANEOUS) IMPLANT
CHLORAPREP W/TINT 26 (MISCELLANEOUS) ×2 IMPLANT
COVER MAYO STAND STRL (DRAPES) ×4 IMPLANT
COVER SURGICAL LIGHT HANDLE (MISCELLANEOUS) ×2 IMPLANT
COVER WAND RF STERILE (DRAPES) ×2 IMPLANT
DRAPE HALF SHEET 40X57 (DRAPES) ×4 IMPLANT
DRAPE LAPAROSCOPIC ABDOMINAL (DRAPES) ×2 IMPLANT
DRAPE LAPAROTOMY 100X72 PEDS (DRAPES) IMPLANT
DRAPE UTILITY XL STRL (DRAPES) ×10 IMPLANT
DRAPE WARM FLUID 44X44 (DRAPES) ×2 IMPLANT
DRSG OPSITE POSTOP 4X10 (GAUZE/BANDAGES/DRESSINGS) IMPLANT
DRSG OPSITE POSTOP 4X8 (GAUZE/BANDAGES/DRESSINGS) IMPLANT
DRSG PAD ABDOMINAL 8X10 ST (GAUZE/BANDAGES/DRESSINGS) IMPLANT
ELECT BLADE 6.5 EXT (BLADE) ×2 IMPLANT
ELECT CAUTERY BLADE 6.4 (BLADE) ×4 IMPLANT
ELECT REM PT RETURN 9FT ADLT (ELECTROSURGICAL) ×2
ELECTRODE REM PT RTRN 9FT ADLT (ELECTROSURGICAL) ×1 IMPLANT
GAUZE PACKING IODOFORM 1/2 (PACKING) IMPLANT
GAUZE PACKING IODOFORM 1/4X15 (PACKING) IMPLANT
GAUZE SPONGE 4X4 12PLY STRL (GAUZE/BANDAGES/DRESSINGS) ×2 IMPLANT
GAUZE SPONGE 4X4 12PLY STRL LF (GAUZE/BANDAGES/DRESSINGS) ×2 IMPLANT
GLOVE BIO SURGEON STRL SZ 6 (GLOVE) ×4 IMPLANT
GLOVE INDICATOR 6.5 STRL GRN (GLOVE) ×4 IMPLANT
GOWN STRL REUS W/ TWL LRG LVL3 (GOWN DISPOSABLE) ×4 IMPLANT
GOWN STRL REUS W/TWL 2XL LVL3 (GOWN DISPOSABLE) ×4 IMPLANT
GOWN STRL REUS W/TWL LRG LVL3 (GOWN DISPOSABLE) ×8
HANDLE SUCTION POOLE (INSTRUMENTS) ×1 IMPLANT
HANDPIECE INTERPULSE COAX TIP (DISPOSABLE) ×2
KIT BASIN OR (CUSTOM PROCEDURE TRAY) ×2 IMPLANT
KIT OSTOMY DRAINABLE 2.75 STR (WOUND CARE) ×2 IMPLANT
KIT SIGMOIDOSCOPE (SET/KITS/TRAYS/PACK) IMPLANT
KIT TURNOVER KIT B (KITS) ×2 IMPLANT
LEGGING LITHOTOMY PAIR STRL (DRAPES) IMPLANT
LIGASURE IMPACT 36 18CM CVD LR (INSTRUMENTS) ×2 IMPLANT
NS IRRIG 1000ML POUR BTL (IV SOLUTION) ×4 IMPLANT
PACK GENERAL/GYN (CUSTOM PROCEDURE TRAY) ×2 IMPLANT
PAD ABD 8X10 STRL (GAUZE/BANDAGES/DRESSINGS) ×4 IMPLANT
PAD ARMBOARD 7.5X6 YLW CONV (MISCELLANEOUS) ×4 IMPLANT
PENCIL BUTTON HOLSTER BLD 10FT (ELECTRODE) ×2 IMPLANT
PENCIL SMOKE EVACUATOR (MISCELLANEOUS) ×2 IMPLANT
RELOAD PROXIMATE 75MM BLUE (ENDOMECHANICALS) ×6 IMPLANT
RTRCTR WOUND ALEXIS 18CM MED (MISCELLANEOUS)
SET HNDPC FAN SPRY TIP SCT (DISPOSABLE) ×1 IMPLANT
SPECIMEN JAR LARGE (MISCELLANEOUS) IMPLANT
SPECIMEN JAR X LARGE (MISCELLANEOUS) ×2 IMPLANT
SPONGE LAP 18X18 RF (DISPOSABLE) IMPLANT
SPONGE LAP 18X18 X RAY DECT (DISPOSABLE) ×16 IMPLANT
STAPLER CUT CVD 40MM BLUE (STAPLE) ×2 IMPLANT
STAPLER GUN LINEAR PROX 60 (STAPLE) ×2 IMPLANT
STAPLER PROXIMATE 75MM BLUE (STAPLE) ×2 IMPLANT
STAPLER VISISTAT 35W (STAPLE) ×2 IMPLANT
SUCTION POOLE HANDLE (INSTRUMENTS) ×2
SURGILUBE 2OZ TUBE FLIPTOP (MISCELLANEOUS) IMPLANT
SUT PDS AB 1 TP1 96 (SUTURE) ×4 IMPLANT
SUT PDS II 0 TP-1 LOOPED 60 (SUTURE) ×4 IMPLANT
SUT PROLENE 2 0 CT2 30 (SUTURE) IMPLANT
SUT PROLENE 2 0 KS (SUTURE) IMPLANT
SUT PROLENE 2 0 SH 30 (SUTURE) ×2 IMPLANT
SUT VIC AB 2-0 SH 18 (SUTURE) ×4 IMPLANT
SUT VIC AB 3-0 SH 18 (SUTURE) ×2 IMPLANT
SUT VICRYL 4-0 PS2 18IN ABS (SUTURE) IMPLANT
SUT VICRYL AB 2 0 TIES (SUTURE) ×2 IMPLANT
SUT VICRYL AB 3 0 TIES (SUTURE) ×2 IMPLANT
SYR BULB IRRIG 60ML STRL (SYRINGE) ×2 IMPLANT
TAPE CLOTH SURG 4X10 WHT LF (GAUZE/BANDAGES/DRESSINGS) ×2 IMPLANT
TOWEL GREEN STERILE (TOWEL DISPOSABLE) ×4 IMPLANT
TOWEL GREEN STERILE FF (TOWEL DISPOSABLE) ×2 IMPLANT
TRAY FOLEY MTR SLVR 14FR STAT (SET/KITS/TRAYS/PACK) IMPLANT
TRAY FOLEY MTR SLVR 16FR STAT (SET/KITS/TRAYS/PACK) IMPLANT
TUBE CONNECTING 12X1/4 (SUCTIONS) ×2 IMPLANT
UNDERPAD 30X36 HEAVY ABSORB (UNDERPADS AND DIAPERS) IMPLANT
YANKAUER SUCT BULB TIP NO VENT (SUCTIONS) ×2 IMPLANT

## 2019-11-25 NOTE — Anesthesia Procedure Notes (Signed)
Procedure Name: Intubation Date/Time: 11/25/2019 12:31 PM Performed by: Inda Coke, CRNA Pre-anesthesia Checklist: Patient identified, Emergency Drugs available, Suction available and Patient being monitored Patient Re-evaluated:Patient Re-evaluated prior to induction Oxygen Delivery Method: Circle System Utilized Preoxygenation: Pre-oxygenation with 100% oxygen Induction Type: IV induction Ventilation: Mask ventilation without difficulty Laryngoscope Size: Mac and 3 Grade View: Grade I Tube type: Oral Tube size: 7.0 mm Number of attempts: 1 Airway Equipment and Method: Stylet and Oral airway Placement Confirmation: ETT inserted through vocal cords under direct vision,  positive ETCO2 and breath sounds checked- equal and bilateral Secured at: 21 cm Tube secured with: Tape Dental Injury: Teeth and Oropharynx as per pre-operative assessment

## 2019-11-25 NOTE — Progress Notes (Signed)
I have spoken with Central monitoring and pt is back on cardiac monitoring with a sinus tach rate of 110s.  Pt is alert and oriented, states pain is at a manageable level at present.  O2 at 1L nasal cannula, sat is 100%.

## 2019-11-25 NOTE — Op Note (Signed)
PREOPERATIVE DIAGNOSIS: Perforated diverticulitis.   POSTOPERATIVE DIAGNOSIS: Perforated diverticulitis.   PROCEDURE PERFORMED: Open sigmoid colectomy and end colostomy (Hartmann's procedure), small bowel resection, incision and drainage of abdominal wall abscess with debridement of skin and subcutaneous fatty tissue  SURGEON: Almond Lint, MD   ASSISTANT: Carlena Bjornstad, PA-C  ANESTHESIA: General.   FINDINGS: feculent peritonitis with purulent drainage present upon entry unto abdominal cavity generalized to lower abdomen and pelvis.  Numerous diverticuli along sigmoid colon.  ESTIMATED BLOOD LOSS: 500 mL.  Additional 950 mL of stool from abdominal wall abscess  COMPLICATIONS: None known.   PROCEDURE:  Patient was identified in the holding area and taken to  the operating room where she was placed supine on the operating room  table. General anesthesia was induced. Foley catheter was placed.  The abdomen was prepped and  draped in a sterile fashion. Time-out was performed according to the  surgical safety check list. When all was correct we continued.   Two drains were in the anterior abdominal wall.  There was stool draining around the drains.  A small incision was made in the skin and the pus/stool was evacuated.  The skin was then incised in the midline and the remaining stool/pus in the anterior abdominal wall was cleaned up with pulse lavage.  Some of the skin was necrotic as it was so tense.  The fascia was then opened in the midline. The skin incision was carried out to above the umbilicus.  The fascia was entered superiorly above the inflammatory changes.  The fascia was then elevated with Kocher clamps while the colon and small bowel were taken off the lower anterior abdominal wall.  The peritoneum was inflamed throughout the lower half of the abdomen with pus and stool.  Sharp dissection was used to take the bowel down. The Balfour self retaining retractor was placed.   Once the  superior rectum/distal sigmoid was located, the contour stapler was used to divide the bowel.    The proximal sigmoid colon was still quite inflamed.  This was taken down laterally sharply.   The white line of Toldt of the descending colon was taken down with the Bovie electrocautery proximally along the descending colon. The Ligasure was used to divide the mesocolon high up near the colon wall starting at the distal stapled end.  Once adequate length of descending colon had been mobilized, the proximal sigmoid was then divided with the GIA.  The abdomen was then irrigated copiously.  There had been a loop of small bowel that was very close to the drain site and the perforation.  This had a thinned out area of serosa. Given the magnitude of inflammation present, it was felt that this would not hold sutures to oversew the serosal defect.  This small segment of ileum, around 3 inches, was resected with the GIA 75 mm stapler, and a side-to-side, functional end-to-end anastamosis was created.    Attention was directed to the left abdomen for a location for an  ostomy. Kochers were used to pull the fascia to the midline and an  another Zannie Cove was used to elevate the skin to create a circular site. This was placed superior to the abscess cavity.  A divot of fatty tissue was taken as well. The fascia was opened in a  cruciate fashion separating the longitudinal fibers of the rectus. A  lap was placed behind the fascia to ensure that the Bovie could not go  through and puncture any of the intra-abdominal  organs. A Tanja Port was  then advanced through the abdominal wall and used to retract the stump  of the descending colon through the abdominal wall.   The abdomen was then closed using running #1 looped PDS sutures. The abscess cavity was irrigated with the pulse lavage.  The necrotic skin was debrided sharply with the cautery.  The wound  was then irrigated and packed with moist betadine soaked Kerlix.   The  colon was then opened with the Bovie.  3-0 Vicryl interrupted sutures were used to create the ostomy. This was then dressed with a 2 piece wafer.   The patient was allowed to emerge from anesthesia and taken to the PACU in stable condition.  Needle, sponge, and instrument counts were correct x 2.

## 2019-11-25 NOTE — Progress Notes (Signed)
Initial Nutrition Assessment  RD working remotely.  DOCUMENTATION CODES:   Obesity unspecified  INTERVENTION:   -TPN management per pharmacy -RD will follow for diet advancement and add supplements as appropriate  NUTRITION DIAGNOSIS:   Increased nutrient needs related to post-op healing as evidenced by estimated needs.  GOAL:   Patient will meet greater than or equal to 90% of their needs  MONITOR:   Diet advancement, Labs, Weight trends, Skin, I & O's  REASON FOR ASSESSMENT:   Consult New TPN/TNA  ASSESSMENT:   [name] is a 54yo female PMH HTN and recurrent diverticulitis who returned to the ED today with 2 days of worsening abdominal pain.  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   8/24- s/p PROCEDURE PERFORMED: Open sigmoid colectomy and end colostomy (Hartmann's procedure), small bowel resection, incision and drainage of abdominal wall abscess with debridement of skin and subcutaneous fatty tissue  Reviewed I/O's: +1.6 L x 24 hours  UOP: 920 ml x 24 hours  Drains: 40 ml x 24 hours  Attempted to speak with pt via call to hospital room phone, however, unable to reach.  Pt NPO. Plan to start TPN today; awaiting PICC placement. Per pharmacy note, plan to initiate TPN at 40 ml/hr at 1800, which will provides 975 kcals and 45 grams protein, meeting 46% of estimated kcal needs and 38% of estimated protein needs.   Reviewed wt hx; pt has experienced a 6.8% wt loss over the past 3 weeks, which is significant for time frame.  Medications reviewed and include 0.9% sodium chloride infusion @ 125 ml/hr and lactated ringers infusion @ 10 ml/hr.    Labs reviewed: K: 3.1. (inpatient orders for glycemic control are 0-9 units insulin aspart every 4 hours)  Diet Order:   Diet Order            Diet NPO time specified Except for: Sips with Meds  Diet effective  midnight                 EDUCATION NEEDS:   No education needs have been identified at this time  Skin:  Skin Assessment: Skin Integrity Issues: Skin Integrity Issues:: Incisions Incisions: closed abdomen  Last BM:  11/21/19  Height:   Ht Readings from Last 1 Encounters:  11/25/19 5' 6.5" (1.689 m)    Weight:   Wt Readings from Last 1 Encounters:  11/25/19 90 kg    Ideal Body Weight:  60.2 kg  BMI:  Body mass index is 31.55 kg/m.  Estimated Nutritional Needs:   Kcal:  2100-2300  Protein:  120-135 grams  Fluid:  > 2.1 L    Levada Schilling, RD, LDN, CDCES Registered Dietitian II Certified Diabetes Care and Education Specialist Please refer to [facility] for RD and/or RD on-call/weekend/after hours pager

## 2019-11-25 NOTE — Progress Notes (Signed)
[facility] Progress Note  Day of Surgery  Subjective: CC-  Comfortable this morning. States that she is ready for surgery.  Continues to have a lot of lower abdominal discomfort. Denies n/v. Marked for ostomy by WOC team.  Objective: Vital signs in last 24 hours: Temp:  [98.6 F (37 C)-99.1 F (37.3 C)] 98.6 F (37 C) (08/24 0458) Pulse Rate:  [99-122] 99 (08/24 0458) Resp:  [17-24] 17 (08/24 0458) BP: (95-129)/(59-90) 95/59 (08/24 0458) SpO2:  [96 %-100 %] 99 % (08/24 0458) Weight:  [90 kg] 90 kg (08/23 0930) Last BM Date: 11/21/19  Intake/Output from previous day: 08/23 0701 - 08/24 0700 In: 2581.4 [P.O.:550; I.V.:1289.2; IV Piggyback:742.2] Out: 960 [Urine:920; Drains:40] Intake/Output this shift: No intake/output data recorded.  PE: Gen:  Alert, NAD, pleasant HEENT: EOM's intact, pupils equal and round Card:  RRR Pulm:  CTAB, no W/R/R, rate and effort normal Abd: soft, distended particularly below the umbilicus, no rigidity or guarding, two IR drains in place with feculent drainage in bags and surrounding cellulitis, +BS. Tender across the lower abdomen without rebound or guarding  Lab Results:  Recent Labs    11/24/19 0929 11/25/19 0432  WBC 22.9* 17.5*  HGB 10.5* 8.5*  HCT 31.1* 24.4*  PLT 426* 337   BMET Recent Labs    11/24/19 0929 11/25/19 0432  NA 135 138  K 3.7 3.1*  CL 96* 101  CO2 25 26  GLUCOSE 128* 108*  BUN 12 10  CREATININE 0.78 0.60  CALCIUM 8.3* 8.0*   PT/INR Recent Labs    11/24/19 0929  LABPROT 16.6*  INR 1.4*   CMP     Component Value Date/Time   NA 138 11/25/2019 0432   K 3.1 (L) 11/25/2019 0432   CL 101 11/25/2019 0432   CO2 26 11/25/2019 0432   GLUCOSE 108 (H) 11/25/2019 0432   BUN 10 11/25/2019 0432   CREATININE 0.60 11/25/2019 0432   CALCIUM 8.0 (L) 11/25/2019 0432   PROT 5.4 (L) 11/25/2019 0432   ALBUMIN 1.5 (L) 11/25/2019 0432   AST 11 (L) 11/25/2019 0432   ALT 8 11/25/2019 0432   ALKPHOS 64  11/25/2019 0432   BILITOT 0.5 11/25/2019 0432   GFRNONAA >60 11/25/2019 0432   GFRAA >60 11/25/2019 0432   Lipase  No results found for: LIPASE     Studies/Results: CT ABDOMEN PELVIS W CONTRAST  Result Date: 11/24/2019 CLINICAL DATA:  Lower abdominal pain. Patient reports no output from drainage catheters. EXAM: CT ABDOMEN AND PELVIS WITH CONTRAST TECHNIQUE: Multidetector CT imaging of the abdomen and pelvis was performed using the standard protocol following bolus administration of intravenous contrast. CONTRAST:  OMNIPAQUE IOHEXOL 300 MG/ML  SOLN COMPARISON:  11/14/2019 FINDINGS: Lower chest: No acute abnormality. Hepatobiliary: Stable subcentimeter low-density lesion within the left hepatic lobe, which remains too small to definitively characterize. Liver is otherwise unremarkable. Gallbladder is moderately distended. No hyperdense gallstone. No biliary dilatation. Pancreas: Unremarkable. No pancreatic ductal dilatation or surrounding inflammatory changes. Spleen: Normal in size without focal abnormality. Adrenals/Urinary Tract: Unremarkable adrenal glands. Kidneys enhance symmetrically. There is mild bilateral hydronephrosis. No renal or ureteral calculi are seen. Bladder is moderately distended with thickening of the wall at the bladder dome. No intraluminal air within the bladder. Stomach/Bowel: Stomach and small bowel are within normal limits. No bowel obstruction. Changes of chronic diverticulitis within the sigmoid colon with numerous fistulous connections between the sigmoid colon and abscess cavity along the dome of the urinary bladder (series  6, images 54-67). Vascular/Lymphatic: No significant vascular findings are present. No enlarged abdominal or pelvic lymph nodes. Reproductive: Status post hysterectomy. No adnexal masses. Other: Significantly increased size of rim enhancing fluid and air containing abscess within the soft tissues of the anterior abdominal wall. Collection  contains a pigtail drainage catheter located along the more superior aspect of this collection. Collection in total measures approximately 20.8 x 8.0 x 13.0 cm (series 3, image 71; series 7, image 81), previously measured 7.6 x 4.1 x 5.8 cm. Additional abscess along the dome of the bladder measures approximately 8.1 x 4.2 x 6.4 cm (series 7, image 83; series 3, image 62) previously measured 7.5 x 3.4 x 3.7 cm. Pigtail catheter is present within the largest pocket. There are additional smaller abscess pockets insinuated around the sigmoid colon (for example series 3, image 55 and series 6, image 66). Musculoskeletal: No acute or significant osseous findings. IMPRESSION: 1. Significantly increased size of large abscess within the soft tissues of the lower anterior abdominal wall now measuring up to approximately 21 cm, previously 8 cm. Collection contains a pigtail drainage catheter located along the more superior aspect of this collection. 2. Additional catheter containing abscess along the dome of the bladder measures up to 8.1 cm, previously measured 7.5 cm. 3. Changes of chronic diverticulitis within the sigmoid colon with numerous fistulous connections between the sigmoid colon and abscess cavity along the dome of the urinary bladder. 4. Mild bilateral hydronephrosis. Electronically Signed   By: Duanne Guess D.O.   On: 11/24/2019 13:21    Anti-infectives: Anti-infectives (From admission, onward)   Start     Dose/Rate Route Frequency Ordered Stop   11/24/19 2000  vancomycin (VANCOCIN) IVPB 1000 mg/200 mL premix        1,000 mg 200 mL/hr over 60 Minutes Intravenous Every 8 hours 11/24/19 1111     11/24/19 1400  piperacillin-tazobactam (ZOSYN) IVPB 3.375 g        3.375 g 12.5 mL/hr over 240 Minutes Intravenous Every 8 hours 11/24/19 1355     11/24/19 1015  ceFEPIme (MAXIPIME) 2 g in sodium chloride 0.9 % 100 mL IVPB        2 g 200 mL/hr over 30 Minutes Intravenous  Once 11/24/19 1003 11/24/19 1130    11/24/19 1015  metroNIDAZOLE (FLAGYL) IVPB 500 mg        500 mg 100 mL/hr over 60 Minutes Intravenous  Once 11/24/19 1003 11/24/19 1400   11/24/19 0945  vancomycin (VANCOCIN) IVPB 1000 mg/200 mL premix  Status:  Discontinued        1,000 mg 200 mL/hr over 60 Minutes Intravenous  Once 11/24/19 0931 11/24/19 0939   11/24/19 0945  vancomycin (VANCOREADY) IVPB 1500 mg/300 mL        1,500 mg 150 mL/hr over 120 Minutes Intravenous  Once 11/24/19 0939 11/24/19 1418       Assessment/Plan HTN - home med Anemia - Hgb 8.5 from 10.5, monitor. Discussed risks of bleeding with surgery and if hgb drops below 7 we would recommend a blood transfusion. Patient states that due to religious beliefs she would still not want any blood products and understands the risks Does not accept blood products Malnutrition - prealbumin <5, will start TPN DNR  Diverticulitis with multiple abscess - s/p IR perc drain x 2 11/04/19 - CT 8/23 shows significantly increased size of large abscess within the soft tissues of the lower anterior abdominal wall now measuring up to approximately 21 cm, previously 8  cm - collection contains a pigtail drainage catheter located along the more superior aspect of this collection; additional catheter containing abscess along the dome of the bladder measures up to 8.1 cm, previously measured 7.5 cm; changes of chronic diverticulitis within the sigmoid colon with numerous fistulous connections between the sigmoid colon and abscess cavity along the dome of the urinary bladder; mild bilateral hydronephrosis   ID - maxipime/flagyl x1. vancomycin 8/23>>, Zosyn 8/23>>. Bcx, Ucx pending VTE - SCDs, hold lovenox today FEN - IVF, NPO Foley - none Follow up - TBD  Plan - OR today for exploratory laparotomy, partial colectomy, colostomy, incision and drainage abdominal wall abscess. Give 500cc fluid bolus for low BP. Continue IV antibiotics. Will order TPN. If central line not placed for surgery  then the patient will need PICC.   LOS: 1 day    Franne Forts, [facility] 11/25/2019, 8:55 AM Please see Amion for pager number during day hours 7:00am-4:30pm

## 2019-11-25 NOTE — Progress Notes (Signed)
IV nurse came in and spoke with the pt and brother about PICC line insertion. Pt said she was too tired tonight and wants it to be done in the morning.  Sign on door for phlebotomy to use only pediatric tubes for blood draws.

## 2019-11-25 NOTE — Progress Notes (Signed)
PHARMACY - TOTAL PARENTERAL NUTRITION CONSULT NOTE   Indication: Intolerance to enteral feeding  Patient Measurements: Height: 5' 6.5" (168.9 cm) Weight: 90 kg (198 lb 6.6 oz) IBW/kg (Calculated) : 60.45 TPN AdjBW (KG): 67.8 Body mass index is 31.55 kg/m.  Assessment: 83 YOF with recurrent diverticulitis who presented with worsening abdominal pain. She was recently admitted on 8/1 through 8/9 with complicated diverticulitis and multiple abscesses and underwent percutaneous drain x2 placement by IR on 8/3. CT abd on 8/23 showed numerous fistulous connections between the sigmoid colon and abscess cavity. Planning to take to OR today for ex lap, partial colectomy, colostomy and I&D of abdominal wall abscess. Pharmacy consulted to start TPN due to poor enteral nutrition and pre-albumin < 5.   Glucose / Insulin: A1c 5.5, AM glucose 108.  Electrolytes: K 3.1, other electrolytes wnl  Renal: SCr wnl LFTs / TGs: AST low, ALT wnl, Tbili wnl  Prealbumin / albumin: Prealbumin <5, Albumin 1.5  Intake / Output; MIVF: Drain output 40 mL since yesterday, UOP 920 mL + 2x unmeasured output  MIVF: NS @ 125 ml/hr  GI Imaging: 8/23: CT abdomen showed numerous fistulous connections between the sigmoid colon and abscess cavity Surgeries / Procedures:  8/24: Planning Ex lap   Central access: to be placed today >>  TPN start date: 8/24 >>   Nutritional Goals (pending RD recommendations): kCal: 1700-2000 kCal, Protein: 80-100g, Fluid: 1.9L Goal TPN rate is 80 mL/hr (provides 90 g of protein and 1948 kcals per day)  Current Nutrition:  NPO  Plan:  Start TPN at 40 mL/hr at 1800. TPN to provide 45 gm of protein and 975 kCal which meets ~57% of caloric goals  Electrolytes in TPN: 35mEq/L of Na, 39mEq/L of K, 71mEq/L of Ca, 25mEq/L of Mg, and 65mmol/L of Phos. Cl:Ac ratio 1:1 Add standard MVI and trace elements to TPN Initiate Sensitive q6h SSI and adjust as needed  Reduce MIVF to 85 mL/hr at 1800 Monitor  TPN labs on Mon/Thurs F/u dietician recommendations   Vinnie Level, PharmD., BCPS, BCCCP Clinical Pharmacist Clinical phone for 11/25/19 until 3:30pm: [phone] If after 3:30pm, please refer to [facility] for unit-specific pharmacist

## 2019-11-25 NOTE — Progress Notes (Signed)
Pt received back from PACU. PCA  For pain control, instructed pt on its use. IS at bedside will instruct pt. How to use.  NGT to low intermittent suction.  Foley to straight drainage.  Brother at bedside. Pt is alert and oriented.

## 2019-11-25 NOTE — Progress Notes (Signed)
Arrived to gain consent for bedside PICC placement. Spoke with patient and brother, who is the person to consent. Discussed benefits, alternatives, and risks. Patient and brother requested to wait until 11-26-19 AM. Primary RN notified.

## 2019-11-25 NOTE — Transfer of Care (Signed)
Immediate Anesthesia Transfer of Care Note  Patient: [name]  Procedure(s) Performed: EXPLORATORY LAPAROTOMY (N/A Abdomen) SIGMOID COLECTOMY (N/A Abdomen) CREATION OF COLOSTOMY (N/A Abdomen) INCISION AND DRAINAGE ABDOMINAL ABSCESS (N/A Abdomen) SMALL BOWEL RESECTION (N/A Abdomen)  Patient Location: PACU  Anesthesia Type:General  Level of Consciousness: awake and drowsy  Airway & Oxygen Therapy: Patient Spontanous Breathing and Patient connected to nasal cannula oxygen  Post-op Assessment: Report given to RN and Post -op Vital signs reviewed and stable  Post vital signs: Reviewed and stable  Last Vitals:  Vitals Value Taken Time  BP    Temp    Pulse    Resp    SpO2      Last Pain:  Vitals:   11/25/19 1046  TempSrc: Oral  PainSc:          Complications: No complications documented.

## 2019-11-25 NOTE — Care Management (Signed)
[facility] will accept for home health RN at discharge.  Ronny Flurry RN

## 2019-11-25 NOTE — Anesthesia Postprocedure Evaluation (Signed)
Anesthesia Post Note  Patient: [name]  Procedure(s) Performed: EXPLORATORY LAPAROTOMY (N/A Abdomen) SIGMOID COLECTOMY (N/A Abdomen) CREATION OF COLOSTOMY (N/A Abdomen) INCISION AND DRAINAGE ABDOMINAL ABSCESS (N/A Abdomen) SMALL BOWEL RESECTION (N/A Abdomen)     Patient location during evaluation: PACU Anesthesia Type: General Level of consciousness: awake and alert, patient cooperative and oriented Pain management: pain level controlled Vital Signs Assessment: post-procedure vital signs reviewed and stable Respiratory status: spontaneous breathing, nonlabored ventilation and respiratory function stable Cardiovascular status: blood pressure returned to baseline and stable Postop Assessment: no apparent nausea or vomiting Anesthetic complications: no   No complications documented.  Last Vitals:  Vitals:   11/25/19 1520 11/25/19 1535  BP: 108/74 103/63  Pulse: (!) 124 (!) 122  Resp: (!) 23 18  Temp: 37.1 C   SpO2: 100% 98%    Last Pain:  Vitals:   11/25/19 1600  TempSrc:   PainSc: 8                  Ison Wichmann,E. Dayden Viverette

## 2019-11-25 NOTE — Anesthesia Preprocedure Evaluation (Addendum)
Anesthesia Evaluation  Patient identified by MRN, date of birth, ID band Patient awake    Reviewed: Allergy & Precautions, H&P , NPO status , Patient's Chart, lab work & pertinent test results, reviewed documented beta blocker date and time   Airway Mallampati: II  TM Distance: >3 FB Neck ROM: full    Dental no notable dental hx. (+) Teeth Intact, Dental Advisory Given   Pulmonary neg pulmonary ROS,    Pulmonary exam normal breath sounds clear to auscultation       Cardiovascular Exercise Tolerance: Good hypertension, Pt. on medications and Pt. on home beta blockers  Rhythm:regular Rate:Normal     Neuro/Psych negative neurological ROS  negative psych ROS   GI/Hepatic negative GI ROS, Neg liver ROS,   Endo/Other  negative endocrine ROS  Renal/GU negative Renal ROS  negative genitourinary   Musculoskeletal   Abdominal   Peds  Hematology  (+) anemia ,   Anesthesia Other Findings   Reproductive/Obstetrics negative OB ROS                           Anesthesia Physical Anesthesia Plan  ASA: IV and emergent  Anesthesia Plan: General   Post-op Pain Management:    Induction:   PONV Risk Score and Plan: 3 and Dexamethasone, Ondansetron and Treatment may vary due to age or medical condition  Airway Management Planned: Oral ETT  Additional Equipment:   Intra-op Plan:   Post-operative Plan: Extubation in OR and Possible Post-op intubation/ventilation  Informed Consent: I have reviewed the patients History and Physical, chart, labs and discussed the procedure including the risks, benefits and alternatives for the proposed anesthesia with the patient or authorized representative who has indicated his/her understanding and acceptance.     Dental Advisory Given  Plan Discussed with: CRNA, Anesthesiologist and Surgeon  Anesthesia Plan Comments: (I had a long discussion with [name]  prior to going back to the OR.  I explained that it would be possible that she could need some blood during the procedure or afterwards.  She clearly understood the risks and said that under no circumstances would she take blood even if it meant her demise. She prior agreed to take albumin. )      Anesthesia Quick Evaluation

## 2019-11-25 NOTE — Progress Notes (Signed)
Sent order to IV team to see about PICC line insertion for today if that is possible for the initiation of TPN.

## 2019-11-25 NOTE — Progress Notes (Signed)
Pt refusing Type and Screen, blood products with exception of albumin. Refusal form signed and in shadow chart.

## 2019-11-26 ENCOUNTER — Encounter (HOSPITAL_COMMUNITY): Payer: Self-pay | Admitting: General Surgery

## 2019-11-26 LAB — CBC
HCT: 21.3 % — ABNORMAL LOW (ref 36.0–46.0)
Hemoglobin: 7.5 g/dL — ABNORMAL LOW (ref 12.0–15.0)
MCH: 24.4 pg — ABNORMAL LOW (ref 26.0–34.0)
MCHC: 35.2 g/dL (ref 30.0–36.0)
MCV: 69.4 fL — ABNORMAL LOW (ref 80.0–100.0)
Platelets: 377 10*3/uL (ref 150–400)
RBC: 3.07 MIL/uL — ABNORMAL LOW (ref 3.87–5.11)
RDW: 18.6 % — ABNORMAL HIGH (ref 11.5–15.5)
WBC: 40.3 10*3/uL — ABNORMAL HIGH (ref 4.0–10.5)
nRBC: 0 % (ref 0.0–0.2)

## 2019-11-26 LAB — GLUCOSE, CAPILLARY
Glucose-Capillary: 144 mg/dL — ABNORMAL HIGH (ref 70–99)
Glucose-Capillary: 153 mg/dL — ABNORMAL HIGH (ref 70–99)
Glucose-Capillary: 158 mg/dL — ABNORMAL HIGH (ref 70–99)
Glucose-Capillary: 160 mg/dL — ABNORMAL HIGH (ref 70–99)
Glucose-Capillary: 171 mg/dL — ABNORMAL HIGH (ref 70–99)

## 2019-11-26 LAB — DIFFERENTIAL
Abs Immature Granulocytes: 3.78 10*3/uL — ABNORMAL HIGH (ref 0.00–0.07)
Basophils Absolute: 0 10*3/uL (ref 0.0–0.1)
Basophils Relative: 0 %
Eosinophils Absolute: 0 10*3/uL (ref 0.0–0.5)
Eosinophils Relative: 0 %
Immature Granulocytes: 9 %
Lymphocytes Relative: 8 %
Lymphs Abs: 3.1 10*3/uL (ref 0.7–4.0)
Monocytes Absolute: 1.6 10*3/uL — ABNORMAL HIGH (ref 0.1–1.0)
Monocytes Relative: 4 %
Neutro Abs: 31.9 10*3/uL — ABNORMAL HIGH (ref 1.7–7.7)
Neutrophils Relative %: 79 %

## 2019-11-26 LAB — TRIGLYCERIDES: Triglycerides: 94 mg/dL (ref <150)

## 2019-11-26 LAB — PHOSPHORUS: Phosphorus: 3.7 mg/dL (ref 2.5–4.6)

## 2019-11-26 LAB — COMPREHENSIVE METABOLIC PANEL
ALT: 8 U/L (ref 0–44)
AST: 14 U/L — ABNORMAL LOW (ref 15–41)
Albumin: 1.8 g/dL — ABNORMAL LOW (ref 3.5–5.0)
Alkaline Phosphatase: 52 U/L (ref 38–126)
Anion gap: 8 (ref 5–15)
BUN: 12 mg/dL (ref 6–20)
CO2: 25 mmol/L (ref 22–32)
Calcium: 7.9 mg/dL — ABNORMAL LOW (ref 8.9–10.3)
Chloride: 105 mmol/L (ref 98–111)
Creatinine, Ser: 0.74 mg/dL (ref 0.44–1.00)
GFR calc Af Amer: >60 mL/min (ref >60)
GFR calc non Af Amer: >60 mL/min (ref >60)
Glucose, Bld: 187 mg/dL — ABNORMAL HIGH (ref 70–99)
Potassium: 4.4 mmol/L (ref 3.5–5.1)
Sodium: 138 mmol/L (ref 135–145)
Total Bilirubin: 0.6 mg/dL (ref 0.3–1.2)
Total Protein: 5.4 g/dL — ABNORMAL LOW (ref 6.5–8.1)

## 2019-11-26 LAB — SURGICAL PATHOLOGY

## 2019-11-26 LAB — MAGNESIUM: Magnesium: 2 mg/dL (ref 1.7–2.4)

## 2019-11-26 MED ORDER — TRACE MINERALS CU-MN-SE-ZN 300-55-60-3000 MCG/ML IV SOLN
INTRAVENOUS | Status: AC
  Filled 2019-11-26: qty 384

## 2019-11-26 MED ORDER — SODIUM CHLORIDE 0.9% FLUSH
10.0000 mL | INTRAVENOUS | Status: DC | PRN
  Administered 2019-11-29 – 2019-12-11 (×3): 10 mL

## 2019-11-26 NOTE — Progress Notes (Signed)
PHARMACY - TOTAL PARENTERAL NUTRITION CONSULT NOTE   Indication: Intolerance to enteral feeding  Patient Measurements: Height: 5' 6.5" (168.9 cm) Weight: 90 kg (198 lb 6.6 oz) IBW/kg (Calculated) : 60.44 TPN AdjBW (KG): 67.8 Body mass index is 31.55 kg/m.  Assessment: 5 YOF with recurrent diverticulitis who presented with worsening abdominal pain. She was recently admitted on 8/1 through 8/9 with complicated diverticulitis and multiple abscesses and underwent percutaneous drain x2 placement by IR on 8/3. CT abd on 8/23 showed numerous fistulous connections between the sigmoid colon and abscess cavity. Planning to take to OR today for ex lap, partial colectomy, colostomy and I&D of abdominal wall abscess. Pharmacy consulted to start TPN due to poor enteral nutrition and pre-albumin < 5.   Of note, TPN was not started yesterday due to lack of central IV access   Glucose / Insulin: A1c 5.5, CBGs 187-237 Electrolytes: K up to 4.4 after repletion, other electrolytes wnl  Renal: SCr wnl LFTs / TGs: AST low, ALT wnl, Tbili wnl, TG 94  Prealbumin / albumin: Prealbumin <5, Albumin 1.8 Intake / Output; MIVF: Drain output 40 mL since yesterday, UOP 920 mL + 2x unmeasured output  MIVF: None GI Imaging: 8/23: CT abdomen showed numerous fistulous connections between the sigmoid colon and abscess cavity Surgeries / Procedures:  8/24: Open sigmoid colectomy and end colostomy, SBR, I&D of abdominal wall abscess.   Central access: PICC to be placed today >>  TPN start date: 8/25 >>   Nutritional Goals (per RD recommendations on 8/24): kCal: 2100-2300 kCal, Protein: 125-135g, Fluid: >2.1L Goal TPN rate is 80 mL/hr (provides 90 g of protein and 1948 kcals per day)  Current Nutrition:  NPO   Plan:  Start TPN at 40 mL/hr at 1800. TPN to provide 57 gm of protein and 1009 kCal which meets ~48% of caloric goals  Electrolytes in TPN: 50mEq/L of Na, Decrease to 98mEq/L of K, 71mEq/L of Ca, 42mEq/L of Mg,  and 79mmol/L of Phos. Cl:Ac ratio 1:1 Add standard MVI and trace elements to TPN Continue Sensitive q6h SSI and adjust as needed  Monitor TPN labs on Mon/Thurs  Vinnie Level, PharmD., BCPS, BCCCP Clinical Pharmacist Clinical phone for 11/26/19 until 3:30pm: [phone] If after 3:30pm, please refer to [facility] for unit-specific pharmacist

## 2019-11-26 NOTE — Evaluation (Signed)
Occupational Therapy Evaluation Patient Details Name: [name] MRN: [id number] DOB: April 15, 1965 Today's Date: 11/26/2019    History of Present Illness 54yo female PMH HTN and recurrent diverticulitis who returned to the ED today with 2 days of worsening abdominal pain.  Patient was admitted 8/1 through 8/9 with complicated diverticulitis and multiple abscesses. CT scan shows significantly increased size of large abscess within the soft tissues of the lower anterior abdominal wall now measuring up to approximately 21 cm, previously 8 cm. Pt is now s/p Open sigmoid colectomy and end colostomy (Hartmann's procedure), small bowel resection, incision and drainage of abdominal wall abscess with debridement of skin and subcutaneous fatty tissue on 8/24.   Clinical Impression   Pt admitted with the above diagnoses and presents with below problem list. Pt will benefit from continued acute OT to address the below listed deficits and maximize independence with basic ADLs prior to d/c home. At baseline, pt was independent with ADLs. Pt is currently min-mod A with LB ADLs and functional transfers. 1x posterior LOB on turn needing mod A to correct balance otherwise close min guard with mobility in the room utilizing a rw.     Follow Up Recommendations  Home health OT;Supervision - Intermittent;Other (comment) (initial 24 hr if possible)    Equipment Recommendations  3 in 1 bedside commode    Recommendations for Other Services       Precautions / Restrictions Precautions Precautions: Fall Restrictions Weight Bearing Restrictions: No      Mobility Bed Mobility Overal bed mobility: Needs Assistance Bed Mobility: Supine to Sit;Sidelying to Sit;Rolling Rolling: Min guard Sidelying to sit: Min guard;Mod assist Supine to sit: Mod assist     General bed mobility comments: Attempted logroll, ultimately completed as more supie>EOB. Assist during powerup of trunk. Utilized bed pad for comfort  during assist.   Transfers Overall transfer level: Needs assistance Equipment used: Rolling walker (2 wheeled) Transfers: Sit to/from Stand Sit to Stand: Min assist;Mod assist         General transfer comment: min A from elevated seat height, mod A from regular seat height. Assist to stabilize rw and steady pt. mod A to powerup from recliner.     Balance Overall balance assessment: Needs assistance Sitting-balance support: Bilateral upper extremity supported;Feet supported Sitting balance-Leahy Scale: Fair     Standing balance support: Bilateral upper extremity supported Standing balance-Leahy Scale: Poor Standing balance comment: seeks external support in standing                           ADL either performed or assessed with clinical judgement   ADL Overall ADL's : Needs assistance/impaired Eating/Feeding: Set up;Sitting   Grooming: Set up;Sitting   Upper Body Bathing: Set up;Sitting   Lower Body Bathing: Moderate assistance;Sit to/from stand   Upper Body Dressing : Set up;Sitting   Lower Body Dressing: Moderate assistance;Sit to/from stand   Toilet Transfer: Moderate assistance;Ambulation;Min guard;RW;BSC   Toileting- Clothing Manipulation and Hygiene: Moderate assistance;Sit to/from stand       Functional mobility during ADLs: Min guard;Moderate assistance;Rolling walker General ADL Comments: Pt completed bed mobility, in room functional mobility and simulated toilet transfers (up to recliner). Pt mostly close min guard walking but did have 1x LOB on turn needing mod A to prevent a fall. Pt reports this happens at home sometimes as well     Vision         Perception     Praxis  Pertinent Vitals/Pain Pain Assessment: Faces Faces Pain Scale: Hurts little more Pain Location: abdomen Pain Descriptors / Indicators: Grimacing;Guarding;Sore Pain Intervention(s): Limited activity within patient's tolerance;Monitored during  session;Repositioned;PCA encouraged     Hand Dominance     Extremity/Trunk Assessment Upper Extremity Assessment Upper Extremity Assessment: Overall WFL for tasks assessed   Lower Extremity Assessment Lower Extremity Assessment: Defer to PT evaluation       Communication Communication Communication: No difficulties   Cognition Arousal/Alertness: Awake/alert Behavior During Therapy: WFL for tasks assessed/performed Overall Cognitive Status: Within Functional Limits for tasks assessed                                     General Comments  HR up to 135 sitting during rest breaks    Exercises     Shoulder Instructions      Home Living Family/patient expects to be discharged to:: Private residence Living Arrangements: Other relatives;Other (Comment) (sister) Available Help at Discharge: Family;Available PRN/intermittently (mom) Type of Home: Apartment Home Access: Stairs to enter;Other (comment) (17 stairs, second floor) Entrance Stairs-Number of Steps: 17 Entrance Stairs-Rails: Left;Right Home Layout: One level     Bathroom Shower/Tub: Tub/shower unit         Home Equipment: Grab bars - tub/shower          Prior Functioning/Environment Level of Independence: Independent                 OT Problem List: Decreased activity tolerance;Impaired balance (sitting and/or standing);Decreased knowledge of use of DME or AE;Decreased knowledge of precautions;Pain      OT Treatment/Interventions: Self-care/ADL training;DME and/or AE instruction;Therapeutic activities;Patient/family education;Balance training    OT Goals(Current goals can be found in the care plan section) Acute Rehab OT Goals Patient Stated Goal: highly values independence OT Goal Formulation: With patient Time For Goal Achievement: 12/10/19 Potential to Achieve Goals: Good  OT Frequency: Min 2X/week   Barriers to D/C:            Co-evaluation PT/OT/SLP  Co-Evaluation/Treatment: Yes Reason for Co-Treatment: To address functional/ADL transfers;For patient/therapist safety   OT goals addressed during session: ADL's and self-care;Proper use of Adaptive equipment and DME      AM-PAC OT "6 Clicks" Daily Activity     Outcome Measure Help from another person eating meals?: None Help from another person taking care of personal grooming?: A Little Help from another person toileting, which includes using toliet, bedpan, or urinal?: A Little Help from another person bathing (including washing, rinsing, drying)?: A Little Help from another person to put on and taking off regular upper body clothing?: A Little Help from another person to put on and taking off regular lower body clothing?: A Little 6 Click Score: 19   End of Session Equipment Utilized During Treatment: Rolling walker  Activity Tolerance: Patient tolerated treatment well Patient left: in chair;with call bell/phone within reach;with chair alarm set  OT Visit Diagnosis: Unsteadiness on feet (R26.81);Pain;Muscle weakness (generalized) (M62.81);History of falling (Z91.81)                Time: 0277-4128 OT Time Calculation (min): 26 min Charges:  OT General Charges $OT Visit: 1 Visit OT Evaluation $OT Eval Moderate Complexity: 1 Mod  Raynald Kemp, OT Acute Rehabilitation Services Pager: [phone] Office: [phone]   Pilar Grammes 11/26/2019, 1:33 PM

## 2019-11-26 NOTE — Consult Note (Signed)
WOC Nurse ostomy consult note Stoma type/location: LUQ, end colostomy Stomal assessment/size: oval shaped, pink, moist, visualized through pouch. Despite marking appears to have some dipping of the abdominal topography Peristomal assessment: NA Treatment options for stomal/peristomal skin: will add 2" barrier ring with pouch change tomorrow Output bloody Ostomy pouching: 2pc. 2 3/4" in place  Education provided: patient with NG, no output, slightly sedated from PCA use Enrolled patient in [employer] DC program: No, form signed will enroll once pouching determined   WOC at the bedside while CCS PA repacked open abdominal wound, topper dressing placed by [facility] nurse and secured with tape.   WOC Nurse will follow along with you for continued support with ostomy teaching and care Zakya Halabi [facility], RN, [place], CNS, [place] [phone]

## 2019-11-26 NOTE — Progress Notes (Signed)
OT Cancellation Note  Patient Details Name: [name] MRN: [id number] DOB: [birth year]   Cancelled Treatment:    Reason Eval/Treat Not Completed: Patient at procedure or test/ unavailable. PICC line being placed. Plan to reattempt.   Pilar Grammes 11/26/2019, 9:08 AM

## 2019-11-26 NOTE — Progress Notes (Signed)
Peripherally Inserted Central Catheter Placement  The IV Nurse has discussed with the patient and/or persons authorized to consent for the patient, the purpose of this procedure and the potential benefits and risks involved with this procedure.  The benefits include less needle sticks, lab draws from the catheter, and the patient may be discharged home with the catheter. Risks include, but not limited to, infection, bleeding, blood clot (thrombus formation), and puncture of an artery; nerve damage and irregular heartbeat and possibility to perform a PICC exchange if needed/ordered by physician.  Alternatives to this procedure were also discussed.  Bard Power PICC patient education guide, fact sheet on infection prevention and patient information card has been provided to patient /or left at bedside.    PICC Placement Documentation  PICC Double Lumen 11/26/19 PICC Right Basilic 41 cm 0 cm (Active)  Indication for Insertion or Continuance of Line Administration of hyperosmolar/irritating solutions (i.e. TPN, Vancomycin, etc.) 11/26/19 0932  Exposed Catheter (cm) 0 cm 11/26/19 0932  Site Assessment Clean;Dry;Intact 11/26/19 0932  Lumen #1 Status Flushed;Blood return noted 11/26/19 0932  Lumen #2 Status Flushed;Blood return noted 11/26/19 0932  Dressing Type Transparent 11/26/19 0932  Dressing Status Clean;Dry;Intact;Antimicrobial disc in place;Other (Comment) 11/26/19 0932  Dressing Intervention New dressing 11/26/19 0932  Dressing Change Due 12/03/19 11/26/19 0932       Reginia Forts [place] 11/26/2019, 9:33 AM

## 2019-11-26 NOTE — Progress Notes (Signed)
Pt is yellow MEWS due to pulse rate this am. Will continue to monitor pt.

## 2019-11-26 NOTE — Progress Notes (Signed)
1 Day Post-Op  Subjective: CC: Patient reports sorenss around her midline wound. PCA is controlling her pain. She has not gotten out of bed yet since surgery. No nausea. No output from colostomy. Brother is in the room. She states she is tired and would just like to rest. We discuss goal for getting oob and working with therapies.   Objective: Vital signs in last 24 hours: Temp:  [97.4 F (36.3 C)-98.8 F (37.1 C)] 97.4 F (36.3 C) (08/25 0440) Pulse Rate:  [105-124] 105 (08/25 0440) Resp:  [14-26] 18 (08/25 0800) BP: (99-108)/(63-82) 105/65 (08/25 0440) SpO2:  [98 %-100 %] 100 % (08/25 0800) Weight:  [90 kg] 90 kg (08/24 1106) Last BM Date: 11/21/19  Intake/Output from previous day: 08/24 0701 - 08/25 0700 In: 2700 [I.V.:1400; IV Piggyback:1300] Out: 2200 [Urine:750; Blood:500] Intake/Output this shift: No intake/output data recorded.  PE: Gen:  Alert, NAD, pleasant HEENT: EOM's intact, pupils equal and round Card:  RRR Pulm:  CTAB, no W/R/R, effort normal. On 1L o2 Abd: Soft, mild distension, there is tenderness of the LLQ and over the midline wound that appears appropriate. No peritonitis. Hypoactive BS. NGT in place. Colostomy in place. Stoma budded and red. There is some sweat in the colostomy bag. No air or stool in the bag. Midline wound as noted in the pictures below.  Ext:  No LE edema  Psych: A&Ox3  Skin: Wound as noted above. Otherwise no rashes noted, warm and dry         Lab Results:  Recent Labs    11/25/19 1929 11/26/19 0423  WBC 42.3* 40.3*  HGB 8.2* 7.5*  HCT 23.7* 21.3*  PLT 420* 377   BMET Recent Labs    11/25/19 1929 11/26/19 0423  NA 140 138  K 3.5 4.4  CL 106 105  CO2 20* 25  GLUCOSE 237* 187*  BUN 12 12  CREATININE 0.96 0.74  CALCIUM 7.8* 7.9*   PT/INR Recent Labs    11/24/19 0929  LABPROT 16.6*  INR 1.4*   CMP     Component Value Date/Time   NA 138 11/26/2019 0423   K 4.4 11/26/2019 0423   CL 105 11/26/2019  0423   CO2 25 11/26/2019 0423   GLUCOSE 187 (H) 11/26/2019 0423   BUN 12 11/26/2019 0423   CREATININE 0.74 11/26/2019 0423   CALCIUM 7.9 (L) 11/26/2019 0423   PROT 5.4 (L) 11/26/2019 0423   ALBUMIN 1.8 (L) 11/26/2019 0423   AST 14 (L) 11/26/2019 0423   ALT 8 11/26/2019 0423   ALKPHOS 52 11/26/2019 0423   BILITOT 0.6 11/26/2019 0423   GFRNONAA >60 11/26/2019 0423   GFRAA >60 11/26/2019 0423   Lipase  No results found for: LIPASE     Studies/Results: CT ABDOMEN PELVIS W CONTRAST  Result Date: 11/24/2019 CLINICAL DATA:  Lower abdominal pain. Patient reports no output from drainage catheters. EXAM: CT ABDOMEN AND PELVIS WITH CONTRAST TECHNIQUE: Multidetector CT imaging of the abdomen and pelvis was performed using the standard protocol following bolus administration of intravenous contrast. CONTRAST:  OMNIPAQUE IOHEXOL 300 MG/ML  SOLN COMPARISON:  11/14/2019 FINDINGS: Lower chest: No acute abnormality. Hepatobiliary: Stable subcentimeter low-density lesion within the left hepatic lobe, which remains too small to definitively characterize. Liver is otherwise unremarkable. Gallbladder is moderately distended. No hyperdense gallstone. No biliary dilatation. Pancreas: Unremarkable. No pancreatic ductal dilatation or surrounding inflammatory changes. Spleen: Normal in size without focal abnormality. Adrenals/Urinary Tract: Unremarkable adrenal glands. Kidneys enhance  symmetrically. There is mild bilateral hydronephrosis. No renal or ureteral calculi are seen. Bladder is moderately distended with thickening of the wall at the bladder dome. No intraluminal air within the bladder. Stomach/Bowel: Stomach and small bowel are within normal limits. No bowel obstruction. Changes of chronic diverticulitis within the sigmoid colon with numerous fistulous connections between the sigmoid colon and abscess cavity along the dome of the urinary bladder (series 6, images 54-67). Vascular/Lymphatic: No  significant vascular findings are present. No enlarged abdominal or pelvic lymph nodes. Reproductive: Status post hysterectomy. No adnexal masses. Other: Significantly increased size of rim enhancing fluid and air containing abscess within the soft tissues of the anterior abdominal wall. Collection contains a pigtail drainage catheter located along the more superior aspect of this collection. Collection in total measures approximately 20.8 x 8.0 x 13.0 cm (series 3, image 71; series 7, image 81), previously measured 7.6 x 4.1 x 5.8 cm. Additional abscess along the dome of the bladder measures approximately 8.1 x 4.2 x 6.4 cm (series 7, image 83; series 3, image 62) previously measured 7.5 x 3.4 x 3.7 cm. Pigtail catheter is present within the largest pocket. There are additional smaller abscess pockets insinuated around the sigmoid colon (for example series 3, image 55 and series 6, image 66). Musculoskeletal: No acute or significant osseous findings. IMPRESSION: 1. Significantly increased size of large abscess within the soft tissues of the lower anterior abdominal wall now measuring up to approximately 21 cm, previously 8 cm. Collection contains a pigtail drainage catheter located along the more superior aspect of this collection. 2. Additional catheter containing abscess along the dome of the bladder measures up to 8.1 cm, previously measured 7.5 cm. 3. Changes of chronic diverticulitis within the sigmoid colon with numerous fistulous connections between the sigmoid colon and abscess cavity along the dome of the urinary bladder. 4. Mild bilateral hydronephrosis. Electronically Signed   By: Duanne Guess D.O.   On: 11/24/2019 13:21   [place] EKG SITE RITE  Result Date: 11/25/2019 If Site Rite image not attached, placement could not be confirmed due to current cardiac rhythm.   Anti-infectives: Anti-infectives (From admission, onward)   Start     Dose/Rate Route Frequency Ordered Stop   11/25/19 1700   fluconazole (DIFLUCAN) IVPB 400 mg        400 mg 100 mL/hr over 120 Minutes Intravenous Every 24 hours 11/25/19 1649     11/24/19 2000  vancomycin (VANCOCIN) IVPB 1000 mg/200 mL premix  Status:  Discontinued        1,000 mg 200 mL/hr over 60 Minutes Intravenous Every 8 hours 11/24/19 1111 11/25/19 1649   11/24/19 1400  piperacillin-tazobactam (ZOSYN) IVPB 3.375 g        3.375 g 12.5 mL/hr over 240 Minutes Intravenous Every 8 hours 11/24/19 1355     11/24/19 1015  ceFEPIme (MAXIPIME) 2 g in sodium chloride 0.9 % 100 mL IVPB        2 g 200 mL/hr over 30 Minutes Intravenous  Once 11/24/19 1003 11/24/19 1130   11/24/19 1015  metroNIDAZOLE (FLAGYL) IVPB 500 mg        500 mg 100 mL/hr over 60 Minutes Intravenous  Once 11/24/19 1003 11/24/19 1400   11/24/19 0945  vancomycin (VANCOCIN) IVPB 1000 mg/200 mL premix  Status:  Discontinued        1,000 mg 200 mL/hr over 60 Minutes Intravenous  Once 11/24/19 0931 11/24/19 0939   11/24/19 0945  vancomycin (VANCOREADY) IVPB 1500 mg/300  mL        1,500 mg 150 mL/hr over 120 Minutes Intravenous  Once 11/24/19 0939 11/24/19 1418       Assessment/Plan HTN - home med ABL Anemia - Hgb 7.5. Patient states that due to religious beliefs she would still not want any blood products and understands the risks Does not accept blood products Malnutrition - prealbumin <5, will start TPN  Perforated diverticulitis with w/ feculent peritonitis and abdominal wall abscess - s/p open sigmoid colectomy and end colostomy (Hartmann's procedure), small bowel resection, incision and drainage of abdominal wall abscess with debridement of skin and subcutaneous fatty tissueby Dr. Donell Beers 8/24 - POD #1 - Cont abx  - Cont IV Tylenol, PCA - Cont NGT. Await ROBF - WOCN following for new ostomy - Start WTD BID for midline wound - Mobilize. PT/OT - Pulm toilet, IS  ID -maxipime/flagyl x1. vancomycin 8/23. Zosyn 8/23>>. Diflucan 8/24 >> WBC 40.3. Bcx, Ucx pending VTE  -SCDs, hold lovenox and recheck hgb in am. If stable can consider restarting.  FEN -IVF, NPO, NGT, PICC/TPN today Foley - In place. Will discuss with MD about removing today vs tomorrow.  Follow up -Dr. Donell Beers    LOS: 2 days    [name] , [facility] Surgery 11/26/2019, 8:29 AM Please see Amion for pager number during day hours 7:00am-4:30pm

## 2019-11-26 NOTE — Evaluation (Signed)
Physical Therapy Evaluation Patient Details Name: [name] MRN: [id number] DOB: 1965/06/20 Today's Date: 11/26/2019   History of Present Illness  54yo female PMH HTN and recurrent diverticulitis who returned to the ED today with 2 days of worsening abdominal pain.  Patient was admitted 8/1 through 8/9 with complicated diverticulitis and multiple abscesses. CT scan shows significantly increased size of large abscess within the soft tissues of the lower anterior abdominal wall now measuring up to approximately 21 cm, previously 8 cm. Pt is now s/p Open sigmoid colectomy and end colostomy (Hartmann's procedure), small bowel resection, incision and drainage of abdominal wall abscess with debridement of skin and subcutaneous fatty tissue on 8/24.  Clinical Impression   Pt presents with post-operative abdominal pain, difficulty performing mobility with abdominal precautions, impaired standing balance, and decreased activity tolerance vs baseline. Pt to benefit from acute PT to address deficits. Pt ambulated short room distance with assist of RW and steadying from PT and OT, pt with x1 posterior loss of balance during gait requiring intervention to correct. Pt with tachycardia up to 140s with mobility, but pt asymptomatic. PT anticipates pt will progress well with mobility, recommending home with HHPT and increased family assist if possible. PT to progress mobility as tolerated, and will continue to follow acutely.      Follow Up Recommendations Home health PT;Supervision/Assistance - 24 hour    Equipment Recommendations  None recommended by PT;Other (comment) (TBD)    Recommendations for Other Services       Precautions / Restrictions Precautions Precautions: Fall Restrictions Weight Bearing Restrictions: No      Mobility  Bed Mobility Overal bed mobility: Needs Assistance Bed Mobility: Supine to Sit;Sidelying to Sit;Rolling Rolling: Min guard Sidelying to sit: Mod assist;HOB  elevated Supine to sit: Mod assist     General bed mobility comments: Attempted logroll, ultimately completed as more supine>sit. Assist during powerup of trunk, pt able to move LEs to EOB without PT assist. Utilized bed pad for comfort during assist.  Transfers Overall transfer level: Needs assistance Equipment used: Rolling walker (2 wheeled) Transfers: Sit to/from Stand Sit to Stand: Min assist;Mod assist         General transfer comment: min A from elevated seat height, mod A from regular seat height with assist for power up, steadying, hand placement when rising/sitting. Assist to stabilize rw and steady pt. mod A to powerup from recliner.  Ambulation/Gait Ambulation/Gait assistance: Min assist;Mod assist;+2 safety/equipment Gait Distance (Feet): 15 Feet Assistive device: Rolling walker (2 wheeled) Gait Pattern/deviations: Step-through pattern;Decreased stride length;Trunk flexed Gait velocity: decr   General Gait Details: min assist to steady, lines/leads. Mod assist for once instance of posterior LOB, occurred when pt turning in preparation to sit in recliner. Verbal cuing for RW positioning, room navigation.  Stairs            Wheelchair Mobility    Modified Rankin (Stroke Patients Only)       Balance Overall balance assessment: Needs assistance Sitting-balance support: Bilateral upper extremity supported;Feet supported Sitting balance-Leahy Scale: Fair     Standing balance support: Bilateral upper extremity supported Standing balance-Leahy Scale: Fair Standing balance comment: able to stand without external support statically, reliant on external assist during dynamic standing tasks                             Pertinent Vitals/Pain Pain Assessment: Faces Faces Pain Scale: Hurts little more Pain Location: abdomen Pain Descriptors /  Indicators: Grimacing;Guarding;Sore Pain Intervention(s): Limited activity within patient's tolerance;Monitored  during session;Repositioned    Home Living Family/patient expects to be discharged to:: Private residence Living Arrangements: Other relatives;Other (Comment) (sister) Available Help at Discharge: Family;Available PRN/intermittently (mom) Type of Home: Apartment Home Access: Stairs to enter;Other (comment) (17 stairs, second floor) Entrance Stairs-Rails: Left;Right Entrance Stairs-Number of Steps: 17 Home Layout: One level Home Equipment: Grab bars - tub/shower      Prior Function Level of Independence: Independent               Hand Dominance   Dominant Hand: Right    Extremity/Trunk Assessment   Upper Extremity Assessment Upper Extremity Assessment: Defer to OT evaluation    Lower Extremity Assessment Lower Extremity Assessment: Overall WFL for tasks assessed    Cervical / Trunk Assessment Cervical / Trunk Assessment: Other exceptions Cervical / Trunk Exceptions: forward flexed trunk in standing  Communication   Communication: No difficulties  Cognition Arousal/Alertness: Awake/alert Behavior During Therapy: WFL for tasks assessed/performed Overall Cognitive Status: Within Functional Limits for tasks assessed                                        General Comments General comments (skin integrity, edema, etc.): HRmax 143 bpm during session    Exercises     Assessment/Plan    PT Assessment Patient needs continued PT services  PT Problem List Decreased mobility;Decreased safety awareness;Decreased balance;Decreased activity tolerance;Decreased knowledge of use of DME;Pain;Cardiopulmonary status limiting activity       PT Treatment Interventions DME instruction;Therapeutic activities;Gait training;Therapeutic exercise;Patient/family education;Balance training;Stair training;Neuromuscular re-education;Functional mobility training    PT Goals (Current goals can be found in the Care Plan section)  Acute Rehab PT Goals Patient Stated Goal:  home, to return to independence PT Goal Formulation: With patient Time For Goal Achievement: 12/10/19 Potential to Achieve Goals: Good    Frequency Min 3X/week   Barriers to discharge        Co-evaluation PT/OT/SLP Co-Evaluation/Treatment: Yes Reason for Co-Treatment: For patient/therapist safety;To address functional/ADL transfers   OT goals addressed during session: ADL's and self-care;Proper use of Adaptive equipment and DME       AM-PAC PT "6 Clicks" Mobility  Outcome Measure Help needed turning from your back to your side while in a flat bed without using bedrails?: A Little Help needed moving from lying on your back to sitting on the side of a flat bed without using bedrails?: A Lot Help needed moving to and from a bed to a chair (including a wheelchair)?: A Lot Help needed standing up from a chair using your arms (e.g., wheelchair or bedside chair)?: A Little Help needed to walk in hospital room?: A Little Help needed climbing 3-5 steps with a railing? : A Lot 6 Click Score: 15    End of Session   Activity Tolerance: Patient tolerated treatment well;Patient limited by fatigue Patient left: in chair;with call bell/phone within reach;with chair alarm set Nurse Communication: Mobility status PT Visit Diagnosis: Other abnormalities of gait and mobility (R26.89);Muscle weakness (generalized) (M62.81)    Time: 4098-1191 PT Time Calculation (min) (ACUTE ONLY): 26 min   Charges:   PT Evaluation $PT Eval Low Complexity: 1 Low         Kendrick Remigio E, PT Acute Rehabilitation Services Pager [phone]  Office [phone]   Hiroshi Krummel D Despina Hidden 11/26/2019, 2:51 PM

## 2019-11-26 NOTE — Progress Notes (Signed)
Nutrition Follow-up  DOCUMENTATION CODES:   Severe malnutrition in context of acute illness/injury  INTERVENTION:   -TPN management per pharmacy -RD will follow for diet advancement and add supplements as appropriate  NUTRITION DIAGNOSIS:   Severe Malnutrition related to acute illness (perforated diverticulitis with intra-abdominal abscess) as evidenced by mild fat depletion, moderate fat depletion, mild muscle depletion, moderate muscle depletion, percent weight loss.  Ongoing  GOAL:   Patient will meet greater than or equal to 90% of their needs  Progressing  MONITOR:   Diet advancement, Labs, Weight trends, Skin, I & O's  REASON FOR ASSESSMENT:   Consult New TPN/TNA  ASSESSMENT:   [name] is a 54yo female PMH HTN and recurrent diverticulitis who returned to the ED today with 2 days of worsening abdominal pain.  8/24- s/p PROCEDURE PERFORMED:Open sigmoid colectomy and end colostomy (Hartmann's procedure), small bowel resection, incision and drainage of abdominal wall abscess with debridement of skin and subcutaneous fatty tissue 8/25- s/p PICC, TPN initiated  Reviewed I/O's: +500 ml x 24 hours and +2.1 L since admission  UOP: 750 ml x 24 hours  Spoke with pt and brother at bedside. Pt reports a general decline in health over the past month. She shares her appetite comes and goes- she would try to eat about 6 small meals per day "on a good day", but this was often difficulty for her. She also reports that she was on a "liquid diet for 15 days" PTA and this really limited her desire to eat. She does not feel hungry or eager for food at this time.   Pt endorses weight loss; she is unsure of her UBW or how much weight she has lost, but shares "no one recognizes me when they see my picture in the medical record". Reviewed wt hx; pt has experienced a 6.8% wt loss over the past 3 weeks, which is significant for time frame.  Per pharmacy note, plan to initiate TPN at  40 ml/hr at 1800, which will provides 975 kcals and 45 grams protein, meeting 46% of estimated kcal needs and 38% of estimated protein needs.  Discussed with pt and brother how pt will receive her nutrition and what diet advancement might look like as she progresses.   Labs reviewed: CBGS: 204 (inpatient orders for glycemic control are 0-9 units insulin aspart every 4 hours).   NUTRITION - FOCUSED PHYSICAL EXAM:    Most Recent Value  Orbital Region Moderate depletion  Upper Arm Region No depletion  Thoracic and Lumbar Region No depletion  Buccal Region Mild depletion  Temple Region Moderate depletion  Clavicle Bone Region Moderate depletion  Clavicle and Acromion Bone Region Mild depletion  Scapular Bone Region Mild depletion  Dorsal Hand No depletion  Patellar Region No depletion  Anterior Thigh Region No depletion  Posterior Calf Region No depletion  Edema (RD Assessment) Mild  Hair Reviewed  Eyes Reviewed  Mouth Reviewed  Skin Reviewed  Nails Reviewed       Diet Order:   Diet Order    None      EDUCATION NEEDS:   Education needs have been addressed  Skin:  Skin Assessment: Skin Integrity Issues: Skin Integrity Issues:: Incisions Incisions: closed abdomen  Last BM:  11/21/19  Height:   Ht Readings from Last 1 Encounters:  11/25/19 5' 6.5" (1.689 m)    Weight:   Wt Readings from Last 1 Encounters:  11/25/19 90 kg    Ideal Body Weight:  60.2 kg  BMI:  Body mass index is 31.55 kg/m.  Estimated Nutritional Needs:   Kcal:  2100-2300  Protein:  120-135 grams  Fluid:  > 2.1 L    Levada Schilling, RD, LDN, CDCES Registered Dietitian II Certified Diabetes Care and Education Specialist Please refer to [facility] for RD and/or RD on-call/weekend/after hours pager

## 2019-11-27 DIAGNOSIS — E43 Unspecified severe protein-calorie malnutrition: Secondary | ICD-10-CM | POA: Insufficient documentation

## 2019-11-27 LAB — CBC WITH DIFFERENTIAL/PLATELET
Abs Immature Granulocytes: 3.49 10*3/uL — ABNORMAL HIGH (ref 0.00–0.07)
Basophils Absolute: 0.1 10*3/uL (ref 0.0–0.1)
Basophils Relative: 0 %
Eosinophils Absolute: 0 10*3/uL (ref 0.0–0.5)
Eosinophils Relative: 0 %
HCT: 16.8 % — ABNORMAL LOW (ref 36.0–46.0)
Hemoglobin: 5.7 g/dL — CL (ref 12.0–15.0)
Immature Granulocytes: 12 %
Lymphocytes Relative: 9 %
Lymphs Abs: 2.4 10*3/uL (ref 0.7–4.0)
MCH: 24.2 pg — ABNORMAL LOW (ref 26.0–34.0)
MCHC: 33.9 g/dL (ref 30.0–36.0)
MCV: 71.2 fL — ABNORMAL LOW (ref 80.0–100.0)
Monocytes Absolute: 1.7 10*3/uL — ABNORMAL HIGH (ref 0.1–1.0)
Monocytes Relative: 6 %
Neutro Abs: 20.3 10*3/uL — ABNORMAL HIGH (ref 1.7–7.7)
Neutrophils Relative %: 73 %
Platelets: 322 10*3/uL (ref 150–400)
RBC: 2.36 MIL/uL — ABNORMAL LOW (ref 3.87–5.11)
RDW: 18.8 % — ABNORMAL HIGH (ref 11.5–15.5)
WBC Morphology: INCREASED
WBC: 28.1 10*3/uL — ABNORMAL HIGH (ref 4.0–10.5)
nRBC: 0.2 % (ref 0.0–0.2)

## 2019-11-27 LAB — GLUCOSE, CAPILLARY
Glucose-Capillary: 122 mg/dL — ABNORMAL HIGH (ref 70–99)
Glucose-Capillary: 126 mg/dL — ABNORMAL HIGH (ref 70–99)
Glucose-Capillary: 147 mg/dL — ABNORMAL HIGH (ref 70–99)
Glucose-Capillary: 156 mg/dL — ABNORMAL HIGH (ref 70–99)
Glucose-Capillary: 170 mg/dL — ABNORMAL HIGH (ref 70–99)
Glucose-Capillary: 179 mg/dL — ABNORMAL HIGH (ref 70–99)

## 2019-11-27 LAB — COMPREHENSIVE METABOLIC PANEL
ALT: 9 U/L (ref 0–44)
AST: 13 U/L — ABNORMAL LOW (ref 15–41)
Albumin: 1.7 g/dL — ABNORMAL LOW (ref 3.5–5.0)
Alkaline Phosphatase: 45 U/L (ref 38–126)
Anion gap: 9 (ref 5–15)
BUN: 11 mg/dL (ref 6–20)
CO2: 26 mmol/L (ref 22–32)
Calcium: 7.9 mg/dL — ABNORMAL LOW (ref 8.9–10.3)
Chloride: 107 mmol/L (ref 98–111)
Creatinine, Ser: 0.76 mg/dL (ref 0.44–1.00)
GFR calc Af Amer: >60 mL/min (ref >60)
GFR calc non Af Amer: >60 mL/min (ref >60)
Glucose, Bld: 176 mg/dL — ABNORMAL HIGH (ref 70–99)
Potassium: 3.6 mmol/L (ref 3.5–5.1)
Sodium: 142 mmol/L (ref 135–145)
Total Bilirubin: 0.5 mg/dL (ref 0.3–1.2)
Total Protein: 4.8 g/dL — ABNORMAL LOW (ref 6.5–8.1)

## 2019-11-27 LAB — PHOSPHORUS: Phosphorus: 1.9 mg/dL — ABNORMAL LOW (ref 2.5–4.6)

## 2019-11-27 LAB — MAGNESIUM: Magnesium: 2.1 mg/dL (ref 1.7–2.4)

## 2019-11-27 MED ORDER — TRACE MINERALS CU-MN-SE-ZN 300-55-60-3000 MCG/ML IV SOLN
INTRAVENOUS | Status: AC
  Filled 2019-11-27: qty 684

## 2019-11-27 MED ORDER — BOOST / RESOURCE BREEZE PO LIQD CUSTOM
1.0000 | Freq: Three times a day (TID) | ORAL | Status: DC
  Administered 2019-11-27 (×2): 1 via ORAL

## 2019-11-27 MED ORDER — INSULIN ASPART 100 UNIT/ML ~~LOC~~ SOLN
0.0000 [IU] | SUBCUTANEOUS | Status: DC
  Administered 2019-11-27: 2 [IU] via SUBCUTANEOUS
  Administered 2019-11-27: 3 [IU] via SUBCUTANEOUS
  Administered 2019-11-27 – 2019-11-28 (×2): 2 [IU] via SUBCUTANEOUS
  Administered 2019-11-28: 3 [IU] via SUBCUTANEOUS
  Administered 2019-11-28: 2 [IU] via SUBCUTANEOUS
  Administered 2019-11-28 – 2019-11-29 (×5): 3 [IU] via SUBCUTANEOUS
  Administered 2019-11-29 – 2019-11-30 (×7): 2 [IU] via SUBCUTANEOUS
  Administered 2019-12-01: 3 [IU] via SUBCUTANEOUS
  Administered 2019-12-01: 2 [IU] via SUBCUTANEOUS
  Administered 2019-12-01: 3 [IU] via SUBCUTANEOUS
  Administered 2019-12-01: 2 [IU] via SUBCUTANEOUS
  Administered 2019-12-01 – 2019-12-02 (×2): 3 [IU] via SUBCUTANEOUS
  Administered 2019-12-02 – 2019-12-03 (×6): 2 [IU] via SUBCUTANEOUS
  Administered 2019-12-03: 3 [IU] via SUBCUTANEOUS
  Administered 2019-12-03 – 2019-12-04 (×6): 2 [IU] via SUBCUTANEOUS
  Administered 2019-12-04: 3 [IU] via SUBCUTANEOUS
  Administered 2019-12-05 – 2019-12-06 (×6): 2 [IU] via SUBCUTANEOUS
  Administered 2019-12-06: 3 [IU] via SUBCUTANEOUS
  Administered 2019-12-06 (×2): 2 [IU] via SUBCUTANEOUS
  Administered 2019-12-07: 1 [IU] via SUBCUTANEOUS
  Administered 2019-12-07: 2 [IU] via SUBCUTANEOUS
  Administered 2019-12-07: 3 [IU] via SUBCUTANEOUS
  Administered 2019-12-07 – 2019-12-09 (×7): 2 [IU] via SUBCUTANEOUS

## 2019-11-27 MED ORDER — POTASSIUM PHOSPHATES 15 MMOLE/5ML IV SOLN
25.0000 mmol | Freq: Once | INTRAVENOUS | Status: AC
  Administered 2019-11-27: 25 mmol via INTRAVENOUS
  Filled 2019-11-27: qty 8.33

## 2019-11-27 MED ORDER — HYDROMORPHONE HCL 1 MG/ML IJ SOLN
1.0000 mg | INTRAMUSCULAR | Status: DC | PRN
  Administered 2019-11-27 – 2019-12-06 (×19): 1 mg via INTRAVENOUS
  Filled 2019-11-27 (×23): qty 1

## 2019-11-27 MED ORDER — OXYCODONE HCL 5 MG PO TABS
5.0000 mg | ORAL_TABLET | ORAL | Status: DC | PRN
  Administered 2019-11-28 – 2019-12-06 (×8): 10 mg via ORAL
  Filled 2019-11-27 (×10): qty 2

## 2019-11-27 MED ORDER — ACETAMINOPHEN 500 MG PO TABS
1000.0000 mg | ORAL_TABLET | Freq: Once | ORAL | Status: AC
  Administered 2019-11-27: 1000 mg via ORAL

## 2019-11-27 MED ORDER — ALPRAZOLAM 0.5 MG PO TABS
0.5000 mg | ORAL_TABLET | Freq: Three times a day (TID) | ORAL | Status: DC | PRN
  Administered 2019-11-27 – 2019-12-06 (×4): 0.5 mg via ORAL
  Filled 2019-11-27 (×5): qty 1

## 2019-11-27 MED ORDER — ACETAMINOPHEN 500 MG PO TABS
1000.0000 mg | ORAL_TABLET | Freq: Three times a day (TID) | ORAL | Status: DC
  Administered 2019-11-28 – 2019-12-12 (×21): 1000 mg via ORAL
  Filled 2019-11-27 (×30): qty 2

## 2019-11-27 NOTE — Progress Notes (Signed)
Patients hgb noted to be 5.7. I confirmed with patient that she does not wish for blood products/transfusion. Will plan for follow up CBC later tonight as well as in the AM.  We discussed that given patients dropping hgb, she is at increased risk for decompensation that could lead to a cardiac event or death. We discussed code status in detail. Patient wishes to be DNR/DNI. We filled out a MOST form that was signed by the patient and placed in her paper chart as well as scanned into the media section. Her code status was changed on electronic records as well.   Leary Roca, [facility]  11/27/2019, 2:57 PM  Please use AMION for pager number

## 2019-11-27 NOTE — TOC Initial Note (Signed)
Transition of Care [facility]) - Initial/Assessment Note    Patient Details  Name: [name] MRN: [id number] Date of Birth: [birth year]  Transition of Care [facility]) CM/SW Contact:    Kingsley Plan, RN Phone Number: 11/27/2019, 11:00 AM  Clinical Narrative:                  Confirmed face sheet information with patient at bedside.   Patient from home with sister who can provide assistance.   Discussed home wound negative pressure system. NCM submitted application to Traci with [employer] for home VAC. Patient voiced understanding that [employer] will submit to her insurance for approval, if approved [employer] will deliver home [facility] and supplies to patient's hospital room and discuss any co pays patient may have.   Patient will have HHRN with [facility] for dressing changes three times a week.   Expected Discharge Plan: Home w Home Health Services Barriers to Discharge: Continued Medical Work up   Patient Goals and CMS Choice Patient states their goals for this hospitalization and ongoing recovery are:: to return to home CMS Medicare.gov Compare Post Acute Care list provided to:: Patient Choice offered to / list presented to : Patient  Expected Discharge Plan and Services Expected Discharge Plan: Home w Home Health Services   Discharge Planning Services: CM Consult Post Acute Care Choice: Home Health, Durable Medical Equipment Living arrangements for the past 2 months: Apartment                 DME Arranged: Vac DME Agency: [employer] Date DME Agency Contacted: 11/27/19 Time DME Agency Contacted: 1058 Representative spoke with at DME Agency: Traci HH Arranged: PT, RN HH Agency: [facility] Date [facility] Agency Contacted: 11/27/19 Time HH Agency Contacted: 1059 Representative spoke with at [facility] Agency: Kandee Keen  Prior Living Arrangements/Services Living arrangements for the past 2 months: Apartment Lives with:: Siblings Patient language and need for interpreter reviewed:: Yes Do you feel safe going  back to the place where you live?: Yes      Need for Family Participation in Patient Care: Yes (Comment) Care giver support system in place?: Yes (comment)   Criminal Activity/Legal Involvement Pertinent to Current Situation/Hospitalization: No - Comment as needed  Activities of Daily Living Home Assistive Devices/Equipment: None ADL Screening (condition at time of admission) Patient's cognitive ability adequate to safely complete daily activities?: Yes Is the patient deaf or have difficulty hearing?: No Does the patient have difficulty seeing, even when wearing glasses/contacts?: No Does the patient have difficulty concentrating, remembering, or making decisions?: No Patient able to express need for assistance with ADLs?: Yes Does the patient have difficulty dressing or bathing?: No Independently performs ADLs?: Yes (appropriate for developmental age) Does the patient have difficulty walking or climbing stairs?: No Weakness of Legs: None Weakness of Arms/Hands: None  Permission Sought/Granted   Permission granted to share information with : Yes, Verbal Permission Granted     Permission granted to share info w AGENCY: [facility]        Emotional Assessment Appearance:: Appears stated age Attitude/Demeanor/Rapport: Engaged Affect (typically observed): Accepting Orientation: : Oriented to Situation, Oriented to  Time, Oriented to Place, Oriented to Self Alcohol / Substance Use: Not Applicable Psych Involvement: No (comment)  Admission diagnosis:  Diverticulitis [K57.92] Intra-abdominal abscess [facility]) [K65.1] Patient Active Problem List   Diagnosis Date Noted   Diverticulitis of large intestine with abscess 11/14/2019   Essential hypertension 11/14/2019   Anemia, chronic disease 11/14/2019   Severe sepsis (HCC) 11/06/2019  Diverticulitis 11/02/2019   Intra-abdominal abscess (HCC) 11/02/2019   PCP:  Soundra Pilon, FNP Pharmacy:   [id number] [place],  -  [address] [place] [address] Phone: [phone] Fax: [phone]     Social Determinants of Health (SDOH) Interventions    Readmission Risk Interventions No flowsheet data found.

## 2019-11-27 NOTE — Progress Notes (Signed)
   11/27/19 0243  Assess: MEWS Score  Temp 97.7 F (36.5 C)  BP 99/67  Pulse Rate 99  Resp 13  SpO2 100 %  Assess: MEWS Score  MEWS Temp 0  MEWS Systolic 1  MEWS Pulse 0  MEWS RR 1  MEWS LOC 0  MEWS Score 2  MEWS Score Color Yellow  Assess: if the MEWS score is Yellow or Red  Were vital signs taken at a resting state? Yes  Focused Assessment No change from prior assessment  Early Detection of Sepsis Score *See Row Information* Low  MEWS guidelines implemented *See Row Information* No, vital signs rechecked  Notify: Charge Nurse/RN  Name of Charge Nurse/RN Notified Marin Roberts RN  Date Charge Nurse/RN Notified 11/27/19  Time Charge Nurse/RN Notified 0300  Pt BP slightly hypotensive also experiencing slight bradypnea.  Vital signs re-assessed approximately one hour after initial assessment with VSS.  Pt currently resting in bed with no acute changes observed at this time.  Will con't to monitor.

## 2019-11-27 NOTE — Progress Notes (Signed)
Nutrition Follow-up  DOCUMENTATION CODES:   Severe malnutrition in context of acute illness/injury  INTERVENTION:   -TPN management per pharmacy -Boost Breeze po BID, each supplement provides 250 kcal and 9 grams of protein -RD will follow for diet advancement and adjust supplement regimen as appropriate  NUTRITION DIAGNOSIS:   Severe Malnutrition related to acute illness (perforated diverticulitis with intra-abdominal abscess) as evidenced by mild fat depletion, moderate fat depletion, mild muscle depletion, moderate muscle depletion, percent weight loss.  Ongoing  GOAL:   Patient will meet greater than or equal to 90% of their needs  Progressing   MONITOR:   Diet advancement, Labs, Weight trends, Skin, I & O's  REASON FOR ASSESSMENT:   Consult New TPN/TNA  ASSESSMENT:   [name] is a 54yo female PMH HTN and recurrent diverticulitis who returned to the ED today with 2 days of worsening abdominal pain.  8/24- s/pPROCEDURE PERFORMED:Open sigmoid colectomy and end colostomy (Hartmann's procedure), small bowel resection, incision and drainage of abdominal wall abscess with debridement of skin and subcutaneous fatty tissue 8/25- s/p PICC, TPN initiated 8/26- advanced to clear liquid diet, abdominal wound vac placed  Reviewed I/O's: -1.6 L x 24 hours and +506 ml since admission  UOP: 1.7 L x 24 hours  Pt advanced to clear liquid diet this AM.   Pt receiving TPN at 40 ml/hr, which will provides 975 kcals and 45 grams protein, meeting 46% of estimated kcal needs and 38% of estimated protein needs. Per pharmacy note, plan to increase TPN to 70 ml/hr at 1800, which provides 1728 kcals and 103 grams of protein, meeting 82% of estimated kcal needs and 90% of estimated protein needs.  Labs reviewed: Phos: 1.9 (on IV supplementation), K and Mg WDL. CBGS: 147.   Diet Order:   Diet Order            Diet clear liquid Room service appropriate? Yes; Fluid consistency:  Thin  Diet effective now                 EDUCATION NEEDS:   Education needs have been addressed  Skin:  Skin Assessment: Skin Integrity Issues: Skin Integrity Issues:: Wound VAC Wound Vac: abdomen Incisions: closed abdomen  Last BM:  11/25/19  Height:   Ht Readings from Last 1 Encounters:  11/25/19 5' 6.5" (1.689 m)    Weight:   Wt Readings from Last 1 Encounters:  11/25/19 90 kg    Ideal Body Weight:  60.2 kg  BMI:  Body mass index is 31.55 kg/m.  Estimated Nutritional Needs:   Kcal:  2100-2300  Protein:  120-135 grams  Fluid:  > 2.1 L    Levada Schilling, RD, LDN, CDCES Registered Dietitian II Certified Diabetes Care and Education Specialist Please refer to [facility] for RD and/or RD on-call/weekend/after hours pager

## 2019-11-27 NOTE — Care Management (Signed)
[employer] home [facility] application signed by PA and given to [place] with [employer].   Confirmed with Kandee Keen with [facility] they can still accept for [facility] for ostomy and VAC care.   Ronny Flurry RN

## 2019-11-27 NOTE — Progress Notes (Addendum)
CRITICAL VALUE ALERT  Critical Value:  Hgb 5.7  Date & Time Notied:  11/27/19  @1250pm   Provider Notified: PA 11/27/19 @ 1300  Orders Received/Actions taken: pending at this time     Discussed with patient Hgb results and implications to health; patient continues to  decline transfusion.  MD aware and new order for q4hr CBCs entered.

## 2019-11-27 NOTE — Consult Note (Signed)
WOC Nurse Consult Note: Reason for Consult: placement of NPWT dressing to the midline surgical incision Wound type: surgical; full thickness Pressure Injury POA: NA Measurement: 16cm x 14cm x 4cm  Wound bed: mostly clean subcutaneous tissue; darkening at the most lateral aspects of the wound bed left and right, visible suture central base of the wound bed Drainage (amount, consistency, odor) serosanguinous  Periwound: intact  Dressing procedure/placement/frequency: 3pc of black foam used to fill the wound bed Seal at HG Patient using PCA during dressing application; tolerated well.    WOC Nurse ostomy follow up Stoma type/location: LUQ, end colostomy Stomal assessment/size: 1 1/2 round; os at 11 oclock  Peristomal assessment: intact but with dipping in the abdominal topography at 3 and 9 o'clock   Treatment options for stomal/peristomal skin: 2" barrier ring Output bloody Ostomy pouching: converted to one pc convex to aid in seal with dips in the skin along the medial and lateral aspect of the stoma Education provided:  Explained stoma characteristics (budded, flush, color, texture, care) Demonstrated pouch change (cutting new skin barrier, measuring stoma, cleaning peristomal skin and stoma, use of barrier ring) Education on emptying when 1/3 to 1/2 full and how to empty Patient using PCA during change; engaged to learn   Enrolled patient in [employer] Discharge program: Yes  WOC Nurse will follow along with you for continued support with ostomy teaching and care and NPWT dressing changes that will need to occur with pouch changes.  Thanh Mottern [facility] MSN, RN, [place], CNS, [place] [phone]

## 2019-11-27 NOTE — Progress Notes (Signed)
2 Days Post-Op  Subjective: CC: Doing well. Some soreness around her midline incision that is controlled with PCA. No n/v. Thirsty. Was able to stand and walk a small amount with RW in the room yesterday. Has no ambulated in halls. No flatus or bm from ostomy yet.   Objective: Vital signs in last 24 hours: Temp:  [97.6 F (36.4 C)-99.3 F (37.4 C)] 98.1 F (36.7 C) (08/26 0404) Pulse Rate:  [96-119] 96 (08/26 0404) Resp:  [13-19] 18 (08/26 0823) BP: (97-106)/(63-81) 97/66 (08/26 0404) SpO2:  [98 %-100 %] 100 % (08/26 0823) Last BM Date: 11/21/19  Intake/Output from previous day: 08/25 0701 - 08/26 0700 In: 60 [P.O.:60] Out: 1675 [Urine:1675] Intake/Output this shift: No intake/output data recorded.  PE: Gen:  Alert, NAD, pleasant HEENT: EOM's intact, pupils equal and round Card:  RRR Pulm:  CTAB, no W/R/R, effort normal. On 1L o2 Abd: Soft, mild distension, there is tenderness of the lower abdomen and over the midline wound that appears appropriate. No peritonitis. Hypoactive BS. Colostomy in place. Stoma budded and red. There is some sweat in the colostomy bag. No air or stool in the bag. Midline wound as noted in the pictures below.  Ext:  No LE edema  Psych: A&Ox3  Skin: Wound as noted above. Otherwise no rashes noted, warm and dry     Lab Results:  Recent Labs    11/25/19 1929 11/26/19 0423  WBC 42.3* 40.3*  HGB 8.2* 7.5*  HCT 23.7* 21.3*  PLT 420* 377   BMET Recent Labs    11/26/19 0423 11/27/19 0433  NA 138 142  K 4.4 3.6  CL 105 107  CO2 25 26  GLUCOSE 187* 176*  BUN 12 11  CREATININE 0.74 0.76  CALCIUM 7.9* 7.9*   PT/INR Recent Labs    11/24/19 0929  LABPROT 16.6*  INR 1.4*   CMP     Component Value Date/Time   NA 142 11/27/2019 0433   K 3.6 11/27/2019 0433   CL 107 11/27/2019 0433   CO2 26 11/27/2019 0433   GLUCOSE 176 (H) 11/27/2019 0433   BUN 11 11/27/2019 0433   CREATININE 0.76 11/27/2019 0433   CALCIUM 7.9 (L) 11/27/2019  0433   PROT 4.8 (L) 11/27/2019 0433   ALBUMIN 1.7 (L) 11/27/2019 0433   AST 13 (L) 11/27/2019 0433   ALT 9 11/27/2019 0433   ALKPHOS 45 11/27/2019 0433   BILITOT 0.5 11/27/2019 0433   GFRNONAA >60 11/27/2019 0433   GFRAA >60 11/27/2019 0433   Lipase  No results found for: LIPASE     Studies/Results: [place] EKG SITE RITE  Result Date: 11/25/2019 If Site Rite image not attached, placement could not be confirmed due to current cardiac rhythm.   Anti-infectives: Anti-infectives (From admission, onward)   Start     Dose/Rate Route Frequency Ordered Stop   11/25/19 1700  fluconazole (DIFLUCAN) IVPB 400 mg        400 mg 100 mL/hr over 120 Minutes Intravenous Every 24 hours 11/25/19 1649     11/24/19 2000  vancomycin (VANCOCIN) IVPB 1000 mg/200 mL premix  Status:  Discontinued        1,000 mg 200 mL/hr over 60 Minutes Intravenous Every 8 hours 11/24/19 1111 11/25/19 1649   11/24/19 1400  piperacillin-tazobactam (ZOSYN) IVPB 3.375 g        3.375 g 12.5 mL/hr over 240 Minutes Intravenous Every 8 hours 11/24/19 1355     11/24/19 1015  ceFEPIme (  MAXIPIME) 2 g in sodium chloride 0.9 % 100 mL IVPB        2 g 200 mL/hr over 30 Minutes Intravenous  Once 11/24/19 1003 11/24/19 1130   11/24/19 1015  metroNIDAZOLE (FLAGYL) IVPB 500 mg        500 mg 100 mL/hr over 60 Minutes Intravenous  Once 11/24/19 1003 11/24/19 1400   11/24/19 0945  vancomycin (VANCOCIN) IVPB 1000 mg/200 mL premix  Status:  Discontinued        1,000 mg 200 mL/hr over 60 Minutes Intravenous  Once 11/24/19 0931 11/24/19 0939   11/24/19 0945  vancomycin (VANCOREADY) IVPB 1500 mg/300 mL        1,500 mg 150 mL/hr over 120 Minutes Intravenous  Once 11/24/19 9563 11/24/19 1418       Assessment/Plan HTN - home med ABL Anemia - Hgb7.5 on 8/25. CBC pending this AM. Patient states that due to religious beliefs she would still not want any blood products and understands the risks Does not accept blood products. Pediatric tube  blood draws only to limit loss Malnutrition - prealbumin <5, TPN  Perforated diverticulitis with w/ feculent peritonitis and abdominal wall abscess - s/p open sigmoid colectomy and end colostomy (Hartmann's procedure), small bowel resection, incision and drainage of abdominal wall abscess with debridement of skin and subcutaneous fatty tissueby Dr. Donell Beers 8/24 - POD #2 - Cont abx  - D/c PCA. Transition to IV push and PO meds  - D/c foley  - Start CLD. AROBF - WOCN following for new ostomy - Wound vac for midline wound  - Mobilize. PT/OT. OT recommending HH  - Pulm toilet, IS - Path without evidence of malignancy. As noted below  ID -[email or web address]/23. Zosyn 8/23>>. Diflucan 8/24 >> WBC 40.3 yesterday. AM CBC pending. Bcx w/ no growth x 3 days VTE -SCDs, hold lovenox for now. Await AM cbc   FEN -IVF,CLD, boost breeze, TPN  Foley - Remove today  Follow up -Dr. Donell Beers  Path A. COLON, SIGMOID, RESECTION:  - Segment of colon (25 cm) showing diverticulosis, diverticulitis with  associated abscesses, serositis and serosal adhesions  - Serosal adhesions to portion of benign ovary  - Endometriosis  - Margins appear viable   B. SMALL BOWEL, SEGMENT OF ILEUM, RESECTION:  - Segment of small intestine (11.5 cm) with serositis and serosal  adhesions  - Margins appear viable      LOS: 3 days    [name] , [facility] 11/27/2019, 9:04 AM Please see Amion for pager number during day hours 7:00am-4:30pm

## 2019-11-27 NOTE — Progress Notes (Signed)
PHARMACY - TOTAL PARENTERAL NUTRITION CONSULT NOTE  Indication: Intolerance to enteral feeding  Patient Measurements: Height: 5' 6.5" (168.9 cm) Weight: 90 kg (198 lb 6.6 oz) IBW/kg (Calculated) : 60.44 TPN AdjBW (KG): 67.8 Body mass index is 31.55 kg/m.  Assessment:  3 YOF with recurrent diverticulitis who presented with worsening abdominal pain. She was recently admitted on 8/1 through 8/9 with complicated diverticulitis and multiple abscesses and underwent percutaneous drain x2 placement by IR on 8/3. CT abd on 8/23 showed numerous fistulous connections between the sigmoid colon and abscess cavity.  Planning ex-lap, partial colectomy, colostomy and I&D of abdominal wall abscess, so Pharmacy consulted to manage TPN due to poor enteral nutrition in setting of severe malnutrition.  Glucose / Insulin: A1c 5.5% - CBGs < 180 (borderline).  Used 8 units sSSI. Electrolytes: all WNL except Phos 1.9 (K low normal 3.6) Renal: SCr < 1, BUN WNL LFTs / TGs:  LFTs / tbili / TG WNL  Prealbumin / albumin: BL prealbumin <5, albumin 1.7 Intake / Output; MIVF: UOP 0.8 ml/kg/hr, no drain O/P, net +568mL GI Imaging: none since TPN Surgeries / Procedures:  8/24:oOpen sigmoid colectomy and end colostomy, SBR, I&D of abdominal wall abscess.   Central access: PICC placed 11/26/19 TPN start date: 11/26/19   Nutritional Goals (per RD rec on 8/25): 2100-2300 kCal, 125-135g protein, >2.1L fluid per day  Current Nutrition:  TPN  Plan:  Adjust TPN to increase provision and fluids - advance TPN to 70 ml/hr (goal rate 95 ml/hr) TPN will provide 103g AA, 234g CHO and 52g ILE for a total of 1728 kCal, meeting ~75% of patient needs Electrolytes in TPN: Na 12mEq/L, incr K 67mEq/L, Ca 21mEq/L, Mag 67mEq/L, Phos 52mmol/L, Cl:Ac 1:1 - lytes increase with increased TPN rate Add standard MVI and trace elements to TPN Increase SSI to moderate Q4H KPhos 25 mmol IV x 1 F/U AM labs  Jazzlynn Rawe D. Laney Potash, PharmD, BCPS,  BCCCP 11/27/2019, 7:25 AM

## 2019-11-28 LAB — CBC
HCT: 19.2 % — ABNORMAL LOW (ref 36.0–46.0)
Hemoglobin: 6.5 g/dL — CL (ref 12.0–15.0)
MCH: 24.3 pg — ABNORMAL LOW (ref 26.0–34.0)
MCHC: 33.9 g/dL (ref 30.0–36.0)
MCV: 71.6 fL — ABNORMAL LOW (ref 80.0–100.0)
Platelets: 431 10*3/uL — ABNORMAL HIGH (ref 150–400)
RBC: 2.68 MIL/uL — ABNORMAL LOW (ref 3.87–5.11)
RDW: 19.1 % — ABNORMAL HIGH (ref 11.5–15.5)
WBC: 22.8 10*3/uL — ABNORMAL HIGH (ref 4.0–10.5)
nRBC: 0.8 % — ABNORMAL HIGH (ref 0.0–0.2)

## 2019-11-28 LAB — BASIC METABOLIC PANEL
Anion gap: 9 (ref 5–15)
BUN: 11 mg/dL (ref 6–20)
CO2: 29 mmol/L (ref 22–32)
Calcium: 8.2 mg/dL — ABNORMAL LOW (ref 8.9–10.3)
Chloride: 106 mmol/L (ref 98–111)
Creatinine, Ser: 0.71 mg/dL (ref 0.44–1.00)
GFR calc Af Amer: >60 mL/min (ref >60)
GFR calc non Af Amer: >60 mL/min (ref >60)
Glucose, Bld: 135 mg/dL — ABNORMAL HIGH (ref 70–99)
Potassium: 3.1 mmol/L — ABNORMAL LOW (ref 3.5–5.1)
Sodium: 144 mmol/L (ref 135–145)

## 2019-11-28 LAB — GLUCOSE, CAPILLARY
Glucose-Capillary: 128 mg/dL — ABNORMAL HIGH (ref 70–99)
Glucose-Capillary: 169 mg/dL — ABNORMAL HIGH (ref 70–99)
Glucose-Capillary: 165 mg/dL — ABNORMAL HIGH (ref 70–99)
Glucose-Capillary: 163 mg/dL — ABNORMAL HIGH (ref 70–99)
Glucose-Capillary: 152 mg/dL — ABNORMAL HIGH (ref 70–99)
Glucose-Capillary: 163 mg/dL — ABNORMAL HIGH (ref 70–99)

## 2019-11-28 LAB — PHOSPHORUS: Phosphorus: 2.9 mg/dL (ref 2.5–4.6)

## 2019-11-28 LAB — MAGNESIUM: Magnesium: 2 mg/dL (ref 1.7–2.4)

## 2019-11-28 MED ORDER — POTASSIUM PHOSPHATES 15 MMOLE/5ML IV SOLN
10.0000 mmol | Freq: Once | INTRAVENOUS | Status: AC
  Administered 2019-11-28: 10 mmol via INTRAVENOUS
  Filled 2019-11-28: qty 3.33

## 2019-11-28 MED ORDER — METHOCARBAMOL 500 MG PO TABS
500.0000 mg | ORAL_TABLET | Freq: Four times a day (QID) | ORAL | Status: DC
  Administered 2019-11-28 – 2019-12-12 (×56): 500 mg via ORAL
  Filled 2019-11-28 (×55): qty 1

## 2019-11-28 MED ORDER — POTASSIUM CHLORIDE 10 MEQ/50ML IV SOLN
10.0000 meq | INTRAVENOUS | Status: AC
  Administered 2019-11-28 (×4): 10 meq via INTRAVENOUS
  Filled 2019-11-28 (×4): qty 50

## 2019-11-28 MED ORDER — DARBEPOETIN ALFA 40 MCG/0.4ML IJ SOSY
40.0000 ug | PREFILLED_SYRINGE | INTRAMUSCULAR | Status: DC
  Administered 2019-11-29 – 2019-12-05 (×2): 40 ug via SUBCUTANEOUS
  Filled 2019-11-28 (×4): qty 0.4

## 2019-11-28 MED ORDER — TRAVASOL 10 % IV SOLN
INTRAVENOUS | Status: AC
  Filled 2019-11-28: qty 1299.6

## 2019-11-28 MED ORDER — SODIUM CHLORIDE 0.9 % IV SOLN
510.0000 mg | INTRAVENOUS | Status: DC
  Administered 2019-11-29: 510 mg via INTRAVENOUS
  Filled 2019-11-28 (×2): qty 17

## 2019-11-28 MED ORDER — ENSURE SURGERY PO LIQD
237.0000 mL | Freq: Two times a day (BID) | ORAL | Status: DC
  Administered 2019-11-28 – 2019-12-05 (×7): 237 mL via ORAL
  Filled 2019-11-28 (×16): qty 237

## 2019-11-28 NOTE — Consult Note (Signed)
WOC Nurse ostomy follow up Stoma type/location: LUQ, end colostomy Stomal assessment/size: 1 1/2" round Peristomal assessment: NA Treatment options for stomal/peristomal skin: 2" barrier ring Output; mushy brown in pouch Ostomy pouching: 1pc.flex convex; 2" barrier ring.  Intact with patient sitting up in the chair. VAC dressing just to the right of the ostomy pouch is intact and with a good seal.  Encouraged patient to mobilize to determine if current pouching system will work well for patient at [employer] provided:  Dicussed ostomy pouching with patient; she is a little foggy today but ask appropriate questions.  Enrolled patient in [place] Secure Start Discharge program: Yes  WOC Nurse will follow along with you for continued support with ostomy teaching, care, and complex NPWT dressing change due to location of stoma.  Prestin Munch [facility] MSN, RN, [place], CNS, [place] [phone]

## 2019-11-28 NOTE — Progress Notes (Signed)
PHARMACY - TOTAL PARENTERAL NUTRITION CONSULT NOTE  Indication: Intolerance to enteral feeding  Patient Measurements: Height: 5' 6.5" (168.9 cm) Weight: 90 kg (198 lb 6.6 oz) IBW/kg (Calculated) : 60.44 TPN AdjBW (KG): 67.8 Body mass index is 31.55 kg/m.  Assessment:  59 YOF with recurrent diverticulitis who presented with worsening abdominal pain. She was recently admitted on 8/1 through 8/9 with complicated diverticulitis and multiple abscesses and underwent percutaneous drain x2 placement by IR on 8/3. CT abd on 8/23 showed numerous fistulous connections between the sigmoid colon and abscess cavity.  Planning ex-lap, partial colectomy, colostomy and I&D of abdominal wall abscess, so Pharmacy consulted to manage TPN due to poor enteral nutrition in setting of severe malnutrition.  Glucose / Insulin: A1c 5.5% - CBGs well controlled.  Used 10 units mSSI in past 24 hrs. Electrolytes: Na high normal, K 3.1, others WNL Renal: SCr < 1, BUN WNL LFTs / TGs:  LFTs / tbili / TG WNL  Prealbumin / albumin: BL prealbumin <5, albumin 1.7 Intake / Output; MIVF: UOP 0.6 ml/kg/hr, net +946mL GI Imaging: none since TPN Surgeries / Procedures:  8/24: open sigmoid colectomy and end colostomy, SBR, I&D of abdominal wall abscess.   Central access: PICC placed 11/26/19 TPN start date: 11/26/19   Nutritional Goals (per RD rec on 8/25): 2100-2300 kCal, 125-135g protein, >2.1L fluid per day  Current Nutrition:  TPN Boost TID (2 charted given) Clear liquid diet started 8/26  Plan:  Advance TPN to goal rate 95 ml/hr, providing 130g AA, 296g CHO and 66g ILE for a total of 2190 kCal, meeting 100% of patient needs Electrolytes in TPN: decr Na 18mEq/L, incr K 25mEq/L (= 114 mEq/d), Ca 39mEq/L, Mag 9mEq/L, Phos 67mmol/L, Cl:Ac 1:1 - lytes increase with increased TPN rate Add standard MVI and trace elements to TPN Continue moderate SSI Q4H KCL x 4 runs KPhos 10 mmol IV x 1 F/U AM labs  Piedad Standiford D. Laney Potash,  PharmD, BCPS, BCCCP 11/28/2019, 7:13 AM

## 2019-11-28 NOTE — Progress Notes (Signed)
MEDICATION RELATED CONSULT NOTE - INITIAL   Pharmacy Consult for Aranesp Indication: anemia  Allergies  Allergen Reactions  . Aspirin     diarheea , stomach ache.     Patient Measurements: Height: 5' 6.5" (168.9 cm) Weight: 90 kg (198 lb 6.6 oz) IBW/kg (Calculated) : 60.44   Labs: Recent Labs    11/26/19 0423 11/27/19 0433 11/27/19 1147 11/28/19 0352  WBC 40.3*  --  28.1* 22.8*  HGB 7.5*  --  5.7* 6.5*  HCT 21.3*  --  16.8* 19.2*  PLT 377  --  322 431*  CREATININE 0.74 0.76  --  0.71  MG 2.0 2.1  --  2.0  PHOS 3.7 1.9*  --  2.9  ALBUMIN 1.8* 1.7*  --   --   PROT 5.4* 4.8*  --   --   AST 14* 13*  --   --   ALT 8 9  --   --   ALKPHOS 52 45  --   --   BILITOT 0.6 0.5  --   --    Estimated Creatinine Clearance: 90.6 mL/min (by C-G formula based on SCr of 0.71 mg/dL).  Medical History: Past Medical History:  Diagnosis Date  . Diverticulitis   . Hypertension   . Refusal of blood product    Assessment:  To begin Aranesp for anemia. No blood transfusions for religious reasons.  Hgb 10.5 on admit 8/23, has trended down.  No bleeding reported.    Will begin Aranesp ~0.45 mcg/kg SQ weekly per [facility] protocol.  Iron studies deferred due to IV iron planned today.  Goal of Therapy:    Improved,Hgb  Plan:   Aranesp 40 mcg SQ weekly for up to 4 doses.  Slow onset, may take a few weeks to see effect on Hgb.  Fereheme 510 mg IV x 1 today; may repeat in 3-8 days.  Follow CBC; retic count in am and weekly.   Dennie Fetters, RPh Phone: [phone] 11/28/2019,2:50 PM

## 2019-11-28 NOTE — Progress Notes (Signed)
3 Days Post-Op  Subjective: CC: Patient reports that she has continued pain on the left side of her abdomen similar to that of yesterday.  She did not ask for any as needed medications until around 5 PM yesterday.  She is tolerating clear liquid diet (but not drinking much) without any nausea or vomiting.  No burping or belching.  She does feel like it gets stuck in her throat sometimes.  She has some stool output in her colostomy bag this morning.  VAC placed yesterday. Foley out and voiding.   Please see discussion yesterday about patient's anemia and CODE STATUS.  Objective: Vital signs in last 24 hours: Temp:  [98 F (36.7 C)-98.7 F (37.1 C)] 98 F (36.7 C) (08/27 0500) Pulse Rate:  [95-97] 97 (08/27 0500) Resp:  [18] 18 (08/27 0500) BP: (105-107)/(80-82) 107/80 (08/27 0500) SpO2:  [100 %] 100 % (08/27 0500) Last BM Date: 11/25/19  Intake/Output from previous day: 08/26 0701 - 08/27 0700 In: 1656.7 [I.V.:744.2; IV Piggyback:912.5] Out: 1200 [Urine:1200] Intake/Output this shift: No intake/output data recorded.  PE: Gen: Alert, NAD, pleasant HEENT: EOM's intact, pupils equal and round Card: RRR Pulm: CTAB, no W/R/R, effort normal. Off o2 Abd: Soft,mild distension, there is tenderness of the left abdomen and over the midline wound. No peritonitis. +BS . Colostomy in place. Stoma budded and pink. There is some brown stool in the bag without any air. Midline wound with vac in place with ss output in cannister.  Ext: No LE edema Psych: A&Ox3  Skin:Wound as noted above. Otherwiseno rashes noted, warm and dry  Lab Results:  Recent Labs    11/27/19 1147 11/28/19 0352  WBC 28.1* 22.8*  HGB 5.7* 6.5*  HCT 16.8* 19.2*  PLT 322 431*   BMET Recent Labs    11/27/19 0433 11/28/19 0352  NA 142 144  K 3.6 3.1*  CL 107 106  CO2 26 29  GLUCOSE 176* 135*  BUN 11 11  CREATININE 0.76 0.71  CALCIUM 7.9* 8.2*   PT/INR No results for input(s): LABPROT, INR in  the last 72 hours. CMP     Component Value Date/Time   NA 144 11/28/2019 0352   K 3.1 (L) 11/28/2019 0352   CL 106 11/28/2019 0352   CO2 29 11/28/2019 0352   GLUCOSE 135 (H) 11/28/2019 0352   BUN 11 11/28/2019 0352   CREATININE 0.71 11/28/2019 0352   CALCIUM 8.2 (L) 11/28/2019 0352   PROT 4.8 (L) 11/27/2019 0433   ALBUMIN 1.7 (L) 11/27/2019 0433   AST 13 (L) 11/27/2019 0433   ALT 9 11/27/2019 0433   ALKPHOS 45 11/27/2019 0433   BILITOT 0.5 11/27/2019 0433   GFRNONAA >60 11/28/2019 0352   GFRAA >60 11/28/2019 0352   Lipase  No results found for: LIPASE     Studies/Results: No results found.  Anti-infectives: Anti-infectives (From admission, onward)   Start     Dose/Rate Route Frequency Ordered Stop   11/25/19 1700  fluconazole (DIFLUCAN) IVPB 400 mg        400 mg 100 mL/hr over 120 Minutes Intravenous Every 24 hours 11/25/19 1649 11/29/19 2359   11/24/19 2000  vancomycin (VANCOCIN) IVPB 1000 mg/200 mL premix  Status:  Discontinued        1,000 mg 200 mL/hr over 60 Minutes Intravenous Every 8 hours 11/24/19 1111 11/25/19 1649   11/24/19 1400  piperacillin-tazobactam (ZOSYN) IVPB 3.375 g        3.375 g 12.5 mL/hr over 240  Minutes Intravenous Every 8 hours 11/24/19 1355 11/29/19 2359   11/24/19 1015  ceFEPIme (MAXIPIME) 2 g in sodium chloride 0.9 % 100 mL IVPB        2 g 200 mL/hr over 30 Minutes Intravenous  Once 11/24/19 1003 11/24/19 1130   11/24/19 1015  metroNIDAZOLE (FLAGYL) IVPB 500 mg        500 mg 100 mL/hr over 60 Minutes Intravenous  Once 11/24/19 1003 11/24/19 1400   11/24/19 0945  vancomycin (VANCOCIN) IVPB 1000 mg/200 mL premix  Status:  Discontinued        1,000 mg 200 mL/hr over 60 Minutes Intravenous  Once 11/24/19 0931 11/24/19 0939   11/24/19 0945  vancomycin (VANCOREADY) IVPB 1500 mg/300 mL        1,500 mg 150 mL/hr over 120 Minutes Intravenous  Once 11/24/19 0939 11/24/19 1418       Assessment/Plan HTN - home med ABLAnemia - Hgb7.5> 5.7 >  6.5. Patient states that due to religious beliefs she would still not want any blood products and understands the risks. Pediatric tube blood draws only to limit loss Code status - DNR/DNI. Note from 8/26 "We discussed that given patients dropping hgb, she is at increased risk for decompensation that could lead to a cardiac event or death. We discussed code status in detail. Patient wishes to be DNR/DNI. We filled out a MOST form that was signed by the patient and placed in her paper chart as well as scanned into the media section. Her code status was changed on electronic records as well." Malnutrition - prealbumin <5, TPN  Perforated diverticulitis with w/feculent peritonitisand abdominal wall abscess - s/p open sigmoid colectomy and end colostomy (Hartmann's procedure), small bowel resection, incision and drainage of abdominal wall abscess with debridement of skin and subcutaneous fatty tissueby Dr. Donell Beers 8/24 - POD #3 - Cont abx through saturday - Adv to FLD - WOCN following for new ostomy and wound vac. Plans for m/w/f changes - Mobilize. PT/OT. Recommending HH  - Pulm toilet, IS - Path without evidence of malignancy. As noted below  ID -[email or web address]/23.Zosyn 8/23>>.Diflucan 8/24 >> WBC 22.8.Bcx w/ no growth x 4 days VTE -SCDs,hold lovenox 2/2 anemia  FEN - FLD, ensure, TPN  Foley -Dc'd 8/26 Follow up -Dr. Donell Beers  Path A. COLON, SIGMOID, RESECTION:  - Segment of colon (25 cm) showing diverticulosis, diverticulitis with  associated abscesses, serositis and serosal adhesions  - Serosal adhesions to portion of benign ovary  - Endometriosis  - Margins appear viable   B. SMALL BOWEL, SEGMENT OF ILEUM, RESECTION:  - Segment of small intestine (11.5 cm) with serositis and serosal  adhesions  - Margins appear viable    LOS: 4 days    [name] , [facility] 11/28/2019, 8:23 AM Please see Amion for pager number during day  hours 7:00am-4:30pm

## 2019-11-28 NOTE — Progress Notes (Signed)
Occupational Therapy Treatment Patient Details Name: [name] MRN: [id number] DOB: [birth year] Today's Date: 11/28/2019    History of present illness 54yo female PMH HTN and recurrent diverticulitis who returned to the ED today with 2 days of worsening abdominal pain.  Patient was admitted 8/1 through 8/9 with complicated diverticulitis and multiple abscesses. CT scan shows significantly increased size of large abscess within the soft tissues of the lower anterior abdominal wall now measuring up to approximately 21 cm, previously 8 cm. Pt is now s/p Open sigmoid colectomy and end colostomy (Hartmann's procedure), small bowel resection, incision and drainage of abdominal wall abscess with debridement of skin and subcutaneous fatty tissue on 8/24.   OT comments  Pt. Seen for skilled OT treatment session.  Bed mobility completed with min/mod a to bring trunk upright, no other physical assistance needed once upright.  In room ambulation for continued work with functional mobility with ADL integration min guard A. No LOB noted today, pt. Reports feeling "less weak".  Able to perform LB dressing seated.  States she also has family that can assist as needed.    Follow Up Recommendations  Home health OT;Supervision - Intermittent;Other (comment)    Equipment Recommendations  3 in 1 bedside commode    Recommendations for Other Services      Precautions / Restrictions Precautions Precautions: Fall Restrictions Weight Bearing Restrictions: No       Mobility Bed Mobility Overal bed mobility: Needs Assistance Bed Mobility: Supine to Sit;Sidelying to Sit;Rolling Rolling: Min guard Sidelying to sit: Min assist Supine to sit: Min assist     General bed mobility comments: Attempted logroll, ultimately completed as more supine>sit. Assist during powerup of trunk, pt able to move LEs to EOB without assistance  Transfers                      Balance                                            ADL either performed or assessed with clinical judgement   ADL Overall ADL's : Needs assistance/impaired                     Lower Body Dressing: Minimal assistance;Sitting/lateral leans Lower Body Dressing Details (indicate cue type and reason): able to cross each leg over knee to reach feet for sock don/adjustment Toilet Transfer: Min Administrator Details (indicate cue type and reason): simulated from eob in room ambulation around the bed to recliner         Functional mobility during ADLs: Min guard;Rolling walker General ADL Comments: bed mobility, in room functional mobility, simulated toileting (recliner), min guard throughout no LOB today. pt. reports she is completed UB/LB bathing with setup     Vision       Perception     Praxis      Cognition Arousal/Alertness: Awake/alert Behavior During Therapy: Flat affect Overall Cognitive Status: Within Functional Limits for tasks assessed                                          Exercises     Shoulder Instructions       General Comments      Pertinent Vitals/ Pain  Pain Assessment: 0-10 Pain Score: 6  Pain Location: abdomen Pain Descriptors / Indicators: Grimacing;Guarding;Sore Pain Intervention(s): Limited activity within patient's tolerance;Monitored during session;Repositioned  Home Living                                          Prior Functioning/Environment              Frequency  Min 2X/week        Progress Toward Goals  OT Goals(current goals can now be found in the care plan section)  Progress towards OT goals: Progressing toward goals  ADL Goals Pt Will Perform Lower Body Bathing: with modified independence;sit to/from stand Pt Will Perform Lower Body Dressing: with modified independence;sit to/from stand Pt Will Transfer to Toilet: with modified independence;ambulating Pt Will Perform Toileting -  Clothing Manipulation and hygiene: with modified independence;sit to/from stand  Plan      Co-evaluation                 AM-PAC OT "6 Clicks" Daily Activity     Outcome Measure   Help from another person eating meals?: None Help from another person taking care of personal grooming?: A Little Help from another person toileting, which includes using toliet, bedpan, or urinal?: A Little Help from another person bathing (including washing, rinsing, drying)?: A Little Help from another person to put on and taking off regular upper body clothing?: A Little Help from another person to put on and taking off regular lower body clothing?: A Little 6 Click Score: 19    End of Session Equipment Utilized During Treatment: Rolling walker  OT Visit Diagnosis: Unsteadiness on feet (R26.81);Pain;Muscle weakness (generalized) (M62.81);History of falling (Z91.81)   Activity Tolerance Patient tolerated treatment well   Patient Left in chair;with call bell/phone within reach   Nurse Communication          Time: 0910-0930 OT Time Calculation (min): 20 min  Charges: OT General Charges $OT Visit: 1 Visit OT Treatments $Self Care/Home Management : 8-22 mins  Boneta Lucks, COTA/L Acute Rehabilitation [phone]   Robet Leu 11/28/2019, 10:49 AM

## 2019-11-28 NOTE — Progress Notes (Signed)
Physical Therapy Treatment Patient Details Name: [name] MRN: [id number] DOB: Jan 29, 1966 Today's Date: 11/28/2019    History of Present Illness 54yo female PMH HTN and recurrent diverticulitis who returned to the ED today with 2 days of worsening abdominal pain.  Patient was admitted 8/1 through 8/9 with complicated diverticulitis and multiple abscesses. CT scan shows significantly increased size of large abscess within the soft tissues of the lower anterior abdominal wall now measuring up to approximately 21 cm, previously 8 cm. Pt is now s/p Open sigmoid colectomy and end colostomy (Hartmann's procedure), small bowel resection, incision and drainage of abdominal wall abscess with debridement of skin and subcutaneous fatty tissue on 8/24.    PT Comments    Pt supine in bed in poor spirits.  Post session she reports," I needed that I feel more uplifted."  Pt tolerated session well with mild dizziness ( low hgb ).  Continue to recommend HHPT.     Follow Up Recommendations  Home health PT;Supervision/Assistance - 24 hour     Equipment Recommendations  None recommended by PT;Other (comment) (TBD)    Recommendations for Other Services       Precautions / Restrictions Precautions Precautions: Fall Restrictions Weight Bearing Restrictions: No    Mobility  Bed Mobility Overal bed mobility: Needs Assistance Bed Mobility: Supine to Sit;Sidelying to Sit;Rolling Rolling: Min guard Sidelying to sit: Min assist       General bed mobility comments: Attempted logroll, ultimately completed as more supine>sit. Assist during powerup of trunk, pt able to move LEs to EOB without assistance  Transfers Overall transfer level: Needs assistance Equipment used: Rolling walker (2 wheeled) Transfers: Sit to/from Stand Sit to Stand: Min guard         General transfer comment: Min assistance to and from commode and edge of bed.  Ambulation/Gait Ambulation/Gait assistance: Min guard Gait  Distance (Feet): 40 Feet Assistive device: Rolling walker (2 wheeled) Gait Pattern/deviations: Step-through pattern;Decreased stride length;Trunk flexed Gait velocity: decr   General Gait Details: Min guard for safety.  NO LOB noted.  Slow and guarded with mild dizziness.   Stairs             Wheelchair Mobility    Modified Rankin (Stroke Patients Only)       Balance Overall balance assessment: Needs assistance   Sitting balance-Leahy Scale: Fair       Standing balance-Leahy Scale: Fair                              Cognition Arousal/Alertness: Awake/alert Behavior During Therapy: Flat affect Overall Cognitive Status: Within Functional Limits for tasks assessed                                        Exercises      General Comments        Pertinent Vitals/Pain Pain Assessment: 0-10 Pain Score: 6  Pain Location: abdomen Pain Descriptors / Indicators: Grimacing;Guarding;Sore Pain Intervention(s): Monitored during session;Repositioned    Home Living                      Prior Function            PT Goals (current goals can now be found in the care plan section) Acute Rehab PT Goals Patient Stated Goal: home, to return to independence Potential  to Achieve Goals: Good Progress towards PT goals: Progressing toward goals    Frequency    Min 3X/week      PT Plan Current plan remains appropriate    Co-evaluation              AM-PAC PT "6 Clicks" Mobility   Outcome Measure  Help needed turning from your back to your side while in a flat bed without using bedrails?: A Little Help needed moving from lying on your back to sitting on the side of a flat bed without using bedrails?: A Little Help needed moving to and from a bed to a chair (including a wheelchair)?: A Little Help needed standing up from a chair using your arms (e.g., wheelchair or bedside chair)?: A Little Help needed to walk in hospital  room?: A Little Help needed climbing 3-5 steps with a railing? : A Little 6 Click Score: 18    End of Session   Activity Tolerance: Patient tolerated treatment well;Patient limited by fatigue Patient left: in bed;with bed alarm set;with call bell/phone within reach Nurse Communication: Mobility status PT Visit Diagnosis: Other abnormalities of gait and mobility (R26.89);Muscle weakness (generalized) (M62.81)     Time: 5916-3846 PT Time Calculation (min) (ACUTE ONLY): 20 min  Charges:  $Gait Training: 8-22 mins                     [name] , PTA Acute Rehabilitation Services Pager [phone] Office [phone]     Siddhi Dornbush [name] 11/28/2019, 5:13 PM

## 2019-11-29 LAB — CBC
HCT: 17.1 % — ABNORMAL LOW (ref 36.0–46.0)
Hemoglobin: 5.7 g/dL — CL (ref 12.0–15.0)
MCH: 23.7 pg — ABNORMAL LOW (ref 26.0–34.0)
MCHC: 33.3 g/dL (ref 30.0–36.0)
MCV: 71 fL — ABNORMAL LOW (ref 80.0–100.0)
Platelets: 413 10*3/uL — ABNORMAL HIGH (ref 150–400)
RBC: 2.41 MIL/uL — ABNORMAL LOW (ref 3.87–5.11)
RDW: 18.9 % — ABNORMAL HIGH (ref 11.5–15.5)
WBC: 23.4 10*3/uL — ABNORMAL HIGH (ref 4.0–10.5)
nRBC: 1.2 % — ABNORMAL HIGH (ref 0.0–0.2)

## 2019-11-29 LAB — BASIC METABOLIC PANEL
Anion gap: 7 (ref 5–15)
BUN: 12 mg/dL (ref 6–20)
CO2: 27 mmol/L (ref 22–32)
Calcium: 8.2 mg/dL — ABNORMAL LOW (ref 8.9–10.3)
Chloride: 105 mmol/L (ref 98–111)
Creatinine, Ser: 0.57 mg/dL (ref 0.44–1.00)
GFR calc Af Amer: >60 mL/min (ref >60)
GFR calc non Af Amer: >60 mL/min (ref >60)
Glucose, Bld: 115 mg/dL — ABNORMAL HIGH (ref 70–99)
Potassium: 3.9 mmol/L (ref 3.5–5.1)
Sodium: 139 mmol/L (ref 135–145)

## 2019-11-29 LAB — PHOSPHORUS: Phosphorus: 3.4 mg/dL (ref 2.5–4.6)

## 2019-11-29 LAB — CULTURE, BLOOD (ROUTINE X 2)
Culture: NO GROWTH
Culture: NO GROWTH

## 2019-11-29 LAB — GLUCOSE, CAPILLARY
Glucose-Capillary: 131 mg/dL — ABNORMAL HIGH (ref 70–99)
Glucose-Capillary: 131 mg/dL — ABNORMAL HIGH (ref 70–99)
Glucose-Capillary: 133 mg/dL — ABNORMAL HIGH (ref 70–99)
Glucose-Capillary: 137 mg/dL — ABNORMAL HIGH (ref 70–99)
Glucose-Capillary: 152 mg/dL — ABNORMAL HIGH (ref 70–99)
Glucose-Capillary: 156 mg/dL — ABNORMAL HIGH (ref 70–99)

## 2019-11-29 MED ORDER — TRAVASOL 10 % IV SOLN
INTRAVENOUS | Status: AC
  Filled 2019-11-29: qty 1299.6

## 2019-11-29 NOTE — Progress Notes (Signed)
Physical Therapy Treatment Patient Details Name: [name] MRN: [id number] DOB: [birth year] Today's Date: 11/29/2019    History of Present Illness 54yo female PMH HTN and recurrent diverticulitis who returned to the ED today with 2 days of worsening abdominal pain.  Patient was admitted 8/1 through 8/9 with complicated diverticulitis and multiple abscesses. CT scan shows significantly increased size of large abscess within the soft tissues of the lower anterior abdominal wall now measuring up to approximately 21 cm, previously 8 cm. Pt is now s/p Open sigmoid colectomy and end colostomy (Hartmann's procedure), small bowel resection, incision and drainage of abdominal wall abscess with debridement of skin and subcutaneous fatty tissue on 8/24.    PT Comments    Pt supine in bed, HGB remains to drop currently 5.7.  Pt reports she has ambulated with nursing staff.  Focused on LE exercises.  Issued HEP for in room use.  Continue to recommend HHPT.     Follow Up Recommendations  Home health PT;Supervision/Assistance - 24 hour     Equipment Recommendations  None recommended by PT;Other (comment) (TBD)    Recommendations for Other Services       Precautions / Restrictions Precautions Precautions: Fall Restrictions Weight Bearing Restrictions: No    Mobility  Bed Mobility Overal bed mobility: Needs Assistance             General bed mobility comments: min guard to assist with positioning in bed.  Transfers Overall transfer level:  (deferred OOB as she has walked x 1 with NT  and HGB is 5.7)                  Ambulation/Gait                 Stairs             Wheelchair Mobility    Modified Rankin (Stroke Patients Only)       Balance                                            Cognition Arousal/Alertness: Awake/alert Behavior During Therapy: Flat affect Overall Cognitive Status: Within Functional Limits for tasks assessed                                         Exercises General Exercises - Lower Extremity Ankle Circles/Pumps: AROM;Both;Supine;20 reps Quad Sets: AROM;Both;Supine Heel Slides: AROM;Both;10 reps;Supine Hip ABduction/ADduction: AROM;Both;10 reps;Supine Straight Leg Raises: AROM;Both;10 reps;Supine    General Comments        Pertinent Vitals/Pain Pain Assessment: 0-10 Faces Pain Scale: Hurts little more Pain Location: abdomen Pain Descriptors / Indicators: Grimacing;Guarding;Sore Pain Intervention(s): Monitored during session    Home Living                      Prior Function            PT Goals (current goals can now be found in the care plan section) Acute Rehab PT Goals Patient Stated Goal: home, to return to independence Potential to Achieve Goals: Good Progress towards PT goals: Progressing toward goals    Frequency    Min 3X/week      PT Plan Current plan remains appropriate    Co-evaluation  AM-PAC PT "6 Clicks" Mobility   Outcome Measure  Help needed turning from your back to your side while in a flat bed without using bedrails?: A Little Help needed moving from lying on your back to sitting on the side of a flat bed without using bedrails?: A Little Help needed moving to and from a bed to a chair (including a wheelchair)?: A Little Help needed standing up from a chair using your arms (e.g., wheelchair or bedside chair)?: A Little Help needed to walk in hospital room?: A Little Help needed climbing 3-5 steps with a railing? : A Little 6 Click Score: 18    End of Session Equipment Utilized During Treatment: Gait belt Activity Tolerance: Patient tolerated treatment well;Patient limited by fatigue Patient left: in bed;with bed alarm set;with call bell/phone within reach Nurse Communication: Mobility status PT Visit Diagnosis: Other abnormalities of gait and mobility (R26.89);Muscle weakness (generalized) (M62.81)      Time: 8657-8469 PT Time Calculation (min) (ACUTE ONLY): 24 min  Charges:  $Therapeutic Exercise: 23-37 mins                     [name] , PTA Acute Rehabilitation Services Pager [phone] Office [phone]     Heber Hoog [name] 11/29/2019, 5:24 PM

## 2019-11-29 NOTE — Progress Notes (Signed)
Patient ID: [name], female   DOB: September 14, 1965, 54 y.o.   MRN: [id number] [facility] Progress Note:   4 Days Post-Op  Subjective: Mental status is clear.  Complaints none. Objective: Vital signs in last 24 hours: Temp:  [98.6 F (37 C)-99.3 F (37.4 C)] 98.8 F (37.1 C) (08/28 0448) Pulse Rate:  [80-94] 89 (08/28 0448) Resp:  [16-18] 16 (08/28 0448) BP: (109-117)/(68-72) 117/70 (08/28 0448) SpO2:  [100 %] 100 % (08/28 0448)  Intake/Output from previous day: 08/27 0701 - 08/28 0700 In: 910 [P.O.:900; I.V.:10] Out: -  Intake/Output this shift: No intake/output data recorded.  Physical Exam: Work of breathing is normal;  TNA in progress; Ostomy with output  Lab Results:  Results for orders placed or performed during the hospital encounter of 11/24/19 (from the past 48 hour(s))  CBC with Differential/Platelet     Status: Abnormal   Collection Time: 11/27/19 11:47 AM  Result Value Ref Range   WBC 28.1 (H) 4.0 - 10.5 K/uL   RBC 2.36 (L) 3.87 - 5.11 MIL/uL   Hemoglobin 5.7 (LL) 12.0 - 15.0 g/dL    Comment: REPEATED TO VERIFY Reticulocyte Hemoglobin testing may be clinically indicated, consider ordering this additional test [id number] THIS CRITICAL RESULT HAS VERIFIED AND BEEN CALLED TO AMANDA KEDDIE,RN BY ZELDA BEECH ON 08 26 2021 AT 1233, AND HAS BEEN READ BACK.     HCT 16.8 (L) 36 - 46 %   MCV 71.2 (L) 80.0 - 100.0 fL   MCH 24.2 (L) 26.0 - 34.0 pg   MCHC 33.9 30.0 - 36.0 g/dL   RDW 15.7 (H) 26.2 - 03.5 %   Platelets 322 150 - 400 K/uL   nRBC 0.2 0.0 - 0.2 %   Neutrophils Relative % 73 %   Neutro Abs 20.3 (H) 1.7 - 7.7 K/uL   Lymphocytes Relative 9 %   Lymphs Abs 2.4 0.7 - 4.0 K/uL   Monocytes Relative 6 %   Monocytes Absolute 1.7 (H) 0 - 1 K/uL   Eosinophils Relative 0 %   Eosinophils Absolute 0.0 0 - 0 K/uL   Basophils Relative 0 %   Basophils Absolute 0.1 0 - 0 K/uL   WBC Morphology INCREASED BANDS (>20% BANDS)     Comment: MILD LEFT SHIFT (1-5%  METAS, OCC MYELO, OCC BANDS)   Immature Granulocytes 12 %   Abs Immature Granulocytes 3.49 (H) 0.00 - 0.07 K/uL   Target Cells PRESENT     Comment: Performed at [facility], [address], [place], [place] [address]  Glucose, capillary     Status: Abnormal   Collection Time: 11/27/19 12:09 PM  Result Value Ref Range   Glucose-Capillary 156 (H) 70 - 99 mg/dL    Comment: Glucose reference range applies only to samples taken after fasting for at least 8 hours.  Glucose, capillary     Status: Abnormal   Collection Time: 11/27/19  4:08 PM  Result Value Ref Range   Glucose-Capillary 170 (H) 70 - 99 mg/dL    Comment: Glucose reference range applies only to samples taken after fasting for at least 8 hours.  Glucose, capillary     Status: Abnormal   Collection Time: 11/27/19  8:04 PM  Result Value Ref Range   Glucose-Capillary 122 (H) 70 - 99 mg/dL    Comment: Glucose reference range applies only to samples taken after fasting for at least 8 hours.  Glucose, capillary     Status: Abnormal   Collection  Time: 11/27/19 11:46 PM  Result Value Ref Range   Glucose-Capillary 126 (H) 70 - 99 mg/dL    Comment: Glucose reference range applies only to samples taken after fasting for at least 8 hours.  Glucose, capillary     Status: Abnormal   Collection Time: 11/28/19  3:04 AM  Result Value Ref Range   Glucose-Capillary 128 (H) 70 - 99 mg/dL    Comment: Glucose reference range applies only to samples taken after fasting for at least 8 hours.  Basic metabolic panel     Status: Abnormal   Collection Time: 11/28/19  3:52 AM  Result Value Ref Range   Sodium 144 135 - 145 mmol/L   Potassium 3.1 (L) 3.5 - 5.1 mmol/L   Chloride 106 98 - 111 mmol/L   CO2 29 22 - 32 mmol/L   Glucose, Bld 135 (H) 70 - 99 mg/dL    Comment: Glucose reference range applies only to samples taken after fasting for at least 8 hours.   BUN 11 6 - 20 mg/dL   Creatinine, Ser 5.59 0.44 - 1.00 mg/dL   Calcium 8.2 (L) 8.9 -  10.3 mg/dL   GFR calc non Af Amer >60 >60 mL/min   GFR calc Af Amer >60 >60 mL/min   Anion gap 9 5 - 15    Comment: Performed at [facility], [address], [place], [place] [address]  Magnesium     Status: None   Collection Time: 11/28/19  3:52 AM  Result Value Ref Range   Magnesium 2.0 1.7 - 2.4 mg/dL    Comment: Performed at [facility], [address], [place], [place] [address]  Phosphorus     Status: None   Collection Time: 11/28/19  3:52 AM  Result Value Ref Range   Phosphorus 2.9 2.5 - 4.6 mg/dL    Comment: Performed at [facility], [address] [place], [place] [address]  CBC     Status: Abnormal   Collection Time: 11/28/19  3:52 AM  Result Value Ref Range   WBC 22.8 (H) 4.0 - 10.5 K/uL   RBC 2.68 (L) 3.87 - 5.11 MIL/uL   Hemoglobin 6.5 (LL) 12.0 - 15.0 g/dL    Comment: REPEATED TO VERIFY CRITICAL VALUE NOTED.  VALUE IS CONSISTENT WITH PREVIOUSLY REPORTED AND CALLED VALUE. Reticulocyte Hemoglobin testing may be clinically indicated, consider ordering this additional test [id number]    HCT 19.2 (L) 36 - 46 %   MCV 71.6 (L) 80.0 - 100.0 fL   MCH 24.3 (L) 26.0 - 34.0 pg   MCHC 33.9 30.0 - 36.0 g/dL   RDW 00.3 (H) 70.4 - 88.8 %   Platelets 431 (H) 150 - 400 K/uL   nRBC 0.8 (H) 0.0 - 0.2 %    Comment: Performed at [facility], [address] [place], [place] [address]  Glucose, capillary     Status: Abnormal   Collection Time: 11/28/19  7:44 AM  Result Value Ref Range   Glucose-Capillary 169 (H) 70 - 99 mg/dL    Comment: Glucose reference range applies only to samples taken after fasting for at least 8 hours.  Glucose, capillary     Status: Abnormal   Collection Time: 11/28/19 11:46 AM  Result Value Ref Range   Glucose-Capillary 165 (H) 70 - 99 mg/dL    Comment: Glucose reference range applies only to samples taken after fasting for at least 8 hours.  Glucose, capillary  Status: Abnormal   Collection Time: 11/28/19  4:28 PM  Result  Value Ref Range   Glucose-Capillary 163 (H) 70 - 99 mg/dL    Comment: Glucose reference range applies only to samples taken after fasting for at least 8 hours.  Glucose, capillary     Status: Abnormal   Collection Time: 11/28/19  7:40 PM  Result Value Ref Range   Glucose-Capillary 152 (H) 70 - 99 mg/dL    Comment: Glucose reference range applies only to samples taken after fasting for at least 8 hours.  Glucose, capillary     Status: Abnormal   Collection Time: 11/28/19  9:37 PM  Result Value Ref Range   Glucose-Capillary 163 (H) 70 - 99 mg/dL    Comment: Glucose reference range applies only to samples taken after fasting for at least 8 hours.  Glucose, capillary     Status: Abnormal   Collection Time: 11/29/19 12:13 AM  Result Value Ref Range   Glucose-Capillary 137 (H) 70 - 99 mg/dL    Comment: Glucose reference range applies only to samples taken after fasting for at least 8 hours.  CBC     Status: Abnormal   Collection Time: 11/29/19  3:19 AM  Result Value Ref Range   WBC 23.4 (H) 4.0 - 10.5 K/uL   RBC 2.41 (L) 3.87 - 5.11 MIL/uL   Hemoglobin 5.7 (LL) 12.0 - 15.0 g/dL    Comment: REPEATED TO VERIFY CRITICAL VALUE NOTED.  VALUE IS CONSISTENT WITH PREVIOUSLY REPORTED AND CALLED VALUE. Reticulocyte Hemoglobin testing may be clinically indicated, consider ordering this additional test [id number]    HCT 17.1 (L) 36 - 46 %   MCV 71.0 (L) 80.0 - 100.0 fL   MCH 23.7 (L) 26.0 - 34.0 pg   MCHC 33.3 30.0 - 36.0 g/dL   RDW 67.0 (H) 14.1 - 03.0 %   Platelets 413 (H) 150 - 400 K/uL   nRBC 1.2 (H) 0.0 - 0.2 %    Comment: Performed at [facility], [address], [place], [place] [address]  Basic metabolic panel     Status: Abnormal   Collection Time: 11/29/19  3:19 AM  Result Value Ref Range   Sodium 139 135 - 145 mmol/L   Potassium 3.9 3.5 - 5.1 mmol/L   Chloride 105 98 - 111 mmol/L   CO2 27 22 - 32 mmol/L   Glucose, Bld 115 (H) 70 - 99 mg/dL    Comment: Glucose reference  range applies only to samples taken after fasting for at least 8 hours.   BUN 12 6 - 20 mg/dL   Creatinine, Ser 8.88 0.44 - 1.00 mg/dL   Calcium 8.2 (L) 8.9 - 10.3 mg/dL   GFR calc non Af Amer >60 >60 mL/min   GFR calc Af Amer >60 >60 mL/min   Anion gap 7 5 - 15    Comment: Performed at [facility], [address], [place], [place] [address]  Phosphorus     Status: None   Collection Time: 11/29/19  3:19 AM  Result Value Ref Range   Phosphorus 3.4 2.5 - 4.6 mg/dL    Comment: Performed at [facility], [address] [place], [place] [address]  Glucose, capillary     Status: Abnormal   Collection Time: 11/29/19  4:51 AM  Result Value Ref Range   Glucose-Capillary 152 (H) 70 - 99 mg/dL    Comment: Glucose reference range applies only to samples taken after fasting  for at least 8 hours.  Glucose, capillary     Status: Abnormal   Collection Time: 11/29/19  7:54 AM  Result Value Ref Range   Glucose-Capillary 131 (H) 70 - 99 mg/dL    Comment: Glucose reference range applies only to samples taken after fasting for at least 8 hours.    Radiology/Results: No results found.  Anti-infectives: Anti-infectives (From admission, onward)   Start     Dose/Rate Route Frequency Ordered Stop   11/25/19 1700  fluconazole (DIFLUCAN) IVPB 400 mg        400 mg 100 mL/hr over 120 Minutes Intravenous Every 24 hours 11/25/19 1649 11/30/19 1759   11/24/19 2000  vancomycin (VANCOCIN) IVPB 1000 mg/200 mL premix  Status:  Discontinued        1,000 mg 200 mL/hr over 60 Minutes Intravenous Every 8 hours 11/24/19 1111 11/25/19 1649   11/24/19 1400  piperacillin-tazobactam (ZOSYN) IVPB 3.375 g        3.375 g 12.5 mL/hr over 240 Minutes Intravenous Every 8 hours 11/24/19 1355 11/29/19 2359   11/24/19 1015  ceFEPIme (MAXIPIME) 2 g in sodium chloride 0.9 % 100 mL IVPB        2 g 200 mL/hr over 30 Minutes Intravenous  Once 11/24/19 1003 11/24/19 1130   11/24/19 1015  metroNIDAZOLE (FLAGYL) IVPB 500  mg        500 mg 100 mL/hr over 60 Minutes Intravenous  Once 11/24/19 1003 11/24/19 1400   11/24/19 0945  vancomycin (VANCOCIN) IVPB 1000 mg/200 mL premix  Status:  Discontinued        1,000 mg 200 mL/hr over 60 Minutes Intravenous  Once 11/24/19 0931 11/24/19 0939   11/24/19 0945  vancomycin (VANCOREADY) IVPB 1500 mg/300 mL        1,500 mg 150 mL/hr over 120 Minutes Intravenous  Once 11/24/19 1950 11/24/19 1418      Assessment/Plan: Problem List: Patient Active Problem List   Diagnosis Date Noted  . Protein-calorie malnutrition, severe 11/27/2019  . Diverticulitis of large intestine with abscess 11/14/2019  . Essential hypertension 11/14/2019  . Anemia, chronic disease 11/14/2019  . Severe sepsis (HCC) 11/06/2019  . Diverticulitis 11/02/2019  . Intra-abdominal abscess (HCC) 11/02/2019    JW with Hg 5.7 and TNA in progress and taking some po.  Continue observation.   4 Days Post-Op    LOS: 5 days   Matt B. Daphine Deutscher, MD, [facility] [phone] to reach the surgeon on call.    11/29/2019 10:11 AM

## 2019-11-29 NOTE — Progress Notes (Signed)
PHARMACY - TOTAL PARENTERAL NUTRITION CONSULT NOTE  Indication: Intolerance to enteral feeding  Patient Measurements: Height: 5' 6.5" (168.9 cm) Weight: 90 kg (198 lb 6.6 oz) IBW/kg (Calculated) : 60.44 TPN AdjBW (KG): 67.8 Body mass index is 31.55 kg/m.  Assessment:  24 YOF with recurrent diverticulitis who presented with worsening abdominal pain. She was recently admitted on 8/1 through 8/9 with complicated diverticulitis and multiple abscesses and underwent percutaneous drain x2 placement by IR on 8/3. CT abd on 8/23 showed numerous fistulous connections between the sigmoid colon and abscess cavity.  Planning ex-lap, partial colectomy, colostomy and I&D of abdominal wall abscess, so Pharmacy consulted to manage TPN due to poor enteral nutrition in setting of severe malnutrition.  Glucose / Insulin: A1c 5.5% - CBGs well controlled.  Used 26 units mSSI in past 24 hrs. Electrolytes: all WNL Renal: SCr < 1, BUN WNL LFTs / TGs:  LFTs / tbili / TG WNL  Prealbumin / albumin: BL prealbumin <5, albumin 1.7 Intake / Output; MIVF: urinated x6 GI Imaging: none since TPN Surgeries / Procedures:  8/24: open sigmoid colectomy and end colostomy, SBR, I&D of abdominal wall abscess.   Central access: PICC placed 11/26/19 TPN start date: 11/26/19   Nutritional Goals (per RD rec on 8/25): 2100-2300 kCal, 125-135g protein, >2.1L fluid per day  Current Nutrition:  TPN Ensure Surgery BID (1 charted given) Full liquid diet started 8/27  Plan:  Continue TPN at goal rate 95 ml/hr, providing 130g AA, 296g CHO and 66g ILE for a total of 2190 kCal, meeting 100% of patient needs Electrolytes in TPN: Na 97mEq/L, K 76mEq/L (= 114 mEq/d), Ca 72mEq/L, Mag 53mEq/L, Phos 66mmol/L, Cl:Ac 1:1 - no change today Add standard MVI and trace elements to TPN Continue moderate SSI Q4H BMET in AM  Oral Remache D. Laney Potash, PharmD, BCPS, BCCCP 11/29/2019, 10:16 AM

## 2019-11-30 LAB — BASIC METABOLIC PANEL
Anion gap: 5 (ref 5–15)
BUN: 14 mg/dL (ref 6–20)
CO2: 26 mmol/L (ref 22–32)
Calcium: 8.4 mg/dL — ABNORMAL LOW (ref 8.9–10.3)
Chloride: 102 mmol/L (ref 98–111)
Creatinine, Ser: 0.54 mg/dL (ref 0.44–1.00)
GFR calc Af Amer: >60 mL/min (ref >60)
GFR calc non Af Amer: >60 mL/min (ref >60)
Glucose, Bld: 122 mg/dL — ABNORMAL HIGH (ref 70–99)
Potassium: 4.3 mmol/L (ref 3.5–5.1)
Sodium: 133 mmol/L — ABNORMAL LOW (ref 135–145)

## 2019-11-30 LAB — GLUCOSE, CAPILLARY
Glucose-Capillary: 128 mg/dL — ABNORMAL HIGH (ref 70–99)
Glucose-Capillary: 138 mg/dL — ABNORMAL HIGH (ref 70–99)
Glucose-Capillary: 144 mg/dL — ABNORMAL HIGH (ref 70–99)
Glucose-Capillary: 144 mg/dL — ABNORMAL HIGH (ref 70–99)
Glucose-Capillary: 148 mg/dL — ABNORMAL HIGH (ref 70–99)
Glucose-Capillary: 165 mg/dL — ABNORMAL HIGH (ref 70–99)

## 2019-11-30 MED ORDER — TRAVASOL 10 % IV SOLN
INTRAVENOUS | Status: AC
  Filled 2019-11-30: qty 1299.6

## 2019-11-30 NOTE — Plan of Care (Signed)

## 2019-11-30 NOTE — Progress Notes (Signed)
PHARMACY - TOTAL PARENTERAL NUTRITION CONSULT NOTE  Indication: Intolerance to enteral feeding  Patient Measurements: Height: 5' 6.5" (168.9 cm) Weight: 90 kg (198 lb 6.6 oz) IBW/kg (Calculated) : 60.44 TPN AdjBW (KG): 67.8 Body mass index is 31.55 kg/m.  Assessment:  59 YOF with recurrent diverticulitis who presented with worsening abdominal pain. She was recently admitted on 8/1 through 8/9 with complicated diverticulitis and multiple abscesses and underwent percutaneous drain x2 placement by IR on 8/3. CT abd on 8/23 showed numerous fistulous connections between the sigmoid colon and abscess cavity.  Planning ex-lap, partial colectomy, colostomy and I&D of abdominal wall abscess, so Pharmacy consulted to manage TPN due to poor enteral nutrition in setting of severe malnutrition.  Glucose / Insulin: A1c 5.5% - CBGs well controlled.  Used 14 units mSSI in past 24 hrs. Electrolytes: low Na, K up to 4.3, others WNL Renal: SCr < 1, BUN WNL LFTs / TGs:  LFTs / tbili / TG WNL  Prealbumin / albumin: BL prealbumin <5, albumin 1.7 Intake / Output; MIVF: UOP 0.6 ml/kg/hr GI Imaging: none since TPN Surgeries / Procedures:  8/24: open sigmoid colectomy and end colostomy, SBR, I&D of abdominal wall abscess.   Central access: PICC placed 11/26/19 TPN start date: 11/26/19   Nutritional Goals (per RD rec on 8/25): 2100-2300 kCal, 125-135g protein, >2.1L fluid per day  Current Nutrition:  TPN Ensure Surgery BID (1 charted given) Advance to soft diet 8/29  Plan:  Continue TPN at goal rate 95 ml/hr, providing 130g AA, 296g CHO and 66g ILE for a total of 2190 kCal, meeting 100% of patient needs Electrolytes in TPN: incr Na 66mEq/L, decr K slightly to 31mEq/L, Ca 29mEq/L, Mag 79mEq/L, Phos 54mmol/L, Cl:Ac 1:1 Add standard MVI and trace elements to TPN Continue moderate SSI Q4H Standard TPN labs on Mon and Thurs, wean TPN tomorrow if tolerates soft diet per Surgery  Blase Beckner D. Laney Potash, PharmD, BCPS,  BCCCP 11/30/2019, 10:03 AM

## 2019-11-30 NOTE — Progress Notes (Addendum)
5 Days Post-Op  Subjective: CC: Doing well. Less pain. More epigastric and left side of abdomen. Worse after working with PT. Tolerating fld without n/v or increased abdominal pain. Mobilizing. Having colostomy output.   Objective: Vital signs in last 24 hours: Temp:  [98.1 F (36.7 C)-98.7 F (37.1 C)] 98.1 F (36.7 C) (08/29 0412) Pulse Rate:  [92-98] 92 (08/29 0412) Resp:  [18] 18 (08/29 0026) BP: (101-117)/(72-83) 101/72 (08/29 0412) SpO2:  [99 %-100 %] 99 % (08/29 0412) Last BM Date: 11/29/19  Intake/Output from previous day: 08/28 0701 - 08/29 0700 In: 2271.5 [P.O.:1080; I.V.:1191.5] Out: 1550 [Urine:1400] Intake/Output this shift: No intake/output data recorded.  PE: Gen: Alert, NAD, pleasant HEENT: EOM's intact, pupils equal and round Card: RRR Pulm: CTAB, no W/R/R, effort normal. Off o2 Abd: Soft,mild distension, there is tenderness of theleft abdomenand epigastrium. No peritonitis. +BS . Colostomy in place.. There is some brown stool in the bag without any air. Unable to see stoma passed stool. Midline wound with vac in place with thick, bloody/milky output with sediment in cannister.  Ext: No LE edema Psych: A&Ox3  Skin:Wound as noted above. Otherwiseno rashes noted, warm and dry  Lab Results:  Recent Labs    11/28/19 0352 11/29/19 0319  WBC 22.8* 23.4*  HGB 6.5* 5.7*  HCT 19.2* 17.1*  PLT 431* 413*   BMET Recent Labs    11/29/19 0319 11/30/19 0305  NA 139 133*  K 3.9 4.3  CL 105 102  CO2 27 26  GLUCOSE 115* 122*  BUN 12 14  CREATININE 0.57 0.54  CALCIUM 8.2* 8.4*   PT/INR No results for input(s): LABPROT, INR in the last 72 hours. CMP     Component Value Date/Time   NA 133 (L) 11/30/2019 0305   K 4.3 11/30/2019 0305   CL 102 11/30/2019 0305   CO2 26 11/30/2019 0305   GLUCOSE 122 (H) 11/30/2019 0305   BUN 14 11/30/2019 0305   CREATININE 0.54 11/30/2019 0305   CALCIUM 8.4 (L) 11/30/2019 0305   PROT 4.8 (L) 11/27/2019  0433   ALBUMIN 1.7 (L) 11/27/2019 0433   AST 13 (L) 11/27/2019 0433   ALT 9 11/27/2019 0433   ALKPHOS 45 11/27/2019 0433   BILITOT 0.5 11/27/2019 0433   GFRNONAA >60 11/30/2019 0305   GFRAA >60 11/30/2019 0305   Lipase  No results found for: LIPASE     Studies/Results: No results found.  Anti-infectives: Anti-infectives (From admission, onward)   Start     Dose/Rate Route Frequency Ordered Stop   11/25/19 1700  fluconazole (DIFLUCAN) IVPB 400 mg        400 mg 100 mL/hr over 120 Minutes Intravenous Every 24 hours 11/25/19 1649 11/29/19 2356   11/24/19 2000  vancomycin (VANCOCIN) IVPB 1000 mg/200 mL premix  Status:  Discontinued        1,000 mg 200 mL/hr over 60 Minutes Intravenous Every 8 hours 11/24/19 1111 11/25/19 1649   11/24/19 1400  piperacillin-tazobactam (ZOSYN) IVPB 3.375 g        3.375 g 12.5 mL/hr over 240 Minutes Intravenous Every 8 hours 11/24/19 1355 11/29/19 2030   11/24/19 1015  ceFEPIme (MAXIPIME) 2 g in sodium chloride 0.9 % 100 mL IVPB        2 g 200 mL/hr over 30 Minutes Intravenous  Once 11/24/19 1003 11/24/19 1130   11/24/19 1015  metroNIDAZOLE (FLAGYL) IVPB 500 mg        500 mg 100 mL/hr over 60  Minutes Intravenous  Once 11/24/19 1003 11/24/19 1400   11/24/19 0945  vancomycin (VANCOCIN) IVPB 1000 mg/200 mL premix  Status:  Discontinued        1,000 mg 200 mL/hr over 60 Minutes Intravenous  Once 11/24/19 0931 11/24/19 0939   11/24/19 0945  vancomycin (VANCOREADY) IVPB 1500 mg/300 mL        1,500 mg 150 mL/hr over 120 Minutes Intravenous  Once 11/24/19 0939 11/24/19 1418       Assessment/Plan HTN - home med ABLAnemia - Hgb7.5> 5.7 > 6.5 > 5.7 (8/28). Patient states that due to religious beliefs she would still not want any blood products and understands the risks. Pediatric tube blood draws only to limit loss. We will hold off on daily blood draws to limit loss.  Code status - DNR/DNI. Note from 8/26 "We discussed that given patients dropping  hgb, she is at increased risk for decompensation that could lead to a cardiac event or death. We discussed code status in detail. Patient wishes to be DNR/DNI. We filled out a MOST form that was signed by the patient and placed in her paper chart as well as scanned into the media section. Her code status was changed on electronic records as well." Malnutrition - prealbumin <5, TPN  Perforated diverticulitis with w/feculent peritonitisand abdominal wall abscess - s/p open sigmoid colectomy and end colostomy (Hartmann's procedure), small bowel resection, incision and drainage of abdominal wall abscess with debridement of skin and subcutaneous fatty tissueby Dr. Donell Beers 8/24 - POD #5 - Completed abx -Adv to soft. Cont TPN. If tolerates soft, will start weaning TPN tomorrow.  - WOCN following for new ostomy and wound vac. Plans for m/w/f changes. Will see tomorrow. - Monitor vac output. Discuss with MD - Mobilize. PT/OT. Recommending HH - Pulm toilet, IS - Path without evidence of malignancy. As noted below  ID -[email or web address].Zosyn 8/23 - 8/28.Diflucan 8/24 -8/28  VTE -SCDs,hold lovenox 2/2 anemia  FEN - Soft, ensure, TPN  Foley -Dc'd 8/26 Follow up -Dr. Donell Beers  Path A. COLON, SIGMOID, RESECTION:  - Segment of colon (25 cm) showing diverticulosis, diverticulitis with  associated abscesses, serositis and serosal adhesions  - Serosal adhesions to portion of benign ovary  - Endometriosis  - Margins appear viable   B. SMALL BOWEL, SEGMENT OF ILEUM, RESECTION:  - Segment of small intestine (11.5 cm) with serositis and serosal  adhesions  - Margins appear viable     LOS: 6 days    [name] , [facility] Surgery 11/30/2019, 9:15 AM Please see Amion for pager number during day hours 7:00am-4:30pm

## 2019-12-01 ENCOUNTER — Inpatient Hospital Stay (HOSPITAL_COMMUNITY): Payer: Commercial Managed Care - PPO

## 2019-12-01 LAB — CBC
HCT: 19.2 % — ABNORMAL LOW (ref 36.0–46.0)
Hemoglobin: 6.4 g/dL — CL (ref 12.0–15.0)
MCH: 24.4 pg — ABNORMAL LOW (ref 26.0–34.0)
MCHC: 33.3 g/dL (ref 30.0–36.0)
MCV: 73.3 fL — ABNORMAL LOW (ref 80.0–100.0)
Platelets: 519 10*3/uL — ABNORMAL HIGH (ref 150–400)
RBC: 2.62 MIL/uL — ABNORMAL LOW (ref 3.87–5.11)
RDW: 20.1 % — ABNORMAL HIGH (ref 11.5–15.5)
WBC: 26.6 10*3/uL — ABNORMAL HIGH (ref 4.0–10.5)
nRBC: 0.5 % — ABNORMAL HIGH (ref 0.0–0.2)

## 2019-12-01 LAB — COMPREHENSIVE METABOLIC PANEL
ALT: 11 U/L (ref 0–44)
AST: 15 U/L (ref 15–41)
Albumin: 2.1 g/dL — ABNORMAL LOW (ref 3.5–5.0)
Alkaline Phosphatase: 67 U/L (ref 38–126)
Anion gap: 7 (ref 5–15)
BUN: 13 mg/dL (ref 6–20)
CO2: 26 mmol/L (ref 22–32)
Calcium: 8.6 mg/dL — ABNORMAL LOW (ref 8.9–10.3)
Chloride: 101 mmol/L (ref 98–111)
Creatinine, Ser: 0.52 mg/dL (ref 0.44–1.00)
GFR calc Af Amer: >60 mL/min (ref >60)
GFR calc non Af Amer: >60 mL/min (ref >60)
Glucose, Bld: 134 mg/dL — ABNORMAL HIGH (ref 70–99)
Potassium: 4.1 mmol/L (ref 3.5–5.1)
Sodium: 134 mmol/L — ABNORMAL LOW (ref 135–145)
Total Bilirubin: 0.5 mg/dL (ref 0.3–1.2)
Total Protein: 5.5 g/dL — ABNORMAL LOW (ref 6.5–8.1)

## 2019-12-01 LAB — GLUCOSE, CAPILLARY
Glucose-Capillary: 117 mg/dL — ABNORMAL HIGH (ref 70–99)
Glucose-Capillary: 121 mg/dL — ABNORMAL HIGH (ref 70–99)
Glucose-Capillary: 139 mg/dL — ABNORMAL HIGH (ref 70–99)
Glucose-Capillary: 143 mg/dL — ABNORMAL HIGH (ref 70–99)
Glucose-Capillary: 152 mg/dL — ABNORMAL HIGH (ref 70–99)
Glucose-Capillary: 154 mg/dL — ABNORMAL HIGH (ref 70–99)

## 2019-12-01 LAB — DIFFERENTIAL
Abs Immature Granulocytes: 3.25 10*3/uL — ABNORMAL HIGH (ref 0.00–0.07)
Basophils Absolute: 0.1 10*3/uL (ref 0.0–0.1)
Basophils Relative: 0 %
Eosinophils Absolute: 0.5 10*3/uL (ref 0.0–0.5)
Eosinophils Relative: 2 %
Immature Granulocytes: 12 %
Lymphocytes Relative: 14 %
Lymphs Abs: 3.8 10*3/uL (ref 0.7–4.0)
Monocytes Absolute: 1.2 10*3/uL — ABNORMAL HIGH (ref 0.1–1.0)
Monocytes Relative: 4 %
Neutro Abs: 17.8 10*3/uL — ABNORMAL HIGH (ref 1.7–7.7)
Neutrophils Relative %: 68 %

## 2019-12-01 LAB — PHOSPHORUS: Phosphorus: 3.6 mg/dL (ref 2.5–4.6)

## 2019-12-01 LAB — TRIGLYCERIDES: Triglycerides: 200 mg/dL — ABNORMAL HIGH (ref <150)

## 2019-12-01 LAB — PREALBUMIN: Prealbumin: 25.3 mg/dL (ref 18–38)

## 2019-12-01 LAB — MAGNESIUM: Magnesium: 1.8 mg/dL (ref 1.7–2.4)

## 2019-12-01 MED ORDER — MAGNESIUM SULFATE IN D5W 1-5 GM/100ML-% IV SOLN
1.0000 g | Freq: Once | INTRAVENOUS | Status: AC
  Administered 2019-12-01: 1 g via INTRAVENOUS
  Filled 2019-12-01: qty 100

## 2019-12-01 MED ORDER — SODIUM CHLORIDE 0.9 % IV SOLN
510.0000 mg | Freq: Once | INTRAVENOUS | Status: AC
  Administered 2019-12-02: 510 mg via INTRAVENOUS
  Filled 2019-12-01: qty 17

## 2019-12-01 MED ORDER — IOHEXOL 9 MG/ML PO SOLN
500.0000 mL | ORAL | Status: AC
  Administered 2019-12-01 (×2): 500 mL via ORAL

## 2019-12-01 MED ORDER — TRAVASOL 10 % IV SOLN
INTRAVENOUS | Status: AC
  Filled 2019-12-01: qty 637.2

## 2019-12-01 MED ORDER — IOHEXOL 300 MG/ML  SOLN
100.0000 mL | Freq: Once | INTRAMUSCULAR | Status: AC | PRN
  Administered 2019-12-01: 100 mL via INTRAVENOUS

## 2019-12-01 NOTE — Consult Note (Signed)
WOC Nurse wound follow up Wound type:midline abdominal with NPWT (VAC) dressing and LUQ end colostomy Measurement: 16 cm x 15 cm x 4.5 cm with tunneling at distal end, 5 and 7 o'clock (left 6 cm and right 5 cm ) Must ensure depth is packed.  Wound ANV:BTYOMA adipose, blue suture noted in deepest aspect of wound bed.  Drainage (amount, consistency, odor) moderate serosanguinous no odor.  Periwound:LUQ colostomy Dressing procedure/placement/frequency: Cleanse with NS.  Apply 3 pieces black foam to fill in dead space in distal tunneling and proximal end.  Cover with drape. Seal immediately achieved.  Change Mon/Wed/Fri.  Will follow.   WOC Nurse ostomy follow up Stoma type/location: LUQ colostomy  Stomal assessment/size: 1 3/4" pink, patent and producing brown stool Peristomal assessment: creasing at 3 and 9 o'clock.  Will add barrier ring  And 1 piece convex pouch.  Treatment options for stomal/peristomal skin: barrier ring Output soft brown stool  Ostomy pouching: 1pc.convex with barrier ring.  Education provided: Patient lives with someone but they are out of town and will not be in until the weekend, she is agreable to begin learning ostomy care.  Has scan today and is drinking prep, does not wish to do right now.    Enrolled patient in [place] [employer] Discharge program: No  Maple Hudson RN, FNP-BC CWON Wound, Ostomy, Continence Nurse Pager [phone]

## 2019-12-01 NOTE — Progress Notes (Addendum)
6 Days Post-Op  Subjective: CC: Doing well. Tolerating diet without increased abdominal pain, n/v. Having bowel function. Pain in epigastrium and left side of abdomen has improved. Mobilizing well.   Objective: Vital signs in last 24 hours: Temp:  [98 F (36.7 C)-98.5 F (36.9 C)] 98 F (36.7 C) (08/30 0425) Pulse Rate:  [84-86] 86 (08/30 0425) Resp:  [18] 18 (08/30 0425) BP: (115-116)/(71-73) 115/73 (08/30 0425) SpO2:  [99 %] 99 % (08/30 0425) Last BM Date: 11/29/19  Intake/Output from previous day: 08/29 0701 - 08/30 0700 In: -  Out: 1500 [Urine:1500] Intake/Output this shift: No intake/output data recorded.  PE: Gen: Alert, NAD, pleasant HEENT: EOM's intact, pupils equal and round Card: RRR Pulm: CTAB, no W/R/R, effort normal. Off o2 Abd: Soft,mild distension, there is tenderness of theleftabdomenand epigastrium that is improved since yesterday.No peritonitis.+BS. Colostomy in place. There is somebrown stool in the bag without any air.Unable to see stoma passed stool. Midline wound with vac in place with thick, bloody/purulent output with sediment in cannister. Ext: No LE edema Psych: A&Ox3  Skin:Wound as noted above. Otherwiseno rashes noted, warm and dry  Lab Results:  Recent Labs    11/29/19 0319 12/01/19 0320  WBC 23.4* 26.6*  HGB 5.7* 6.4*  HCT 17.1* 19.2*  PLT 413* 519*   BMET Recent Labs    11/30/19 0305 12/01/19 0320  NA 133* 134*  K 4.3 4.1  CL 102 101  CO2 26 26  GLUCOSE 122* 134*  BUN 14 13  CREATININE 0.54 0.52  CALCIUM 8.4* 8.6*   PT/INR No results for input(s): LABPROT, INR in the last 72 hours. CMP     Component Value Date/Time   NA 134 (L) 12/01/2019 0320   K 4.1 12/01/2019 0320   CL 101 12/01/2019 0320   CO2 26 12/01/2019 0320   GLUCOSE 134 (H) 12/01/2019 0320   BUN 13 12/01/2019 0320   CREATININE 0.52 12/01/2019 0320   CALCIUM 8.6 (L) 12/01/2019 0320   PROT 5.5 (L) 12/01/2019 0320   ALBUMIN 2.1 (L)  12/01/2019 0320   AST 15 12/01/2019 0320   ALT 11 12/01/2019 0320   ALKPHOS 67 12/01/2019 0320   BILITOT 0.5 12/01/2019 0320   GFRNONAA >60 12/01/2019 0320   GFRAA >60 12/01/2019 0320   Lipase  No results found for: LIPASE     Studies/Results: No results found.  Anti-infectives: Anti-infectives (From admission, onward)   Start     Dose/Rate Route Frequency Ordered Stop   11/25/19 1700  fluconazole (DIFLUCAN) IVPB 400 mg        400 mg 100 mL/hr over 120 Minutes Intravenous Every 24 hours 11/25/19 1649 11/29/19 2356   11/24/19 2000  vancomycin (VANCOCIN) IVPB 1000 mg/200 mL premix  Status:  Discontinued        1,000 mg 200 mL/hr over 60 Minutes Intravenous Every 8 hours 11/24/19 1111 11/25/19 1649   11/24/19 1400  piperacillin-tazobactam (ZOSYN) IVPB 3.375 g        3.375 g 12.5 mL/hr over 240 Minutes Intravenous Every 8 hours 11/24/19 1355 11/29/19 2030   11/24/19 1015  ceFEPIme (MAXIPIME) 2 g in sodium chloride 0.9 % 100 mL IVPB        2 g 200 mL/hr over 30 Minutes Intravenous  Once 11/24/19 1003 11/24/19 1130   11/24/19 1015  metroNIDAZOLE (FLAGYL) IVPB 500 mg        500 mg 100 mL/hr over 60 Minutes Intravenous  Once 11/24/19 1003 11/24/19 1400  11/24/19 0945  vancomycin (VANCOCIN) IVPB 1000 mg/200 mL premix  Status:  Discontinued        1,000 mg 200 mL/hr over 60 Minutes Intravenous  Once 11/24/19 0931 11/24/19 0939   11/24/19 0945  vancomycin (VANCOREADY) IVPB 1500 mg/300 mL        1,500 mg 150 mL/hr over 120 Minutes Intravenous  Once 11/24/19 0939 11/24/19 1418       Assessment/Plan HTN - home med ABLAnemia - Hgb7.5> 5.7 > 6.5 > 5.7 > 6.4.Patient states that due to religious beliefs she would still not want any blood products and understands the risks.Pediatric tube blood draws only to limit loss. We will hold off on daily blood draws to limit loss. Cont IV Ferumoxytol, and Aranesp Code status - DNR/DNI. Note from 8/26 "We discussed that given patients dropping  hgb, she is at increased risk for decompensation that could lead to a cardiac event or death. We discussed code status in detail. Patient wishes to be DNR/DNI. We filled out a MOST form that was signed by the patient and placed in her paper chart as well as scanned into the media section. Her code status was changed on electronic records as well." Malnutrition - prealbumin <5 to 25.3, TPN  Perforated diverticulitis with w/feculent peritonitisand abdominal wall abscess - s/p open sigmoid colectomy and end colostomy (Hartmann's procedure), small bowel resection, incision and drainage of abdominal wall abscess with debridement of skin and subcutaneous fatty tissueby Dr. Donell Beers 8/24 - POD #6 - Completed abx -On soft diet. 1/2 TPN today - WBC 26.6. CT abd/pelvis (PO and IV contrast) today  - WOCN following for new ostomyand wound vac. Plan to change today. Purulent output in cannister  - Mobilize. PT/OT.Recommending HH - Pulm toilet, IS - Path without evidence of malignancy. As noted below  ID -[email or web address]/23.Zosyn 8/23 - 8/28.Diflucan 8/24 -8/28  VTE -SCDs,hold lovenox2/2 anemia FEN -Soft,ensure, 1/2 TPN  Foley -Dc'd 8/26 Follow up -Dr. Donell Beers  Path A. COLON, SIGMOID, RESECTION:  - Segment of colon (25 cm) showing diverticulosis, diverticulitis with  associated abscesses, serositis and serosal adhesions  - Serosal adhesions to portion of benign ovary  - Endometriosis  - Margins appear viable   B. SMALL BOWEL, SEGMENT OF ILEUM, RESECTION:  - Segment of small intestine (11.5 cm) with serositis and serosal  adhesions  - Margins appear viable    LOS: 7 days    [name] , [facility] Surgery 12/01/2019, 8:50 AM Please see Amion for pager number during day hours 7:00am-4:30pm

## 2019-12-01 NOTE — Progress Notes (Signed)
PT Cancellation Note  Patient Details Name: [name] MRN: [id number] DOB: Jan 11, 1966   Cancelled Treatment:    Reason Eval/Treat Not Completed: Patient declined, no reason specified.  Feeling tired, will retry tomorrow.   Ivar Drape 12/01/2019, 5:35 PM   Samul Dada, PT MS Acute Rehab Dept. Number: [facility] [id number] and [facility] [phone]

## 2019-12-01 NOTE — Progress Notes (Signed)
PHARMACY - TOTAL PARENTERAL NUTRITION CONSULT NOTE  Indication: Intolerance to enteral feeding  Patient Measurements: Height: 5' 6.5" (168.9 cm) Weight: 90 kg (198 lb 6.6 oz) IBW/kg (Calculated) : 60.44 TPN AdjBW (KG): 67.8 Body mass index is 31.55 kg/m.  Assessment:  39 YOF with recurrent diverticulitis who presented with worsening abdominal pain. She was recently admitted on 8/1 through 8/9 with complicated diverticulitis and multiple abscesses and underwent percutaneous drain x2 placement by IR on 8/3. CT abd on 8/23 showed numerous fistulous connections between the sigmoid colon and abscess cavity.  Planning ex-lap, partial colectomy, colostomy and I&D of abdominal wall abscess, so Pharmacy consulted to manage TPN due to poor enteral nutrition in setting of severe malnutrition.  Glucose / Insulin: A1c 5.5% - CBGs well controlled.  Used 6 units mSSI in past 24 hrs. Electrolytes: low Na, K 4.1, Mg 1.8 Renal: SCr < 1, BUN WNL LFTs / TGs:  LFTs / tbili / TG WNL  Prealbumin / albumin: BL prealbumin <5 up to 25.3 on 8/30, albumin 2.1, TG 200 (8/30) Intake / Output; MIVF: UOP 0.7 ml/kg/hr. Tolerating soft diet w/o increased abd pain, planning to reduce TPN to half today, LBM 8/28 GI Imaging: none since TPN Surgeries / Procedures:  8/24: open sigmoid colectomy and end colostomy, SBR, I&D of abdominal wall abscess.  8/30 CT abd/pelvis ordered  Central access: PICC placed 11/26/19 TPN start date: 11/26/19   Nutritional Goals (per RD rec on 8/25): 2100-2300 kCal, 125-135g protein, >2.1L fluid per day  Current Nutrition:  TPN Ensure Surgery BID (1 charted given) Advance to soft diet 8/29  Plan:  Reduce TPN to half rate per surgery request - rate of 45 ml/hr provides 63.7g AA, 140g CHO and 31g ILE for a total of 1046 kCal, meeting 50% of patient needs Electrolytes in TPN: incr Na 54mEq/L, cont K 92mEq/L, Ca 98mEq/L, Mag 5 mEq/L, Phos 20mmol/L, Cl:Ac 1:1 Add standard MVI and trace elements  to TPN Continue moderate SSI Q4H Mg 1g IV x 1 dose today Will f/u on tolerance of soft diet and further weaning of TPN Standard TPN labs on Mon and Thurs  Thank you for allowing pharmacy to be a part of this patient's care.  Georgina Pillion, PharmD, BCPS Clinical Pharmacist Clinical phone for 12/01/2019: [phone] 12/01/2019 9:13 AM   **Pharmacist phone directory can now be found on [email or web address] ([email or web address]).  Listed under [facility].

## 2019-12-01 NOTE — Plan of Care (Signed)

## 2019-12-01 NOTE — Progress Notes (Signed)
Nutrition Follow-up  DOCUMENTATION CODES:   Severe malnutrition in context of acute illness/injury  INTERVENTION:   -TPN management per pharmacy -Continue Ensure Surgery BID, each supplement provides 330 kcals and 18 grams protein -Magic cup TID with meals, each supplement provides 290 kcal and 9 grams of protein  NUTRITION DIAGNOSIS:   Severe Malnutrition related to acute illness (perforated diverticulitis with intra-abdominal abscess) as evidenced by mild fat depletion, moderate fat depletion, mild muscle depletion, moderate muscle depletion, percent weight loss.  Ongoing  GOAL:   Patient will meet greater than or equal to 90% of their needs  Progressing   MONITOR:   Diet advancement, Labs, Weight trends, Skin, I & O's  REASON FOR ASSESSMENT:   Consult New TPN/TNA  ASSESSMENT:   [name] is a 54yo female PMH HTN and recurrent diverticulitis who returned to the ED today with 2 days of worsening abdominal pain.  8/24- s/pPROCEDURE PERFORMED:Open sigmoid colectomy and end colostomy (Hartmann's procedure), small bowel resection, incision and drainage of abdominal wall abscess with debridement of skin and subcutaneous fatty tissue 8/25- s/p PICC, TPN initiated 8/26- advanced to clear liquid diet, abdominal wound vac placed 8/27- advanced to full liquid diet 8/29- advanced to soft diet  Reviewed I/O's: -1.5 L x 24 hours and +1.1 L since admission  UOP: 1.5 L x 24 hours  Spoke with pt at bedside, who was pleasant and in good spirits today. She reports that she has been tolerating full liquids well and is enjoying drinking Ensure. She states she will try soft diet for the first time this evening for dinner, but is a little apprehensive ("it's been so long that I've eaten anything"). She is looking forward to her CT scan (currently drinking contrast in preparation for it).   Pt remains on TPN- currently receiving at goal rate of 95 ml/hr, which provides 2190 kcals  and 130 grams protein, meeting 100% of estimated kcal and protein needs. Per pharmacy notes, plan to reduce TPN to half (45 ml/hr) at 1800. Regimen will provides 1046 kcals and 64 grams protein, meeting 50% of estimated kcal needs and 53% of estimated protein needs.   Discussed importance of good meal and supplement intake to promote healing. Encouraged pt to drink supplements, especially as TPN is weaned. Pt shares she drinks Orgain nutritional supplements at home and is prepared to drink these after she discharges.   Labs reviewed:N: 134, K, Mg, and Phos WDL, CBGS: 143-165 (inpatient orders for glycemic control are 0-15 units insulin aspart every 4 hours).   Diet Order:   Diet Order            DIET SOFT Room service appropriate? Yes; Fluid consistency: Thin  Diet effective now                 EDUCATION NEEDS:   Education needs have been addressed  Skin:  Skin Assessment: Skin Integrity Issues: Skin Integrity Issues:: Wound VAC Wound Vac: abdomen Incisions: closed abdomen  Last BM:  11/29/19 (via colostomy)  Height:   Ht Readings from Last 1 Encounters:  11/25/19 5' 6.5" (1.689 m)    Weight:   Wt Readings from Last 1 Encounters:  11/25/19 90 kg    Ideal Body Weight:  60.2 kg  BMI:  Body mass index is 31.55 kg/m.  Estimated Nutritional Needs:   Kcal:  2100-2300  Protein:  120-135 grams  Fluid:  > 2.1 L    Levada Schilling, RD, LDN, CDCES Registered Dietitian II Certified Diabetes Care  and Education Specialist Please refer to [facility] for RD and/or RD on-call/weekend/after hours pager

## 2019-12-01 NOTE — Progress Notes (Signed)
Received call from central telemetry stating patient's HR sustained in the 150's. Informed them patient was in CT scan and I would call CT.   Spoke with CT and she states patient is feeling fine with no issues and about to be scanned. She is showing 128 on the monitor on 6N.

## 2019-12-02 ENCOUNTER — Inpatient Hospital Stay (HOSPITAL_COMMUNITY): Payer: Commercial Managed Care - PPO

## 2019-12-02 ENCOUNTER — Encounter (HOSPITAL_COMMUNITY): Payer: Commercial Managed Care - PPO

## 2019-12-02 DIAGNOSIS — I82432 Acute embolism and thrombosis of left popliteal vein: Secondary | ICD-10-CM

## 2019-12-02 DIAGNOSIS — I82442 Acute embolism and thrombosis of left tibial vein: Secondary | ICD-10-CM

## 2019-12-02 DIAGNOSIS — K651 Peritoneal abscess: Secondary | ICD-10-CM

## 2019-12-02 LAB — GLUCOSE, CAPILLARY
Glucose-Capillary: 152 mg/dL — ABNORMAL HIGH (ref 70–99)
Glucose-Capillary: 135 mg/dL — ABNORMAL HIGH (ref 70–99)
Glucose-Capillary: 153 mg/dL — ABNORMAL HIGH (ref 70–99)
Glucose-Capillary: 150 mg/dL — ABNORMAL HIGH (ref 70–99)
Glucose-Capillary: 133 mg/dL — ABNORMAL HIGH (ref 70–99)

## 2019-12-02 MED ORDER — TRAVASOL 10 % IV SOLN
INTRAVENOUS | Status: AC
  Filled 2019-12-02: qty 637.2

## 2019-12-02 MED ORDER — PIPERACILLIN-TAZOBACTAM 3.375 G IVPB
3.3750 g | Freq: Three times a day (TID) | INTRAVENOUS | Status: DC
  Administered 2019-12-02 – 2019-12-11 (×26): 3.375 g via INTRAVENOUS
  Filled 2019-12-02 (×25): qty 50

## 2019-12-02 NOTE — Progress Notes (Addendum)
7 Days Post-Op  Subjective: CC: Answered questions about surgery She reports she has continued pain in her left abdomen and top of her incision that is similar to yesterday. No n/v. Tolerated soft diet for dinner yesterday (meatloaf and mashed potatoes). Having colostomy output. Reports she started having dysuria two days ago. No flank pain. Afebrile.   Objective: Vital signs in last 24 hours: Temp:  [98.3 F (36.8 C)-98.4 F (36.9 C)] 98.4 F (36.9 C) (08/31 0425) Pulse Rate:  [92-110] 110 (08/31 0425) Resp:  [16-18] 17 (08/31 0425) BP: (110-118)/(73-77) 118/74 (08/31 0425) SpO2:  [100 %] 100 % (08/31 0425) Last BM Date: 12/01/19  Intake/Output from previous day: 08/30 0701 - 08/31 0700 In: 1420.1 [P.O.:840; I.V.:580.1] Out: 1300 [Urine:1250; Drains:50] Intake/Output this shift: No intake/output data recorded.  PE: Gen: Alert, NAD, pleasant HEENT: EOM's intact, pupils equal and round Card: RRR Pulm: CTAB, no W/R/R, effort normal. Off o2 Abd: Soft,mild distension, there is tenderness of theleftabdomenand epigastrium that is improved since yesterday.No peritonitis.+BS. Colostomy in place. There is somebrown stool in the bag.Unable to see stoma passed stool.Midline wound with vac in place withthick, bloody/purulent output with sedimentin cannister.See picture from vac change yesterday below.  Ext: No LE edemaor tenderness. Femoral and DP pulses 2+ bilaterally.  Psych: A&Ox3  Skin:No rashes noted, warm and dry      Lab Results:  Recent Labs    12/01/19 0320  WBC 26.6*  HGB 6.4*  HCT 19.2*  PLT 519*   BMET Recent Labs    11/30/19 0305 12/01/19 0320  NA 133* 134*  K 4.3 4.1  CL 102 101  CO2 26 26  GLUCOSE 122* 134*  BUN 14 13  CREATININE 0.54 0.52  CALCIUM 8.4* 8.6*   PT/INR No results for input(s): LABPROT, INR in the last 72 hours. CMP     Component Value Date/Time   NA 134 (L) 12/01/2019 0320   K 4.1 12/01/2019 0320   CL 101  12/01/2019 0320   CO2 26 12/01/2019 0320   GLUCOSE 134 (H) 12/01/2019 0320   BUN 13 12/01/2019 0320   CREATININE 0.52 12/01/2019 0320   CALCIUM 8.6 (L) 12/01/2019 0320   PROT 5.5 (L) 12/01/2019 0320   ALBUMIN 2.1 (L) 12/01/2019 0320   AST 15 12/01/2019 0320   ALT 11 12/01/2019 0320   ALKPHOS 67 12/01/2019 0320   BILITOT 0.5 12/01/2019 0320   GFRNONAA >60 12/01/2019 0320   GFRAA >60 12/01/2019 0320   Lipase  No results found for: LIPASE     Studies/Results: CT ABDOMEN PELVIS W CONTRAST  Addendum Date: 12/01/2019   ADDENDUM REPORT: 12/01/2019 17:52 ADDENDUM: These results were called by telephone at the time of interpretation on 12/01/2019 at 5:52 pm to provider Dr. Derrell Lolling, who verbally acknowledged these results. Electronically Signed   By: Donzetta Kohut M.D.   On: 12/01/2019 17:52   Result Date: 12/01/2019 CLINICAL DATA:  Abdominal pain, postoperative fevers EXAM: CT ABDOMEN AND PELVIS WITH CONTRAST TECHNIQUE: Multidetector CT imaging of the abdomen and pelvis was performed using the standard protocol following bolus administration of intravenous contrast. CONTRAST:  OMNIPAQUE IOHEXOL 300 MG/ML  SOLN COMPARISON:  11/24/2019 FINDINGS: Lower chest: Basilar atelectasis. No consolidation or sign of pleural effusion. Hepatobiliary: No focal, suspicious hepatic lesion. No biliary duct distension. Numerous gallstones in the gallbladder without pericholecystic stranding. These layer within partially opacified bile perhaps from vicarious excretion of previously administered intravenous contrast. Portal vein is patent. Pancreas: Pancreas without ductal dilation  or inflammation. Spleen: Spleen normal size and contour. Adrenals/Urinary Tract: Adrenal glands are normal. Patchy bilateral nephrogram suggested in the bilateral kidneys. No hydronephrosis urinary bladder with thickening in the setting of persistent intra-abdominal fluid. Pelvic fluid sits atop the urinary bladder and is adjacent to  the colonic pouch. Interval formation of a LEFT lower quadrant colostomy since the previous exam. Pelvic fluid measuring 6.5 x 3.2 x 2.8 cm. Stomach/Bowel: Stomach is moderately distended. Small bowel loops mildly dilated. Appendix is normal. Colonic diverticulosis. LEFT lower quadrant colostomy. Dilation of mid small bowel and proximal small bowel. Vascular/Lymphatic: Vascular structures in the abdomen are patent. No adenopathy. No pelvic adenopathy. Potential filling defect in the RIGHT superficial femoral vein on image 97 of series 3. Reproductive: Post hysterectomy. Other: Wound VAC in place over the anterior abdomen. Stranding in the rectus muscles following recent surgery. Superficial abscess has been evacuated. Musculoskeletal: No acute musculoskeletal process. No destructive bone finding. IMPRESSION: 1. Possible filling defect in the RIGHT superficial femoral vein, consider dedicated lower extremity venous evaluation to exclude the presence of venous thrombosis. This could also represent mixing but appears abrupt, only seen on 2 images of the current exam. Post partial colonic resection and LEFT lower quadrant colostomy creation. 2. Partial small bowel obstruction versus ileus in the LEFT hemiabdomen, favor postoperative ileus. 3. Signs of pyelonephritis with striated nephrogram of bilateral kidneys. Correlation with urinalysis is suggested 4. Pelvic fluid measuring 6.5 x 3.2 x 2.8 cm. This is diminished in size compared to the previous study where it measured approximately 6.7 x 6.4 cm and was less well-defined in contain more debris. This is compatible with residual abscess. Other scattered foci of fluid and inflammation are similarly diminished in size. 5. Patchy bilateral nephrogram suggested in the bilateral kidneys. Correlate with any clinical or laboratory evidence of pyelonephritis. 6. Cholelithiasis without evidence of acute cholecystitis. Call is out to the referring provider to further discuss  findings in the above case. Electronically Signed: By: Donzetta Kohut M.D. On: 12/01/2019 17:38    Anti-infectives: Anti-infectives (From admission, onward)   Start     Dose/Rate Route Frequency Ordered Stop   11/25/19 1700  fluconazole (DIFLUCAN) IVPB 400 mg        400 mg 100 mL/hr over 120 Minutes Intravenous Every 24 hours 11/25/19 1649 11/29/19 2356   11/24/19 2000  vancomycin (VANCOCIN) IVPB 1000 mg/200 mL premix  Status:  Discontinued        1,000 mg 200 mL/hr over 60 Minutes Intravenous Every 8 hours 11/24/19 1111 11/25/19 1649   11/24/19 1400  piperacillin-tazobactam (ZOSYN) IVPB 3.375 g        3.375 g 12.5 mL/hr over 240 Minutes Intravenous Every 8 hours 11/24/19 1355 11/29/19 2030   11/24/19 1015  ceFEPIme (MAXIPIME) 2 g in sodium chloride 0.9 % 100 mL IVPB        2 g 200 mL/hr over 30 Minutes Intravenous  Once 11/24/19 1003 11/24/19 1130   11/24/19 1015  metroNIDAZOLE (FLAGYL) IVPB 500 mg        500 mg 100 mL/hr over 60 Minutes Intravenous  Once 11/24/19 1003 11/24/19 1400   11/24/19 0945  vancomycin (VANCOCIN) IVPB 1000 mg/200 mL premix  Status:  Discontinued        1,000 mg 200 mL/hr over 60 Minutes Intravenous  Once 11/24/19 0931 11/24/19 0939   11/24/19 0945  vancomycin (VANCOREADY) IVPB 1500 mg/300 mL        1,500 mg 150 mL/hr over 120 Minutes Intravenous  Once 11/24/19 0939 11/24/19 1418       Assessment/Plan HTN - home med ABLAnemia - Hgb7.5> 5.7 > 6.5> 5.7 > 6.4.Patient states that due to religious beliefs she would still not want any blood products and understands the risks.Pediatric tube blood draws only to limit loss. We will hold off on daily blood draws to limit loss.Cont IV Ferumoxytol, and Aranesp Code status - DNR/DNI. Note from 8/26 "We discussed that given patients dropping hgb, she is at increased risk for decompensation that could lead to a cardiac event or death. We discussed code status in detail. Patient wishes to be DNR/DNI. We filled out a  MOST form that was signed by the patient and placed in her paper chart as well as scanned into the media section. Her code status was changed on electronic records as well." Malnutrition - prealbumin <5 to 25.3, TPN Possible filling defect of right superficial femoral vein on CT- check LE venous ultrasound. If positive, will touch base with vascular.  Possible Pyelonephritis on CT - Check UA and UCx. Reports some dysuria that started 2 days ago.   Perforated diverticulitis with w/feculent peritonitisand abdominal wall abscess - s/p open sigmoid colectomy and end colostomy (Hartmann's procedure), small bowel resection, incision and drainage of abdominal wall abscess with debridement of skin and subcutaneous fatty tissueby Dr. Donell Beers 8/24 - POD #7 - CT yesterday with Pelvic fluid measuring 6.5 x 3.2 x 2.8 cm. Will start back on abx and ask IR to see for possible drainage.  -Cont 1/2 TPN today - WOCN following for new ostomyand wound vac. Change m/w/f. Output from vac purulent - Mobilize. PT/OT.Recommending HH - Pulm toilet, IS - Path without evidence of malignancy. As noted below  ID -maxipime/flagylx1.vancomycin8/23.Zosyn 8/23- 8/28.Diflucan 8/24-8/28. Zosyn 8/31 >>  VTE -SCDs,hold lovenox2/2 anemia FEN -NPO for IR consult. If no procedures planned can go back on soft +ensure. Cont 1/2 TPN  Foley -Dc'd 8/26 Follow up -Dr. Donell Beers  Path A. COLON, SIGMOID, RESECTION:  - Segment of colon (25 cm) showing diverticulosis, diverticulitis with  associated abscesses, serositis and serosal adhesions  - Serosal adhesions to portion of benign ovary  - Endometriosis  - Margins appear viable   B. SMALL BOWEL, SEGMENT OF ILEUM, RESECTION:  - Segment of small intestine (11.5 cm) with serositis and serosal  adhesions  - Margins appear viable    LOS: 8 days    [name] , [facility] 12/02/2019, 8:08 AM Please see Amion for pager number during day  hours 7:00am-4:30pm

## 2019-12-02 NOTE — Progress Notes (Signed)
OT Cancellation Note  Patient Details Name: Willadean Guyton MRN: [id number] DOB: [birth year]   Cancelled Treatment:    Reason Eval/Treat Not Completed: Medical issues which prohibited therapy Pt found to have DVT...is "age indeterminate" but held for medical management. Will continue to follow acutely.   Pollyann Glen E. Annalee Meyerhoff, COTA/L Acute Rehabilitation Services [phone]    Cherlyn Cushing 12/02/2019, 1:08 PM

## 2019-12-02 NOTE — Consult Note (Addendum)
[facility] NOTE   MRN : [id number]  Reason for Consult: DVT age not determined left popliteal vein and left posterior tibial vein  Referring Physician: Delton Coombes PA  History of Present Illness: 54 y/o female reported to the ED with recurrent diverticulitis and abdominal pain with complication of multiple abscesses.   Cultures grew E coli, strep C, pseudomonas.    Her medical history is complicated with anemia of unknown cause diagnosed in early July with a hemaglobin of 6.4 currently.  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    Filling defect of the right superficial vein was detected and venous duplex was ordered to r/o DVT.  This showed age indeterminate deep vein thrombosis involving the left popliteal vein, and left posterior tibial veins.   No evidence of right LE DVT.  She denise history of DVT, lower ext edema, or cellulitis.  She denise history of PAD and CAD.    Current Facility-Administered Medications  Medication Dose Route Frequency Provider Last Rate Last Admin  . acetaminophen (TYLENOL) tablet 1,000 mg  1,000 mg Oral Q8H [name], PA-C   1,000 mg at 12/02/19 1337  . ALPRAZolam Prudy Feeler) tablet 0.5 mg  0.5 mg Oral TID PRN Violeta Gelinas, MD   0.5 mg at 11/29/19 0108  . Chlorhexidine Gluconate Cloth 2 % PADS 6 each  6 each Topical Daily Almond Lint, MD   6 each at 12/02/19 1325  . Darbepoetin Alfa (ARANESP) injection 40 mcg  40 mcg Subcutaneous Q Fri-1800 Scarlett Presto, RPH   40 mcg at 11/29/19 1158  . feeding supplement (ENSURE SURGERY) liquid 237 mL  237 mL Oral BID BM [name], PA-C   237 mL at 12/02/19 1325  . hydrochlorothiazide (MICROZIDE) capsule 12.5 mg  12.5 mg Oral Daily Meuth, Brooke A, PA-C   12.5 mg at 12/02/19 0954  . HYDROmorphone (DILAUDID) injection 1 mg  1 mg Intravenous Q2H PRN [name], PA-C   1 mg at 12/01/19 1454  . insulin aspart (novoLOG) injection 0-15 Units   0-15 Units Subcutaneous Q4H Gerilyn Nestle, [facility]   3 Units at 12/02/19 0433  . losartan (COZAAR) tablet 50 mg  50 mg Oral Daily Meuth, Brooke A, PA-C   50 mg at 12/02/19 0954  . methocarbamol (ROBAXIN) tablet 500 mg  500 mg Oral QID [name], PA-C   500 mg at 12/02/19 1337  . metoprolol tartrate (LOPRESSOR) injection 5 mg  5 mg Intravenous Q6H PRN Meuth, Brooke A, PA-C      . ondansetron (ZOFRAN-ODT) disintegrating tablet 4 mg  4 mg Oral Q6H PRN Meuth, Brooke A, PA-C   4 mg at 11/24/19 2049   Or  . ondansetron (ZOFRAN) injection 4 mg  4 mg Intravenous Q6H PRN Meuth, Brooke A, PA-C   4 mg at 12/01/19 0943  . oxyCODONE (Oxy IR/ROXICODONE) immediate release tablet 5-10 mg  5-10 mg Oral Q4H PRN [name], PA-C   10 mg at 12/02/19 0954  . pantoprazole (PROTONIX) injection 40 mg  40 mg Intravenous QHS Meuth, Brooke A, PA-C   40 mg at 12/01/19 2109  . piperacillin-tazobactam (ZOSYN) IVPB 3.375 g  3.375 g Intravenous Q8H Dang, Thuy D, RPH      . sodium chloride flush (NS) 0.9 % injection 10-40 mL  10-40 mL Intracatheter PRN Meuth, Brooke A, PA-C   10 mL at 11/29/19 0331  . TPN ADULT (ION)  Intravenous Continuous TPN Ann Held, [place] 45 mL/hr at 12/01/19 1740 New Bag at 12/01/19 1740  . TPN ADULT (ION)   Intravenous Continuous TPN Dang, Thuy D, RPH        Pt meds include: Statin :No Betablocker: No ASA: No Other anticoagulants/antiplatelets: none  Past Medical History:  Diagnosis Date  . Diverticulitis   . Hypertension   . Refusal of blood product     Past Surgical History:  Procedure Laterality Date  . ABDOMINAL HYSTERECTOMY    . BOWEL RESECTION N/A 11/25/2019   Procedure: SMALL BOWEL RESECTION;  Surgeon: Almond Lint, MD;  Location: [facility] OR;  Service: General;  Laterality: N/A;  . COLECTOMY N/A 11/25/2019   Procedure: SIGMOID COLECTOMY;  Surgeon: Almond Lint, MD;  Location: [facility] OR;  Service: General;  Laterality: N/A;  . COLOSTOMY N/A 11/25/2019   Procedure: CREATION  OF COLOSTOMY;  Surgeon: Almond Lint, MD;  Location: [facility] OR;  Service: General;  Laterality: N/A;  . INCISION AND DRAINAGE ABSCESS N/A 11/25/2019   Procedure: INCISION AND DRAINAGE ABDOMINAL ABSCESS;  Surgeon: Almond Lint, MD;  Location: [facility] OR;  Service: General;  Laterality: N/A;  . LAPAROTOMY N/A 11/25/2019   Procedure: EXPLORATORY LAPAROTOMY;  Surgeon: Almond Lint, MD;  Location: [facility] OR;  Service: General;  Laterality: N/A;    Social History Social History   Tobacco Use  . Smoking status: Never Smoker  . Smokeless tobacco: Never Used  Substance Use Topics  . Alcohol use: No  . Drug use: No    Family History Family History  Problem Relation Age of Onset  . Cancer Neg Hx     Allergies  Allergen Reactions  . Aspirin     diarheea , stomach ache.      REVIEW OF SYSTEMS  General: [ ]  Weight loss, [ ]  Fever, [ ]  chills Neurologic: [ ]  Dizziness, [ ]  Blackouts, [ ]  Seizure [ ]  Stroke, [ ]  "Mini stroke", [ ]  Slurred speech, [ ]  Temporary blindness; [ ]  weakness in arms or legs, [ ]  Hoarseness [ ]  Dysphagia Cardiac: [ ]  Chest pain/pressure, [ ]  Shortness of breath at rest [ ]  Shortness of breath with exertion, [ ]  Atrial fibrillation or irregular heartbeat  Vascular: [ ]  Pain in legs with walking, [ ]  Pain in legs at rest, [ ]  Pain in legs at night,  [ ]  Non-healing ulcer, [ ]  Blood clot in vein/DVT,   Pulmonary: [ ]  Home oxygen, [ ]  Productive cough, [ ]  Coughing up blood, [ ]  Asthma,  [ ]  Wheezing [ ]  COPD Musculoskeletal:  [ ]  Arthritis, [ ]  Low back pain, [ ]  Joint pain Hematologic: [ ]  Easy Bruising, [ ]  Anemia; [ ]  Hepatitis Gastrointestinal: [ ]  Blood in stool, [ ]  Gastroesophageal Reflux/heartburn, [x]  diverticulitis Urinary: [ ]  chronic Kidney disease, [ ]  on HD - [ ]  MWF or [ ]  TTHS, [ ]  Burning with urination, [ ]  Difficulty urinating Skin: [ ]  Rashes, [ ]  Wounds Psychological: [ ]  Anxiety, [ ]  Depression  Physical Examination Vitals:   12/01/19 0425 12/01/19  1604 12/01/19 2212 12/02/19 0425  BP: 115/73 113/77 110/73 118/74  Pulse: 86 92 (!) 102 (!) 110  Resp: 18 18 16 17   Temp: 98 F (36.7 C) 98.3 F (36.8 C) 98.3 F (36.8 C) 98.4 F (36.9 C)  TempSrc: Oral Oral Oral Oral  SpO2: 99% 100% 100% 100%  Weight:      Height:       Body mass  index is 31.55 kg/m.  General:  WDWN in NAD HENT: WNL Eyes: Pupils equal Pulmonary: normal non-labored breathing , without Rales, rhonchi,  wheezing Cardiac: RRR, without  Murmurs, rubs or gallops; No carotid bruits Abdomen: open with abdominal wound vac in place. Skin: no rashes, ulcers noted;  no Gangrene , no cellulitis; no open wounds;   Vascular Exam/Pulses:Palpable radial and DP/PT pulses B No evidence of edema in B LE   Musculoskeletal: no muscle wasting or atrophy; no edema  Neurologic: A&O X 3; Appropriate Affect ;  SENSATION: normal; MOTOR FUNCTION: 5/5 Symmetric Speech is fluent/normal   Significant Diagnostic Studies: CBC Lab Results  Component Value Date   WBC 26.6 (H) 12/01/2019   HGB 6.4 (LL) 12/01/2019   HCT 19.2 (L) 12/01/2019   MCV 73.3 (L) 12/01/2019   PLT 519 (H) 12/01/2019    BMET    Component Value Date/Time   NA 134 (L) 12/01/2019 0320   K 4.1 12/01/2019 0320   CL 101 12/01/2019 0320   CO2 26 12/01/2019 0320   GLUCOSE 134 (H) 12/01/2019 0320   BUN 13 12/01/2019 0320   CREATININE 0.52 12/01/2019 0320   CALCIUM 8.6 (L) 12/01/2019 0320   GFRNONAA >60 12/01/2019 0320   GFRAA >60 12/01/2019 0320   Estimated Creatinine Clearance: 90.6 mL/min (by C-G formula based on SCr of 0.52 mg/dL).  COAG Lab Results  Component Value Date   INR 1.4 (H) 11/24/2019   INR 1.4 (H) 11/04/2019     Non-Invasive Vascular Imaging:  RIGHT  CompressibilityPhasicitySpontaneityPropertiesThrombus  Aging  +---------+---------------+---------+-----------+----------+--------------+   CFV   Full      Yes   Yes                     +---------+---------------+---------+-----------+----------+--------------+   SFJ   Full                                 +---------+---------------+---------+-----------+----------+--------------+   FV Prox Full                                 +---------+---------------+---------+-----------+----------+--------------+   FV Mid  Full                                 +---------+---------------+---------+-----------+----------+--------------+   FV DistalFull                                 +---------+---------------+---------+-----------+----------+--------------+   PFV   Full                                 +---------+---------------+---------+-----------+----------+--------------+   POP   Full      Yes   Yes                    +---------+---------------+---------+-----------+----------+--------------+   PTV   Full                                 +---------+---------------+---------+-----------+----------+--------------+   PERO   Full                                 +---------+---------------+---------+-----------+----------+--------------+          +---------+---------------+---------+-----------+----------+---------------  --+  LEFT   CompressibilityPhasicitySpontaneityPropertiesThrombus Aging    +---------+---------------+---------+-----------+----------+---------------  --+  CFV   Full      Yes   Yes                    +---------+---------------+---------+-----------+----------+---------------  --+  SFJ   Full                                  +---------+---------------+---------+-----------+----------+---------------  --+  FV Prox Full                                 +---------+---------------+---------+-----------+----------+---------------  --+  FV Mid  Full                                 +---------+---------------+---------+-----------+----------+---------------  --+  FV DistalFull                                 +---------+---------------+---------+-----------+----------+---------------  --+  PFV   Full                                 +---------+---------------+---------+-----------+----------+---------------  --+  POP   None      Yes   Yes         Age  Indeterminate  +---------+---------------+---------+-----------+----------+---------------  --+  PTV   Partial                    Age  Indeterminate  +---------+---------------+---------+-----------+----------+---------------  --+  PERO   Full                                 +---------+---------------+---------+-----------+----------+---------------  --+              Summary:  RIGHT:  - There is no evidence of deep vein thrombosis in the lower extremity.    - No cystic structure found in the popliteal fossa.    LEFT:  - Findings consistent with age indeterminate deep vein thrombosis  involving the left popliteal vein, and left posterior tibial veins.  - No cystic structure found in the popliteal fossa.     ASSESSMENT/PLAN:   Asymptomatic Left LE popliteal/PT vein DVT age indeterminate.  She has no signs or symptoms of Phlegmasia, no edema or cellulitis.  I am hesitant to start her on anticoagulation secondary to her religious beliefs and her anemia.  I suggest we repeat the DVT duplex in 48 hours as long as  she remains asymptomatic to check for propagation of the DVT.          Mosetta Pigeon 12/02/2019 2:17 PM   I agree with the above.  I have seen and evaluated the patient.  Briefly, this is a 54 year old female who presented with perforated diverticulitis.  On 11/25/2019 she underwent open sigmoid colectomy with end colostomy and small bowel resection as well as incision and draining of an abdominal wall abscess.  She has an elevated white count and CT shows evidence of undrained abscesses for which she is scheduled for percutaneous drainage tomorrow by interventional radiology.  On her CT scan there was suggestion of a possible DVT and so  a duplex ultrasound was performed.  This showed a left popliteal and posterior tibial vein DVT.  The patient is anemic with a hemoglobin of 6.4.  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  Normally, I would recommend anticoagulation for her DVT however with her anemia and inability to receive a blood transfusion, this is not the best course of action.  I spoke with the patient and I feel that a removable IVC filter is most appropriate in this setting.  The patient has agreed to proceed and have this placed to prevent life-threatening pulmonary embolism.  I spoke with interventional radiology today and they are willing to place the filter at the time of her abscess drained tomorrow.  I discussed with the patient that this is a filter I would like to have removed once she has recovered from her acute illness.  Durene Cal

## 2019-12-02 NOTE — Consult Note (Signed)
Chief Complaint: Patient was seen in consultation today for  Chief Complaint  Patient presents with  . Abdominal Pain  . Diverticulitis    Referring Physician(s):   Supervising Physician: Malachy Moan  Patient Status: [facility] - In-pt  History of Present Illness: [name] is a 54 y.o. female with a medical history significant for HTN and diverticulosis with frequent bouts of diverticulitis over the past year. She was recently hospitalized at [facility] (8/1-8/9 2021) for complicated diverticulitis with severe sepsis. Her hospital course was complicated by multiple abdominal abscesses growing E.Coli, Pseudomonas and Streptococcus. Surgery and IR were consulted that admission and the decision was made to place two percutaneous drains into the abscesses. After she was discharged she encountered complications with her pharmacy and obtaining the necessary antibiotics.   She was readmitted 8/13-8/16 for worsening abdominal pain, foul-smelling drainage, leukocytosis and tachycardia. CT imaging demonstrated persistent abscesses with developing fistulas. Surgery and IR consulted, no interventions were deemed appropriate and recommendations were made to continue antibiotics. She was stable at discharge and had follow up orders in place with both IR and General Surgery.  She presented to the ED again on 11/24/19 with abdominal pain, poor appetite and purulent drainage around drain sites. CT imaging showed a significant increase in the size of the lower anterior abdominal wall abscess, chronic diverticulitis within the sigmoid colon and numerous fistulas between the sigmoid colon and abscess cavity. She was taken to the OR with Dr. Donell Beers 11/25/19 for an open sigmoid colectomy and end colostomy (Hartmann's procedure), small bowel resection, I&D of abdominal wall abscess with debridement of skin and subcutaneous fatty tissue.   Repeat CT abdomen/pelvis 12/01/19: IMPRESSION: 1. Possible filling  defect in the RIGHT superficial femoral vein, consider dedicated lower extremity venous evaluation to exclude the presence of venous thrombosis. This could also represent mixing but appears abrupt, only seen on 2 images of the current exam. Post partial colonic resection and LEFT lower quadrant colostomy creation. 2. Partial small bowel obstruction versus ileus in the LEFT hemiabdomen, favor postoperative ileus. 3. Signs of pyelonephritis with striated nephrogram of bilateral kidneys. Correlation with urinalysis is suggested 4. Pelvic fluid measuring 6.5 x 3.2 x 2.8 cm. This is diminished in size compared to the previous study where it measured approximately 6.7 x 6.4 cm and was less well-defined in contain more debris. This is compatible with residual abscess. Other scattered foci of fluid and inflammation are similarly diminished in size. 5. Patchy bilateral nephrogram suggested in the bilateral kidneys. Correlate with any clinical or laboratory evidence of pyelonephritis. 6. Cholelithiasis without evidence of acute cholecystitis  Interventional Radiology has been asked to evaluate this patient for an image-guided pelvic fluid collection aspiration with possible drain placement. This case has been reviewed and procedure approved by Dr. Archer Asa.   Past Medical History:  Diagnosis Date  . Diverticulitis   . Hypertension   . Refusal of blood product     Past Surgical History:  Procedure Laterality Date  . ABDOMINAL HYSTERECTOMY    . BOWEL RESECTION N/A 11/25/2019   Procedure: SMALL BOWEL RESECTION;  Surgeon: Almond Lint, MD;  Location: [facility] OR;  Service: General;  Laterality: N/A;  . COLECTOMY N/A 11/25/2019   Procedure: SIGMOID COLECTOMY;  Surgeon: Almond Lint, MD;  Location: [facility] OR;  Service: General;  Laterality: N/A;  . COLOSTOMY N/A 11/25/2019   Procedure: CREATION OF COLOSTOMY;  Surgeon: Almond Lint, MD;  Location: [facility] OR;  Service: General;  Laterality: N/A;  . INCISION  AND  DRAINAGE ABSCESS N/A 11/25/2019   Procedure: INCISION AND DRAINAGE ABDOMINAL ABSCESS;  Surgeon: Almond Lint, MD;  Location: [facility] OR;  Service: General;  Laterality: N/A;  . LAPAROTOMY N/A 11/25/2019   Procedure: EXPLORATORY LAPAROTOMY;  Surgeon: Almond Lint, MD;  Location: [facility] OR;  Service: General;  Laterality: N/A;    Allergies: Aspirin  Medications: Prior to Admission medications   Medication Sig Start Date End Date Taking? Authorizing Provider  ALPRAZolam Prudy Feeler) 0.5 MG tablet Take 0.5 mg by mouth 3 (three) times daily as needed for anxiety.   Yes [provider]  ferrous sulfate 325 (65 FE) MG tablet Take 1 tablet (325 mg total) by mouth 2 (two) times daily with a meal. 11/10/19 12/10/19 Yes Pahwani, Daleen Bo, MD  hyoscyamine (LEVSIN) 0.125 MG tablet Take 0.125 mg by mouth every 6 (six) hours as needed for bladder spasms or cramping.  10/21/19  Yes [provider]  levofloxacin (LEVAQUIN) 500 MG tablet Take 1 tablet (500 mg total) by mouth daily for 25 days. 11/10/19 12/05/19 Yes Meuth, Lina Sar, PA-C  losartan-hydrochlorothiazide (HYZAAR) 50-12.5 MG per tablet Take 1 tablet by mouth daily.   Yes [provider]  metroNIDAZOLE (FLAGYL) 500 MG tablet Take 1 tablet (500 mg total) by mouth 3 (three) times daily for 25 days. 11/10/19 12/05/19 Yes Meuth, Brooke A, PA-C  ondansetron (ZOFRAN) 4 MG tablet Take 1 tablet (4 mg total) by mouth every 6 (six) hours as needed for nausea. 11/17/19  Yes Sheikh, Omair Latif, DO  oxyCODONE (OXY IR/ROXICODONE) 5 MG immediate release tablet Take 5 mg by mouth every 6 (six) hours as needed for pain. 11/22/19  Yes [provider]  polyethylene glycol (MIRALAX / GLYCOLAX) 17 g packet Take 17 g by mouth daily as needed for mild constipation. 11/17/19  Yes Sheikh, Omair Latif, DO  promethazine (PHENERGAN) 25 MG tablet Take 25 mg by mouth 2 (two) times daily as needed for nausea or vomiting.  10/21/19  Yes [provider]  saccharomyces  boulardii (FLORASTOR) 250 MG capsule Take 1 capsule (250 mg total) by mouth 2 (two) times daily. 11/10/19  Yes Meuth, Brooke A, PA-C  sodium chloride flush (NS) 0.9 % SOLN Inject 5 mLs into the vein daily. 11/17/19  Yes Merlene Laughter, DO     Family History  Problem Relation Age of Onset  . Cancer Neg Hx     Social History   Socioeconomic History  . Marital status: Single    Spouse name: Not on file  . Number of children: Not on file  . Years of education: Not on file  . Highest education level: Not on file  Occupational History  . Not on file  Tobacco Use  . Smoking status: Never Smoker  . Smokeless tobacco: Never Used  Substance and Sexual Activity  . Alcohol use: No  . Drug use: No  . Sexual activity: Not on file  Other Topics Concern  . Not on file  Social History Narrative  . Not on file   Social Determinants of Health   Financial Resource Strain:   . Difficulty of Paying Living Expenses: Not on file  Food Insecurity:   . Worried About Programme researcher, broadcasting/film/video in the Last Year: Not on file  . Ran Out of Food in the Last Year: Not on file  Transportation Needs:   . Lack of Transportation (Medical): Not on file  . Lack of Transportation (Non-Medical): Not on file  Physical Activity:   . Days  of Exercise per Week: Not on file  . Minutes of Exercise per Session: Not on file  Stress:   . Feeling of Stress : Not on file  Social Connections:   . Frequency of Communication with Friends and Family: Not on file  . Frequency of Social Gatherings with Friends and Family: Not on file  . Attends Religious Services: Not on file  . Active Member of Clubs or Organizations: Not on file  . Attends Banker Meetings: Not on file  . Marital Status: Not on file    Review of Systems: A 12 point ROS discussed and pertinent positives are indicated in the HPI above.  All other systems are negative.  Review of Systems  Constitutional: Positive for appetite change and  fatigue.  Respiratory: Negative for cough and shortness of breath.   Cardiovascular: Negative for chest pain and leg swelling.  Gastrointestinal: Positive for abdominal pain. Negative for nausea and vomiting.  Musculoskeletal: Negative for back pain.  Neurological: Negative for headaches.    Vital Signs: BP 118/74 (BP Location: Left Arm)   Pulse (!) 110   Temp 98.4 F (36.9 C) (Oral)   Resp 17   Ht 5' 6.5" (1.689 m)   Wt 198 lb 6.6 oz (90 kg)   SpO2 100%   BMI 31.55 kg/m   Physical Exam Constitutional:      General: She is not in acute distress. HENT:     Mouth/Throat:     Mouth: Mucous membranes are moist.     Pharynx: Oropharynx is clear.  Cardiovascular:     Rate and Rhythm: Normal rate and regular rhythm.     Pulses: Normal pulses.     Heart sounds: Normal heart sounds.  Pulmonary:     Effort: Pulmonary effort is normal.     Breath sounds: Decreased air movement present.     Comments: Patient encouraged to use her Incentive Spirometer.  Abdominal:     General: Bowel sounds are normal.     Palpations: Abdomen is soft.     Tenderness: There is abdominal tenderness.     Comments: Midline negative pressure dressing. Left abdominal colostomy. Unable to view stoma.   Musculoskeletal:     Cervical back: Normal range of motion.  Skin:    General: Skin is warm and dry.  Neurological:     Mental Status: She is alert and oriented to person, place, and time.     Imaging: CT ABDOMEN PELVIS W CONTRAST  Addendum Date: 12/01/2019   ADDENDUM REPORT: 12/01/2019 17:52 ADDENDUM: These results were called by telephone at the time of interpretation on 12/01/2019 at 5:52 pm to provider Dr. Derrell Lolling, who verbally acknowledged these results. Electronically Signed   By: Donzetta Kohut M.D.   On: 12/01/2019 17:52   Result Date: 12/01/2019 CLINICAL DATA:  Abdominal pain, postoperative fevers EXAM: CT ABDOMEN AND PELVIS WITH CONTRAST TECHNIQUE: Multidetector CT imaging of the abdomen and  pelvis was performed using the standard protocol following bolus administration of intravenous contrast. CONTRAST:  OMNIPAQUE IOHEXOL 300 MG/ML  SOLN COMPARISON:  11/24/2019 FINDINGS: Lower chest: Basilar atelectasis. No consolidation or sign of pleural effusion. Hepatobiliary: No focal, suspicious hepatic lesion. No biliary duct distension. Numerous gallstones in the gallbladder without pericholecystic stranding. These layer within partially opacified bile perhaps from vicarious excretion of previously administered intravenous contrast. Portal vein is patent. Pancreas: Pancreas without ductal dilation or inflammation. Spleen: Spleen normal size and contour. Adrenals/Urinary Tract: Adrenal glands are normal. Patchy bilateral nephrogram  suggested in the bilateral kidneys. No hydronephrosis urinary bladder with thickening in the setting of persistent intra-abdominal fluid. Pelvic fluid sits atop the urinary bladder and is adjacent to the colonic pouch. Interval formation of a LEFT lower quadrant colostomy since the previous exam. Pelvic fluid measuring 6.5 x 3.2 x 2.8 cm. Stomach/Bowel: Stomach is moderately distended. Small bowel loops mildly dilated. Appendix is normal. Colonic diverticulosis. LEFT lower quadrant colostomy. Dilation of mid small bowel and proximal small bowel. Vascular/Lymphatic: Vascular structures in the abdomen are patent. No adenopathy. No pelvic adenopathy. Potential filling defect in the RIGHT superficial femoral vein on image 97 of series 3. Reproductive: Post hysterectomy. Other: Wound VAC in place over the anterior abdomen. Stranding in the rectus muscles following recent surgery. Superficial abscess has been evacuated. Musculoskeletal: No acute musculoskeletal process. No destructive bone finding. IMPRESSION: 1. Possible filling defect in the RIGHT superficial femoral vein, consider dedicated lower extremity venous evaluation to exclude the presence of venous thrombosis. This could  also represent mixing but appears abrupt, only seen on 2 images of the current exam. Post partial colonic resection and LEFT lower quadrant colostomy creation. 2. Partial small bowel obstruction versus ileus in the LEFT hemiabdomen, favor postoperative ileus. 3. Signs of pyelonephritis with striated nephrogram of bilateral kidneys. Correlation with urinalysis is suggested 4. Pelvic fluid measuring 6.5 x 3.2 x 2.8 cm. This is diminished in size compared to the previous study where it measured approximately 6.7 x 6.4 cm and was less well-defined in contain more debris. This is compatible with residual abscess. Other scattered foci of fluid and inflammation are similarly diminished in size. 5. Patchy bilateral nephrogram suggested in the bilateral kidneys. Correlate with any clinical or laboratory evidence of pyelonephritis. 6. Cholelithiasis without evidence of acute cholecystitis. Call is out to the referring provider to further discuss findings in the above case. Electronically Signed: By: Donzetta Kohut M.D. On: 12/01/2019 17:38   CT ABDOMEN PELVIS W CONTRAST  Result Date: 11/24/2019 CLINICAL DATA:  Lower abdominal pain. Patient reports no output from drainage catheters. EXAM: CT ABDOMEN AND PELVIS WITH CONTRAST TECHNIQUE: Multidetector CT imaging of the abdomen and pelvis was performed using the standard protocol following bolus administration of intravenous contrast. CONTRAST:  OMNIPAQUE IOHEXOL 300 MG/ML  SOLN COMPARISON:  11/14/2019 FINDINGS: Lower chest: No acute abnormality. Hepatobiliary: Stable subcentimeter low-density lesion within the left hepatic lobe, which remains too small to definitively characterize. Liver is otherwise unremarkable. Gallbladder is moderately distended. No hyperdense gallstone. No biliary dilatation. Pancreas: Unremarkable. No pancreatic ductal dilatation or surrounding inflammatory changes. Spleen: Normal in size without focal abnormality. Adrenals/Urinary Tract:  Unremarkable adrenal glands. Kidneys enhance symmetrically. There is mild bilateral hydronephrosis. No renal or ureteral calculi are seen. Bladder is moderately distended with thickening of the wall at the bladder dome. No intraluminal air within the bladder. Stomach/Bowel: Stomach and small bowel are within normal limits. No bowel obstruction. Changes of chronic diverticulitis within the sigmoid colon with numerous fistulous connections between the sigmoid colon and abscess cavity along the dome of the urinary bladder (series 6, images 54-67). Vascular/Lymphatic: No significant vascular findings are present. No enlarged abdominal or pelvic lymph nodes. Reproductive: Status post hysterectomy. No adnexal masses. Other: Significantly increased size of rim enhancing fluid and air containing abscess within the soft tissues of the anterior abdominal wall. Collection contains a pigtail drainage catheter located along the more superior aspect of this collection. Collection in total measures approximately 20.8 x 8.0 x 13.0 cm (series 3, image 71;  series 7, image 81), previously measured 7.6 x 4.1 x 5.8 cm. Additional abscess along the dome of the bladder measures approximately 8.1 x 4.2 x 6.4 cm (series 7, image 83; series 3, image 62) previously measured 7.5 x 3.4 x 3.7 cm. Pigtail catheter is present within the largest pocket. There are additional smaller abscess pockets insinuated around the sigmoid colon (for example series 3, image 55 and series 6, image 66). Musculoskeletal: No acute or significant osseous findings. IMPRESSION: 1. Significantly increased size of large abscess within the soft tissues of the lower anterior abdominal wall now measuring up to approximately 21 cm, previously 8 cm. Collection contains a pigtail drainage catheter located along the more superior aspect of this collection. 2. Additional catheter containing abscess along the dome of the bladder measures up to 8.1 cm, previously measured 7.5 cm.  3. Changes of chronic diverticulitis within the sigmoid colon with numerous fistulous connections between the sigmoid colon and abscess cavity along the dome of the urinary bladder. 4. Mild bilateral hydronephrosis. Electronically Signed   By: Duanne Guess D.O.   On: 11/24/2019 13:21   CT ABDOMEN PELVIS W CONTRAST  Result Date: 11/14/2019 CLINICAL DATA:  Intra-abdominal abscess. JP drain not draining well, accumulation of drainage on the T-drain sponge. EXAM: CT ABDOMEN AND PELVIS WITH CONTRAST TECHNIQUE: Multidetector CT imaging of the abdomen and pelvis was performed using the standard protocol following bolus administration of intravenous contrast. CONTRAST:  OMNIPAQUE IOHEXOL 300 MG/ML  SOLN COMPARISON:  Multiple exams, including 11/02/2019 FINDINGS: Lower chest: Unremarkable Hepatobiliary: Heterogeneity in the gallbladder could reflect gallstones or sludge. 0.7 by 0.4 cm hypodense lesion in the lateral segment left hepatic lobe on image 12/2 is technically too small to characterize although statistically likely to be a benign cyst or similar benign lesion. No significant biliary dilatation. Pancreas: Unremarkable Spleen: Unremarkable Adrenals/Urinary Tract: The kidneys and ureters appear unremarkable. There is an abscess along the upper margin of the urinary bladder as noted below. Stomach/Bowel: There is considerable descending and sigmoid colon diverticulosis. There is an apparent fistulous connection between the sigmoid colon and the abscess collection above the urinary bladder shown on image 95/5. There is also local inflammation in this vicinity such that active diverticulitis may be persisting. Likewise, cannot exclude a second fistulous connection between the proximal sigmoid colon and the abscess collection above the urinary bladder eccentric to the left on image 106/5. There are 2 dominant abscesses in this vicinity. The lesion just above the urinary bladder measures proximally 7.6 by 3.4  by 3.7 cm (volume = 50 cm^3), within this abscess there is coiled a pigtail drain is coiled. A second lesion is present in the subcutaneous tissues just anterior to the linea alba, and this second abscess also contains a pigtail catheter and is traversed by the first pigtail catheter is well. This second abscess measures 7.6 by 4.2 by 5.8 cm (volume = 97 cm^3) and contains fluid and gas. There is surrounding inflammatory phlegmon in the subcutaneous adipose tissues. Small amount of anterior pelvic ascites noted bilaterally, but without obvious enhancing margins to suggest that these represent additional abscesses. No discontinuity or obvious kinking of the intra-portions the drainage catheters identified. Vascular/Lymphatic: Unremarkable Reproductive: Uterus absent. Other: No supplemental non-categorized findings. Musculoskeletal: Multilevel facet arthropathy in the lumbar spine, with suspected associated right foraminal impingement at L1-2 and L4-5, and left foraminal impingement at L1-2, L3-4, likely L4-5. IMPRESSION: 1. A 50 cubic cm abscess just above the urinary bladder has at least 1  and possibly 2 fistulous connections to the inflamed sigmoid colon. Descending and sigmoid colon diverticulosis. A pigtail catheter is present with loop formed within this abscess. 2. A 97 cubic cm abscess is present in the midline subcutaneous tissues of the anterior pelvis, with surrounding inflammatory stranding in the subcutaneous tissues indicating phlegmon. A pigtail catheter is present with loop formed within this abscess. The other pigtail catheter traverses this abscess to reach the abscess above the urinary bladder. 3. Small amounts of ascites anteriorly in the pelvis, without marginal enhancement to suggest that these collections represent new abscesses. 4. Heterogeneity in the gallbladder could reflect gallstones or sludge. 5. Multilevel facet arthropathy in the lumbar spine with associated foraminal impingement.  Electronically Signed   By: Gaylyn Rong M.D.   On: 11/14/2019 20:56   CT ABDOMEN PELVIS W CONTRAST  Result Date: 11/02/2019 CLINICAL DATA:  Abdominal pain. EXAM: CT ABDOMEN AND PELVIS WITH CONTRAST TECHNIQUE: Multidetector CT imaging of the abdomen and pelvis was performed using the standard protocol following bolus administration of intravenous contrast. CONTRAST:  OMNIPAQUE IOHEXOL 300 MG/ML  SOLN COMPARISON:  CT abdomen dated 10/05/2012 FINDINGS: Lower chest: No acute abnormality. Hepatobiliary: No focal liver abnormality is seen. No gallstones, gallbladder wall thickening, or biliary dilatation. Pancreas: Unremarkable. No pancreatic ductal dilatation or surrounding inflammatory changes. Spleen: Normal in size without focal abnormality. Adrenals/Urinary Tract: Adrenal glands appear normal. Kidneys appear normal without mass, stone or hydronephrosis. No obstructing ureteral or bladder calculi are identified. Stomach/Bowel: Marked thickening of the walls of the sigmoid colon indicating acute diverticulitis. Multiple abscess collections extending inferior and anterior to the affected segment of the sigmoid colon. These abscess collections are potentially multiloculated but the major components appear to be contiguous from just above the level of the bladder to the anterior abdominal wall, overall measuring approximately 12 cm AP extent (series 3, image 70). The component of the abscess collection within the anterior abdominal wall measures 7.2 x 3.2 x 5.8 cm (craniocaudal by AP by transverse dimensions) (series 3, image 71; coronal series 6, image 18). The most inferior component of the abscess collection abuts the dome of the bladder, and there is thickening of the underlying bladder wall, suggesting a developing colovesical fistula (axial series 3, images 70 through 75). Extensive diverticulosis throughout the colon, but no other site of acute/subacute diverticulitis. No bowel obstruction.  Vascular/Lymphatic: No significant vascular findings are present. No enlarged abdominal or pelvic lymph nodes. Reproductive: Presumed hysterectomy.  No adnexal mass or free fluid. Other: No free intraperitoneal air identified. Musculoskeletal: No acute or suspicious osseous finding. IMPRESSION: 1. Acute or subacute diverticulitis of the sigmoid colon. 2. Multiple associated abscess collections extending inferior and anterior to the affected segment of the sigmoid colon. These abscess collections are potentially multiloculated but the major components appear to be contiguous from just above the level of the bladder to the anterior abdominal wall, overall measuring approximately 12 cm AP extent. The component of the abscess collection within the anterior abdominal wall measures 7.2 x 3.2 x 5.8 cm. The most inferior component of the abscess collection abuts the dome of the bladder, and there is thickening of the underlying bladder wall, suggesting a developing colovesical fistula. Given the distortion of the sigmoid colon loops/walls, I suspect colocolic fistulas as well. 3. Extensive diverticulosis. 4. No free intraperitoneal air identified. Electronically Signed   By: Bary Richard M.D.   On: 11/02/2019 14:21   CT IMAGE GUIDED DRAINAGE BY PERCUTANEOUS CATHETER  Result Date: 11/04/2019 INDICATION:  54 year old female with a history of pelvic abscess, trans spatial EXAM: CT GUIDED DRAINAGE OF  ABSCESS MEDICATIONS: The patient is currently admitted to the hospital and receiving intravenous antibiotics. The antibiotics were administered within an appropriate time frame prior to the initiation of the procedure. ANESTHESIA/SEDATION: 1.0 mg IV Versed 50 mcg IV Fentanyl Moderate Sedation Time:  21 minutes The patient was continuously monitored during the procedure by the interventional radiology nurse under my direct supervision. COMPLICATIONS: None TECHNIQUE: Informed written consent was obtained from the patient after a  thorough discussion of the procedural risks, benefits and alternatives. All questions were addressed. Maximal Sterile Barrier Technique was utilized including caps, mask, sterile gowns, sterile gloves, sterile drape, hand hygiene and skin antiseptic. A timeout was performed prior to the initiation of the procedure. PROCEDURE: The operative field was prepped with Chlorhexidine in a sterile fashion, and a sterile drape was applied covering the operative field. A sterile gown and sterile gloves were used for the procedure. Local anesthesia was provided with 1% Lidocaine. Once the patient is prepped and draped in the usual sterile fashion, 1% lidocaine was used for local anesthesia. Two parallel 18 gauge trocar needles were advanced with CT guidance into the deep abscess and the superficial component of the abscess. Using modified Seldinger technique, a 12 French drain was placed into the deep abscess, and separately into the superficial abscess. Approximately 60 cc of purulent material aspirated from the deep abscess with a culture sent. Both catheters were sutured in position and attached to bulb suction drainage. Final CT was acquired. Patient tolerated the procedure well and remained hemodynamically stable throughout. No complications were encountered and no significant blood loss. FINDINGS: CT demonstrates a multi spatial abscess within the pelvis, similar to the comparison CT. After drainage placement, there is a 79 [place] drain from anterior midline approach to the deep component, and a tangential right of midline 12 French drain into the superficial component. Both are attached to bulb drainage. IMPRESSION: Status post CT-guided drainage with placement of 2 drainage catheters into a multi spatial abscess of the pelvis. Signed, Yvone Neu. Reyne Dumas, RPVI V[facility] Interventional Radiology Specialists [facility] Radiology Electronically Signed   By: Gilmer Mor D.O.   On: 11/04/2019 10:03   CT IMAGE GUIDED  DRAINAGE BY PERCUTANEOUS CATHETER  Result Date: 11/04/2019 INDICATION: 54 year old female with a history of pelvic abscess, trans spatial EXAM: CT GUIDED DRAINAGE OF  ABSCESS MEDICATIONS: The patient is currently admitted to the hospital and receiving intravenous antibiotics. The antibiotics were administered within an appropriate time frame prior to the initiation of the procedure. ANESTHESIA/SEDATION: 1.0 mg IV Versed 50 mcg IV Fentanyl Moderate Sedation Time:  21 minutes The patient was continuously monitored during the procedure by the interventional radiology nurse under my direct supervision. COMPLICATIONS: None TECHNIQUE: Informed written consent was obtained from the patient after a thorough discussion of the procedural risks, benefits and alternatives. All questions were addressed. Maximal Sterile Barrier Technique was utilized including caps, mask, sterile gowns, sterile gloves, sterile drape, hand hygiene and skin antiseptic. A timeout was performed prior to the initiation of the procedure. PROCEDURE: The operative field was prepped with Chlorhexidine in a sterile fashion, and a sterile drape was applied covering the operative field. A sterile gown and sterile gloves were used for the procedure. Local anesthesia was provided with 1% Lidocaine. Once the patient is prepped and draped in the usual sterile fashion, 1% lidocaine was used for local anesthesia. Two parallel 18 gauge trocar needles were  advanced with CT guidance into the deep abscess and the superficial component of the abscess. Using modified Seldinger technique, a 12 French drain was placed into the deep abscess, and separately into the superficial abscess. Approximately 60 cc of purulent material aspirated from the deep abscess with a culture sent. Both catheters were sutured in position and attached to bulb suction drainage. Final CT was acquired. Patient tolerated the procedure well and remained hemodynamically stable throughout. No  complications were encountered and no significant blood loss. FINDINGS: CT demonstrates a multi spatial abscess within the pelvis, similar to the comparison CT. After drainage placement, there is a 47 [place] drain from anterior midline approach to the deep component, and a tangential right of midline 12 French drain into the superficial component. Both are attached to bulb drainage. IMPRESSION: Status post CT-guided drainage with placement of 2 drainage catheters into a multi spatial abscess of the pelvis. Signed, Yvone Neu. Reyne Dumas, RPVI Vascular and Interventional Radiology Specialists [facility] Radiology Electronically Signed   By: Gilmer Mor D.O.   On: 11/04/2019 10:03   [place] EKG SITE RITE  Result Date: 11/25/2019 If Site Rite image not attached, placement could not be confirmed due to current cardiac rhythm.   Labs:  CBC: Recent Labs    11/27/19 1147 11/28/19 0352 11/29/19 0319 12/01/19 0320  WBC 28.1* 22.8* 23.4* 26.6*  HGB 5.7* 6.5* 5.7* 6.4*  HCT 16.8* 19.2* 17.1* 19.2*  PLT 322 431* 413* 519*    COAGS: Recent Labs    11/04/19 0428 11/24/19 0929  INR 1.4* 1.4*  APTT  --  36    BMP: Recent Labs    11/28/19 0352 11/29/19 0319 11/30/19 0305 12/01/19 0320  NA 144 139 133* 134*  K 3.1* 3.9 4.3 4.1  CL 106 105 102 101  CO2 29 27 26 26   GLUCOSE 135* 115* 122* 134*  BUN 11 12 14 13   CALCIUM 8.2* 8.2* 8.4* 8.6*  CREATININE 0.71 0.57 0.54 0.52  GFRNONAA >60 >60 >60 >60  GFRAA >60 >60 >60 >60    LIVER FUNCTION TESTS: Recent Labs    11/25/19 0432 11/26/19 0423 11/27/19 0433 12/01/19 0320  BILITOT 0.5 0.6 0.5 0.5  AST 11* 14* 13* 15  ALT 8 8 9 11   ALKPHOS 64 52 45 67  PROT 5.4* 5.4* 4.8* 5.5*  ALBUMIN 1.5* 1.8* 1.7* 2.1*    TUMOR MARKERS: No results for input(s): AFPTM, CEA, CA199, CHROMGRNA in the last 8760 hours.  Assessment and Plan:  Perforated diverticulitis with abdominal abscess, s/p open sigmoid colectomy and end colostomy, small bowel  resection and I&D of abdominal wall abscess; persistent pelvic fluid collection: 11/29/19, 54 year old female, is scheduled to be seen today, 12/02/19, at the [facility] Interventional Radiology department, for an image-guided pelvic fluid collection aspiration with possible drain placement.   Risks and benefits discussed with the patient including bleeding, infection, damage to adjacent structures, bowel perforation/fistula connection, and sepsis.  She has been NPO. Labs and vitals have been reviewed. Due to religious beliefs, [name] will not accept blood products. She has developed significant anemia and her most recent hemoglobin level is 6.4 (12/01/19).   All of the patient's questions were answered, patient is agreeable to proceed, however she expresses a desire to talk with her family/discuss the procedure with them before coming down to IR. She does not know when she will be able to speak to them. [name] understands this may delay her procedure until tomorrow.   Consent  signed and in chart.  Thank you for this interesting consult.  I greatly enjoyed meeting [name] and look forward to participating in their care.  A copy of this report was sent to the requesting provider on this date.  Electronically Signed: Alwyn Ren, AGACNP-BC [phone] 12/02/2019, 9:04 AM   I spent a total of 40 Minutes  in face to face in clinical consultation, greater than 50% of which was counseling/coordinating care for pelvic fluid collection aspiration with possible drain placement.

## 2019-12-02 NOTE — Progress Notes (Signed)
PHARMACY - TOTAL PARENTERAL NUTRITION CONSULT NOTE  Indication: Intolerance to enteral feeding  Patient Measurements: Height: 5' 6.5" (168.9 cm) Weight: 90 kg (198 lb 6.6 oz) IBW/kg (Calculated) : 60.44 TPN AdjBW (KG): 67.8 Body mass index is 31.55 kg/m.  Assessment:  7 YOF with recurrent diverticulitis who presented with worsening abdominal pain. She was recently admitted on 8/1 through 8/9 with complicated diverticulitis and multiple abscesses and underwent percutaneous drain x2 placement by IR on 8/3. CT abd on 8/23 showed numerous fistulous connections between the sigmoid colon and abscess cavity.  Planning ex-lap, partial colectomy, colostomy and I&D of abdominal wall abscess, so Pharmacy consulted to manage TPN due to poor enteral nutrition in setting of severe malnutrition.  Glucose / Insulin: A1c 5.5% - CBGs well controlled.  Used 13 units mSSI in past 24 hrs. Electrolytes: 8/30 labs - low Na, K 4.1, Mg 1.8 and 1gm given Renal: SCr < 1, BUN WNL LFTs / TGs:  LFTs / tbili / TG WNL  Prealbumin / albumin: BL prealbumin <5 up to 25.3 on 8/30, albumin 2.1, TG 200 (8/30) Intake / Output; MIVF: UOP 0.6 ml/kg/hr, drain 33mL, LBM 8/28 GI Imaging: none since TPN Surgeries / Procedures:  8/24: open sigmoid colectomy and end colostomy, SBR, I&D of abdominal wall abscess.  8/30 CT abd/pelvis ordered  Central access: PICC placed 11/26/19 TPN start date: 11/26/19   Nutritional Goals (per RD rec on 8/30): 2100-2300 kCal, 125-135g protein, >2.1L fluid per day  Current Nutrition:  TPN Advance to soft diet 8/29 > NPO 8/31 for possible IR drainage  Plan:  Continue half TPN per Surgery - TPN at 45 ml/hr provides 64g AA, 140g CHO and 31g ILE for a total of 1046 kCal, meeting 50% of patient needs Electrolytes in TPN: Na 77mEq/L, K 50mEq/L, Ca 81mEq/L, Mag 5 mEq/L, Phos 55mmol/L, Cl:Ac 1:1 - no change today Add standard MVI and trace elements to TPN Continue moderate SSI Q4H F/U AM labs,  clinical progress/resuming oral diet vs increase TPN back to goal rate  Pharmacy consulted for Zosyn dosing for IAI.  Start Zosyn EID 3.375gm IV Q8H.  Will sign off abx dosing and monitor along with TPN.  Nichoel Digiulio D. Laney Potash, PharmD, BCPS, BCCCP 12/02/2019, 8:20 AM

## 2019-12-02 NOTE — Progress Notes (Signed)
Lower extremity venous has been completed.   Preliminary results in CV Proc.   Blanch Media 12/02/2019 10:42 AM

## 2019-12-02 NOTE — Progress Notes (Signed)
Pt has spoken with her family and she would like to have the procedure done tomorrow. Alwyn Ren, [place] called and notified of this.  Will reorder soft diet and NPO p MN.

## 2019-12-02 NOTE — Progress Notes (Signed)
Ostomy Education:  Demonstrated to pt how to empty ostomy bag and clean the end of the bag and then she demonstrated closing, sealing the end of the bag, making sure to press it together and check it.

## 2019-12-02 NOTE — Progress Notes (Signed)
Called for pt to come to radiology for procedure. RN tells me that pt states she needs to speak with her family before coming down for procedure.

## 2019-12-02 NOTE — Progress Notes (Signed)
I spoke with the pt and she wishes to wait and speak with her family until she does the radiology procedure.

## 2019-12-02 NOTE — Progress Notes (Signed)
Patient requests to have IR procedure done 12/03/19. Patient will be NPO after midnight and IR will send for the patient tomorrow when there is availability.  Please call IR with any questions.  Alwyn Ren, [place] [phone] 12/02/2019, 2:08 PM

## 2019-12-02 NOTE — Progress Notes (Signed)
PT Cancellation Note  Patient Details Name: [name] MRN: [id number] DOB: 25-May-1965   Cancelled Treatment:    Reason Eval/Treat Not Completed: Medical issues which prohibited therapy  Pt had U/S of LE today that revealed "age indeterminate DVT" on L LE.  Spoke with RN and will hold PT today for medical management of newly discovered DVT.  Anise Salvo, PT Acute Rehab Services Pager [phone] [facility] [phone]    Rayetta Humphrey 12/02/2019, 12:55 PM

## 2019-12-03 ENCOUNTER — Inpatient Hospital Stay (HOSPITAL_COMMUNITY): Payer: Commercial Managed Care - PPO

## 2019-12-03 DIAGNOSIS — Z95828 Presence of other vascular implants and grafts: Secondary | ICD-10-CM

## 2019-12-03 HISTORY — PX: IR IVC FILTER PLMT / S&I /IMG GUID/MOD SED: IMG701

## 2019-12-03 LAB — BASIC METABOLIC PANEL
Anion gap: 9 (ref 5–15)
BUN: 11 mg/dL (ref 6–20)
CO2: 25 mmol/L (ref 22–32)
Calcium: 8.7 mg/dL — ABNORMAL LOW (ref 8.9–10.3)
Chloride: 98 mmol/L (ref 98–111)
Creatinine, Ser: 0.55 mg/dL (ref 0.44–1.00)
GFR calc Af Amer: >60 mL/min (ref >60)
GFR calc non Af Amer: >60 mL/min (ref >60)
Glucose, Bld: 109 mg/dL — ABNORMAL HIGH (ref 70–99)
Potassium: 3.8 mmol/L (ref 3.5–5.1)
Sodium: 132 mmol/L — ABNORMAL LOW (ref 135–145)

## 2019-12-03 LAB — URINALYSIS, COMPLETE (UACMP) WITH MICROSCOPIC
Bacteria, UA: NONE SEEN
Bilirubin Urine: NEGATIVE
Glucose, UA: NEGATIVE mg/dL
Hgb urine dipstick: NEGATIVE
Ketones, ur: NEGATIVE mg/dL
Leukocytes,Ua: NEGATIVE
Nitrite: NEGATIVE
Protein, ur: NEGATIVE mg/dL
Specific Gravity, Urine: 1.018 (ref 1.005–1.030)
pH: 5 (ref 5.0–8.0)

## 2019-12-03 LAB — GLUCOSE, CAPILLARY
Glucose-Capillary: 136 mg/dL — ABNORMAL HIGH (ref 70–99)
Glucose-Capillary: 137 mg/dL — ABNORMAL HIGH (ref 70–99)
Glucose-Capillary: 138 mg/dL — ABNORMAL HIGH (ref 70–99)
Glucose-Capillary: 138 mg/dL — ABNORMAL HIGH (ref 70–99)
Glucose-Capillary: 148 mg/dL — ABNORMAL HIGH (ref 70–99)
Glucose-Capillary: 162 mg/dL — ABNORMAL HIGH (ref 70–99)

## 2019-12-03 LAB — MAGNESIUM: Magnesium: 1.9 mg/dL (ref 1.7–2.4)

## 2019-12-03 LAB — URINE CULTURE: Culture: <10000 — AB

## 2019-12-03 MED ORDER — MIDAZOLAM HCL 2 MG/2ML IJ SOLN
INTRAMUSCULAR | Status: AC | PRN
  Administered 2019-12-03: 1 mg via INTRAVENOUS

## 2019-12-03 MED ORDER — FENTANYL CITRATE (PF) 100 MCG/2ML IJ SOLN
INTRAMUSCULAR | Status: AC
  Filled 2019-12-03: qty 2

## 2019-12-03 MED ORDER — MIDAZOLAM HCL 2 MG/2ML IJ SOLN
INTRAMUSCULAR | Status: AC
  Filled 2019-12-03: qty 2

## 2019-12-03 MED ORDER — FENTANYL CITRATE (PF) 100 MCG/2ML IJ SOLN
INTRAMUSCULAR | Status: AC | PRN
  Administered 2019-12-03: 50 ug via INTRAVENOUS

## 2019-12-03 MED ORDER — TRAVASOL 10 % IV SOLN
INTRAVENOUS | Status: AC
  Filled 2019-12-03: qty 637.2

## 2019-12-03 MED ORDER — LIDOCAINE HCL 1 % IJ SOLN
INTRAMUSCULAR | Status: AC
  Filled 2019-12-03: qty 20

## 2019-12-03 MED ORDER — LIDOCAINE HCL (PF) 1 % IJ SOLN
INTRAMUSCULAR | Status: AC | PRN
  Administered 2019-12-03: 5 mL

## 2019-12-03 NOTE — Progress Notes (Signed)
Nutrition Follow-up  DOCUMENTATION CODES:   Severe malnutrition in context of acute illness/injury  INTERVENTION:   -TPN management per pharmacy  Once diet is advanced:  -Continue Ensure Surgery BID, each supplement provides 330 kcals and 18 grams protein -Continue Magic cup TID with meals, each supplement provides 290 kcal and 9 grams of protein  NUTRITION DIAGNOSIS:   Severe Malnutrition related to acute illness (perforated diverticulitis with intra-abdominal abscess) as evidenced by mild fat depletion, moderate fat depletion, mild muscle depletion, moderate muscle depletion, percent weight loss.  Ongoing  GOAL:   Patient will meet greater than or equal to 90% of their needs  Progressing   MONITOR:   Diet advancement, Labs, Weight trends, Skin, I & O's  REASON FOR ASSESSMENT:   Consult New TPN/TNA  ASSESSMENT:   [name] is a 54yo female PMH HTN and recurrent diverticulitis who returned to the ED today with 2 days of worsening abdominal pain.  8/24- s/pPROCEDURE PERFORMED:Open sigmoid colectomy and end colostomy (Hartmann's procedure), small bowel resection, incision and drainage of abdominal wall abscess with debridement of skin and subcutaneous fatty tissue 8/25- s/p PICC, TPN initiated 8/26- advanced to clear liquid diet, abdominal wound vac placed 8/27- advanced to full liquid diet 8/29- advanced to soft diet  Reviewed I/O's: -101 ml x 24 hours and +1.1 L since admission  UOP: 1.7 L x 24 hours  Drain output: 20 ml x 24 hours  Colostomy output: 200 ml x 24 hours   Case discussed with RN; pt is NPO for IVC filter placement today. Pt with LLE DVTs.   Previously, pt on a soft diet and consuming 50% of meals. She enjoys and is drinking Ensure supplements.   Pt remains on TPN- currently receiving at 45 ml/hr, which provides 1046 kcals and 64 grams protein,  Labs reviewed: CBGS: 137-148 (inpatient orders for glycmeic control are 0-15 units insulin  aspart every 4 hours).   Diet Order:   Diet Order            Diet NPO time specified  Diet effective midnight                 EDUCATION NEEDS:   Education needs have been addressed  Skin:  Skin Assessment: Skin Integrity Issues: Skin Integrity Issues:: Wound VAC Wound Vac: abdomen Incisions: closed abdomen  Last BM:  12/02/19 (200 ml via colostomy)  Height:   Ht Readings from Last 1 Encounters:  11/25/19 5' 6.5" (1.689 m)    Weight:   Wt Readings from Last 1 Encounters:  11/25/19 90 kg    Ideal Body Weight:  60.2 kg  BMI:  Body mass index is 31.55 kg/m.  Estimated Nutritional Needs:   Kcal:  2100-2300  Protein:  120-135 grams  Fluid:  > 2.1 L    Levada Schilling, RD, LDN, CDCES Registered Dietitian II Certified Diabetes Care and Education Specialist Please refer to [facility] for RD and/or RD on-call/weekend/after hours pager

## 2019-12-03 NOTE — Progress Notes (Signed)
After consult yesterday for IR drain placement patient found to have left lower extremity DVTs. IR requested to also place IVC filter while patient is in IR for drain procedure. Procedure approved by Dr. Fredia Sorrow, patient in agreement to proceed.   Risks and benefits discussed with the patient including, but not limited to bleeding, infection, contrast induced renal failure, filter fracture or migration which can lead to emergency surgery or even death, strut penetration with damage or irritation to adjacent structures and caval thrombosis.  All of the patient's questions were answered, patient is agreeable to proceed.  Consent signed and in chart.  Alwyn Ren, [place] [phone] 12/03/2019, 1:07 PM

## 2019-12-03 NOTE — Sedation Documentation (Signed)
Per verbal orders Dr Fredia Sorrow, procedure not to be done.  Verbal orders read back and verified.  Please see notes

## 2019-12-03 NOTE — Progress Notes (Signed)
PT Cancellation Note  Patient Details Name: [name] MRN: [id number] DOB: 1965-05-26   Cancelled Treatment:    Reason Eval/Treat Not Completed: (P) Medical issues which prohibited therapy (Pt just back from procedure and has not been assessed by RN, RN deferred tx at this time will f/u per POC.  HGB remains low as well.)   Teaghan Melrose [name] 12/03/2019, 4:50 PM  [name] , PTA Acute Rehabilitation Services Pager [phone] Office [phone]

## 2019-12-03 NOTE — Consult Note (Signed)
WOC Nurse wound follow up Wound type:midline abdominal surgical wound with NPWT VAC therapy.  LUQ end colostomy Measurement: 14 cm x 15 cm x 4.5 cm Tunneling remains at distal end, 5 and 7 o'clock  Both are noted to be 5 cm  Wound FUX:NATFTD adipose Drainage (amount, consistency, odor) moderate serosanguinous  No odor.  Periwound:LUQ colostomy Dressing procedure/placement/frequency: 3 pieces black foam removed and 3 replaced.  Covered with drape.  Seal achieved.  Change Mon/Wed/Fri.   WOC Nurse ostomy follow up Stoma type/location: LUQ colostomy Stomal assessment/size: 1 3/8" Peristomal assessment: creasing remains.  Added barrier ring Treatment options for stomal/peristomal skin: barrier ring Output soft brown stool Ostomy pouching: 1pc. convex Education provided: Patient is interested in beginning ostomy care. We measure the stoma and patient cuts the opening out.  I apply the barrier ring and patient applied pouch.  She demonstrated roll closure.  I explained cleaning the pouch opening with toilet paper "cigar" and she demonstrates this.  She is learning the principles of ostomy care slowly  Enrolled patient in [place] Secure Start Discharge program: No   Will follow. ,  Maple Hudson RN FNP-BC CWON Wound, Ostomy, Continence Nurse Pager [phone]

## 2019-12-03 NOTE — Progress Notes (Signed)
PHARMACY - TOTAL PARENTERAL NUTRITION CONSULT NOTE  Indication: Intolerance to enteral feeding  Patient Measurements: Height: 5' 6.5" (168.9 cm) Weight: 90 kg (198 lb 6.6 oz) IBW/kg (Calculated) : 60.44 TPN AdjBW (KG): 67.8 Body mass index is 31.55 kg/m.  Assessment:  33 YOF with recurrent diverticulitis who presented with worsening abdominal pain. She was recently admitted on 8/1 through 8/9 with complicated diverticulitis and multiple abscesses and underwent percutaneous drain x2 placement by IR on 8/3. CT abd on 8/23 showed numerous fistulous connections between the sigmoid colon and abscess cavity.  Planning ex-lap, partial colectomy, colostomy and I&D of abdominal wall abscess, so Pharmacy consulted to manage TPN due to poor enteral nutrition in setting of severe malnutrition.  Glucose / Insulin: A1c 5.5% - CBGs well controlled.  Used 9 units mSSI in past 24 hrs. Electrolytes: Na 132, others WNL (lytes trending down with 1/2 TPN) Renal: SCr < 1, BUN WNL LFTs / TGs:  LFTs / tbili / TG WNL  Prealbumin / albumin: BL prealbumin <5 up to 25.3 on 8/30, albumin 2.1, TG 200 (8/30) Intake / Output; MIVF: UOP 0.8 ml/kg/hr, drain 52mL, stool ID: Zosyn (8/31 >> ) for IAI, possible pyelonephritis, abscess - afebrile, WBC WNL GI Imaging:  8/30 CT - pSBO vs ileus, pelvic fluid, abscess, cholelithiasis Surgeries / Procedures:  8/24: open sigmoid colectomy and end colostomy, SBR, I&D of abdominal wall abscess.   Central access: PICC placed 11/26/19 TPN start date: 11/26/19   Nutritional Goals (per RD rec on 8/30): 2100-2300 kCal, 125-135g protein, >2.1L fluid per day  Current Nutrition:  TPN (1/2 TPN since 8/30) Ensure Surgery BID (1 charted given) Advance to soft diet 8/29 > NPO 8/31 for possible IR drainage, delayed until 9/1  Plan:  Continue half TPN per Surgery - TPN at 45 ml/hr provides 64g AA, 140g CHO and 31g ILE for a total of 1046 kCal, meeting 50% of patient  needs Electrolytes in TPN: incr Na 134mEq/L, incr K 51mEq/L, Ca 70mEq/L, incr Mag 44mEq/L, Phos 54mmol/L, Cl:Ac 1:1 Add standard MVI and trace elements to TPN Continue moderate SSI Q4H Standard TPN labs on Mon and Thurs F/U clinical progress/resuming oral diet vs increase TPN back to goal rate   Nary Sneed D. Laney Potash, PharmD, BCPS, BCCCP 12/03/2019, 8:49 AM

## 2019-12-03 NOTE — Procedures (Signed)
Interventional Radiology Procedure Note  Procedure: IVC filter placement  Complications: None  Estimated Blood Loss: < 10 mL  Access: Right IJ vein  Findings: IVC normally patent and normal in caliber. Bard Stratton IVC filter placed in infrarenal IVC and well positioned.  Jodi Marble. Fredia Sorrow, M.D Pager:  [phone]

## 2019-12-03 NOTE — Sedation Documentation (Signed)
No report given for this procedure.  Patient still under care of reporting RN.  Patient to be transported via stretcher with reporting RN for additional procedure.

## 2019-12-03 NOTE — Progress Notes (Signed)
8 Days Post-Op  Subjective: CC: Patient reports abdominal pain is stable from yesterday in her epigastrium and left abdomen. On soft diet but only had broth and liquids yesterday. No n/v. Having colostomy output. Did not walk in the halls yesterday.   Objective: Vital signs in last 24 hours: Temp:  [98.2 F (36.8 C)-98.8 F (37.1 C)] 98.2 F (36.8 C) (09/01 0414) Pulse Rate:  [107-108] 107 (09/01 0414) Resp:  [18] 18 (09/01 0414) BP: (106-108)/(66-70) 106/67 (09/01 0414) SpO2:  [99 %-100 %] 99 % (09/01 0414) Last BM Date: 12/02/19  Intake/Output from previous day: 08/31 0701 - 09/01 0700 In: 1769.2 [P.O.:720; I.V.:951; IV Piggyback:98.2] Out: 1870 [Urine:1650; Drains:20; Stool:200] Intake/Output this shift: No intake/output data recorded.  PE: Gen: Alert, NAD, pleasant HEENT: EOM's intact, pupils equal and round Card: RRR Pulm: CTAB, no W/R/R, effort normal. Off o2 Abd: Soft,mild distension, there is tenderness of theleftabdomenand epigastriumthat is stable from yesterday.No peritonitis.+BS. Colostomy in place. There is somebrown stool in the bag.Unable to see stoma passed stool.Midline wound with vac in place withthick, bloody/purulentoutput with sedimentin cannister. Will see with WOCN today.  Ext: No LE edemaor tenderness.  Psych: A&Ox3  Skin:No rashes noted, warm and dry  Lab Results:  Recent Labs    12/01/19 0320  WBC 26.6*  HGB 6.4*  HCT 19.2*  PLT 519*   BMET Recent Labs    12/01/19 0320 12/03/19 0331  NA 134* 132*  K 4.1 3.8  CL 101 98  CO2 26 25  GLUCOSE 134* 109*  BUN 13 11  CREATININE 0.52 0.55  CALCIUM 8.6* 8.7*   PT/INR No results for input(s): LABPROT, INR in the last 72 hours. CMP     Component Value Date/Time   NA 132 (L) 12/03/2019 0331   K 3.8 12/03/2019 0331   CL 98 12/03/2019 0331   CO2 25 12/03/2019 0331   GLUCOSE 109 (H) 12/03/2019 0331   BUN 11 12/03/2019 0331   CREATININE 0.55 12/03/2019 0331    CALCIUM 8.7 (L) 12/03/2019 0331   PROT 5.5 (L) 12/01/2019 0320   ALBUMIN 2.1 (L) 12/01/2019 0320   AST 15 12/01/2019 0320   ALT 11 12/01/2019 0320   ALKPHOS 67 12/01/2019 0320   BILITOT 0.5 12/01/2019 0320   GFRNONAA >60 12/03/2019 0331   GFRAA >60 12/03/2019 0331   Lipase  No results found for: LIPASE     Studies/Results: CT ABDOMEN PELVIS W CONTRAST  Addendum Date: 12/01/2019   ADDENDUM REPORT: 12/01/2019 17:52 ADDENDUM: These results were called by telephone at the time of interpretation on 12/01/2019 at 5:52 pm to provider Dr. Derrell Lolling, who verbally acknowledged these results. Electronically Signed   By: Donzetta Kohut M.D.   On: 12/01/2019 17:52   Result Date: 12/01/2019 CLINICAL DATA:  Abdominal pain, postoperative fevers EXAM: CT ABDOMEN AND PELVIS WITH CONTRAST TECHNIQUE: Multidetector CT imaging of the abdomen and pelvis was performed using the standard protocol following bolus administration of intravenous contrast. CONTRAST:  OMNIPAQUE IOHEXOL 300 MG/ML  SOLN COMPARISON:  11/24/2019 FINDINGS: Lower chest: Basilar atelectasis. No consolidation or sign of pleural effusion. Hepatobiliary: No focal, suspicious hepatic lesion. No biliary duct distension. Numerous gallstones in the gallbladder without pericholecystic stranding. These layer within partially opacified bile perhaps from vicarious excretion of previously administered intravenous contrast. Portal vein is patent. Pancreas: Pancreas without ductal dilation or inflammation. Spleen: Spleen normal size and contour. Adrenals/Urinary Tract: Adrenal glands are normal. Patchy bilateral nephrogram suggested in the bilateral kidneys. No hydronephrosis  urinary bladder with thickening in the setting of persistent intra-abdominal fluid. Pelvic fluid sits atop the urinary bladder and is adjacent to the colonic pouch. Interval formation of a LEFT lower quadrant colostomy since the previous exam. Pelvic fluid measuring 6.5 x 3.2 x 2.8 cm.  Stomach/Bowel: Stomach is moderately distended. Small bowel loops mildly dilated. Appendix is normal. Colonic diverticulosis. LEFT lower quadrant colostomy. Dilation of mid small bowel and proximal small bowel. Vascular/Lymphatic: Vascular structures in the abdomen are patent. No adenopathy. No pelvic adenopathy. Potential filling defect in the RIGHT superficial femoral vein on image 97 of series 3. Reproductive: Post hysterectomy. Other: Wound VAC in place over the anterior abdomen. Stranding in the rectus muscles following recent surgery. Superficial abscess has been evacuated. Musculoskeletal: No acute musculoskeletal process. No destructive bone finding. IMPRESSION: 1. Possible filling defect in the RIGHT superficial femoral vein, consider dedicated lower extremity venous evaluation to exclude the presence of venous thrombosis. This could also represent mixing but appears abrupt, only seen on 2 images of the current exam. Post partial colonic resection and LEFT lower quadrant colostomy creation. 2. Partial small bowel obstruction versus ileus in the LEFT hemiabdomen, favor postoperative ileus. 3. Signs of pyelonephritis with striated nephrogram of bilateral kidneys. Correlation with urinalysis is suggested 4. Pelvic fluid measuring 6.5 x 3.2 x 2.8 cm. This is diminished in size compared to the previous study where it measured approximately 6.7 x 6.4 cm and was less well-defined in contain more debris. This is compatible with residual abscess. Other scattered foci of fluid and inflammation are similarly diminished in size. 5. Patchy bilateral nephrogram suggested in the bilateral kidneys. Correlate with any clinical or laboratory evidence of pyelonephritis. 6. Cholelithiasis without evidence of acute cholecystitis. Call is out to the referring provider to further discuss findings in the above case. Electronically Signed: By: Donzetta Kohut M.D. On: 12/01/2019 17:38   VAS [place] LOWER EXTREMITY VENOUS  (DVT)  Result Date: 12/02/2019  Lower Venous DVTStudy Indications: Filling defect on CT.  Comparison Study: no prior Performing Technologist: Blanch Media RVS  Examination Guidelines: A complete evaluation includes B-mode imaging, spectral Doppler, color Doppler, and power Doppler as needed of all accessible portions of each vessel. Bilateral testing is considered an integral part of a complete examination. Limited examinations for reoccurring indications may be performed as noted. The reflux portion of the exam is performed with the patient in reverse Trendelenburg.  +---------+---------------+---------+-----------+----------+--------------+ RIGHT    CompressibilityPhasicitySpontaneityPropertiesThrombus Aging +---------+---------------+---------+-----------+----------+--------------+ CFV      Full           Yes      Yes                                 +---------+---------------+---------+-----------+----------+--------------+ SFJ      Full                                                        +---------+---------------+---------+-----------+----------+--------------+ FV Prox  Full                                                        +---------+---------------+---------+-----------+----------+--------------+  FV Mid   Full                                                        +---------+---------------+---------+-----------+----------+--------------+ FV DistalFull                                                        +---------+---------------+---------+-----------+----------+--------------+ PFV      Full                                                        +---------+---------------+---------+-----------+----------+--------------+ POP      Full           Yes      Yes                                 +---------+---------------+---------+-----------+----------+--------------+ PTV      Full                                                         +---------+---------------+---------+-----------+----------+--------------+ PERO     Full                                                        +---------+---------------+---------+-----------+----------+--------------+   +---------+---------------+---------+-----------+----------+-----------------+ LEFT     CompressibilityPhasicitySpontaneityPropertiesThrombus Aging    +---------+---------------+---------+-----------+----------+-----------------+ CFV      Full           Yes      Yes                                    +---------+---------------+---------+-----------+----------+-----------------+ SFJ      Full                                                           +---------+---------------+---------+-----------+----------+-----------------+ FV Prox  Full                                                           +---------+---------------+---------+-----------+----------+-----------------+ FV Mid   Full                                                           +---------+---------------+---------+-----------+----------+-----------------+  FV DistalFull                                                           +---------+---------------+---------+-----------+----------+-----------------+ PFV      Full                                                           +---------+---------------+---------+-----------+----------+-----------------+ POP      None           Yes      Yes                  Age Indeterminate +---------+---------------+---------+-----------+----------+-----------------+ PTV      Partial                                      Age Indeterminate +---------+---------------+---------+-----------+----------+-----------------+ PERO     Full                                                           +---------+---------------+---------+-----------+----------+-----------------+     Summary: RIGHT: - There is no evidence of deep vein  thrombosis in the lower extremity.  - No cystic structure found in the popliteal fossa.  LEFT: - Findings consistent with age indeterminate deep vein thrombosis involving the left popliteal vein, and left posterior tibial veins. - No cystic structure found in the popliteal fossa.  *See table(s) above for measurements and observations. Electronically signed by Coral Else MD on 12/02/2019 at 8:51:41 PM.    Final     Anti-infectives: Anti-infectives (From admission, onward)   Start     Dose/Rate Route Frequency Ordered Stop   12/02/19 0900  piperacillin-tazobactam (ZOSYN) IVPB 3.375 g        3.375 g 12.5 mL/hr over 240 Minutes Intravenous Every 8 hours 12/02/19 0824     11/25/19 1700  fluconazole (DIFLUCAN) IVPB 400 mg        400 mg 100 mL/hr over 120 Minutes Intravenous Every 24 hours 11/25/19 1649 11/29/19 2356   11/24/19 2000  vancomycin (VANCOCIN) IVPB 1000 mg/200 mL premix  Status:  Discontinued        1,000 mg 200 mL/hr over 60 Minutes Intravenous Every 8 hours 11/24/19 1111 11/25/19 1649   11/24/19 1400  piperacillin-tazobactam (ZOSYN) IVPB 3.375 g        3.375 g 12.5 mL/hr over 240 Minutes Intravenous Every 8 hours 11/24/19 1355 11/29/19 2030   11/24/19 1015  ceFEPIme (MAXIPIME) 2 g in sodium chloride 0.9 % 100 mL IVPB        2 g 200 mL/hr over 30 Minutes Intravenous  Once 11/24/19 1003 11/24/19 1130   11/24/19 1015  metroNIDAZOLE (FLAGYL) IVPB 500 mg        500 mg 100 mL/hr over 60 Minutes Intravenous  Once 11/24/19 1003 11/24/19 1400   11/24/19 0945  vancomycin (VANCOCIN) IVPB 1000 mg/200  mL premix  Status:  Discontinued        1,000 mg 200 mL/hr over 60 Minutes Intravenous  Once 11/24/19 0931 11/24/19 0939   11/24/19 0945  vancomycin (VANCOREADY) IVPB 1500 mg/300 mL        1,500 mg 150 mL/hr over 120 Minutes Intravenous  Once 11/24/19 0939 11/24/19 1418       Assessment/Plan HTN - home med ABLAnemia - Hgb7.5> 5.7 > 6.5> 5.7> 6.4 (8/30).Patient states that due to  religious beliefs she would still not want any blood products and understands the risks.Pediatric tube blood draws only to limit loss. We will hold off on daily blood draws to limit loss.Cont IV Ferumoxytol, and Aranesp Code status - DNR/DNI. Note from 8/26 "We discussed that given patients dropping hgb, she is at increased risk for decompensation that could lead to a cardiac event or death. We discussed code status in detail. Patient wishes to be DNR/DNI. We filled out a MOST form that was signed by the patient and placed in her paper chart as well as scanned into the media section. Her code status was changed on electronic records as well." Malnutrition - prealbumin <5to 25.3, on 1/2 TPN L popliteal vein and left posterior tibial veins - Seen by Dr. Myra Gianotti of vascular. Plans for IR to place IVC filter today Possible Pyelonephritis on CT - UA negative. Await UCx  Perforated diverticulitis with w/feculent peritonitisand abdominal wall abscess - s/p open sigmoid colectomy and end colostomy (Hartmann's procedure), small bowel resection, incision and drainage of abdominal wall abscess with debridement of skin and subcutaneous fatty tissueby Dr. Donell Beers 8/24 - POD #8 - CT 8/30 with Pelvic fluid measuring 6.5 x 3.2 x 2.8 cm. On IV abx. IR to place drain today -Cont 1/2TPN today. On soft diet but has not taken in solid food. Will wean off once intake improves.  - WOCN following for new ostomyand wound vac.Change m/w/f. Output from vac purulent - Mobilize. PT/OT.Recommending HH - Pulm toilet, IS - Path without evidence of malignancy. As noted below  ID -[email or web address]/[email or web address].vancomycin8/23.Zosyn 8/23- 8/28.Diflucan 8/24-8/28. Zosyn 8/31 >>  VTE -SCDs,hold lovenox2/2 anemia FEN -NPO for IR procedure. After, can go back on soft +ensure. Cont 1/2TPN (will consider weaning TPN off once tolerating soft diet) Foley -Dc'd 8/26 Follow up -Dr. Donell Beers  Path A. COLON, SIGMOID,  RESECTION:  - Segment of colon (25 cm) showing diverticulosis, diverticulitis with  associated abscesses, serositis and serosal adhesions  - Serosal adhesions to portion of benign ovary  - Endometriosis  - Margins appear viable   B. SMALL BOWEL, SEGMENT OF ILEUM, RESECTION:  - Segment of small intestine (11.5 cm) with serositis and serosal  adhesions  - Margins appear viable    LOS: 9 days    [name] , [facility] 12/03/2019, 8:03 AM Please see Amion for pager number during day hours 7:00am-4:30pm

## 2019-12-03 NOTE — Progress Notes (Signed)
Interventional Radiology Progress Note  Patient scanned in CT prior to, and following, Foley catheter placement for bladder decompression. The midline pelvic collection remains in a fixed position below abdominopelvic wound and wound vac and there is no good window for drainage without going through wound. More lateral approach risks traversing inferior epigastric artery/vein.   After discussion with [name], she would rather not try to drain the collection now and prefers to wait to see what her options are. Does appear that some of this may be draining via wound vac currently. Consider follow up CT in few days.  [name]. [name], M.D Pager:  [phone]

## 2019-12-04 LAB — PHOSPHORUS: Phosphorus: 3.8 mg/dL (ref 2.5–4.6)

## 2019-12-04 LAB — COMPREHENSIVE METABOLIC PANEL
ALT: 14 U/L (ref 0–44)
AST: 20 U/L (ref 15–41)
Albumin: 2.1 g/dL — ABNORMAL LOW (ref 3.5–5.0)
Alkaline Phosphatase: 86 U/L (ref 38–126)
Anion gap: 8 (ref 5–15)
BUN: 12 mg/dL (ref 6–20)
CO2: 26 mmol/L (ref 22–32)
Calcium: 8.4 mg/dL — ABNORMAL LOW (ref 8.9–10.3)
Chloride: 99 mmol/L (ref 98–111)
Creatinine, Ser: 0.61 mg/dL (ref 0.44–1.00)
GFR calc Af Amer: >60 mL/min (ref >60)
GFR calc non Af Amer: >60 mL/min (ref >60)
Glucose, Bld: 146 mg/dL — ABNORMAL HIGH (ref 70–99)
Potassium: 3.7 mmol/L (ref 3.5–5.1)
Sodium: 133 mmol/L — ABNORMAL LOW (ref 135–145)
Total Bilirubin: 1.1 mg/dL (ref 0.3–1.2)
Total Protein: 5.8 g/dL — ABNORMAL LOW (ref 6.5–8.1)

## 2019-12-04 LAB — GLUCOSE, CAPILLARY
Glucose-Capillary: 118 mg/dL — ABNORMAL HIGH (ref 70–99)
Glucose-Capillary: 132 mg/dL — ABNORMAL HIGH (ref 70–99)
Glucose-Capillary: 144 mg/dL — ABNORMAL HIGH (ref 70–99)
Glucose-Capillary: 145 mg/dL — ABNORMAL HIGH (ref 70–99)
Glucose-Capillary: 146 mg/dL — ABNORMAL HIGH (ref 70–99)
Glucose-Capillary: 147 mg/dL — ABNORMAL HIGH (ref 70–99)
Glucose-Capillary: 153 mg/dL — ABNORMAL HIGH (ref 70–99)

## 2019-12-04 LAB — MAGNESIUM: Magnesium: 1.8 mg/dL (ref 1.7–2.4)

## 2019-12-04 MED ORDER — TRAVASOL 10 % IV SOLN
INTRAVENOUS | Status: AC
  Filled 2019-12-04: qty 636

## 2019-12-04 NOTE — Progress Notes (Signed)
  Progress Note    12/04/2019 7:43 AM 9 Days Post-Op  Subjective:  No complaints   Vitals:   12/03/19 1957 12/04/19 0405  BP: (!) 101/58 112/74  Pulse: (!) 106 (!) 105  Resp: 18 18  Temp: 99.1 F (37.3 C) 99 F (37.2 C)  SpO2: 99% 99%   Physical Exam: Cardiac: tachycardic Lungs: non labored Extremities:  Palpable DP and PT pulses bilaterally. Feet warm. Motor and sensory intact. No swelling or edema. No signs of cellitis Neurologic: alert and oriented  CBC    Component Value Date/Time   WBC 26.6 (H) 12/01/2019 0320   RBC 2.62 (L) 12/01/2019 0320   HGB 6.4 (LL) 12/01/2019 0320   HCT 19.2 (L) 12/01/2019 0320   PLT 519 (H) 12/01/2019 0320   MCV 73.3 (L) 12/01/2019 0320   MCH 24.4 (L) 12/01/2019 0320   MCHC 33.3 12/01/2019 0320   RDW 20.1 (H) 12/01/2019 0320   LYMPHSABS 3.8 12/01/2019 0320   MONOABS 1.2 (H) 12/01/2019 0320   EOSABS 0.5 12/01/2019 0320   BASOSABS 0.1 12/01/2019 0320    BMET    Component Value Date/Time   NA 133 (L) 12/04/2019 0335   K 3.7 12/04/2019 0335   CL 99 12/04/2019 0335   CO2 26 12/04/2019 0335   GLUCOSE 146 (H) 12/04/2019 0335   BUN 12 12/04/2019 0335   CREATININE 0.61 12/04/2019 0335   CALCIUM 8.4 (L) 12/04/2019 0335   GFRNONAA >60 12/04/2019 0335   GFRAA >60 12/04/2019 0335    INR    Component Value Date/Time   INR 1.4 (H) 11/24/2019 0929     Intake/Output Summary (Last 24 hours) at 12/04/2019 0743 Last data filed at 12/04/2019 0700 Gross per 24 hour  Intake 50 ml  Output 400 ml  Net -350 ml     Assessment/Plan:  54 y.o. female with LLE popliteal and posterior tibial vein DVT. Felt best that due to her being Jehovah's witness and not wanting blood products that it was best that she not be anticoagulated and have a temporary IVC filter placed. IR placed IVC filter yesterday 12/05/19. She remains asymptomatic. She does not have any edema or cellulitis. Lower extremities are well perfused and warm. She will have her IVC filter  removed once she has recovered from her acute medical issues  DVT prophylaxis:  SCDs   Graceann Congress, [facility] [phone] 12/04/2019 7:43 AM

## 2019-12-04 NOTE — Consult Note (Signed)
WOC Nurse wound follow up Wound type:Midline abdominal wound  VAC is alarming and gurgling.  Bedside RN has patched this and applied copious drape.   I have trimmed this and applied sealing pressure to skin creases and seal is obtained.  Canister is nearly full and I have ordered another. Dressing will be changed in the AM per schedule.  Will follow.  Maple Hudson MSN, RN, FNP-BC CWON Wound, Ostomy, Continence Nurse Pager [phone]

## 2019-12-04 NOTE — TOC Progression Note (Signed)
Transition of Care [facility]) - Progression Note    Patient Details  Name: [name] MRN: [id number] Date of Birth: 01-25-1966  [facility] CM/SW Contact  Nadene Rubins Adria Devon, RN Phone Number: 12/04/2019, 3:39 PM  Clinical Narrative:     NCM continuing to follow for discharge needs. Home health arranged and VAC delivered. Expected Discharge Plan: Home w Home Health Services Barriers to Discharge: Continued Medical Work up  Expected Discharge Plan and Services Expected Discharge Plan: Home w Home Health Services   Discharge Planning Services: CM Consult Post Acute Care Choice: Home Health, Durable Medical Equipment Living arrangements for the past 2 months: Apartment                 DME Arranged: Vac DME Agency: [facility] Date DME Agency Contacted: 11/27/19 Time DME Agency Contacted: 1058 Representative spoke with at DME Agency: Traci HH Arranged: PT, RN HH Agency: [facility] Date [facility] Agency Contacted: 11/27/19 Time HH Agency Contacted: 1059 Representative spoke with at [facility] Agency: Kandee Keen   Social Determinants of Health (SDOH) Interventions    Readmission Risk Interventions No flowsheet data found.

## 2019-12-04 NOTE — Progress Notes (Signed)
Patient completed ostomy care independently and was noted to have done so as ordered without complication

## 2019-12-04 NOTE — Progress Notes (Signed)
PHARMACY - TOTAL PARENTERAL NUTRITION CONSULT NOTE  Indication: Intolerance to enteral feeding  Patient Measurements: Height: 5' 6.5" (168.9 cm) Weight: 90 kg (198 lb 6.6 oz) IBW/kg (Calculated) : 60.44 TPN AdjBW (KG): 67.8 Body mass index is 31.55 kg/m.  Assessment:  69 YOF with recurrent diverticulitis who presented with worsening abdominal pain. She was recently admitted on 8/1 through 8/9 with complicated diverticulitis and multiple abscesses and underwent percutaneous drain x2 placement by IR on 8/3. CT abd on 8/23 showed numerous fistulous connections between the sigmoid colon and abscess cavity.  Planning ex-lap, partial colectomy, colostomy and I&D of abdominal wall abscess, so Pharmacy consulted to manage TPN due to poor enteral nutrition in setting of severe malnutrition.  Glucose / Insulin: A1c 5.5% - CBGs well controlled.  Used 13 units mSSI in past 24 hrs. Electrolytes: low Na, others WNL (lytes trending down with 1/2 TPN) Renal: SCr < 1, BUN WNL LFTs / TGs:  LFTs / tbili / TG WNL  Prealbumin / albumin: BL prealbumin <5 up to 25.3 on 8/30, albumin 2.1, TG 200 (8/30) Intake / Output; MIVF: UOP 0.3 ml/kg/hr, drain 5mL ID: Zosyn (8/31 >> ) for IAI, possible pyelonephritis, abscess - afebrile, WBC WNL GI Imaging:  8/30 CT - pSBO vs ileus, pelvic fluid, abscess, cholelithiasis 9/2 CT - no change, suggesting abscess Surgeries / Procedures:  8/24 - open sigmoid colectomy and end colostomy, SBR, I&D of abdominal wall abscess.  9/1 - IVC placement for age indeterminate DVT  Central access: PICC placed 11/26/19 TPN start date: 11/26/19   Nutritional Goals (per RD rec on 8/30): 2100-2300 kCal, 125-135g protein, >2.1L fluid per day  Current Nutrition:  TPN (1/2 TPN since 8/30) Ensure Surgery BID (none charted given) Soft diet 8/29 > NPO 8/31 for possible IR drainage, delayed until 9/1, back on soft diet.  Ate ~10% of meals, starting to crave food  Plan:  Continue half TPN per  Surgery - TPN at 45 ml/hr provides 64g AA, 140g CHO and 31g ILE for a total of 1046 kCal, meeting 50% of patient needs Electrolytes in TPN: incr Na 150mEq/L, incr K 90mEq/L, Ca 38mEq/L, incr Mag 18mEq/L, Phos 12mmol/L, Cl:Ac 1:1 Add standard MVI and trace elements to TPN Continue moderate SSI Q4H Standard TPN labs on Mon and Thurs F/U oral intake to possibly wean off of TPN tomorrow  Stevon Gough D. Laney Potash, PharmD, BCPS, BCCCP 12/04/2019, 8:53 AM

## 2019-12-04 NOTE — Progress Notes (Addendum)
Physical Therapy Treatment Patient Details Name: [name] MRN: [id number] DOB: May 16, 1965 Today's Date: 12/04/2019    History of Present Illness 54yo female PMH HTN and recurrent diverticulitis who returned to the ED today with 2 days of worsening abdominal pain.  Patient was admitted 8/1 through 8/9 with complicated diverticulitis and multiple abscesses. CT scan shows significantly increased size of large abscess within the soft tissues of the lower anterior abdominal wall now measuring up to approximately 21 cm, previously 8 cm. Pt is now s/p Open sigmoid colectomy and end colostomy (Hartmann's procedure), small bowel resection, incision and drainage of abdominal wall abscess with debridement of skin and subcutaneous fatty tissue on 8/24. Found to have LLE popliteal and posterior tibial vein DVT, age indeterminate. S/p IVC filter 12/03/2019.    PT Comments    Pt making excellent progress towards their physical therapy goals. Able to participate in bed level warm up exercises and ambulating 250 feet with a walker at a supervision level. HR 115-154 bpm. Pt asymptomatic for dizziness/lightheadedness or shortness of breath. She was in good spirits following walk. Will continue to progress as tolerated.     Follow Up Recommendations  Home health PT;Supervision for mobility/OOB     Equipment Recommendations  Rolling walker with 5" wheels    Recommendations for Other Services       Precautions / Restrictions Precautions Precautions: Fall;Other (comment) Precaution Comments: colostomy Restrictions Weight Bearing Restrictions: No    Mobility  Bed Mobility Overal bed mobility: Modified Independent                Transfers Overall transfer level: Modified independent Equipment used: Rolling walker (2 wheeled)                Ambulation/Gait Ambulation/Gait assistance: Supervision Gait Distance (Feet): 250 Feet Assistive device: Rolling walker (2 wheeled) Gait  Pattern/deviations: Step-through pattern;Decreased stride length Gait velocity: decreased   General Gait Details: Steady pace, good posture, supervision for safety   Stairs             Wheelchair Mobility    Modified Rankin (Stroke Patients Only)       Balance Overall balance assessment: Needs assistance Sitting-balance support: Feet supported Sitting balance-Leahy Scale: Good     Standing balance support: During functional activity;No upper extremity supported Standing balance-Leahy Scale: Fair                              Cognition Arousal/Alertness: Awake/alert Behavior During Therapy: WFL for tasks assessed/performed Overall Cognitive Status: Within Functional Limits for tasks assessed                                        Exercises General Exercises - Lower Extremity Heel Slides: Both;10 reps;Supine Straight Leg Raises: Both;10 reps;Supine    General Comments        Pertinent Vitals/Pain Pain Assessment: No/denies pain    Home Living                      Prior Function            PT Goals (current goals can now be found in the care plan section) Acute Rehab PT Goals Patient Stated Goal: home, to return to independence Potential to Achieve Goals: Good Progress towards PT goals: Progressing toward goals    Frequency  Min 3X/week      PT Plan Current plan remains appropriate    Co-evaluation              AM-PAC PT "6 Clicks" Mobility   Outcome Measure  Help needed turning from your back to your side while in a flat bed without using bedrails?: None Help needed moving from lying on your back to sitting on the side of a flat bed without using bedrails?: None Help needed moving to and from a bed to a chair (including a wheelchair)?: None Help needed standing up from a chair using your arms (e.g., wheelchair or bedside chair)?: None Help needed to walk in hospital room?: None Help needed  climbing 3-5 steps with a railing? : A Little 6 Click Score: 23    End of Session   Activity Tolerance: Patient tolerated treatment well Patient left: in chair;with call bell/phone within reach Nurse Communication: Mobility status PT Visit Diagnosis: Other abnormalities of gait and mobility (R26.89);Muscle weakness (generalized) (M62.81)     Time: 4765-4650 PT Time Calculation (min) (ACUTE ONLY): 17 min  Charges:  $Therapeutic Activity: 8-22 mins                       Lillia Pauls, PT, DPT Acute Rehabilitation Services Pager [phone] Office [phone]    Norval Morton 12/04/2019, 9:18 AM

## 2019-12-04 NOTE — Progress Notes (Signed)
9 Days Post-Op  Subjective: CC: Doing well. Some pain in her left upper abdomen. No n/v. Starting to get an appetite back. Had mashed potatoes for dinner yesterday. Having colostomy output. Working well with therapies. Seen mobilizing in the halls this AM.   Objective: Vital signs in last 24 hours: Temp:  [97.8 F (36.6 C)-99.1 F (37.3 C)] 99 F (37.2 C) (09/02 0405) Pulse Rate:  [101-113] 105 (09/02 0405) Resp:  [14-20] 18 (09/02 0405) BP: (101-127)/(58-82) 112/74 (09/02 0405) SpO2:  [98 %-100 %] 99 % (09/02 0405) Last BM Date: 12/03/19  Intake/Output from previous day: 09/01 0701 - 09/02 0700 In: 50 [IV Piggyback:50] Out: 650 [Urine:550; Drains:100] Intake/Output this shift: Total I/O In: -  Out: 650 [Urine:650]  PE: Gen: Alert, NAD, pleasant HEENT: EOM's intact, pupils equal and round Card: RRR Pulm: CTAB, no W/R/R, effort normal. Off o2 Abd: Soft,mild distension, there is tenderness of theleftabdomenand epigastriumthat is stable from yesterday.No peritonitis.+BS. Colostomy in place. There is somebrown stool in the bag.Unable to see stoma passed stool.Midline wound with vac in place withthick, bloody/purulentoutput with sedimentin cannister. See picture below from yesterday.   Ext: No LE edemaor tenderness.  Psych: A&Ox3  Skin:No rashes noted, warm and dry     Lab Results:  No results for input(s): WBC, HGB, HCT, PLT in the last 72 hours. BMET Recent Labs    12/03/19 0331 12/04/19 0335  NA 132* 133*  K 3.8 3.7  CL 98 99  CO2 25 26  GLUCOSE 109* 146*  BUN 11 12  CREATININE 0.55 0.61  CALCIUM 8.7* 8.4*   PT/INR No results for input(s): LABPROT, INR in the last 72 hours. CMP     Component Value Date/Time   NA 133 (L) 12/04/2019 0335   K 3.7 12/04/2019 0335   CL 99 12/04/2019 0335   CO2 26 12/04/2019 0335   GLUCOSE 146 (H) 12/04/2019 0335   BUN 12 12/04/2019 0335   CREATININE 0.61 12/04/2019 0335   CALCIUM 8.4 (L) 12/04/2019  0335   PROT 5.8 (L) 12/04/2019 0335   ALBUMIN 2.1 (L) 12/04/2019 0335   AST 20 12/04/2019 0335   ALT 14 12/04/2019 0335   ALKPHOS 86 12/04/2019 0335   BILITOT 1.1 12/04/2019 0335   GFRNONAA >60 12/04/2019 0335   GFRAA >60 12/04/2019 0335   Lipase  No results found for: LIPASE     Studies/Results: IR IVC FILTER PLMT / S&I /IMG GUID/MOD SED  Result Date: 12/03/2019 CLINICAL DATA:  Left lower extremity DVT, healing abdominal wound after prior colectomy and inability to receive full anticoagulation. Request has been made to place an IVC filter. EXAM: 1. ULTRASOUND GUIDANCE FOR VASCULAR ACCESS OF THE RIGHT INTERNAL JUGULAR VEIN. 2. IVC VENOGRAM. 3. PERCUTANEOUS IVC FILTER PLACEMENT. ANESTHESIA/SEDATION: 1.0 mg IV Versed; 50 mcg IV Fentanyl. Total Moderate Sedation Time: 10 minutes. The patient's level of consciousness and physiologic status were continuously monitored during the procedure by Radiology nursing. A time-out was performed prior to initiating the procedure. CONTRAST:  30 mL Omnipaque 300 FLUOROSCOPY TIME:  1 minutes and 12 seconds.  24.0 mGy. PROCEDURE: The procedure, risks, benefits, and alternatives were explained to the patient. Questions regarding the procedure were encouraged and answered. The patient understands and consents to the procedure. A time-out was performed prior to initiating the procedure. The right neck was prepped with chlorhexidine in a sterile fashion, and a sterile drape was applied covering the operative field. A sterile gown and sterile gloves were used for  the procedure. Local anesthesia was provided with 1% Lidocaine. Ultrasound was utilized to confirm patency of the right internal jugular vein. Under direct ultrasound guidance, a 21 gauge needle was advanced into the right internal jugular vein with ultrasound image documentation performed. After securing access with a micropuncture dilator, a guidewire was advanced into the inferior vena cava. A deployment sheath  was advanced over the guidewire. This was utilized to perform IVC venography. The deployment sheath was further positioned in an appropriate location for filter deployment. A Bard Denali IVC filter was then advanced in the sheath. This was then fully deployed in the infrarenal IVC. Final filter position was confirmed with a fluoroscopic spot image. After the procedure the sheath was removed and hemostasis obtained with manual compression. COMPLICATIONS: None. FINDINGS: IVC venography demonstrates a normal caliber IVC with no evidence of thrombus. Renal veins are identified bilaterally. The IVC filter was successfully positioned below the level of the renal veins and is appropriately oriented. This IVC filter has both permanent and retrievable indications. IMPRESSION: Placement of percutaneous IVC filter in infrarenal IVC. IVC venogram shows no evidence of IVC thrombus and normal caliber of the inferior vena cava. This filter does have both permanent and retrievable indications. PLAN: This IVC filter is potentially retrievable. The patient will be assessed for filter retrieval by Interventional Radiology in approximately 8-12 weeks. Further recommendations regarding filter retrieval, continued surveillance or declaration of device permanence, will be made at that time. Electronically Signed   By: Irish Lack M.D.   On: 12/03/2019 14:49   CT PELVIS LIMITED WO CONTRAST  Result Date: 12/04/2019 CLINICAL DATA:  Pelvic fluid collection. EXAM: CT PELVIS WITHOUT CONTRAST TECHNIQUE: Multidetector CT imaging of the pelvis was performed following the standard protocol without intravenous contrast. COMPARISON:  12/01/2019 FINDINGS: Urinary Tract:  Bladder is moderately distended. Bowel: No small bowel or colonic dilatation within the visualized lower abdomen and pelvis. Small-bowel distention seen on the previous study has decreased in the interval. Left-sided end colostomy Vascular/Lymphatic: No abdominal aortic  aneurysm. IVC filter visualized in situ. No lymphadenopathy. Reproductive:  Hysterectomy.  There is no adnexal mass. Other: 6.1 x 3.3 cm fluid collection identified anterior pelvis as on CT of 12/01/2019. There is a tiny gas locule in the anterior aspect of this collection. Features concerning for abscess. Gallstones again noted. Musculoskeletal: Open midline wound identified lower anterior abdominal wall. No worrisome lytic or sclerotic osseous abnormality. IMPRESSION: No substantial interval change since 12/01/2019. Similar appearance of the anterior midline collection of gas and fluid, suggesting abscess. Electronically Signed   By: Kennith Center M.D.   On: 12/04/2019 05:34   VAS [place] LOWER EXTREMITY VENOUS (DVT)  Result Date: 12/02/2019  Lower Venous DVTStudy Indications: Filling defect on CT.  Comparison Study: no prior Performing Technologist: Blanch Media RVS  Examination Guidelines: A complete evaluation includes B-mode imaging, spectral Doppler, color Doppler, and power Doppler as needed of all accessible portions of each vessel. Bilateral testing is considered an integral part of a complete examination. Limited examinations for reoccurring indications may be performed as noted. The reflux portion of the exam is performed with the patient in reverse Trendelenburg.  +---------+---------------+---------+-----------+----------+--------------+ RIGHT    CompressibilityPhasicitySpontaneityPropertiesThrombus Aging +---------+---------------+---------+-----------+----------+--------------+ CFV      Full           Yes      Yes                                 +---------+---------------+---------+-----------+----------+--------------+  SFJ      Full                                                        +---------+---------------+---------+-----------+----------+--------------+ FV Prox  Full                                                         +---------+---------------+---------+-----------+----------+--------------+ FV Mid   Full                                                        +---------+---------------+---------+-----------+----------+--------------+ FV DistalFull                                                        +---------+---------------+---------+-----------+----------+--------------+ PFV      Full                                                        +---------+---------------+---------+-----------+----------+--------------+ POP      Full           Yes      Yes                                 +---------+---------------+---------+-----------+----------+--------------+ PTV      Full                                                        +---------+---------------+---------+-----------+----------+--------------+ PERO     Full                                                        +---------+---------------+---------+-----------+----------+--------------+   +---------+---------------+---------+-----------+----------+-----------------+ LEFT     CompressibilityPhasicitySpontaneityPropertiesThrombus Aging    +---------+---------------+---------+-----------+----------+-----------------+ CFV      Full           Yes      Yes                                    +---------+---------------+---------+-----------+----------+-----------------+ SFJ      Full                                                           +---------+---------------+---------+-----------+----------+-----------------+  FV Prox  Full                                                           +---------+---------------+---------+-----------+----------+-----------------+ FV Mid   Full                                                           +---------+---------------+---------+-----------+----------+-----------------+ FV DistalFull                                                            +---------+---------------+---------+-----------+----------+-----------------+ PFV      Full                                                           +---------+---------------+---------+-----------+----------+-----------------+ POP      None           Yes      Yes                  Age Indeterminate +---------+---------------+---------+-----------+----------+-----------------+ PTV      Partial                                      Age Indeterminate +---------+---------------+---------+-----------+----------+-----------------+ PERO     Full                                                           +---------+---------------+---------+-----------+----------+-----------------+     Summary: RIGHT: - There is no evidence of deep vein thrombosis in the lower extremity.  - No cystic structure found in the popliteal fossa.  LEFT: - Findings consistent with age indeterminate deep vein thrombosis involving the left popliteal vein, and left posterior tibial veins. - No cystic structure found in the popliteal fossa.  *See table(s) above for measurements and observations. Electronically signed by Coral Else MD on 12/02/2019 at 8:51:41 PM.    Final     Anti-infectives: Anti-infectives (From admission, onward)   Start     Dose/Rate Route Frequency Ordered Stop   12/02/19 0900  piperacillin-tazobactam (ZOSYN) IVPB 3.375 g        3.375 g 12.5 mL/hr over 240 Minutes Intravenous Every 8 hours 12/02/19 0824     11/25/19 1700  fluconazole (DIFLUCAN) IVPB 400 mg        400 mg 100 mL/hr over 120 Minutes Intravenous Every 24 hours 11/25/19 1649 11/29/19 2356   11/24/19 2000  vancomycin (VANCOCIN) IVPB 1000 mg/200 mL premix  Status:  Discontinued  1,000 mg 200 mL/hr over 60 Minutes Intravenous Every 8 hours 11/24/19 1111 11/25/19 1649   11/24/19 1400  piperacillin-tazobactam (ZOSYN) IVPB 3.375 g        3.375 g 12.5 mL/hr over 240 Minutes Intravenous Every 8 hours 11/24/19 1355 11/29/19  2030   11/24/19 1015  ceFEPIme (MAXIPIME) 2 g in sodium chloride 0.9 % 100 mL IVPB        2 g 200 mL/hr over 30 Minutes Intravenous  Once 11/24/19 1003 11/24/19 1130   11/24/19 1015  metroNIDAZOLE (FLAGYL) IVPB 500 mg        500 mg 100 mL/hr over 60 Minutes Intravenous  Once 11/24/19 1003 11/24/19 1400   11/24/19 0945  vancomycin (VANCOCIN) IVPB 1000 mg/200 mL premix  Status:  Discontinued        1,000 mg 200 mL/hr over 60 Minutes Intravenous  Once 11/24/19 0931 11/24/19 0939   11/24/19 0945  vancomycin (VANCOREADY) IVPB 1500 mg/300 mL        1,500 mg 150 mL/hr over 120 Minutes Intravenous  Once 11/24/19 0939 11/24/19 1418       Assessment/Plan HTN - home med ABLAnemia - Hgb7.5> 5.7 > 6.5> 5.7> 6.4 (8/30).Patient states that due to religious beliefs she would still not want any blood products and understands the risks.Pediatric tube blood draws only to limit loss. We will hold off on daily blood draws to limit loss.Cont IV Ferumoxytol, and Aranesp Code status - DNR/DNI. Note from 8/26 "We discussed that given patients dropping hgb, she is at increased risk for decompensation that could lead to a cardiac event or death. We discussed code status in detail. Patient wishes to be DNR/DNI. We filled out a MOST form that was signed by the patient and placed in her paper chart as well as scanned into the media section. Her code status was changed on electronic records as well." Malnutrition - prealbumin <5to 25.3, on 1/2 TPN. Wean off when tolerating soft diet  L popliteal vein and left posterior tibial veins - Seen by Dr. Myra Gianotti of vascular. S/p IVC filter placement by IR on 9/1 Possible Pyelonephritis on CT- UA and Ucx negative   Perforated diverticulitis with w/feculent peritonitisand abdominal wall abscess - s/p open sigmoid colectomy and end colostomy (Hartmann's procedure), small bowel resection, incision and drainage of abdominal wall abscess with debridement of skin and  subcutaneous fatty tissueby Dr. Donell Beers 8/24 - POD #9 -CT 8/30 withPelvic fluid measuring 6.5 x 3.2 x 2.8 cm. On IV abx. IR unable to place drain 9/1. Appears to be decompressing through wound vac -Cont1/2TPN today. If tolerates full day of soft diet (today), will plan to wean off tomorrow.  - WOCN following for new ostomyand wound vac.Change m/w/f. Output from vac purulent - Mobilize. PT/OT.Recommending HH - Pulm toilet, IS - Path without evidence of malignancy. As noted below  ID -[email or web address].vancomycin8/23.Zosyn 8/23- 8/28.Diflucan 8/24-8/28. Zosyn 8/31 >> VTE -SCDs,hold lovenox2/2 anemia FEN -Soft+ensure. Cont1/2TPN (will consider weaning TPN off once tolerating soft diet) Foley -Dc'd 8/26 Follow up -Dr. Donell Beers  Path A. COLON, SIGMOID, RESECTION:  - Segment of colon (25 cm) showing diverticulosis, diverticulitis with  associated abscesses, serositis and serosal adhesions  - Serosal adhesions to portion of benign ovary  - Endometriosis  - Margins appear viable   B. SMALL BOWEL, SEGMENT OF ILEUM, RESECTION:  - Segment of small intestine (11.5 cm) with serositis and serosal  adhesions  - Margins appear viable    LOS: 10 days    Elmer Sow  [place] , [facility] 12/04/2019, 8:46 AM Please see Amion for pager number during day hours 7:00am-4:30pm

## 2019-12-05 LAB — CBC
HCT: 19.2 % — ABNORMAL LOW (ref 36.0–46.0)
Hemoglobin: 6.4 g/dL — CL (ref 12.0–15.0)
MCH: 25.8 pg — ABNORMAL LOW (ref 26.0–34.0)
MCHC: 33.3 g/dL (ref 30.0–36.0)
MCV: 77.4 fL — ABNORMAL LOW (ref 80.0–100.0)
Platelets: 458 10*3/uL — ABNORMAL HIGH (ref 150–400)
RBC: 2.48 MIL/uL — ABNORMAL LOW (ref 3.87–5.11)
RDW: 25.8 % — ABNORMAL HIGH (ref 11.5–15.5)
WBC: 11.7 10*3/uL — ABNORMAL HIGH (ref 4.0–10.5)
nRBC: 0.3 % — ABNORMAL HIGH (ref 0.0–0.2)

## 2019-12-05 LAB — GLUCOSE, CAPILLARY
Glucose-Capillary: 115 mg/dL — ABNORMAL HIGH (ref 70–99)
Glucose-Capillary: 139 mg/dL — ABNORMAL HIGH (ref 70–99)
Glucose-Capillary: 135 mg/dL — ABNORMAL HIGH (ref 70–99)
Glucose-Capillary: 102 mg/dL — ABNORMAL HIGH (ref 70–99)
Glucose-Capillary: 138 mg/dL — ABNORMAL HIGH (ref 70–99)

## 2019-12-05 MED ORDER — TRAVASOL 10 % IV SOLN
INTRAVENOUS | Status: AC
  Filled 2019-12-05: qty 636

## 2019-12-05 MED ORDER — ENSURE SURGERY PO LIQD
237.0000 mL | Freq: Three times a day (TID) | ORAL | Status: DC
  Administered 2019-12-06 – 2019-12-12 (×11): 237 mL via ORAL
  Filled 2019-12-05 (×23): qty 237

## 2019-12-05 NOTE — Progress Notes (Signed)
[facility] Progress Note  10 Days Post-Op  Subjective: CC-  In good spirits. Some mild upper abdominal pain, well controlled. Denies n/v. Colostomy functioning. Appetite slowly improving. She is drinking Ensure x2 daily and eating about 25% of her meals.  WBC down to 11.7, afebrile. Hg stable at 6.4  Objective: Vital signs in last 24 hours: Temp:  [98.1 F (36.7 C)-98.2 F (36.8 C)] 98.2 F (36.8 C) (09/03 0421) Pulse Rate:  [100-101] 101 (09/03 0421) Resp:  [18-19] 18 (09/03 0421) BP: (92-110)/(54-61) 102/61 (09/03 0421) SpO2:  [99 %-100 %] 99 % (09/03 0421) Last BM Date: 12/05/19  Intake/Output from previous day: 09/02 0701 - 09/03 0700 In: 2381.1 [P.O.:600; I.V.:1672.8; IV Piggyback:108.3] Out: 1935 [Urine:1775; Drains:10; Stool:150] Intake/Output this shift: Total I/O In: 120 [P.O.:120] Out: -   PE: Gen: Alert, NAD, pleasant HEENT: EOM's intact, pupils equal and round Card: RRR Pulm: CTAB, no W/R/R, effort normal on room air Abd: Soft,mild distension, nontender, +BS. Colostomy viable with new pouch in place. Midline wound pictured below/ some undermining at 12, 3, and 9 o'clock/ thick and purulent drainage in cannister     Lab Results:  Recent Labs    12/05/19 0354  WBC 11.7*  HGB 6.4*  HCT 19.2*  PLT 458*   BMET Recent Labs    12/03/19 0331 12/04/19 0335  NA 132* 133*  K 3.8 3.7  CL 98 99  CO2 25 26  GLUCOSE 109* 146*  BUN 11 12  CREATININE 0.55 0.61  CALCIUM 8.7* 8.4*   PT/INR No results for input(s): LABPROT, INR in the last 72 hours. CMP     Component Value Date/Time   NA 133 (L) 12/04/2019 0335   K 3.7 12/04/2019 0335   CL 99 12/04/2019 0335   CO2 26 12/04/2019 0335   GLUCOSE 146 (H) 12/04/2019 0335   BUN 12 12/04/2019 0335   CREATININE 0.61 12/04/2019 0335   CALCIUM 8.4 (L) 12/04/2019 0335   PROT 5.8 (L) 12/04/2019 0335   ALBUMIN 2.1 (L) 12/04/2019 0335   AST 20 12/04/2019 0335   ALT 14 12/04/2019 0335    ALKPHOS 86 12/04/2019 0335   BILITOT 1.1 12/04/2019 0335   GFRNONAA >60 12/04/2019 0335   GFRAA >60 12/04/2019 0335   Lipase  No results found for: LIPASE     Studies/Results: IR IVC FILTER PLMT / S&I /IMG GUID/MOD SED  Result Date: 12/03/2019 CLINICAL DATA:  Left lower extremity DVT, healing abdominal wound after prior colectomy and inability to receive full anticoagulation. Request has been made to place an IVC filter. EXAM: 1. ULTRASOUND GUIDANCE FOR VASCULAR ACCESS OF THE RIGHT INTERNAL JUGULAR VEIN. 2. IVC VENOGRAM. 3. PERCUTANEOUS IVC FILTER PLACEMENT. ANESTHESIA/SEDATION: 1.0 mg IV Versed; 50 mcg IV Fentanyl. Total Moderate Sedation Time: 10 minutes. The patient's level of consciousness and physiologic status were continuously monitored during the procedure by Radiology nursing. A time-out was performed prior to initiating the procedure. CONTRAST:  30 mL Omnipaque 300 FLUOROSCOPY TIME:  1 minutes and 12 seconds.  24.0 mGy. PROCEDURE: The procedure, risks, benefits, and alternatives were explained to the patient. Questions regarding the procedure were encouraged and answered. The patient understands and consents to the procedure. A time-out was performed prior to initiating the procedure. The right neck was prepped with chlorhexidine in a sterile fashion, and a sterile drape was applied covering the operative field. A sterile gown and sterile gloves were used for the procedure. Local anesthesia was provided with 1% Lidocaine. Ultrasound was utilized  to confirm patency of the right internal jugular vein. Under direct ultrasound guidance, a 21 gauge needle was advanced into the right internal jugular vein with ultrasound image documentation performed. After securing access with a micropuncture dilator, a guidewire was advanced into the inferior vena cava. A deployment sheath was advanced over the guidewire. This was utilized to perform IVC venography. The deployment sheath was further positioned in  an appropriate location for filter deployment. A Bard Denali IVC filter was then advanced in the sheath. This was then fully deployed in the infrarenal IVC. Final filter position was confirmed with a fluoroscopic spot image. After the procedure the sheath was removed and hemostasis obtained with manual compression. COMPLICATIONS: None. FINDINGS: IVC venography demonstrates a normal caliber IVC with no evidence of thrombus. Renal veins are identified bilaterally. The IVC filter was successfully positioned below the level of the renal veins and is appropriately oriented. This IVC filter has both permanent and retrievable indications. IMPRESSION: Placement of percutaneous IVC filter in infrarenal IVC. IVC venogram shows no evidence of IVC thrombus and normal caliber of the inferior vena cava. This filter does have both permanent and retrievable indications. PLAN: This IVC filter is potentially retrievable. The patient will be assessed for filter retrieval by Interventional Radiology in approximately 8-12 weeks. Further recommendations regarding filter retrieval, continued surveillance or declaration of device permanence, will be made at that time. Electronically Signed   By: Irish Lack M.D.   On: 12/03/2019 14:49   CT PELVIS LIMITED WO CONTRAST  Result Date: 12/04/2019 CLINICAL DATA:  Pelvic fluid collection. EXAM: CT PELVIS WITHOUT CONTRAST TECHNIQUE: Multidetector CT imaging of the pelvis was performed following the standard protocol without intravenous contrast. COMPARISON:  12/01/2019 FINDINGS: Urinary Tract:  Bladder is moderately distended. Bowel: No small bowel or colonic dilatation within the visualized lower abdomen and pelvis. Small-bowel distention seen on the previous study has decreased in the interval. Left-sided end colostomy Vascular/Lymphatic: No abdominal aortic aneurysm. IVC filter visualized in situ. No lymphadenopathy. Reproductive:  Hysterectomy.  There is no adnexal mass. Other: 6.1 x 3.3  cm fluid collection identified anterior pelvis as on CT of 12/01/2019. There is a tiny gas locule in the anterior aspect of this collection. Features concerning for abscess. Gallstones again noted. Musculoskeletal: Open midline wound identified lower anterior abdominal wall. No worrisome lytic or sclerotic osseous abnormality. IMPRESSION: No substantial interval change since 12/01/2019. Similar appearance of the anterior midline collection of gas and fluid, suggesting abscess. Electronically Signed   By: Kennith Center M.D.   On: 12/04/2019 05:34    Anti-infectives: Anti-infectives (From admission, onward)   Start     Dose/Rate Route Frequency Ordered Stop   12/02/19 0900  piperacillin-tazobactam (ZOSYN) IVPB 3.375 g        3.375 g 12.5 mL/hr over 240 Minutes Intravenous Every 8 hours 12/02/19 0824     11/25/19 1700  fluconazole (DIFLUCAN) IVPB 400 mg        400 mg 100 mL/hr over 120 Minutes Intravenous Every 24 hours 11/25/19 1649 11/29/19 2356   11/24/19 2000  vancomycin (VANCOCIN) IVPB 1000 mg/200 mL premix  Status:  Discontinued        1,000 mg 200 mL/hr over 60 Minutes Intravenous Every 8 hours 11/24/19 1111 11/25/19 1649   11/24/19 1400  piperacillin-tazobactam (ZOSYN) IVPB 3.375 g        3.375 g 12.5 mL/hr over 240 Minutes Intravenous Every 8 hours 11/24/19 1355 11/29/19 2030   11/24/19 1015  ceFEPIme (MAXIPIME) 2 g  in sodium chloride 0.9 % 100 mL IVPB        2 g 200 mL/hr over 30 Minutes Intravenous  Once 11/24/19 1003 11/24/19 1130   11/24/19 1015  metroNIDAZOLE (FLAGYL) IVPB 500 mg        500 mg 100 mL/hr over 60 Minutes Intravenous  Once 11/24/19 1003 11/24/19 1400   11/24/19 0945  vancomycin (VANCOCIN) IVPB 1000 mg/200 mL premix  Status:  Discontinued        1,000 mg 200 mL/hr over 60 Minutes Intravenous  Once 11/24/19 0931 11/24/19 0939   11/24/19 0945  vancomycin (VANCOREADY) IVPB 1500 mg/300 mL        1,500 mg 150 mL/hr over 120 Minutes Intravenous  Once 11/24/19 0939  11/24/19 1418       Assessment/Plan HTN - home med ABLAnemia - Hgb7.5> 5.7 > 6.5> 5.7> 6.4 > 6.4(9/3).Patient states that due to religious beliefs she would still not want any blood products and understands the risks.Pediatric tube blood draws only to limit loss. We will hold off on daily blood draws to limit loss.Cont IV Ferumoxytol, and Aranesp Code status - DNR/DNI. Note from 8/26 "We discussed that given patients dropping hgb, she is at increased risk for decompensation that could lead to a cardiac event or death. We discussed code status in detail. Patient wishes to be DNR/DNI. We filled out a MOST form that was signed by the patient and placed in her paper chart as well as scanned into the media section. Her code status was changed on electronic records as well." Malnutrition - prealbumin <5to 25.3,on 1/2TPN. Wean off when tolerating soft diet  L popliteal vein and left posterior tibial veins-Seen by Dr. Myra Gianotti of vascular. S/p IVC filter placement by IR on 9/1 Possible Pyelonephritis on CT- UA and Ucx negative   Perforated diverticulitis with w/feculent peritonitisand abdominal wall abscess - s/p open sigmoid colectomy and end colostomy (Hartmann's procedure), small bowel resection, incision and drainage of abdominal wall abscess with debridement of skin and subcutaneous fatty tissueby Dr. Donell Beers 8/24 - POD #10 -CT8/30withPelvic fluid measuring 6.5 x 3.2 x 2.8 cm.On IV abx. IR unable to place drain 9/1. Appears to be decompressing through wound vac, leukocytosis improving. Likely plan for repeat CT over the weekend to reevaluate. -Cont1/2TPN today. Increase Ensure to TID and encourage PO intake (currently tolerating about 25% of her meals). Plan to d/c TPN when tolerating more PO - WOCN following for new ostomyand wound vac.Change m/w/f. Output from vac purulent - Mobilize. PT/OT.Recommending HH - Pulm toilet, IS - Path without evidence of malignancy. As  noted below  ID -[email or web address].Zosyn 8/23- 8/28.Diflucan 8/24-8/28. Zosyn 8/31 >> VTE -SCDs,hold lovenox2/2 anemia FEN -Soft+ensure. Cont1/2TPN(will consider weaning TPN off once tolerating soft diet) Foley -Dc'd 8/26 Follow up -Dr. Donell Beers  Path A. COLON, SIGMOID, RESECTION:  - Segment of colon (25 cm) showing diverticulosis, diverticulitis with  associated abscesses, serositis and serosal adhesions  - Serosal adhesions to portion of benign ovary  - Endometriosis  - Margins appear viable   B. SMALL BOWEL, SEGMENT OF ILEUM, RESECTION:  - Segment of small intestine (11.5 cm) with serositis and serosal  adhesions  - Margins appear viable    LOS: 11 days    [name], [facility] 12/05/2019, 11:44 AM Please see Amion for pager number during day hours 7:00am-4:30pm

## 2019-12-05 NOTE — Consult Note (Signed)
WOC Nurse wound follow up. Patient seen in Room [phone]. No family present.  Wound type: Midline surgical abdominal Wound bed: Pink granulation tissue Drainage (amount, consistency, odor) appeared as tan, thick in canister. No drainage from wound during dressing change.  Periwound: Intact Dressing procedure/placement/frequency: All pieces of black foam removed. 3 pieces of black foam placed into wound bed. A 3.8 cm tunnel detected at 12:00. This tunnel was filled with a piece of the black foam. Drape applied, immediate seal obtained with of suction.   WOC Nurse ostomy follow up Stoma type/location: LUQ colostomy Stomal assessment/size: Red, slightly edemetous, budded. Slightly oval shaped 1 1/2" side to side, 1" top to bottom.  Peristomal assessment: Intact Treatment options for stomal/peristomal skin: Barrier ring. Output: soft brown Ostomy pouching: 1pc pouch Hart [place] [phone]). Barrier ring Hart [place] # [phone]) Education provided: Patient participated in removing old pouching system. Patient able to open and close pouches. Patient taught to clean around stoma with water only. She was shown how to use measuring template to measure stoma and how to use existing pattern to cut opening in new pouch. Attention was paid to pouching the ostomy completely independently of the Wound Vac dressing. These are now two separate processes. Patient expressed appreciation for making these two separate processes.  Supplies for [facility] and Ostomy care in room.   Renaldo Reel Katrinka Blazing, MSN, RN, CMSRN, AGCNS, [facility] Wound Treatment Associate Wound, Ostomy, Continence Nurse Office [phone]

## 2019-12-05 NOTE — Progress Notes (Signed)
MEDICATION RELATED CONSULT NOTE  Pharmacy Consult for Aranesp Indication: anemia  Allergies  Allergen Reactions  . Aspirin     diarheea , stomach ache.     Patient Measurements: Height: 5' 6.5" (168.9 cm) Weight: 90 kg (198 lb 6.6 oz) IBW/kg (Calculated) : 60.44  Labs: Recent Labs    12/03/19 0331 12/04/19 0335 12/05/19 0354  WBC  --   --  11.7*  HGB  --   --  6.4*  HCT  --   --  19.2*  PLT  --   --  458*  CREATININE 0.55 0.61  --   MG 1.9 1.8  --   PHOS  --  3.8  --   ALBUMIN  --  2.1*  --   PROT  --  5.8*  --   AST  --  20  --   ALT  --  14  --   ALKPHOS  --  86  --   BILITOT  --  1.1  --    Estimated Creatinine Clearance: 90.6 mL/min (by C-G formula based on SCr of 0.61 mg/dL).  Assessment: 54 YOF to continue on Aranesp for anemia.  No blood transfusions for religious reasons.  Iron studies deferred since patient received Feraheme 510mg  IV x 2 doses.  Hemoglobin stable at 6.4; no bleeding reported.  Goal of Therapy:  Improve or stable hemoglobin Bleeding prevention  Plan:  Continue Aranesp SQ qFri for up to 4 dose (today is dose #2) Slow onset, may take a few weeks to see effect on Hgb Monitor effect and could consider increasing Aranesp dose  [name] D. 05-10-1982, PharmD, BCPS, BCCCP 12/05/2019, 10:05 AM

## 2019-12-05 NOTE — Progress Notes (Signed)
Occupational Therapy Treatment Patient Details Name: [name] MRN: [id number] DOB: 1965-06-02 Today's Date: 12/05/2019    History of present illness 54yo female PMH HTN and recurrent diverticulitis who returned to the ED today with 2 days of worsening abdominal pain.  Patient was admitted 8/1 through 8/9 with complicated diverticulitis and multiple abscesses. CT scan shows significantly increased size of large abscess within the soft tissues of the lower anterior abdominal wall now measuring up to approximately 21 cm, previously 8 cm. Pt is now s/p Open sigmoid colectomy and end colostomy (Hartmann's procedure), small bowel resection, incision and drainage of abdominal wall abscess with debridement of skin and subcutaneous fatty tissue on 8/24. Found to have LLE popliteal and posterior tibial vein DVT, age indeterminate. S/p IVC filter 12/03/2019.   OT comments  Patient in recliner requesting back to bed.  Agreed to functional mobility in room and on unit.  Patient returned to bed post treatment.  Patient is performing quite well given current medical condition.  She is improving with basic seated LB ADL, and requires assistance only to manage IV pole/lines for transfers and functional mobility.  Patient surgical wound, lines and abdominal discomfort are the barriers currently.  She does not believe she will need HH OT post acute, but is open to anything if circumstances change.  Continue to follow in acute setting per established POC.    Follow Up Recommendations  No OT follow up;Other (comment) (patient declines HH OT at this time.)    Equipment Recommendations  3 in 1 bedside commode    Recommendations for Other Services      Precautions / Restrictions Precautions Precautions: None Precaution Comments: colostomy Restrictions Weight Bearing Restrictions: No       Mobility Bed Mobility   Bed Mobility: Sit to Supine Rolling: Modified independent (Device/Increase time)             Transfers Overall transfer level: Modified independent Equipment used: Rolling walker (2 wheeled) Transfers: Sit to/from Stand Sit to Stand: Supervision              Balance   Sitting-balance support: Feet supported Sitting balance-Leahy Scale: Good     Standing balance support: Bilateral upper extremity supported Standing balance-Leahy Scale: Good                             ADL either performed or assessed with clinical judgement   ADL   Eating/Feeding: Independent;Sitting   Grooming: Wash/dry hands;Wash/dry face;Set up;Sitting               Lower Body Dressing: Supervision/safety;Set up;Sitting/lateral leans Lower Body Dressing Details (indicate cue type and reason): doffed socks and placed slides for functional mobility.  Seatede EOB.  Slowed pace. Toilet Transfer: Firefighter Details (indicate cue type and reason): management of lines         Functional mobility during ADLs: Supervision/safety;Rolling walker                       General Comments      Pertinent Vitals/ Pain       Faces Pain Scale: Hurts a little bit Pain Location: bottom is sore.  ready to lie down. Pain Descriptors / Indicators: Dull Pain Intervention(s): Repositioned  Frequency  Min 2X/week        Progress Toward Goals  OT Goals(current goals can now be found in the care plan section)     Acute Rehab OT Goals Patient Stated Goal: home, to return to independence Potential to Achieve Goals: Good   AM-PAC OT "6 Clicks" Daily Activity     Outcome Measure   Help from another person eating meals?: None Help from another person taking care of personal grooming?: None Help from another person toileting, which includes using toliet, bedpan, or urinal?: A Little Help from another person bathing (including washing, rinsing, drying)?: A  Little Help from another person to put on and taking off regular upper body clothing?: A Little Help from another person to put on and taking off regular lower body clothing?: A Little 6 Click Score: 20    End of Session Equipment Utilized During Treatment: Gait belt;Rolling walker  OT Visit Diagnosis: Pain;Unsteadiness on feet (R26.81)   Activity Tolerance Patient tolerated treatment well;No increased pain   Patient Left in bed;with call bell/phone within reach   Nurse Communication          Time: 1696-7893 OT Time Calculation (min): 17 min  Charges: OT General Charges $OT Visit: 1 Visit OT Treatments $Therapeutic Activity: 8-22 mins  12/05/2019  Rich, OTR/L  Acute Rehabilitation Services  Office:  [phone]    [name] 12/05/2019, 2:21 PM

## 2019-12-05 NOTE — Progress Notes (Addendum)
PHARMACY - TOTAL PARENTERAL NUTRITION CONSULT NOTE  Indication: Intolerance to enteral feeding  Patient Measurements: Height: 5' 6.5" (168.9 cm) Weight: 90 kg (198 lb 6.6 oz) IBW/kg (Calculated) : 60.44 TPN AdjBW (KG): 67.8 Body mass index is 31.55 kg/m.  Assessment:  54 YOF with recurrent diverticulitis who presented with worsening abdominal pain. She was recently admitted on 8/1 through 8/9 with complicated diverticulitis and multiple abscesses and underwent percutaneous drain x2 placement by IR on 8/3. CT abd on 8/23 showed numerous fistulous connections between the sigmoid colon and abscess cavity.  Planning ex-lap, partial colectomy, colostomy and I&D of abdominal wall abscess, so Pharmacy consulted to manage TPN due to poor enteral nutrition in setting of severe malnutrition.  Glucose / Insulin: A1c 5.5% - CBGs well controlled.  Used 11 units mSSI in past 24 hrs. Electrolytes: 9/2 labs 0 low Na, others WNL (lytes trending down with 1/2 TPN) Renal: SCr < 1, BUN WNL LFTs / TGs:  LFTs / tbili / TG WNL  Prealbumin / albumin: BL prealbumin <5 up to 25.3 on 8/30, albumin 2.1, TG 200 (8/30) Intake / Output; MIVF: UOP 0.8 ml/kg/hr, drain 33mL; LBM 9/3 ID: Zosyn (8/31 >> ) for IAI, possible pyelonephritis, abscess - afebrile, WBC WNL GI Imaging:  8/30 CT - pSBO vs ileus, pelvic fluid, abscess, cholelithiasis 9/2 CT - no change, suggesting abscess Surgeries / Procedures:  8/24 - open sigmoid colectomy and end colostomy, SBR, I&D of abdominal wall abscess.  9/1 - IVC placement for age indeterminate DVT  Central access: PICC placed 11/26/19 TPN start date: 11/26/19   Nutritional Goals (per RD rec on 8/30): 2100-2300 kCal, 125-135g protein, >2.1L fluid per day  Current Nutrition:  TPN (1/2 TPN since 8/30) Ensure Surgery BID (1 charted given) Soft diet - up to 20% of meals  Plan:  Continue half TPN per Surgery - TPN at 50 ml/hr provides 64g AA, 140g CHO and 31g ILE for a total of 1046  kCal, meeting 50% of patient needs Electrolytes in TPN: Na 137mEq/L, K 40mEq/L, Ca 87mEq/L, Mag 48mEq/L, Phos 44mmol/L, Cl:Ac 1:1 (Na, K and Mag increased 9/2) - no change today Add standard MVI and trace elements to TPN Continue moderate SSI Q4H F/U AM labs oral intake to possibly wean off of TPN   Danella Penton, Pharmacy Student 12/05/2019, 9:45 AM  Chelsea Aus D. Laney Potash, PharmD, BCPS, BCCCP 12/05/2019, 11:04 AM

## 2019-12-06 LAB — GLUCOSE, CAPILLARY
Glucose-Capillary: 130 mg/dL — ABNORMAL HIGH (ref 70–99)
Glucose-Capillary: 135 mg/dL — ABNORMAL HIGH (ref 70–99)
Glucose-Capillary: 150 mg/dL — ABNORMAL HIGH (ref 70–99)
Glucose-Capillary: 136 mg/dL — ABNORMAL HIGH (ref 70–99)
Glucose-Capillary: 109 mg/dL — ABNORMAL HIGH (ref 70–99)
Glucose-Capillary: 142 mg/dL — ABNORMAL HIGH (ref 70–99)

## 2019-12-06 LAB — BASIC METABOLIC PANEL
Anion gap: 7 (ref 5–15)
BUN: 10 mg/dL (ref 6–20)
CO2: 28 mmol/L (ref 22–32)
Calcium: 8.4 mg/dL — ABNORMAL LOW (ref 8.9–10.3)
Chloride: 99 mmol/L (ref 98–111)
Creatinine, Ser: 0.61 mg/dL (ref 0.44–1.00)
GFR calc Af Amer: >60 mL/min (ref >60)
GFR calc non Af Amer: >60 mL/min (ref >60)
Glucose, Bld: 125 mg/dL — ABNORMAL HIGH (ref 70–99)
Potassium: 4.1 mmol/L (ref 3.5–5.1)
Sodium: 134 mmol/L — ABNORMAL LOW (ref 135–145)

## 2019-12-06 LAB — MAGNESIUM: Magnesium: 1.9 mg/dL (ref 1.7–2.4)

## 2019-12-06 MED ORDER — OXYCODONE HCL 5 MG PO TABS
5.0000 mg | ORAL_TABLET | Freq: Four times a day (QID) | ORAL | Status: DC | PRN
  Administered 2019-12-06 – 2019-12-09 (×7): 5 mg via ORAL
  Filled 2019-12-06 (×10): qty 1

## 2019-12-06 MED ORDER — TRAVASOL 10 % IV SOLN
INTRAVENOUS | Status: AC
  Filled 2019-12-06: qty 636

## 2019-12-06 MED ORDER — HYDROMORPHONE HCL 1 MG/ML IJ SOLN
0.5000 mg | INTRAMUSCULAR | Status: DC | PRN
  Administered 2019-12-06 – 2019-12-09 (×14): 0.5 mg via INTRAVENOUS
  Filled 2019-12-06 (×15): qty 1

## 2019-12-06 NOTE — Progress Notes (Addendum)
[facility] Progress Note  11 Days Post-Op  Subjective: CC-   In good spirits.  Eating much of her food. Limiting blood draws because of her being Jehovah's witness.  Objective: Vital signs in last 24 hours: Temp:  [97.7 F (36.5 C)-98.7 F (37.1 C)] 98.3 F (36.8 C) (09/04 0443) Pulse Rate:  [90-107] 90 (09/04 0443) Resp:  [16-18] 16 (09/04 0443) BP: (103-106)/(61-67) 105/64 (09/04 0443) SpO2:  [100 %] 100 % (09/04 0443) Last BM Date: 12/05/19  Intake/Output from previous day: 09/03 0701 - 09/04 0700 In: 1309.1 [P.O.:595; I.V.:566.4; IV Piggyback:147.7] Out: 160 [Drains:10; Stool:150] Intake/Output this shift: No intake/output data recorded.  PE: Gen: Alert, NAD, pleasant HEENT: EOM's intact, pupils equal and round Card: RRR Pulm: CTAB, no W/R/R,   IS = 1,000 cc Abd: Soft,mild distension, nontender, +BS.   Colostomy viable with new pouch in place. Midline wound with VAC   Wound - 9/3 - with VAC off  Lab Results:  Recent Labs    12/05/19 0354  WBC 11.7*  HGB 6.4*  HCT 19.2*  PLT 458*   BMET Recent Labs    12/04/19 0335 12/06/19 0347  NA 133* 134*  K 3.7 4.1  CL 99 99  CO2 26 28  GLUCOSE 146* 125*  BUN 12 10  CREATININE 0.61 0.61  CALCIUM 8.4* 8.4*   PT/INR No results for input(s): LABPROT, INR in the last 72 hours. CMP     Component Value Date/Time   NA 134 (L) 12/06/2019 0347   K 4.1 12/06/2019 0347   CL 99 12/06/2019 0347   CO2 28 12/06/2019 0347   GLUCOSE 125 (H) 12/06/2019 0347   BUN 10 12/06/2019 0347   CREATININE 0.61 12/06/2019 0347   CALCIUM 8.4 (L) 12/06/2019 0347   PROT 5.8 (L) 12/04/2019 0335   ALBUMIN 2.1 (L) 12/04/2019 0335   AST 20 12/04/2019 0335   ALT 14 12/04/2019 0335   ALKPHOS 86 12/04/2019 0335   BILITOT 1.1 12/04/2019 0335   GFRNONAA >60 12/06/2019 0347   GFRAA >60 12/06/2019 0347   Lipase  No results found for: LIPASE     Studies/Results: No results  found.  Anti-infectives: Anti-infectives (From admission, onward)   Start     Dose/Rate Route Frequency Ordered Stop   12/02/19 0900  piperacillin-tazobactam (ZOSYN) IVPB 3.375 g        3.375 g 12.5 mL/hr over 240 Minutes Intravenous Every 8 hours 12/02/19 0824     11/25/19 1700  fluconazole (DIFLUCAN) IVPB 400 mg        400 mg 100 mL/hr over 120 Minutes Intravenous Every 24 hours 11/25/19 1649 11/29/19 2356   11/24/19 2000  vancomycin (VANCOCIN) IVPB 1000 mg/200 mL premix  Status:  Discontinued        1,000 mg 200 mL/hr over 60 Minutes Intravenous Every 8 hours 11/24/19 1111 11/25/19 1649   11/24/19 1400  piperacillin-tazobactam (ZOSYN) IVPB 3.375 g        3.375 g 12.5 mL/hr over 240 Minutes Intravenous Every 8 hours 11/24/19 1355 11/29/19 2030   11/24/19 1015  ceFEPIme (MAXIPIME) 2 g in sodium chloride 0.9 % 100 mL IVPB        2 g 200 mL/hr over 30 Minutes Intravenous  Once 11/24/19 1003 11/24/19 1130   11/24/19 1015  metroNIDAZOLE (FLAGYL) IVPB 500 mg        500 mg 100 mL/hr over 60 Minutes Intravenous  Once 11/24/19 1003 11/24/19 1400   11/24/19 0945  vancomycin (VANCOCIN)  IVPB 1000 mg/200 mL premix  Status:  Discontinued        1,000 mg 200 mL/hr over 60 Minutes Intravenous  Once 11/24/19 0931 11/24/19 0939   11/24/19 0945  vancomycin (VANCOREADY) IVPB 1500 mg/300 mL        1,500 mg 150 mL/hr over 120 Minutes Intravenous  Once 11/24/19 0939 11/24/19 1418       Assessment/Plan HTN - home med ABLAnemia - Hgb7.5> 5.7 > 6.5> 5.7> 6.4 > 6.4(9/3).  Patient states that due to religious beliefs she would still not want any blood products and understands the risks.Pediatric tube blood draws only to limit loss. We will hold off on daily blood draws to limit loss.Cont IV Ferumoxytol, and Aranesp Code status - DNR/DNI.   Note from 8/26 "We discussed that given patients dropping hgb, she is at increased risk for decompensation that could lead to a cardiac event or death. We  discussed code status in detail. Patient wishes to be DNR/DNI. We filled out a MOST form that was signed by the patient and placed in her paper chart as well as scanned into the media section. Her code status was changed on electronic records as well." Malnutrition - prealbumin <5to 25.3,on 1/2TPN. Wean off when tolerating soft diet  L popliteal vein and left posterior tibial veins-  Seen by Dr. Myra Gianotti of vascular. S/p IVC filter placement by IR on 9/1 Possible Pyelonephritis on CT- UA and Ucx negative   Perforated diverticulitis with w/feculent peritonitisand abdominal wall abscess - s/p open sigmoid colectomy and end colostomy (Hartmann's procedure), small bowel resection, incision and drainage of abdominal wall abscess with debridement of skin and subcutaneous fatty tissueby Dr. Donell Beers - 8/24  Path   A. COLON, SIGMOID, RESECTION: - Segment of colon (25 cm) showing diverticulosis, diverticulitis with associated abscesses, serositis and serosal adhesions    - Serosal adhesions to portion of benign ovary    - Endometriosis    B. SMALL BOWEL, SEGMENT OF ILEUM, RESECTION:    - Segment of small intestine (11.5 cm) with serositis and serosal adhesions    -CT8/30withPelvic fluid measuring 6.5 x 3.2 x 2.8 cm.On IV abx. IR unable to place drain 9/1. Appears to be decompressing through wound vac, leukocytosis improving. Plan for repeat CT 9/6 to reevaluate.  -Cont1/2TPN today. Increase Ensure to TID and encourage PO intake (currently tolerating about 25% of her meals). Plan to d/c TPN when tolerating more PO  - WOCN following for new ostomyand wound vac.Change m/w/f. Output from vac purulent  - Mobilize. PT/OT.Recommending HH  - Pulm toilet, IS  ID -[email or web address]/23.Zosyn 8/23- 8/28.Diflucan 8/24-8/28. Zosyn 8/31 >> VTE -SCDs,hold lovenox2/2 anemia- has IVC filter FEN -Soft+ensure.   Cont1/2TPN(will consider weaning TPN off once tolerating soft  diet) Foley -Dc'd 8/26 Follow up -Dr. Donell Beers   LOS: 12 days    Kandis Cocking, [facility] 12/06/2019, 9:07 AM Please see Amion for pager number during day hours 7:00am-4:30pm

## 2019-12-06 NOTE — Progress Notes (Signed)
PHARMACY - TOTAL PARENTERAL NUTRITION CONSULT NOTE  Indication: Intolerance to enteral feeding  Patient Measurements: Height: 5' 6.5" (168.9 cm) Weight: 90 kg (198 lb 6.6 oz) IBW/kg (Calculated) : 60.44 TPN AdjBW (KG): 67.8 Body mass index is 31.55 kg/m.  Assessment:  [name] with recurrent diverticulitis who presented with worsening abdominal pain. She was recently admitted on 8/1 through 8/9 with complicated diverticulitis and multiple abscesses and underwent percutaneous drain x2 placement by IR on 8/3. CT abd on 8/23 showed numerous fistulous connections between the sigmoid colon and abscess cavity.  Planning ex-lap, partial colectomy, colostomy and I&D of abdominal wall abscess, so Pharmacy consulted to manage TPN due to poor enteral nutrition in setting of severe malnutrition.  Glucose / Insulin: A1c 5.5% - CBGs<150.  Used 8 units mSSI in past 24 hrs. Electrolytes: 9/4 labs: Na 134, Mg 1.9, others WNL (lytes trending down with 1/2 TPN) Renal: SCr < 1, BUN WNL LFTs / TGs:  LFTs / tbili / TG WNL  Prealbumin / albumin: BL prealbumin <5 up to 25.3 on 8/30, albumin 2.1, TG 200 (8/30) Intake / Output; MIVF: UOP 0.8 ml/kg/hr, drain 9mL; LBM 9/3, improving po intake discussed with surgery, to cont 1/2 rate for 1 more day, possibly off tomorrow.  ID: Zosyn (8/31 >> ) for IAI, possible pyelonephritis, abscess - afebrile, WBC WNL GI Imaging:  8/30 CT - pSBO vs ileus, pelvic fluid, abscess, cholelithiasis 9/2 CT - no change, suggesting abscess Surgeries / Procedures:  8/24 - open sigmoid colectomy and end colostomy, SBR, I&D of abdominal wall abscess.  9/1 - IVC placement for age indeterminate DVT  Central access: PICC placed 11/26/19 TPN start date: 11/26/19   Nutritional Goals (per RD rec on 8/30): 2100-2300 kCal, 125-135g protein, >2.1L fluid per day  Current Nutrition:  TPN (1/2 TPN since 8/30) Ensure Surgery BID (1 charted given) Soft diet - up to 20-100% of meals charted as given.  Pt endorses eating pancakes + spaghetti yesterday, hard boiled eggs for breakfast this AM and ordered pizza for lunch.  Plan:  Continue half TPN for today per Surgery - TPN at 50 ml/hr provides 64g AA, 140g CHO and 31g ILE for a total of 1046 kCal, meeting 50% of patient needs Electrolytes in TPN: incr Na 171mEq/L, K 69mEq/L, Ca 68mEq/L, incr Mag 12 mEq/L, Phos 47mmol/L, Cl:Ac 1:1 (Na and Mag increased 9/4)  Add standard MVI and trace elements to TPN Continue moderate SSI Q4H F/U AM labs oral intake to possibly wean off of TPN   Thank you for allowing pharmacy to be a part of this patient's care.  Georgina Pillion, PharmD, BCPS Clinical Pharmacist Clinical phone for 12/06/2019: [phone] 12/06/2019 7:21 AM   **Pharmacist phone directory can now be found on [email or web address] ([email or web address]).  Listed under [facility].

## 2019-12-07 LAB — GLUCOSE, CAPILLARY
Glucose-Capillary: 112 mg/dL — ABNORMAL HIGH (ref 70–99)
Glucose-Capillary: 120 mg/dL — ABNORMAL HIGH (ref 70–99)
Glucose-Capillary: 121 mg/dL — ABNORMAL HIGH (ref 70–99)
Glucose-Capillary: 125 mg/dL — ABNORMAL HIGH (ref 70–99)
Glucose-Capillary: 133 mg/dL — ABNORMAL HIGH (ref 70–99)
Glucose-Capillary: 134 mg/dL — ABNORMAL HIGH (ref 70–99)

## 2019-12-07 MED ORDER — TRAVASOL 10 % IV SOLN
INTRAVENOUS | Status: AC
  Filled 2019-12-07: qty 636

## 2019-12-07 NOTE — Progress Notes (Addendum)
PHARMACY - TOTAL PARENTERAL NUTRITION CONSULT NOTE  Indication: Intolerance to enteral feeding  Patient Measurements: Height: 5' 6.5" (168.9 cm) Weight: 90 kg (198 lb 6.6 oz) IBW/kg (Calculated) : 60.44 TPN AdjBW (KG): 67.8 Body mass index is 31.55 kg/m.  Assessment:  7 YOF with recurrent diverticulitis who presented with worsening abdominal pain. She was recently admitted on 8/1 through 8/9 with complicated diverticulitis and multiple abscesses and underwent percutaneous drain x2 placement by IR on 8/3. CT abd on 8/23 showed numerous fistulous connections between the sigmoid colon and abscess cavity.  Planning ex-lap, partial colectomy, colostomy and I&D of abdominal wall abscess, so Pharmacy consulted to manage TPN due to poor enteral nutrition in setting of severe malnutrition.  Glucose / Insulin: A1c 5.5% - CBGs<150.  Used 10 units mSSI in past 24 hrs. Electrolytes: 9/4 labs: Na 134, Mg 1.9, others WNL (lytes trending down with 1/2 TPN) Renal: SCr < 1, BUN WNL LFTs / TGs:  LFTs / tbili / TG WNL  Prealbumin / albumin: BL prealbumin <5 up to 25.3 on 8/30, albumin 2.1, TG 200 (8/30) Intake / Output; MIVF: UOP 0.8 ml/kg/hr, drain 82mL; LBM 9/3, improving po intake discussed with surgery, to cont 1/2 rate again ID: Zosyn (8/31 >> ) for IAI, possible pyelonephritis, abscess - afebrile, WBC WNL GI Imaging:  8/30 CT - pSBO vs ileus, pelvic fluid, abscess, cholelithiasis 9/2 CT - no change, suggesting abscess LBM 9/3 Surgeries / Procedures:  8/24 - open sigmoid colectomy and end colostomy, SBR, I&D of abdominal wall abscess.  9/1 - IVC placement for age indeterminate DVT  Central access: PICC placed 11/26/19 TPN start date: 11/26/19   Nutritional Goals (per RD rec on 8/30): 2100-2300 kCal, 125-135g protein, >2.1L fluid per day  Current Nutrition:  TPN (1/2 TPN since 8/30) Ensure Surgery BID (1 charted given) Soft diet - up to 20-100% of meals charted as given. 9/4: Pt endorses eating  pancakes + spaghetti yesterday, hard boiled eggs for breakfast this AM and ordered pizza for lunch.  Plan:  -Continue half TPN, TPN at 50 ml/hr provides 64g AA, 140g CHO and 31g ILE for a total of 1046 kCal, meeting 50% of patient needs -Electrolytes in TPN: incr Na 169mEq/L, K 64mEq/L, Ca 77mEq/L, incr Mag 14 mEq/L, Phos 50mmol/L, Cl:Ac 1:1   Mg increased 9/5 -Add standard MVI and trace elements to TPN -Continue moderate SSI Q4H -F/u labs and CT on Monday to possibly wean off of TPN    Baldemar Friday 12/07/2019 7:32 AM

## 2019-12-07 NOTE — Progress Notes (Addendum)
[facility] Progress Note  12 Days Post-Op  Subjective: CC-   In good spirits.  Still with limited po intake. Limiting blood draws because of her being Jehovah's witness.  Objective: Vital signs in last 24 hours: Temp:  [98.1 F (36.7 C)-98.6 F (37 C)] 98.1 F (36.7 C) (09/05 0443) Pulse Rate:  [93-105] 105 (09/05 0443) Resp:  [16-17] 17 (09/05 0443) BP: (93-109)/(59-73) 109/73 (09/05 0443) SpO2:  [99 %-100 %] 99 % (09/05 0443) Last BM Date: 12/05/19  Intake/Output from previous day: 09/04 0701 - 09/05 0700 In: 2200.3 [P.O.:240; I.V.:1716; IV Piggyback:244.4] Out: 800 [Urine:800] Intake/Output this shift: No intake/output data recorded.  PE: Gen: Alert, NAD, pleasant HEENT: EOM's intact, pupils equal and round Card: RRR Pulm: CTAB, no W/R/R,  Abd: Soft, nontender, +BS   Colostomy viable with new pouch in place. Midline wound with VAC.  The drainage is brownish.   Wound - 9/3 - with VAC off  Lab Results:  Recent Labs    12/05/19 0354  WBC 11.7*  HGB 6.4*  HCT 19.2*  PLT 458*   BMET Recent Labs    12/06/19 0347  NA 134*  K 4.1  CL 99  CO2 28  GLUCOSE 125*  BUN 10  CREATININE 0.61  CALCIUM 8.4*   PT/INR No results for input(s): LABPROT, INR in the last 72 hours. CMP     Component Value Date/Time   NA 134 (L) 12/06/2019 0347   K 4.1 12/06/2019 0347   CL 99 12/06/2019 0347   CO2 28 12/06/2019 0347   GLUCOSE 125 (H) 12/06/2019 0347   BUN 10 12/06/2019 0347   CREATININE 0.61 12/06/2019 0347   CALCIUM 8.4 (L) 12/06/2019 0347   PROT 5.8 (L) 12/04/2019 0335   ALBUMIN 2.1 (L) 12/04/2019 0335   AST 20 12/04/2019 0335   ALT 14 12/04/2019 0335   ALKPHOS 86 12/04/2019 0335   BILITOT 1.1 12/04/2019 0335   GFRNONAA >60 12/06/2019 0347   GFRAA >60 12/06/2019 0347   Lipase  No results found for: LIPASE     Studies/Results: No results found.  Anti-infectives: Anti-infectives (From admission, onward)   Start     Dose/Rate Route  Frequency Ordered Stop   12/02/19 0900  piperacillin-tazobactam (ZOSYN) IVPB 3.375 g        3.375 g 12.5 mL/hr over 240 Minutes Intravenous Every 8 hours 12/02/19 0824     11/25/19 1700  fluconazole (DIFLUCAN) IVPB 400 mg        400 mg 100 mL/hr over 120 Minutes Intravenous Every 24 hours 11/25/19 1649 11/29/19 2356   11/24/19 2000  vancomycin (VANCOCIN) IVPB 1000 mg/200 mL premix  Status:  Discontinued        1,000 mg 200 mL/hr over 60 Minutes Intravenous Every 8 hours 11/24/19 1111 11/25/19 1649   11/24/19 1400  piperacillin-tazobactam (ZOSYN) IVPB 3.375 g        3.375 g 12.5 mL/hr over 240 Minutes Intravenous Every 8 hours 11/24/19 1355 11/29/19 2030   11/24/19 1015  ceFEPIme (MAXIPIME) 2 g in sodium chloride 0.9 % 100 mL IVPB        2 g 200 mL/hr over 30 Minutes Intravenous  Once 11/24/19 1003 11/24/19 1130   11/24/19 1015  metroNIDAZOLE (FLAGYL) IVPB 500 mg        500 mg 100 mL/hr over 60 Minutes Intravenous  Once 11/24/19 1003 11/24/19 1400   11/24/19 0945  vancomycin (VANCOCIN) IVPB 1000 mg/200 mL premix  Status:  Discontinued  1,000 mg 200 mL/hr over 60 Minutes Intravenous  Once 11/24/19 0931 11/24/19 0939   11/24/19 0945  vancomycin (VANCOREADY) IVPB 1500 mg/300 mL        1,500 mg 150 mL/hr over 120 Minutes Intravenous  Once 11/24/19 0939 11/24/19 1418       Assessment/Plan HTN - home med ABLAnemia - Hgb7.5> 5.7 > 6.5> 5.7> 6.4 > 6.4(9/3).  Patient states that due to religious beliefs she would still not want any blood products and understands the risks.Pediatric tube blood draws only to limit loss. We will hold off on daily blood draws to limit loss.  Cont IV Ferumoxytol, and Aranesp  Check CBC on 9/6 Code status - DNR/DNI.   Note from 8/26 "We discussed that given patients dropping hgb, she is at increased risk for decompensation that could lead to a cardiac event or death. We discussed code status in detail. Patient wishes to be DNR/DNI. We filled out a  MOST form that was signed by the patient and placed in her paper chart as well as scanned into the media section. Her code status was changed on electronic records as well." Malnutrition - prealbumin <5to 25.3,on 1/2TPN.   Wean off when tolerating soft diet.  Still limited po intake - will leave TPN until after CT scan tomorrow and reassess. L popliteal vein and left posterior tibial veins-  Seen by Dr. Myra Gianotti of vascular.   S/p IVC filter placement by IR on 9/1 Possible Pyelonephritis on CT- UA and Ucx negative   Perforated diverticulitis with w/feculent peritonitisand abdominal wall abscess  - s/p open sigmoid colectomy and end colostomy (Hartmann's procedure), small bowel resection, incision and drainage of abdominal wall abscess with debridement of skin and subcutaneous fatty tissueby Dr. Donell Beers - 8/24  Path   A. COLON, SIGMOID, RESECTION: - Segment of colon (25 cm) showing diverticulosis, diverticulitis with associated abscesses, serositis and serosal adhesions    - Serosal adhesions to portion of benign ovary    - Endometriosis    B. SMALL BOWEL, SEGMENT OF ILEUM, RESECTION:    - Segment of small intestine (11.5 cm) with serositis and serosal adhesions    -CT8/30withPelvic fluid measuring 6.5 x 3.2 x 2.8 cm.On IV abx. IR unable to place drain 9/1. Appears to be decompressing through wound vac, leukocytosis improving.    Plan for repeat CT 9/6 to reevaluate.  -Cont1/2TPN today. Increase Ensure to TID and encourage PO intake (currently tolerating about 25% of her meals). Plan to d/c TPN when tolerating more PO  - WOCN following for new ostomyand wound vac.Change m/w/f. Output from vac purulent  - Mobilize. PT/OT.Recommending HH  - Pulm toilet, IS  ID -[email or web address]/23.Zosyn 8/23- 8/28.Diflucan 8/24-8/28.   Zosyn 8/31 >> VTE -SCDs,hold lovenox2/2 anemia- has IVC filter FEN -Soft+ensure.   Cont1/2TPN- will reassess after CT scan  tomorrow  Follow up -Dr. Donell Beers   LOS: 13 days    Kandis Cocking, [facility] 12/07/2019, 8:15 AM Please see Amion for pager number during day hours 7:00am-4:30pm

## 2019-12-08 ENCOUNTER — Inpatient Hospital Stay (HOSPITAL_COMMUNITY): Payer: Commercial Managed Care - PPO

## 2019-12-08 LAB — COMPREHENSIVE METABOLIC PANEL
ALT: 13 U/L (ref 0–44)
AST: 14 U/L — ABNORMAL LOW (ref 15–41)
Albumin: 2.2 g/dL — ABNORMAL LOW (ref 3.5–5.0)
Alkaline Phosphatase: 69 U/L (ref 38–126)
Anion gap: 5 (ref 5–15)
BUN: 11 mg/dL (ref 6–20)
CO2: 29 mmol/L (ref 22–32)
Calcium: 8.4 mg/dL — ABNORMAL LOW (ref 8.9–10.3)
Chloride: 101 mmol/L (ref 98–111)
Creatinine, Ser: 0.58 mg/dL (ref 0.44–1.00)
GFR calc Af Amer: >60 mL/min (ref >60)
GFR calc non Af Amer: >60 mL/min (ref >60)
Glucose, Bld: 110 mg/dL — ABNORMAL HIGH (ref 70–99)
Potassium: 3.6 mmol/L (ref 3.5–5.1)
Sodium: 135 mmol/L (ref 135–145)
Total Bilirubin: 0.3 mg/dL (ref 0.3–1.2)
Total Protein: 5.8 g/dL — ABNORMAL LOW (ref 6.5–8.1)

## 2019-12-08 LAB — CBC WITH DIFFERENTIAL/PLATELET
Abs Immature Granulocytes: 0.04 10*3/uL (ref 0.00–0.07)
Basophils Absolute: 0 10*3/uL (ref 0.0–0.1)
Basophils Relative: 1 %
Eosinophils Absolute: 0.1 10*3/uL (ref 0.0–0.5)
Eosinophils Relative: 2 %
HCT: 20.5 % — ABNORMAL LOW (ref 36.0–46.0)
Hemoglobin: 6.9 g/dL — CL (ref 12.0–15.0)
Immature Granulocytes: 1 %
Lymphocytes Relative: 24 %
Lymphs Abs: 1.8 10*3/uL (ref 0.7–4.0)
MCH: 26.6 pg (ref 26.0–34.0)
MCHC: 33.7 g/dL (ref 30.0–36.0)
MCV: 79.2 fL — ABNORMAL LOW (ref 80.0–100.0)
Monocytes Absolute: 0.6 10*3/uL (ref 0.1–1.0)
Monocytes Relative: 8 %
Neutro Abs: 5 10*3/uL (ref 1.7–7.7)
Neutrophils Relative %: 64 %
Platelets: 374 10*3/uL (ref 150–400)
RBC: 2.59 MIL/uL — ABNORMAL LOW (ref 3.87–5.11)
RDW: 26.5 % — ABNORMAL HIGH (ref 11.5–15.5)
WBC: 7.6 10*3/uL (ref 4.0–10.5)
nRBC: 0 % (ref 0.0–0.2)

## 2019-12-08 LAB — PREALBUMIN: Prealbumin: 20.4 mg/dL (ref 18–38)

## 2019-12-08 LAB — GLUCOSE, CAPILLARY
Glucose-Capillary: 114 mg/dL — ABNORMAL HIGH (ref 70–99)
Glucose-Capillary: 115 mg/dL — ABNORMAL HIGH (ref 70–99)
Glucose-Capillary: 123 mg/dL — ABNORMAL HIGH (ref 70–99)
Glucose-Capillary: 125 mg/dL — ABNORMAL HIGH (ref 70–99)
Glucose-Capillary: 129 mg/dL — ABNORMAL HIGH (ref 70–99)
Glucose-Capillary: 135 mg/dL — ABNORMAL HIGH (ref 70–99)

## 2019-12-08 LAB — MAGNESIUM: Magnesium: 1.9 mg/dL (ref 1.7–2.4)

## 2019-12-08 LAB — TRIGLYCERIDES: Triglycerides: 113 mg/dL (ref <150)

## 2019-12-08 LAB — PHOSPHORUS: Phosphorus: 3.8 mg/dL (ref 2.5–4.6)

## 2019-12-08 MED ORDER — TRAVASOL 10 % IV SOLN
INTRAVENOUS | Status: AC
  Filled 2019-12-08: qty 636

## 2019-12-08 MED ORDER — IOHEXOL 9 MG/ML PO SOLN
500.0000 mL | ORAL | Status: AC
  Administered 2019-12-08 (×2): 500 mL via ORAL

## 2019-12-08 MED ORDER — MAGNESIUM SULFATE IN D5W 1-5 GM/100ML-% IV SOLN
1.0000 g | Freq: Once | INTRAVENOUS | Status: AC
  Administered 2019-12-08: 1 g via INTRAVENOUS
  Filled 2019-12-08: qty 100

## 2019-12-08 MED ORDER — IOHEXOL 300 MG/ML  SOLN
100.0000 mL | Freq: Once | INTRAMUSCULAR | Status: AC | PRN
  Administered 2019-12-08: 100 mL via INTRAVENOUS

## 2019-12-08 NOTE — Progress Notes (Addendum)
[facility] Progress Note  13 Days Post-Op  Subjective: CC-   In good spirits.  PO intake better.  Limiting blood draws because of her being Jehovah's witness.  Objective: Vital signs in last 24 hours: Temp:  [98.2 F (36.8 C)-98.7 F (37.1 C)] 98.3 F (36.8 C) (09/06 0353) Pulse Rate:  [72-97] 97 (09/06 0353) Resp:  [16-18] 18 (09/06 0353) BP: (98-103)/(60-63) 103/63 (09/06 0353) SpO2:  [99 %-100 %] 99 % (09/06 0353) Last BM Date: 12/05/19  Intake/Output from previous day: 09/05 0701 - 09/06 0700 In: 1074.3 [P.O.:360; I.V.:579.7; IV Piggyback:134.6] Out: 12 [Urine:2; Drains:10] Intake/Output this shift: No intake/output data recorded.  PE: Gen: Alert, NAD, pleasant HEENT: EOM's intact, pupils equal and round Card: RRR Pulm: CTAB, no W/R/R,  Abd: Soft, nontender, +BS   Colostomy viable with new pouch in place. Midline wound with VAC.  To change VAC today   Wound - 9/3 - with VAC off  Lab Results:  Recent Labs    12/08/19 0416  WBC 7.6  HGB 6.9*  HCT 20.5*  PLT 374   BMET Recent Labs    12/06/19 0347 12/08/19 0341  NA 134* 135  K 4.1 3.6  CL 99 101  CO2 28 29  GLUCOSE 125* 110*  BUN 10 11  CREATININE 0.61 0.58  CALCIUM 8.4* 8.4*   PT/INR No results for input(s): LABPROT, INR in the last 72 hours. CMP     Component Value Date/Time   NA 135 12/08/2019 0341   K 3.6 12/08/2019 0341   CL 101 12/08/2019 0341   CO2 29 12/08/2019 0341   GLUCOSE 110 (H) 12/08/2019 0341   BUN 11 12/08/2019 0341   CREATININE 0.58 12/08/2019 0341   CALCIUM 8.4 (L) 12/08/2019 0341   PROT 5.8 (L) 12/08/2019 0341   ALBUMIN 2.2 (L) 12/08/2019 0341   AST 14 (L) 12/08/2019 0341   ALT 13 12/08/2019 0341   ALKPHOS 69 12/08/2019 0341   BILITOT 0.3 12/08/2019 0341   GFRNONAA >60 12/08/2019 0341   GFRAA >60 12/08/2019 0341   Lipase  No results found for: LIPASE     Studies/Results: No results found.  Anti-infectives: Anti-infectives (From  admission, onward)   Start     Dose/Rate Route Frequency Ordered Stop   12/02/19 0900  piperacillin-tazobactam (ZOSYN) IVPB 3.375 g        3.375 g 12.5 mL/hr over 240 Minutes Intravenous Every 8 hours 12/02/19 0824     11/25/19 1700  fluconazole (DIFLUCAN) IVPB 400 mg        400 mg 100 mL/hr over 120 Minutes Intravenous Every 24 hours 11/25/19 1649 11/29/19 2356   11/24/19 2000  vancomycin (VANCOCIN) IVPB 1000 mg/200 mL premix  Status:  Discontinued        1,000 mg 200 mL/hr over 60 Minutes Intravenous Every 8 hours 11/24/19 1111 11/25/19 1649   11/24/19 1400  piperacillin-tazobactam (ZOSYN) IVPB 3.375 g        3.375 g 12.5 mL/hr over 240 Minutes Intravenous Every 8 hours 11/24/19 1355 11/29/19 2030   11/24/19 1015  ceFEPIme (MAXIPIME) 2 g in sodium chloride 0.9 % 100 mL IVPB        2 g 200 mL/hr over 30 Minutes Intravenous  Once 11/24/19 1003 11/24/19 1130   11/24/19 1015  metroNIDAZOLE (FLAGYL) IVPB 500 mg        500 mg 100 mL/hr over 60 Minutes Intravenous  Once 11/24/19 1003 11/24/19 1400   11/24/19 0945  vancomycin (VANCOCIN) IVPB  1000 mg/200 mL premix  Status:  Discontinued        1,000 mg 200 mL/hr over 60 Minutes Intravenous  Once 11/24/19 0931 11/24/19 0939   11/24/19 0945  vancomycin (VANCOREADY) IVPB 1500 mg/300 mL        1,500 mg 150 mL/hr over 120 Minutes Intravenous  Once 11/24/19 0939 11/24/19 1418       Assessment/Plan HTN - home med ABLAnemia - Hgb7.5> 5.7 > 6.5> 5.7> 6.4 > 6.4(9/3).  Patient states that due to religious beliefs she would still not want any blood products and understands the risks.Pediatric tube blood draws only to limit loss. We will hold off on daily blood draws to limit loss.  Cont IV Ferumoxytol, and Aranesp   Hgb - 6.9 - 12/08/2019 Code status - DNR/DNI.   Note from 8/26 "We discussed that given patients dropping hgb, she is at increased risk for decompensation that could lead to a cardiac event or death. We discussed code status in  detail. Patient wishes to be DNR/DNI. We filled out a MOST form that was signed by the patient and placed in her paper chart as well as scanned into the media section. Her code status was changed on electronic records as well." Malnutrition - prealbumin <5to 25.3 (8/30)  On 1/2TPN.   Wean off when tolerating soft diet.  Still limited po intake - will leave TPN until after CT scan  L popliteal vein and left posterior tibial veins-  Seen by Dr. Myra Gianotti of vascular.   S/p IVC filter placement by IR on 9/1 Possible Pyelonephritis on CT- UA and Ucx negative   Perforated diverticulitis with w/feculent peritonitisand abdominal wall abscess  - s/p open sigmoid colectomy and end colostomy (Hartmann's procedure), small bowel resection, incision and drainage of abdominal wall abscess with debridement of skin and subcutaneous fatty tissueby Dr. Donell Beers - 8/24  Path   A. COLON, SIGMOID, RESECTION: - Segment of colon (25 cm) showing diverticulosis, diverticulitis with associated abscesses, serositis and serosal adhesions    - Serosal adhesions to portion of benign ovary    - Endometriosis    B. SMALL BOWEL, SEGMENT OF ILEUM, RESECTION:    - Segment of small intestine (11.5 cm) with serositis and serosal adhesions    -CT8/30withPelvic fluid measuring 6.5 x 3.2 x 2.8 cm.On IV abx. IR unable to place drain 9/1. Appears to be decompressing through wound vac, leukocytosis improving.    Repeat CT 9/6 to reevaluate.  -Cont1/2TPN today. Increase Ensure to TID and encourage PO intake   - WOCN following for new ostomyand wound vac.Change m/w/f. Output from vac purulent  - Mobilize. PT/OT.Recommending HH  - Pulm toilet, IS  ID -[email or web address]/23.Zosyn 8/23- 8/28.Diflucan 8/24-8/28.   Zosyn 8/31 >>  WBC - 6,900 - 12/08/2019 VTE -SCDs,hold lovenox2/2 anemia- has IVC filter FEN -Soft+ensure.   Cont1/2TPN- will reassess after CT scan   Follow up -Dr. Donell Beers    LOS: 14 days   Kandis Cocking, [facility] 12/08/2019, 8:18 AM Please see Amion for pager number during day hours 7:00am-4:30pm

## 2019-12-08 NOTE — Progress Notes (Signed)
Physical Therapy Treatment Patient Details Name: [name] MRN: [id number] DOB: 02-04-1966 Today's Date: 12/08/2019    History of Present Illness 54yo female PMH HTN and recurrent diverticulitis who returned to the ED today with 2 days of worsening abdominal pain.  Patient was admitted 8/1 through 8/9 with complicated diverticulitis and multiple abscesses. CT scan shows significantly increased size of large abscess within the soft tissues of the lower anterior abdominal wall now measuring up to approximately 21 cm, previously 8 cm. Pt is now s/p Open sigmoid colectomy and end colostomy (Hartmann's procedure), small bowel resection, incision and drainage of abdominal wall abscess with debridement of skin and subcutaneous fatty tissue on 8/24. Found to have LLE popliteal and posterior tibial vein DVT, age indeterminate. S/p IVC filter 12/03/2019.    PT Comments    Pt progressing in ambulation distance, speed and support needed, ambulated 500' with IV pole at pace >2.62 ft/sec. Pt also performed exercises in room for strengthening, balance, and posture and was given handout so she could continue on her own in room. Pt noted fatigue by end of session. .follow    Follow Up Recommendations  Home health PT;Supervision for mobility/OOB     Equipment Recommendations  Rolling walker with 5" wheels    Recommendations for Other Services       Precautions / Restrictions Precautions Precautions: None Precaution Comments: colostomy Restrictions Weight Bearing Restrictions: No    Mobility  Bed Mobility Overal bed mobility: Modified Independent Bed Mobility: Sit to Supine;Supine to Sit     Supine to sit: Modified independent (Device/Increase time);HOB elevated Sit to supine: Modified independent (Device/Increase time)   General bed mobility comments: mod I with bed 30 deg  Transfers Overall transfer level: Modified independent Equipment used: None Transfers: Sit to/from Stand Sit to  Stand: Modified independent (Device/Increase time)         General transfer comment: no LOB with stand without AD  Ambulation/Gait Ambulation/Gait assistance: Supervision Gait Distance (Feet): 500 Feet Assistive device: IV Pole Gait Pattern/deviations: Step-through pattern;Decreased stride length Gait velocity: decreased Gait velocity interpretation: >2.62 ft/sec, indicative of community ambulatory General Gait Details: used IV pole to begin weaning off RW, tried short distance without pole but pt felt abdominal discomfort and insecurity from a balance standpoint. Generally erect posture but mild forward head, worked on this in [employer], [employer] (Stroke Patients Only)       Balance Overall balance assessment: Needs assistance Sitting-balance support: Feet supported Sitting balance-Leahy Scale: Good     Standing balance support: No upper extremity supported Standing balance-Leahy Scale: Good Standing balance comment: able to stand without external support statically, reliant on external assist during dynamic standing tasks Single Leg Stance - Right Leg: 10 Single Leg Stance - Left Leg: 5 Tandem Stance - Right Leg: 30 Tandem Stance - Left Leg: 30                    Cognition Arousal/Alertness: Awake/alert Behavior During Therapy: WFL for tasks assessed/performed Overall Cognitive Status: Within Functional Limits for tasks assessed                                        Exercises      General Comments General comments (skin integrity, edema, etc.): Gave pt written HEP  with strengthening: squats, bridges, wall push ups. Balance: tandem stance and SL stance. Posture: scapular retraction, chin tucks, shoulder shrugs. Pt practiced each exercise in room.       Pertinent Vitals/Pain Pain Assessment: Faces Faces Pain Scale: Hurts a little bit Pain Location: abdomen Pain Descriptors /  Indicators: Discomfort Pain Intervention(s): Limited activity within patient's tolerance    Home Living                      Prior Function            PT Goals (current goals can now be found in the care plan section) Acute Rehab PT Goals Patient Stated Goal: home, to return to independence PT Goal Formulation: With patient Time For Goal Achievement: 12/10/19 Potential to Achieve Goals: Good Progress towards PT goals: Progressing toward goals    Frequency    Min 3X/week      PT Plan Current plan remains appropriate    Stein-evaluation              AM-PAC PT "6 Clicks" Mobility   Outcome Measure  Help needed turning from your back to your side while in a flat bed without using bedrails?: None Help needed moving from lying on your back to sitting on the side of a flat bed without using bedrails?: None Help needed moving to and from a bed to a chair (including a wheelchair)?: None Help needed standing up from a chair using your arms (e.g., wheelchair or bedside chair)?: None Help needed to walk in hospital room?: None Help needed climbing 3-5 steps with a railing? : A Little 6 Click Score: 23    End of Session   Activity Tolerance: Patient tolerated treatment well Patient left: with call bell/phone within reach;in bed Nurse Communication: Mobility status PT Visit Diagnosis: Other abnormalities of gait and mobility (R26.89);Muscle weakness (generalized) (M62.81)     Time: 0071-2197 PT Time Calculation (min) (ACUTE ONLY): 26 min  Charges:  $Gait Training: 8-22 mins $Therapeutic Exercise: 8-22 mins                     [name], PT  Acute Rehab Services  Pager [phone] Office [phone]    [name] 12/08/2019, 10:17 AM

## 2019-12-08 NOTE — Consult Note (Signed)
WOC Nurse Consult Note:  Wound follow up for patient seen in room [facility] 6N24 Wound type: Midline surgical abdominal wound.  Wound bed: Pink granulation tissue Drainage (amount, consistency, odor) Clear yellow in canister Measurements: 11.2 cm x 12.8 cm x 3.8 cm tunnel at 1200 Periwound: Intact Dressing procedure/placement/frequency: Removed 3 pieces of black foam from wound bed. Replaced 3 pieces of black foam in wound bed. 3.8 cm tunnel at 1200. This tunnel was filled with a piece of the black foam. Drape applied, immediate seal obtained with 125 mm of suction.   Patient states she has had no BM since colostomy changed on Friday until about an hour ago, therefore colostomy was not changed today. Will FU tomorrow.  Renaldo Reel Katrinka Blazing, MSN, RN, CMSRN, AGCNS, [facility] Wound Treatment Associate Wound, Ostomy, Continence Nurse Office [phone]

## 2019-12-08 NOTE — Progress Notes (Signed)
CRITICAL VALUE ALERT  Critical Value:  hgb 6.9  Date & Time Notied:  12/08/19, 0522  Provider Notified: Gaynelle Adu, MD  Orders Received/Actions taken: will monitor pt.

## 2019-12-08 NOTE — Progress Notes (Signed)
PHARMACY - TOTAL PARENTERAL NUTRITION CONSULT NOTE  Indication: Intolerance to enteral feeding  Patient Measurements: Height: 5' 6.5" (168.9 cm) Weight: 90 kg (198 lb 6.6 oz) IBW/kg (Calculated) : 60.44 TPN AdjBW (KG): 67.8 Body mass index is 31.55 kg/m.  Assessment:  3 YOF with recurrent diverticulitis who presented with worsening abdominal pain. She was recently admitted on 8/1 through 8/9 with complicated diverticulitis and multiple abscesses and underwent percutaneous drain x2 placement by IR on 8/3. CT abd on 8/23 showed numerous fistulous connections between the sigmoid colon and abscess cavity.  Planning ex-lap, partial colectomy, colostomy and I&D of abdominal wall abscess, so Pharmacy consulted to manage TPN due to poor enteral nutrition in setting of severe malnutrition.  Glucose / Insulin: A1c 5.5% - CBGs<150.  Used 10 units mSSI in past 24 hrs. Electrolytes: Na 135, K 3.6, Mg 1.9, Phos 3.8, CoCa 9.7 (lytes trending down with 1/2 TPN) Renal: SCr < 1, BUN WNL LFTs / TGs:  LFTs / tbili / TG WNL  Prealbumin / albumin: BL prealbumin <5 up to 25.3 on 8/30, down slightly to 20.4 on 9/6, albumin 2.2, TG 113 Intake / Output; MIVF: UOP 0.8 ml/kg/hr, drain 59mL; LBM 9/3, improving po intake discussed with surgery 9/6, to cont 1/2 rate again today pending results of CT scan possible d/c tomorrow ID: Zosyn (8/31 >> ) for IAI, possible pyelonephritis, abscess - afebrile, WBC WNL GI Imaging:  8/30 CT - pSBO vs ileus, pelvic fluid, abscess, cholelithiasis 9/2 CT - no change, suggesting abscess 9/6 Abd CT -  Surgeries / Procedures:  8/24 - open sigmoid colectomy and end colostomy, SBR, I&D of abdominal wall abscess.  9/1 - IVC placement for age indeterminate DVT  Central access: PICC placed 11/26/19 TPN start date: 11/26/19   Nutritional Goals (per RD rec on 8/30): 2100-2300 kCal, 125-135g protein, >2.1L fluid per day  Current Nutrition:  TPN (1/2 TPN since 8/30) Ensure Surgery BID (1  charted given) Soft diet - up to 20-100% of meals charted as given.  Pt endorses good appetite, hopeful to d/c TPN soon  Plan:  -Continue half TPN, TPN at 50 ml/hr provides 64g AA, 140g CHO and 31g ILE for a total of 1046 kCal, meeting 50% of patient needs -Electrolytes in TPN:  Na 153mEq/L, incr K 75 mEq/L, Ca 57mEq/L, incr Mag 5  mEq/L, Phos 42mmol/L, Cl:Ac 1:1  - Magnesium 1g IV x 1 today -Add standard MVI and trace elements to TPN -Continue moderate SSI Q4H -F/u CT planned for today, possible wean of TPN to off 9/7  Thank you for allowing pharmacy to be a part of this patient's care.  Georgina Pillion, PharmD, BCPS Clinical Pharmacist Clinical phone for 12/08/2019: [phone] 12/08/2019 9:00 AM   **Pharmacist phone directory can now be found on [email or web address] ([email or web address]).  Listed under [facility].

## 2019-12-09 LAB — GLUCOSE, CAPILLARY
Glucose-Capillary: 103 mg/dL — ABNORMAL HIGH (ref 70–99)
Glucose-Capillary: 110 mg/dL — ABNORMAL HIGH (ref 70–99)
Glucose-Capillary: 121 mg/dL — ABNORMAL HIGH (ref 70–99)
Glucose-Capillary: 125 mg/dL — ABNORMAL HIGH (ref 70–99)
Glucose-Capillary: 128 mg/dL — ABNORMAL HIGH (ref 70–99)
Glucose-Capillary: 133 mg/dL — ABNORMAL HIGH (ref 70–99)
Glucose-Capillary: 140 mg/dL — ABNORMAL HIGH (ref 70–99)

## 2019-12-09 MED ORDER — DIPHENHYDRAMINE HCL 25 MG PO CAPS
25.0000 mg | ORAL_CAPSULE | Freq: Once | ORAL | Status: AC
  Administered 2019-12-09: 25 mg via ORAL
  Filled 2019-12-09: qty 1

## 2019-12-09 MED ORDER — INSULIN ASPART 100 UNIT/ML ~~LOC~~ SOLN: 0.0000 [IU] | Freq: Every day | SUBCUTANEOUS | Status: DC

## 2019-12-09 MED ORDER — INSULIN ASPART 100 UNIT/ML ~~LOC~~ SOLN
0.0000 [IU] | Freq: Three times a day (TID) | SUBCUTANEOUS | Status: DC
  Administered 2019-12-09: 1 [IU] via SUBCUTANEOUS

## 2019-12-09 MED ORDER — ADULT MULTIVITAMIN W/MINERALS CH
1.0000 | ORAL_TABLET | Freq: Every day | ORAL | Status: DC
  Administered 2019-12-10 – 2019-12-12 (×3): 1 via ORAL
  Filled 2019-12-09 (×3): qty 1

## 2019-12-09 NOTE — Discharge Summary (Signed)
Physician Discharge Summary  Patient ID: [name] MRN: [id number] DOB/AGE: [birth year] 55 y.o.  Admit date: 11/24/2019 Discharge date: 12/12/2019  Discharge Diagnoses Perforated diverticulitis with feculent peritonitis and abdominal wall abscess HTN ABL anemia Left popliteal vein and left posterior tibial vein DVT  Consultants Interval radiology Vascular surgery   Procedures Dr. Donell Beers (11/25/19) - Open sigmoid colectomy with end colostomy, small bowel resection, I&D of abdominal wall abscess  Dr. Fredia Sorrow (12/03/19) - IVC filter placement    HPI: [name] is a 54yo female PMH HTN and recurrent diverticulitis who returned to the ED today with 2 days of worsening abdominal pain. Patient was admitted 8/1 through 8/9 with complicated diverticulitis and multiple abscesses. She underwent percutaneous drain x2 placement by IR 11/04/19 - one into the superficial component and one drain intra-pelvic. Cultures grew E coli, strep C, pseudomonas. She was kept on IV zosyn during admission and switched to levaquin and flagyl at discharge. She did return to the ED once with clogged drains. This time, she stated that she started feeling worse about 2 days PTA. The majority of her pain was in her lower abdomen. She was having difficulty ambulating due to the pain. Denied nausea or vomiting. Last BM 3-4 days ago and normal. She reported a poor appetite for several days and general fatigue. Denied fever or chills.  In the ED patient was found to be tachycardic up to 122bpm, temp 99.1, BP 129/86. WBC 22.9. Lactic acid 2.3>>1.2. CT scan shows significantly increased size of large abscess within the soft tissues of the lower anterior abdominal wall now measuring up to approximately 21 cm, previously 8 cm - collection contains a pigtail drainage catheter located along the more superior aspect of this collection; additional catheter containing abscess along the dome of the bladder measures up to 8.1 cm,  previously measured 7.5 cm; changes of chronic diverticulitis within the sigmoid colon with numerous fistulous connections between the sigmoid colon and abscess cavity along the dome of the urinary bladder; mild bilateral hydronephrosis. Patient was admitted to general surgery service.   Hospital Course: Patient underwent surgery on 8/24 as listed above. Patient started on TPN for malnutrition and expected ileus post-operatively. Foley removed POD#2. Pathology came back without any evidence of malignancy. Patient developed ABL anemia post-operatively, as a [employer] she declined any blood products. After lengthy discussion about CODE status patient decided to become DNR/DNI in the event that decompensation occurred. Diet was advanced as tolerated with return in bowel function. TPN weaned off as diet increased. Worsening leukocytosis noted 8/30 and CT abdomen/pelvis showed pelvic fluid collection measuring 6.5 x 3.2 x 2.8 cm. Patient started back on antibiotics and IR was consulted for drainage. CT 8/30 also showed left popliteal vein and left posterior tibial vein DVTs, vascular surgery was consulted and IVC filter placed by IR. IR was unable to place drain for abdominal collection but patient appeared to be decompressing through wound VAC. CT repeated 9/6 and showed persistent collection but no clear abscess, with patient clinically improving and risk of bleeding with procedure, decision made not to pursue drainage at this time. Patient transitioned to PO antibiotics prior to discharge. Patient able to demonstrate ability for stoma care with minimal assistance prior to discharge. PICC line removed prior to discharge. Patient discharge home with home health 12/12/19 in stable condition. Outpatient follow up CT planned prior to surgical follow up appointment.   Physical Exam: General: pleasant, WD, WNfemale who is laying in bed in NAD Heart: regular, rate,  and rhythm. Palpable radial and pedal pulses  bilaterally Lungs: CTAB, no wheezes, rhonchi, or rales noted. Respiratory effort nonlabored Abd: soft,NT, ND, +BS,midline wound as below with healthy granulation tissue, stoma viable with liquid brown stool in bag   I or a member of my team have reviewed this patient in the Controlled Substance Database  Allergies as of 12/12/2019      Reactions   Aspirin    diarheea , stomach ache.       Medication List    STOP taking these medications   levofloxacin 500 MG tablet Commonly known as: Levaquin   metroNIDAZOLE 500 MG tablet Commonly known as: Flagyl   ondansetron 4 MG tablet Commonly known as: ZOFRAN   promethazine 25 MG tablet Commonly known as: PHENERGAN   sodium chloride flush 0.9 % Soln Commonly known as: NS     TAKE these medications   acetaminophen 500 MG tablet Commonly known as: TYLENOL Take 2 tablets (1,000 mg total) by mouth every 8 (eight) hours as needed for mild pain, fever or headache.   ALPRAZolam 0.5 MG tablet Commonly known as: XANAX Take 0.5 mg by mouth 3 (three) times daily as needed for anxiety.   amoxicillin-clavulanate 875-125 MG tablet Commonly known as: AUGMENTIN Take 1 tablet by mouth every 12 (twelve) hours for 14 days.   ferrous sulfate 325 (65 FE) MG tablet Take 1 tablet (325 mg total) by mouth 2 (two) times daily with a meal.   hyoscyamine 0.125 MG tablet Commonly known as: LEVSIN Take 0.125 mg by mouth every 6 (six) hours as needed for bladder spasms or cramping.   losartan-hydrochlorothiazide 50-12.5 MG tablet Commonly known as: HYZAAR Take 1 tablet by mouth daily.   methocarbamol 500 MG tablet Commonly known as: ROBAXIN Take 1 tablet (500 mg total) by mouth every 6 (six) hours as needed for muscle spasms.   ondansetron 4 MG disintegrating tablet Commonly known as: ZOFRAN-ODT Take 1 tablet (4 mg total) by mouth every 6 (six) hours as needed for nausea.   oxyCODONE 5 MG immediate release tablet Commonly known as: Oxy  IR/ROXICODONE Take 1-2 tablets (5-10 mg total) by mouth every 6 (six) hours as needed for moderate pain or severe pain. What changed:   how much to take  reasons to take this   polyethylene glycol 17 g packet Commonly known as: MIRALAX / GLYCOLAX Take 17 g by mouth daily as needed for mild constipation.   saccharomyces boulardii 250 MG capsule Commonly known as: FLORASTOR Take 1 capsule (250 mg total) by mouth 2 (two) times daily.            Durable Medical Equipment  (From admission, onward)         Start     Ordered   11/27/19 1022  For home use only DME 3 n 1  Once        11/27/19 1023   11/27/19 1021  For home use only DME Negative pressure wound device  Once       Question Answer Comment  Frequency of dressing change 3 times per week   Length of need 3 Months   Dressing type Foam   Amount of suction 125 mm/Hg   Pressure application Continuous pressure   Supplies 10 canisters and 15 dressings per month for duration of therapy      11/27/19 1023            Follow-up Information    [facility], [facility] Follow up.  Specialty: Home Health Services Contact information: [address] [place] [address] [phone]        Soundra Pilon, FNP Follow up.   Specialty: Family Medicine Why: appointment Thursady December 18, 2019 at 1115 am  Contact information: [address] [place] [address] [phone]        [facility], [facility]. Go on 12/23/2019.   Specialty: General Surgery Why: Follow up scheduled 10:30 AM. Please arrive 30 min prior to appointment time. Bring VAC supplies with you to appointment.  Contact information: [address] [place] [address] [phone]        [facility] Call.   Why: CT abdomen/pelvis should be at the end of next week or monday prior to [facility] appointment. They should call to schedule.  Contact information: [address] [place]  [address] [phone]               Signed: Juliet Rude , [facility] 12/12/2019, 2:55 PM Please see Amion for pager number during day hours 7:00am-4:30pm

## 2019-12-09 NOTE — Progress Notes (Addendum)
PHARMACY - TOTAL PARENTERAL NUTRITION CONSULT NOTE  Indication: Intolerance to enteral feeding  Patient Measurements: Height: 5' 6.5" (168.9 cm) Weight: 90 kg (198 lb 6.6 oz) IBW/kg (Calculated) : 60.44 TPN AdjBW (KG): 67.8 Body mass index is 31.55 kg/m.  Assessment:  [name] with recurrent diverticulitis who presented with worsening abdominal pain. She was recently admitted on 8/1 through 8/9 with complicated diverticulitis and multiple abscesses and underwent percutaneous drain x2 placement by IR on 8/3. CT abd on 8/23 showed numerous fistulous connections between the sigmoid colon and abscess cavity.  Planning ex-lap, partial colectomy, colostomy and I&D of abdominal wall abscess, so Pharmacy consulted to manage TPN due to poor enteral nutrition in setting of severe malnutrition.  Glucose / Insulin: A1c 5.5% - CBGs<150.  Used 8 units mSSI 9/6. Electrolytes: 9/6 labs - Na 135, K 3.6, Mg 1.9 and 1gm given, Phos 3.8, CoCa 9.7 (lytes trending down with 1/2 TPN) Renal: SCr < 1, BUN WNL LFTs / TGs:  LFTs / tbili / TG WNL  Prealbumin / albumin: BL prealbumin <5 up to 25.3 on 8/30, down slightly to 20.4 on 9/6, albumin 2.2, TG 113 Intake / Output; MIVF: No UOP charted 9/6, drain 36mL; LBM 9/6 ID: Zosyn (8/31 >> ) for IAI, possible pyelonephritis, abscess - afebrile, WBC WNL GI Imaging:  8/30 CT - pSBO vs ileus, pelvic fluid, abscess, cholelithiasis 9/2 CT - no change, suggesting abscess 9/6 abd CT - persistent abscess collection Surgeries / Procedures:  8/24 - open sigmoid colectomy and end colostomy, SBR, I&D of abdominal wall abscess.  9/1 - IVC placement for age indeterminate DVT  Central access: PICC placed 11/26/19 TPN start date: 11/26/19   Nutritional Goals (per RD rec on 8/30): 2100-2300 kCal, 125-135g protein, >2.1L fluid per day  Current Nutrition:  TPN (1/2 TPN since 8/30) Ensure Surgery BID (2 charted given) Soft diet - 75% of meals   Plan:  D/C TPN per Surgery - reduce  TPN to 25 ml/hr for 2 hrs, then stop. Reduce SSI to sensitive TIDwm D/C TPN labs and nursing care orders Zosyn LOT per MD  Danella Penton, Pharmacy Student  [name], PharmD, BCPS, BCCCP 12/09/2019, 10:08 AM

## 2019-12-09 NOTE — Progress Notes (Signed)
[facility] Progress Note  14 Days Post-Op  Subjective: Patient reports improvement in PO intake, she ate 2 chicken quesadillas yesterday. She denies nausea. She still is taking some IV pain medication and we discussed weaning off of this today.   Objective: Vital signs in last 24 hours: Temp:  [98.1 F (36.7 C)-98.6 F (37 C)] 98.2 F (36.8 C) (09/07 0405) Pulse Rate:  [89-99] 96 (09/07 0405) Resp:  [16-18] 16 (09/07 0405) BP: (103-116)/(69-73) 103/73 (09/07 0405) SpO2:  [100 %] 100 % (09/07 0405) Last BM Date: 12/08/19  Intake/Output from previous day: 09/06 0701 - 09/07 0700 In: 2302.8 [P.O.:1080; I.V.:1076.9; IV Piggyback:145.9] Out: 127 [Stool:127] Intake/Output this shift: No intake/output data recorded.  PE: General: pleasant, WD, WN female who is laying in bed in NAD Heart: regular, rate, and rhythm. Palpable radial and pedal pulses bilaterally Lungs: CTAB, no wheezes, rhonchi, or rales noted.  Respiratory effort nonlabored Abd: soft, NT, ND, +BS, VAC to midline with serous looking drainage in cannister, stoma viable with liquid brown stool in bag    Lab Results:  Recent Labs    12/08/19 0416  WBC 7.6  HGB 6.9*  HCT 20.5*  PLT 374   BMET Recent Labs    12/08/19 0341  NA 135  K 3.6  CL 101  CO2 29  GLUCOSE 110*  BUN 11  CREATININE 0.58  CALCIUM 8.4*   PT/INR No results for input(s): LABPROT, INR in the last 72 hours. CMP     Component Value Date/Time   NA 135 12/08/2019 0341   K 3.6 12/08/2019 0341   CL 101 12/08/2019 0341   CO2 29 12/08/2019 0341   GLUCOSE 110 (H) 12/08/2019 0341   BUN 11 12/08/2019 0341   CREATININE 0.58 12/08/2019 0341   CALCIUM 8.4 (L) 12/08/2019 0341   PROT 5.8 (L) 12/08/2019 0341   ALBUMIN 2.2 (L) 12/08/2019 0341   AST 14 (L) 12/08/2019 0341   ALT 13 12/08/2019 0341   ALKPHOS 69 12/08/2019 0341   BILITOT 0.3 12/08/2019 0341   GFRNONAA >60 12/08/2019 0341   GFRAA >60 12/08/2019 0341   Lipase  No  results found for: LIPASE     Studies/Results: CT ABDOMEN PELVIS W CONTRAST  Result Date: 12/08/2019 CLINICAL DATA:  Abdominal pain. EXAM: CT ABDOMEN AND PELVIS WITH CONTRAST TECHNIQUE: Multidetector CT imaging of the abdomen and pelvis was performed using the standard protocol following bolus administration of intravenous contrast. CONTRAST:  OMNIPAQUE IOHEXOL 300 MG/ML  SOLN COMPARISON:  CT abdomen dated 12/01/2019 FINDINGS: Lower chest: No acute abnormality. Hepatobiliary: No focal liver abnormality is seen. Probable gallstones and/or sludge within the gallbladder. No pericholecystic fluid or inflammation. No bile duct dilatation. Pancreas: Unremarkable. No pancreatic ductal dilatation or surrounding inflammatory changes. Spleen: Normal in size without focal abnormality. Adrenals/Urinary Tract: Adrenal glands appear normal. Kidneys are unremarkable without mass, stone or hydronephrosis. No perinephric fluid. No ureteral or bladder calculi are identified. Stomach/Bowel: Surgical changes of a partial colectomy and LEFT abdominal wall colostomy creation. No dilated large or small bowel loops. Stomach is unremarkable, partially decompressed. Scattered diverticulosis of the remaining colon but no focal inflammatory change to suggest acute diverticulitis. Appendix is normal. Vascular/Lymphatic: No acute appearing vascular abnormality. IVC filter in place with tip appropriately positioned at the level of the renal veins. No enlarged lymph nodes seen. Reproductive: Presumed hysterectomy.  No adnexal mass. Other: Persistent complex fluid collection, presumed abscess collection, within the midline pelvis, just above the level of the bladder,  measuring 7.2 x 2.6 x 2.6 cm, abutting the dome of the bladder and extending anteriorly to the posterior margin of the midline abdominal rectus musculature. There is associated thickening of the upper walls of the bladder. No new fluid collection.  No free intraperitoneal  air. Musculoskeletal: No acute appearing osseous abnormality. IMPRESSION: 1. Persistent abscess collection within the midline pelvis, just above the level of the bladder, measuring 7.2 x 2.6 x 2.6 cm, abutting the dome of the bladder and extending anteriorly to the posterior margin of the midline abdominal rectus musculature, not significantly changed in size/extent compared to the most recent CT of 12/01/2019. 2. This pelvic abscess collection again abuts the dome of the bladder and there is associated thickening of the upper walls of the bladder, but no discrete fistula is seen through the bladder wall. 3. Surgical changes of a partial colectomy and LEFT abdominal wall colostomy creation. No bowel obstruction or evidence of bowel wall inflammation. 4. Colonic diverticulosis without evidence of acute diverticulitis. 5. Cholelithiasis without evidence of acute cholecystitis. Electronically Signed   By: Bary Richard M.D.   On: 12/08/2019 13:36    Anti-infectives: Anti-infectives (From admission, onward)   Start     Dose/Rate Route Frequency Ordered Stop   12/02/19 0900  piperacillin-tazobactam (ZOSYN) IVPB 3.375 g        3.375 g 12.5 mL/hr over 240 Minutes Intravenous Every 8 hours 12/02/19 0824     11/25/19 1700  fluconazole (DIFLUCAN) IVPB 400 mg        400 mg 100 mL/hr over 120 Minutes Intravenous Every 24 hours 11/25/19 1649 11/29/19 2356   11/24/19 2000  vancomycin (VANCOCIN) IVPB 1000 mg/200 mL premix  Status:  Discontinued        1,000 mg 200 mL/hr over 60 Minutes Intravenous Every 8 hours 11/24/19 1111 11/25/19 1649   11/24/19 1400  piperacillin-tazobactam (ZOSYN) IVPB 3.375 g        3.375 g 12.5 mL/hr over 240 Minutes Intravenous Every 8 hours 11/24/19 1355 11/29/19 2030   11/24/19 1015  ceFEPIme (MAXIPIME) 2 g in sodium chloride 0.9 % 100 mL IVPB        2 g 200 mL/hr over 30 Minutes Intravenous  Once 11/24/19 1003 11/24/19 1130   11/24/19 1015  metroNIDAZOLE (FLAGYL) IVPB 500 mg         500 mg 100 mL/hr over 60 Minutes Intravenous  Once 11/24/19 1003 11/24/19 1400   11/24/19 0945  vancomycin (VANCOCIN) IVPB 1000 mg/200 mL premix  Status:  Discontinued        1,000 mg 200 mL/hr over 60 Minutes Intravenous  Once 11/24/19 0931 11/24/19 0939   11/24/19 0945  vancomycin (VANCOREADY) IVPB 1500 mg/300 mL        1,500 mg 150 mL/hr over 120 Minutes Intravenous  Once 11/24/19 0939 11/24/19 1418       Assessment/Plan HTN - home med ABLAnemia - Hgb6.9.Patient states that due to religious beliefs she would still not want any blood products and understands the risks.Pediatric tube blood draws only to limit loss. We will hold off on daily blood draws to limit loss.Cont IV Ferumoxytol, and Aranesp Code status - DNR/DNI. Note from 8/26 "We discussed that given patients dropping hgb, she is at increased risk for decompensation that could lead to a cardiac event or death. We discussed code status in detail. Patient wishes to be DNR/DNI. We filled out a MOST form that was signed by the patient and placed in her paper chart as  well as scanned into the media section. Her code status was changed on electronic records as well." Malnutrition - prealbumin 20.4 yesterday and PO intake improving, d/c TPN L popliteal vein and left posterior tibial veins-Seen by Dr. Myra Gianotti of vascular.S/p IVC filter placement by IR on 9/1 Possible Pyelonephritis on CT- UAand Ucx negative  Perforated diverticulitis with w/feculent peritonitisand abdominal wall abscess - s/p open sigmoid colectomy and end colostomy (Hartmann's procedure), small bowel resection, incision and drainage of abdominal wall abscess with debridement of skin and subcutaneous fatty tissueby Dr. Donell Beers 8/24 - POD #14 -CT8/30withPelvic fluid measuring 6.5 x 3.2 x 2.8 cm.On IV abx. IRunable to place drain 9/1. Appeared to be decompressing through wound vac, leukocytosis improved - repeat CT yesterday with persistent pelvic  collection 7.2 x 2.6 x 2.6 cm. May need IR to reevaluate for drainage, will discuss with attending  -Patient reports increased PO intake - d/c TPN  - WOCN following for new ostomyand wound vac.Change m/w/f. Output from vac looks clear yellow today - Mobilize. PT/OT.Recommending HH - Pulm toilet, IS - Path without evidence of malignancy. As noted below  ID -maxipime/flagylx1.vancomycin8/23.Zosyn 8/23- 8/28.Diflucan 8/24-8/28. Zosyn 8/31 >> VTE -SCDs,hold lovenox2/2 anemia FEN -Soft+ensure Foley -Dc'd 8/26 Follow up -Dr. Donell Beers  Path A. COLON, SIGMOID, RESECTION:  - Segment of colon (25 cm) showing diverticulosis, diverticulitis with  associated abscesses, serositis and serosal adhesions  - Serosal adhesions to portion of benign ovary  - Endometriosis  - Margins appear viable   B. SMALL BOWEL, SEGMENT OF ILEUM, RESECTION:  - Segment of small intestine (11.5 cm) with serositis and serosal  adhesions  - Margins appear viable   LOS: 15 days    Juliet Rude , [facility] Surgery 12/09/2019, 8:36 AM Please see Amion for pager number during day hours 7:00am-4:30pm

## 2019-12-09 NOTE — Consult Note (Signed)
   [facility] CM Inpatient Consult   12/09/2019  [name] 10-04-1965 [id number]   [facility] [[facility]]  Accountable Care Organization [ACO] Patient:  [name] PPO  Patient screened for high risk score for unplanned readmission with less than 30 days readmission and length of stay hospitalization [15 day] to check if potential [facility]  [[facility]] Care Management service needs.    Primary Care Provider is Soundra Pilon, FNP in [facility] this provider is listed to provide the transition of care [TOC] for post hospital follow up.   Plan:  Patient is currently for home with [facility] for wound care [VAC].  Continue to follow progress and disposition to assess for post hospital care management needs.    Please place a [facility] Care Management consult as appropriate and for questions contact:   Charlesetta Shanks, RN BSN CCM [facility]  [phone] business mobile phone Toll free office [phone]  Fax number: [phone] [email or web address]

## 2019-12-09 NOTE — Plan of Care (Signed)

## 2019-12-09 NOTE — Progress Notes (Signed)
Nutrition Follow-up  RD working remotely.  DOCUMENTATION CODES:   Severe malnutrition in context of acute illness/injury  INTERVENTION:   -Continue Ensure Surgery TID, each supplement provides 330 kcals and 18 grams protein -Continue Magic cup TID with meals, each supplement provides 290 kcal and 9 grams of protein -MVI with minerals daily (to start 12/10/19)  NUTRITION DIAGNOSIS:   Severe Malnutrition related to acute illness (perforated diverticulitis with intra-abdominal abscess) as evidenced by mild fat depletion, moderate fat depletion, mild muscle depletion, moderate muscle depletion, percent weight loss.  Ongoing  GOAL:   Patient will meet greater than or equal to 90% of their needs  Progressing  MONITOR:   PO intake, Supplement acceptance, Labs, Weight trends, Skin, I & O's  REASON FOR ASSESSMENT:   Consult New TPN/TNA  ASSESSMENT:   [name] is a 54yo female PMH HTN and recurrent diverticulitis who returned to the ED today with 2 days of worsening abdominal pain.  8/24- s/pPROCEDURE PERFORMED:Open sigmoid colectomy and end colostomy (Hartmann's procedure), small bowel resection, incision and drainage of abdominal wall abscess with debridement of skin and subcutaneous fatty tissue 8/25- s/p PICC, TPN initiated 8/26- advanced to clear liquid diet, abdominal wound vac placed 8/27- advanced to full liquid diet 8/29- advanced to soft diet 9/1- s/p IVC filterplacement  Reviewed I/O's: +2.2 L x 24 hours and +5.3 L since 11/25/19  Colostomy output: 127 ml x 24 hours  Per pharmacy note, plan to d/c TPN today.  Attempted to speak with pt via call to hospital room phone, however, no answer.   Pt with improved appetite; noted meal completion 75-100%. Pt continues to consume Ensure Surgery supplements.   Labs reviewed: CBGS: 103-133 (inpatient orders for glycemic control are 0-5 units insulin aspart daily at bedtime and 0-9 units insulin aspart TID with  meals,).   Diet Order:   Diet Order            DIET SOFT Room service appropriate? Yes with Assist; Fluid consistency: Thin  Diet effective now                 EDUCATION NEEDS:   Education needs have been addressed  Skin:  Skin Assessment: Skin Integrity Issues: Skin Integrity Issues:: Wound VAC Wound Vac: abdomen Incisions: closed abdomen  Last BM:  12/08/19 (via colostomy)  Height:   Ht Readings from Last 1 Encounters:  11/25/19 5' 6.5" (1.689 m)    Weight:   Wt Readings from Last 1 Encounters:  11/25/19 90 kg    Ideal Body Weight:  60.2 kg  BMI:  Body mass index is 31.55 kg/m.  Estimated Nutritional Needs:   Kcal:  2100-2300  Protein:  120-135 grams  Fluid:  > 2.1 L    Levada Schilling, RD, LDN, CDCES Registered Dietitian II Certified Diabetes Care and Education Specialist Please refer to [facility] for RD and/or RD on-call/weekend/after hours pager

## 2019-12-10 LAB — GLUCOSE, CAPILLARY
Glucose-Capillary: 105 mg/dL — ABNORMAL HIGH (ref 70–99)
Glucose-Capillary: 109 mg/dL — ABNORMAL HIGH (ref 70–99)
Glucose-Capillary: 101 mg/dL — ABNORMAL HIGH (ref 70–99)
Glucose-Capillary: 96 mg/dL (ref 70–99)

## 2019-12-10 MED ORDER — HYDROMORPHONE HCL 1 MG/ML IJ SOLN
0.5000 mg | Freq: Four times a day (QID) | INTRAMUSCULAR | Status: DC | PRN
  Administered 2019-12-10 – 2019-12-12 (×8): 0.5 mg via INTRAVENOUS
  Filled 2019-12-10 (×8): qty 1

## 2019-12-10 MED ORDER — OXYCODONE HCL 5 MG PO TABS
5.0000 mg | ORAL_TABLET | Freq: Four times a day (QID) | ORAL | Status: DC | PRN
  Administered 2019-12-10: 10 mg via ORAL
  Administered 2019-12-10: 5 mg via ORAL
  Administered 2019-12-11 – 2019-12-12 (×4): 10 mg via ORAL
  Filled 2019-12-10 (×6): qty 2
  Filled 2019-12-10: qty 1

## 2019-12-10 NOTE — Plan of Care (Signed)

## 2019-12-10 NOTE — Progress Notes (Signed)
Physical Therapy Treatment Patient Details Name: [name] MRN: [id number] DOB: October 21, 1965 Today's Date: 12/10/2019    History of Present Illness 54yo female PMH HTN and recurrent diverticulitis who returned to the ED today with 2 days of worsening abdominal pain.  Patient was admitted 8/1 through 8/9 with complicated diverticulitis and multiple abscesses. CT scan shows significantly increased size of large abscess within the soft tissues of the lower anterior abdominal wall now measuring up to approximately 21 cm, previously 8 cm. Pt is now s/p Open sigmoid colectomy and end colostomy (Hartmann's procedure), small bowel resection, incision and drainage of abdominal wall abscess with debridement of skin and subcutaneous fatty tissue on 8/24. Found to have LLE popliteal and posterior tibial vein DVT, age indeterminate. S/p IVC filter 12/03/2019.    PT Comments    Pt tolerated session well.  Reports she feels groggy from med but unable to tell from observation.  Pt progressed to stair training.  Continue to recommend return home with HHPT.    Follow Up Recommendations  Home health PT;Supervision for mobility/OOB     Equipment Recommendations  Rolling walker with 5" wheels    Recommendations for Other Services       Precautions / Restrictions Precautions Precautions: None Precaution Comments: colostomy Restrictions Weight Bearing Restrictions: No    Mobility  Bed Mobility Overal bed mobility: Modified Independent Bed Mobility: Sit to Supine;Supine to Sit              Transfers Overall transfer level: Modified independent Equipment used: None                Ambulation/Gait Ambulation/Gait assistance: Supervision Gait Distance (Feet): 350 Feet (last 100 ft with no device) Assistive device: IV Pole;None Gait Pattern/deviations: Step-through pattern;Decreased stride length Gait velocity: decreased   General Gait Details: used IV pole for part of distance then  progressed to no device.  Cues for scap retraction, upper trunk control and reciprocal armswing.   Stairs Stairs: Yes Stairs assistance: Supervision Stair Management: One rail Right Number of Stairs: 15 General stair comments: Cues for sequencing and safety.   Wheelchair Mobility    Modified Rankin (Stroke Patients Only)       Balance                                            Cognition Arousal/Alertness: Awake/alert Behavior During Therapy: WFL for tasks assessed/performed Overall Cognitive Status: Within Functional Limits for tasks assessed                                        Exercises      General Comments        Pertinent Vitals/Pain Pain Assessment: Faces Pain Score: 6  Pain Location: abdomen Pain Descriptors / Indicators: Discomfort Pain Intervention(s): Monitored during session;Repositioned    Home Living                      Prior Function            PT Goals (current goals can now be found in the care plan section) Acute Rehab PT Goals Patient Stated Goal: home, to return to independence Time For Goal Achievement: 12/24/19 Potential to Achieve Goals: Good Progress towards PT goals: Progressing toward goals  Frequency    Min 3X/week      PT Plan Current plan remains appropriate    Co-evaluation              AM-PAC PT "6 Clicks" Mobility   Outcome Measure  Help needed turning from your back to your side while in a flat bed without using bedrails?: None Help needed moving from lying on your back to sitting on the side of a flat bed without using bedrails?: None Help needed moving to and from a bed to a chair (including a wheelchair)?: None Help needed standing up from a chair using your arms (e.g., wheelchair or bedside chair)?: None Help needed to walk in hospital room?: None Help needed climbing 3-5 steps with a railing? : None 6 Click Score: 24    End of Session Equipment  Utilized During Treatment: Gait belt Activity Tolerance: Patient tolerated treatment well Patient left: with call bell/phone within reach;in bed Nurse Communication: Mobility status PT Visit Diagnosis: Other abnormalities of gait and mobility (R26.89);Muscle weakness (generalized) (M62.81)     Time: 1950-9326 PT Time Calculation (min) (ACUTE ONLY): 16 min  Charges:  $Gait Training: 8-22 mins                     [name] , PTA Acute Rehabilitation Services Pager [phone] Office [phone]     [name] 12/10/2019, 4:05 PM

## 2019-12-10 NOTE — TOC Initial Note (Addendum)
Transition of Care [facility]) - Initial/Assessment Note    Patient Details  Name: [name] MRN: [id number] Date of Birth: 10/25/1965  [facility] CM/SW Contact:    Kingsley Plan, RN Phone Number: 12/10/2019, 2:33 PM  Clinical Narrative:                 Spoke to patient at bedside. Possible discharge Friday. WOC nurse has enrolled patient in [facility] program for ostomy supplies. [facility] aware possible discharge Friday , they can start care Monday December 15, 2019 for [facility] change.   Home VAC at bedside. Again explain to patient discharge nurse will contact VAC dressing to home VAC prior to discharge. Patient will take box of supplies home with her for [facility] to use.   Patient needs 3 in1 for home , ordered with Velna Hatchet with [employer]. 3 in1 will come to hospital room prior to discharge .   Patient aware of all of above and voiced understanding.   Scheduled follow up appointment with PCP placed on AVS. Expected Discharge Plan: Home w Home Health Services Barriers to Discharge: Continued Medical Work up   Patient Goals and CMS Choice Patient states their goals for this hospitalization and ongoing recovery are:: to return to home CMS Medicare.gov Compare Post Acute Care list provided to:: Patient Choice offered to / list presented to : Patient  Expected Discharge Plan and Services Expected Discharge Plan: Home w Home Health Services   Discharge Planning Services: CM Consult Post Acute Care Choice: Home Health, Durable Medical Equipment Living arrangements for the past 2 months: Apartment                 DME Arranged: Vac DME Agency: [employer] Date DME Agency Contacted: 11/27/19 Time DME Agency Contacted: 1058 Representative spoke with at DME Agency: Traci HH Arranged: PT, RN HH Agency: [facility] Date [facility] Agency Contacted: 11/27/19 Time HH Agency Contacted: 1059 Representative spoke with at [facility] Agency: Kandee Keen  Prior Living  Arrangements/Services Living arrangements for the past 2 months: Apartment Lives with:: Siblings Patient language and need for interpreter reviewed:: Yes Do you feel safe going back to the place where you live?: Yes      Need for Family Participation in Patient Care: Yes (Comment) Care giver support system in place?: Yes (comment)   Criminal Activity/Legal Involvement Pertinent to Current Situation/Hospitalization: No - Comment as needed  Activities of Daily Living Home Assistive Devices/Equipment: None ADL Screening (condition at time of admission) Patient's cognitive ability adequate to safely complete daily activities?: Yes Is the patient deaf or have difficulty hearing?: No Does the patient have difficulty seeing, even when wearing glasses/contacts?: No Does the patient have difficulty concentrating, remembering, or making decisions?: No Patient able to express need for assistance with ADLs?: Yes Does the patient have difficulty dressing or bathing?: No Independently performs ADLs?: Yes (appropriate for developmental age) Does the patient have difficulty walking or climbing stairs?: No Weakness of Legs: None Weakness of Arms/Hands: None  Permission Sought/Granted   Permission granted to share information with : Yes, Verbal Permission Granted     Permission granted to share info w AGENCY: [facility]        Emotional Assessment Appearance:: Appears stated age Attitude/Demeanor/Rapport: Engaged Affect (typically observed): Accepting Orientation: : Oriented to Situation, Oriented to  Time, Oriented to Place, Oriented to Self Alcohol / Substance Use: Not Applicable Psych Involvement: No (comment)  Admission diagnosis:  Diverticulitis [K57.92] Intra-abdominal abscess [facility]) [K65.1] Patient Active Problem List  Diagnosis Date Noted  . Protein-calorie malnutrition, severe 11/27/2019  . Diverticulitis of large intestine with abscess 11/14/2019  . Essential hypertension 11/14/2019   . Anemia, chronic disease 11/14/2019  . Severe sepsis (HCC) 11/06/2019  . Diverticulitis 11/02/2019  . Intra-abdominal abscess (HCC) 11/02/2019   PCP:  Soundra Pilon, FNP Pharmacy:   [id number] [place],  - [phone] [place] [address] Phone: [phone] Fax: [phone]     Social Determinants of Health (SDOH) Interventions    Readmission Risk Interventions Readmission Risk Prevention Plan 12/10/2019  Transportation Screening Complete  PCP or Specialist Appt within 3-5 Days Complete  HRI or Home Care Consult Complete  Social Work Consult for Recovery Care Planning/Counseling Complete  Palliative Care Screening Not Applicable  Medication Review (RN Care Manager) Referral to Pharmacy  Some recent data might be hidden

## 2019-12-10 NOTE — Progress Notes (Signed)
[facility] Progress Note  15 Days Post-Op  Subjective: Patient reports feeling nervous about caring for herself at home. We discussed that she will have home health RN to help continue home teaching for stoma care. Also discussed that she will not be expected to know how to change the [facility] dressing. Patient reports appetite is good and she is having bowel function.   Objective: Vital signs in last 24 hours: Temp:  [97.3 F (36.3 C)-98.1 F (36.7 C)] 98.1 F (36.7 C) (09/08 0411) Pulse Rate:  [89-98] 97 (09/08 0411) Resp:  [16-18] 16 (09/08 0411) BP: (98-107)/(64-82) 107/72 (09/08 0411) SpO2:  [100 %] 100 % (09/08 0411) Last BM Date: 12/09/19  Intake/Output from previous day: 09/07 0701 - 09/08 0700 In: 720 [P.O.:720] Out: 50 [Stool:50] Intake/Output this shift: No intake/output data recorded.  PE: General: pleasant, WD, WN female who is laying in bed in NAD Heart: regular, rate, and rhythm. Palpable radial and pedal pulses bilaterally Lungs: CTAB, no wheezes, rhonchi, or rales noted.  Respiratory effort nonlabored Abd: soft, NT, ND, +BS, midline wound as below with healthy granulation tissue, stoma viable with liquid brown stool in bag       Lab Results:  Recent Labs    12/08/19 0416  WBC 7.6  HGB 6.9*  HCT 20.5*  PLT 374   BMET Recent Labs    12/08/19 0341  NA 135  K 3.6  CL 101  CO2 29  GLUCOSE 110*  BUN 11  CREATININE 0.58  CALCIUM 8.4*   PT/INR No results for input(s): LABPROT, INR in the last 72 hours. CMP     Component Value Date/Time   NA 135 12/08/2019 0341   K 3.6 12/08/2019 0341   CL 101 12/08/2019 0341   CO2 29 12/08/2019 0341   GLUCOSE 110 (H) 12/08/2019 0341   BUN 11 12/08/2019 0341   CREATININE 0.58 12/08/2019 0341   CALCIUM 8.4 (L) 12/08/2019 0341   PROT 5.8 (L) 12/08/2019 0341   ALBUMIN 2.2 (L) 12/08/2019 0341   AST 14 (L) 12/08/2019 0341   ALT 13 12/08/2019 0341   ALKPHOS 69 12/08/2019 0341   BILITOT 0.3  12/08/2019 0341   GFRNONAA >60 12/08/2019 0341   GFRAA >60 12/08/2019 0341   Lipase  No results found for: LIPASE     Studies/Results: CT ABDOMEN PELVIS W CONTRAST  Result Date: 12/08/2019 CLINICAL DATA:  Abdominal pain. EXAM: CT ABDOMEN AND PELVIS WITH CONTRAST TECHNIQUE: Multidetector CT imaging of the abdomen and pelvis was performed using the standard protocol following bolus administration of intravenous contrast. CONTRAST:  OMNIPAQUE IOHEXOL 300 MG/ML  SOLN COMPARISON:  CT abdomen dated 12/01/2019 FINDINGS: Lower chest: No acute abnormality. Hepatobiliary: No focal liver abnormality is seen. Probable gallstones and/or sludge within the gallbladder. No pericholecystic fluid or inflammation. No bile duct dilatation. Pancreas: Unremarkable. No pancreatic ductal dilatation or surrounding inflammatory changes. Spleen: Normal in size without focal abnormality. Adrenals/Urinary Tract: Adrenal glands appear normal. Kidneys are unremarkable without mass, stone or hydronephrosis. No perinephric fluid. No ureteral or bladder calculi are identified. Stomach/Bowel: Surgical changes of a partial colectomy and LEFT abdominal wall colostomy creation. No dilated large or small bowel loops. Stomach is unremarkable, partially decompressed. Scattered diverticulosis of the remaining colon but no focal inflammatory change to suggest acute diverticulitis. Appendix is normal. Vascular/Lymphatic: No acute appearing vascular abnormality. IVC filter in place with tip appropriately positioned at the level of the renal veins. No enlarged lymph nodes seen. Reproductive: Presumed hysterectomy.  No adnexal mass. Other: Persistent complex fluid collection, presumed abscess collection, within the midline pelvis, just above the level of the bladder, measuring 7.2 x 2.6 x 2.6 cm, abutting the dome of the bladder and extending anteriorly to the posterior margin of the midline abdominal rectus musculature. There is associated  thickening of the upper walls of the bladder. No new fluid collection.  No free intraperitoneal air. Musculoskeletal: No acute appearing osseous abnormality. IMPRESSION: 1. Persistent abscess collection within the midline pelvis, just above the level of the bladder, measuring 7.2 x 2.6 x 2.6 cm, abutting the dome of the bladder and extending anteriorly to the posterior margin of the midline abdominal rectus musculature, not significantly changed in size/extent compared to the most recent CT of 12/01/2019. 2. This pelvic abscess collection again abuts the dome of the bladder and there is associated thickening of the upper walls of the bladder, but no discrete fistula is seen through the bladder wall. 3. Surgical changes of a partial colectomy and LEFT abdominal wall colostomy creation. No bowel obstruction or evidence of bowel wall inflammation. 4. Colonic diverticulosis without evidence of acute diverticulitis. 5. Cholelithiasis without evidence of acute cholecystitis. Electronically Signed   By: Bary Richard M.D.   On: 12/08/2019 13:36    Anti-infectives: Anti-infectives (From admission, onward)   Start     Dose/Rate Route Frequency Ordered Stop   12/02/19 0900  piperacillin-tazobactam (ZOSYN) IVPB 3.375 g        3.375 g 12.5 mL/hr over 240 Minutes Intravenous Every 8 hours 12/02/19 0824     11/25/19 1700  fluconazole (DIFLUCAN) IVPB 400 mg        400 mg 100 mL/hr over 120 Minutes Intravenous Every 24 hours 11/25/19 1649 11/29/19 2356   11/24/19 2000  vancomycin (VANCOCIN) IVPB 1000 mg/200 mL premix  Status:  Discontinued        1,000 mg 200 mL/hr over 60 Minutes Intravenous Every 8 hours 11/24/19 1111 11/25/19 1649   11/24/19 1400  piperacillin-tazobactam (ZOSYN) IVPB 3.375 g        3.375 g 12.5 mL/hr over 240 Minutes Intravenous Every 8 hours 11/24/19 1355 11/29/19 2030   11/24/19 1015  ceFEPIme (MAXIPIME) 2 g in sodium chloride 0.9 % 100 mL IVPB        2 g 200 mL/hr over 30 Minutes  Intravenous  Once 11/24/19 1003 11/24/19 1130   11/24/19 1015  metroNIDAZOLE (FLAGYL) IVPB 500 mg        500 mg 100 mL/hr over 60 Minutes Intravenous  Once 11/24/19 1003 11/24/19 1400   11/24/19 0945  vancomycin (VANCOCIN) IVPB 1000 mg/200 mL premix  Status:  Discontinued        1,000 mg 200 mL/hr over 60 Minutes Intravenous  Once 11/24/19 0931 11/24/19 0939   11/24/19 0945  vancomycin (VANCOREADY) IVPB 1500 mg/300 mL        1,500 mg 150 mL/hr over 120 Minutes Intravenous  Once 11/24/19 6606 11/24/19 1418       Assessment/Plan HTN - home med ABLAnemia - Hgb6.9 (9/6).Patient states that due to religious beliefs she would still not want any blood products and understands the risks.Pediatric tube blood draws only to limit loss. We will hold off on daily blood draws to limit loss.Cont IV Ferumoxytol, and Aranesp Code status - DNR/DNI. Note from 8/26 "We discussed that given patients dropping hgb, she is at increased risk for decompensation that could lead to a cardiac event or death. We discussed code status in detail. Patient  wishes to be DNR/DNI. We filled out a MOST form that was signed by the patient and placed in her paper chart as well as scanned into the media section. Her code status was changed on electronic records as well." Malnutrition - prealbumin 20.4 9/6 and PO intake improving, d/c TPN L popliteal vein and left posterior tibial veins-Seen by Dr. Myra Gianotti of vascular.S/p IVC filter placement by IR on 9/1 Possible Pyelonephritis on CT- UAand Ucx negative  Perforated diverticulitis with w/feculent peritonitisand abdominal wall abscess - s/p open sigmoid colectomy and end colostomy (Hartmann's procedure), small bowel resection, incision and drainage of abdominal wall abscess with debridement of skin and subcutaneous fatty tissueby Dr. Donell Beers 8/24 - POD #15 -CT8/30withPelvic fluid measuring 6.5 x 3.2 x 2.8 cm.On IV abx. IRunable to place drain 9/1. Appeared to be  decompressing through wound vac, leukocytosis improved - repeat CT yesterday with persistent pelvic collection 7.2 x 2.6 x 2.6 cm - pt clinically improving still and risk of bleeding with procedure, keep on abx and monitor for now -Patient reports increased PO intake - TPN stopped 9/7 - WOCN following for new ostomyand wound vac.Change MWF - Mobilize. PT/OT.Recommending HH - Pulm toilet, IS - Path without evidence of malignancy. As noted below  ID -maxipime/flagylx1.vancomycin8/23.Zosyn 8/23- 8/28.Diflucan 8/24-8/28. Zosyn 8/31 >> VTE -SCDs,hold lovenox2/2 anemia FEN -Soft+ensure Foley -Dc'd 8/26 Follow up -Dr. Donell Beers  Dispo: repeat labs in AM. Transition to PO abx. Likely home Friday.   Path A. COLON, SIGMOID, RESECTION:  - Segment of colon (25 cm) showing diverticulosis, diverticulitis with  associated abscesses, serositis and serosal adhesions  - Serosal adhesions to portion of benign ovary  - Endometriosis  - Margins appear viable   B. SMALL BOWEL, SEGMENT OF ILEUM, RESECTION:  - Segment of small intestine (11.5 cm) with serositis and serosal  adhesions  - Margins appear viable   LOS: 16 days    Juliet Rude , [facility] 12/10/2019, 8:14 AM Please see Amion for pager number during day hours 7:00am-4:30pm

## 2019-12-10 NOTE — Progress Notes (Addendum)
Occupational Therapy Treatment Patient Details Name: [name] MRN: [id number] DOB: [birth year] Today's Date: 12/10/2019    History of present illness 54yo female PMH HTN and recurrent diverticulitis who returned to the ED today with 2 days of worsening abdominal pain.  Patient was admitted 8/1 through 8/9 with complicated diverticulitis and multiple abscesses. CT scan shows significantly increased size of large abscess within the soft tissues of the lower anterior abdominal wall now measuring up to approximately 21 cm, previously 8 cm. Pt is now s/p Open sigmoid colectomy and end colostomy (Hartmann's procedure), small bowel resection, incision and drainage of abdominal wall abscess with debridement of skin and subcutaneous fatty tissue on 8/24. Found to have LLE popliteal and posterior tibial vein DVT, age indeterminate. S/p IVC filter 12/03/2019.   OT comments  Pt progressing towards acute OT goals. Focus of session was review of LB ADL strategies, practicing functional transfers and mobilizing in the room. Pt up to recliner at end of session. D/c plan remains appropriate.    Follow Up Recommendations  No OT follow up;Other (comment) (inital 24/7)    Equipment Recommendations  3 in 1 bedside commode    Recommendations for Other Services      Precautions / Restrictions Precautions Precautions: None Precaution Comments: colostomy       Mobility Bed Mobility Overal bed mobility: Modified Independent                Transfers Overall transfer level: Modified independent Equipment used: None Transfers: Sit to/from Stand Sit to Stand: Modified independent (Device/Increase time)         General transfer comment: from EOB to recliner    Balance Overall balance assessment: Needs assistance   Sitting balance-Leahy Scale: Good     Standing balance support: Single extremity supported Standing balance-Leahy Scale: Good Standing balance comment: drowsy from side effect of  pain med per pt report. Utilized IV pole to walk in the room this session.                            ADL either performed or assessed with clinical judgement   ADL Overall ADL's : Needs assistance/impaired                         Toilet Transfer: Supervision/safety;RW           Functional mobility during ADLs: Supervision/safety;Rolling walker General ADL Comments: Completed bed mobility and simulated toilet transfer, mobilized in the room. Up to recliner at end of session. Reviewed LB ADL strategies.      Vision       Perception     Praxis      Cognition Arousal/Alertness: Awake/alert Behavior During Therapy: WFL for tasks assessed/performed Overall Cognitive Status: Within Functional Limits for tasks assessed                                          Exercises     Shoulder Instructions       General Comments      Pertinent Vitals/ Pain       Pain Assessment: Faces Faces Pain Scale: Hurts a little bit Pain Location: abdomen Pain Descriptors / Indicators: Discomfort Pain Intervention(s): Monitored during session;Repositioned  Home Living  Prior Functioning/Environment              Frequency  Min 2X/week        Progress Toward Goals  OT Goals(current goals can now be found in the care plan section)  Progress towards OT goals: Progressing toward goals  Acute Rehab OT Goals Patient Stated Goal: home, to return to independence OT Goal Formulation: With patient Time For Goal Achievement: 12/24/19 Potential to Achieve Goals: Good ADL Goals Pt Will Perform Lower Body Bathing: with modified independence;sit to/from stand Pt Will Perform Lower Body Dressing: with modified independence;sit to/from stand Pt Will Transfer to Toilet: with modified independence;ambulating Pt Will Perform Toileting - Clothing Manipulation and hygiene: with modified  independence;sit to/from stand  Plan Discharge plan remains appropriate    Co-evaluation                 AM-PAC OT "6 Clicks" Daily Activity     Outcome Measure   Help from another person eating meals?: None Help from another person taking care of personal grooming?: None Help from another person toileting, which includes using toliet, bedpan, or urinal?: A Little Help from another person bathing (including washing, rinsing, drying)?: A Little Help from another person to put on and taking off regular upper body clothing?: A Little Help from another person to put on and taking off regular lower body clothing?: A Little 6 Click Score: 20    End of Session Equipment Utilized During Treatment: Other (comment) (IV pole to steady)  OT Visit Diagnosis: Pain;Unsteadiness on feet (R26.81)   Activity Tolerance Patient tolerated treatment well;Other (comment) (drowsy from meds per pt report)   Patient Left in chair;with call bell/phone within reach   Nurse Communication          Time: [phone] OT Time Calculation (min): 15 min  Charges: OT General Charges $OT Visit: 1 Visit OT Treatments $Self Care/Home Management : 8-22 mins  Raynald Kemp, OT Acute Rehabilitation Services Pager: [phone] Office: [phone]    Pilar Grammes 12/10/2019, 11:22 AM

## 2019-12-10 NOTE — Consult Note (Signed)
WOC Nurse wound follow up Patient receiving care in [facility] 6N24. PA, Trixie Deis, at bedside for wound assessment. Wound type: healing abdominal surgical wound Measurement: 12.2 cm x 13.3 cm x 3.2 cm; tunnel at 12 o'clock measures 2.6 cm. Wound bed: 100% beefy red granulation tissue Drainage (amount, consistency, odor) serous in cannister Periwound: intact Dressing procedure/placement/frequency: 3 pieces of black foam removed; 3 pieces of black foam placed; immediate seal obtained.   WOC Nurse ostomy follow up Stoma type/location: LUQ colostomy Stomal assessment/size: 1 1/4 inches, more rounded today Peristomal assessment: intact Treatment options for stomal/peristomal skin: barrier ring Output: soft brown stool  Ostomy pouching: 1pc. Education provided: Patient performed entire pouch removal, application process with very little assistance from me. Enrolled patient in [place] Secure Start Discharge program: Yes, previously. Helmut Muster, RN, MSN, CWOCN, CNS-BC, pager [phone]

## 2019-12-11 LAB — BASIC METABOLIC PANEL
Anion gap: 10 (ref 5–15)
BUN: 8 mg/dL (ref 6–20)
CO2: 26 mmol/L (ref 22–32)
Calcium: 8.5 mg/dL — ABNORMAL LOW (ref 8.9–10.3)
Chloride: 100 mmol/L (ref 98–111)
Creatinine, Ser: 0.7 mg/dL (ref 0.44–1.00)
GFR calc Af Amer: >60 mL/min (ref >60)
GFR calc non Af Amer: >60 mL/min (ref >60)
Glucose, Bld: 96 mg/dL (ref 70–99)
Potassium: 3.2 mmol/L — ABNORMAL LOW (ref 3.5–5.1)
Sodium: 136 mmol/L (ref 135–145)

## 2019-12-11 LAB — CBC
HCT: 24.1 % — ABNORMAL LOW (ref 36.0–46.0)
Hemoglobin: 8 g/dL — ABNORMAL LOW (ref 12.0–15.0)
MCH: 26.7 pg (ref 26.0–34.0)
MCHC: 33.2 g/dL (ref 30.0–36.0)
MCV: 80.3 fL (ref 80.0–100.0)
Platelets: 289 10*3/uL (ref 150–400)
RBC: 3 MIL/uL — ABNORMAL LOW (ref 3.87–5.11)
RDW: 25 % — ABNORMAL HIGH (ref 11.5–15.5)
WBC: 7.2 10*3/uL (ref 4.0–10.5)
nRBC: 0 % (ref 0.0–0.2)

## 2019-12-11 LAB — GLUCOSE, CAPILLARY
Glucose-Capillary: 89 mg/dL (ref 70–99)
Glucose-Capillary: 106 mg/dL — ABNORMAL HIGH (ref 70–99)

## 2019-12-11 MED ORDER — AMOXICILLIN-POT CLAVULANATE 875-125 MG PO TABS
1.0000 | ORAL_TABLET | Freq: Two times a day (BID) | ORAL | Status: DC
  Administered 2019-12-11 – 2019-12-12 (×3): 1 via ORAL
  Filled 2019-12-11 (×3): qty 1

## 2019-12-11 NOTE — Discharge Instructions (Signed)
[facility], [place] [phone]  OPEN ABDOMINAL SURGERY: POST OP INSTRUCTIONS  Always review your discharge instruction sheet given to you by the facility where your surgery was performed.  IF YOU HAVE DISABILITY OR FAMILY LEAVE FORMS, YOU MUST BRING THEM TO THE OFFICE FOR PROCESSING.  PLEASE DO NOT GIVE THEM TO YOUR DOCTOR.  1. A prescription for pain medication may be given to you upon discharge.  Take your pain medication as prescribed, if needed.  If narcotic pain medicine is not needed, then you may take acetaminophen (Tylenol) or ibuprofen (Advil) as needed. 2. Take your usually prescribed medications unless otherwise directed. 3. If you need a refill on your pain medication, please contact your pharmacy. They will contact our office to request authorization.  Prescriptions will not be filled after 5pm or on week-ends. 4. You should follow a light diet the first few days after arrival home, such as soup and crackers, pudding, etc.unless your doctor has advised otherwise. A high-fiber, low fat diet can be resumed as tolerated.   Be sure to include lots of fluids daily. Most patients will experience some swelling and bruising on the chest and neck area.  Ice packs will help.  Swelling and bruising can take several days to resolve 5. Most patients will experience some swelling and bruising in the area of the incision. Ice pack will help. Swelling and bruising can take several days to resolve..  6. It is common to experience some constipation if taking pain medication after surgery.  Increasing fluid intake and taking a stool softener will usually help or prevent this problem from occurring.  A mild laxative (Milk of Magnesia or Miralax) should be taken according to package directions if there are no bowel movements after 48 hours. 7.  You may have steri-strips (small skin tapes) in place directly over the incision.  These strips should be left on the skin for 7-10 days.  If your  surgeon used skin glue on the incision, you may shower in 24 hours.  The glue will flake off over the next 2-3 weeks.  Any sutures or staples will be removed at the office during your follow-up visit. You may find that a light gauze bandage over your incision may keep your staples from being rubbed or pulled. You may shower and replace the bandage daily. 8. ACTIVITIES:  You may resume regular (light) daily activities beginning the next day--such as daily self-care, walking, climbing stairs--gradually increasing activities as tolerated.  You may have sexual intercourse when it is comfortable.  Refrain from any heavy lifting or straining until approved by your doctor. a. You may drive when you no longer are taking prescription pain medication, you can comfortably wear a seatbelt, and you can safely maneuver your car and apply brakes  9. You should see your doctor in the office for a follow-up appointment approximately two weeks after your surgery.  Make sure that you call for this appointment within a day or two after you arrive home to insure a convenient appointment time.   WHEN TO CALL YOUR DOCTOR: 1. Fever over 101.0 2. Inability to urinate 3. Nausea and/or vomiting 4. Extreme swelling or bruising 5. Continued bleeding from incision. 6. Increased pain, redness, or drainage from the incision. 7. Difficulty swallowing or breathing 8. Muscle cramping or spasms. 9. Numbness or tingling in hands or feet or around lips.  The clinic staff is available to answer your questions during regular business hours.  Please don't hesitate to call and ask to speak to one of the nurses if you have concerns.  For further questions, please visit [email or web address]   Colostomy Home Guide, Adult  Colostomy surgery is done to create an opening in the front of the abdomen for stool (feces) to leave the body through an ostomy (stoma). Part of the large intestine is attached to the stoma. A bag, also  called a pouch, is fitted over the stoma. Stool and gas will collect in the bag. After surgery, you will need to empty and change your colostomy bag as needed. You will also need to care for your stoma. How to care for the stoma Your stoma should look pink, red, and moist, like the inside of your cheek. Soon after surgery, the stoma may be swollen, but this swelling will go away within 6 weeks. To care for the stoma:  Keep the skin around the stoma clean and dry.  Use a clean, soft washcloth to gently wash the stoma and the skin around it. Clean using a circular motion, and wipe away from the stoma opening, not toward it. ? Use warm water and only use cleansers recommended by your health care provider. ? Rinse the stoma area with plain water. ? Dry the area around the stoma well.  Use stoma powder or ointment on your skin only as told by your health care provider. Do not use any other powders, gels, wipes, or creams on the skin around the stoma.  Check the stoma area every day for signs of infection. Check for: ? New or worsening redness, swelling, or pain. ? New or increased fluid or blood. ? Pus or warmth.  Measure the stoma opening regularly and record the size. Watch for changes. (It is normal for the stoma to get smaller as swelling goes away.) Share this information with your health care provider. How to empty the colostomy bag  Empty your bag at bedtime and whenever it is one-third to one-half full. Do not let the bag get more than half-full with stool or gas. The bag could leak if it gets too full. Some colostomy bags have a built-in gas release valve that releases gas often throughout the day. Follow these basic steps: 1. Wash your hands with soap and water. 2. Sit far back on the toilet seat. 3. Put several pieces of toilet paper into the toilet water. This will prevent splashing as you empty stool into the toilet. 4. Remove the clip or the hook-and-loop fastener from the tail  end of the bag. 5. Unroll the tail, then empty the stool into the toilet. 6. Clean the tail with toilet paper or a moist towelette. 7. Reroll the tail, and close it with the clip or the hook-and-loop fastener. 8. Wash your hands again. How to change the colostomy bag Change your bag every 3-4 days or as often as told by your health care provider. Also change the bag if it is leaking or separating from the skin, or if your skin around the stoma looks or feels irritated. Irritated skin may be a sign that the bag is leaking. Always have colostomy supplies with you, and follow these basic steps: 1. Wash your hands with soap and water. Have paper towels or tissues nearby to clean any discharge. 2. Remove the old bag and skin barrier. Use your fingers or a warm cloth to gently push the skin away from the barrier. 3. Clean the stoma area with water or with mild soap  and water, as directed. Use water to rinse away any soap. 4. Dry the skin. You may use the cool setting on a hair dryer to do this. 5. Use a tracing pattern (template) to cut the skin barrier to the size needed. 6. If you are using a two-piece bag, attach the bag and the skin barrier to each other. Add the barrier ring, if you use one. 7. If directed, apply stoma powder or skin barrier gel to the skin. 8. Warm the skin barrier with your hands, or blow with a hair dryer for 5-10 seconds. 9. Remove the paper from the adhesive strip of the skin barrier. 10. Press the adhesive strip onto the skin around the stoma. 11. Gently rub the skin barrier onto the skin. This creates heat that helps the barrier to stick. 12. Apply stoma tape to the edges of the skin barrier, if desired. 13. Wash your hands again. General recommendations  Avoid wearing tight clothes or having anything press directly on your stoma or bag. Change your clothing whenever it is soiled or damp.  You may shower or bathe with the bag on or off. Do not use harsh or oily soaps  or lotions. Dry the skin and bag after bathing.  Store all supplies in a cool, dry place. Do not leave supplies in extreme heat because some parts can melt or not stick as well.  Whenever you leave home, take extra clothing and an extra skin barrier and bag with you.  If your bag gets wet, you can dry it with a hair dryer on the cool setting.  To prevent odor, you may put drops of ostomy deodorizer in the bag.  If recommended by your health care provider, put ostomy lubricant inside the bag. This helps stool to slide out of the bag more easily and completely. Contact a health care provider if:  You have new or worsening redness, swelling, or pain around your stoma.  You have new or increased fluid or blood coming from your stoma.  Your stoma feels warm to the touch.  You have pus coming from your stoma.  Your stoma extends in or out farther than normal.  You need to change your bag every day.  You have a fever. Get help right away if:  Your stool is bloody.  You have nausea or you vomit.  You have trouble breathing. Summary  Measure your stoma opening regularly and record the size. Watch for changes.  Empty your bag at bedtime and whenever it is one-third to one-half full. Do not let the bag get more than half-full with stool or gas.  Change your bag every 3-4 days or as often as told by your health care provider.  Whenever you leave home, take extra clothing and an extra skin barrier and bag with you. This information is not intended to replace advice given to you by your health care provider. Make sure you discuss any questions you have with your health care provider. Document Revised: 07/10/2018 Document Reviewed: 09/13/2016 Elsevier Patient Education  2020 [employer]   Negative Pressure Wound Therapy Home Guide - Home health RN will change this dressing, but this is just for you to refer to if questions Negative pressure wound therapy (NPWT) uses a sponge or  foam-like material (dressing) placed on or inside the wound. The wound is then covered and sealed with a cover dressing that sticks to your skin (is adhesive). This keeps air out. A tube is attached to the  cover dressing, and this tube connects to a small pump. The pump sucks fluid and germs from the wound. NPWT helps to increase blood flow to the wound and heal it from the inside. What are the risks? NPWT is usually safe to use. However, problems can occur, including:  Skin irritation from the dressing adhesive.  Bleeding.  Infection.  Dehydration. Wounds with large amounts of drainage can cause excessive fluid loss.  Pain. Supplies needed:  A disposable garbage bag.  Soap and water, or hand sanitizer.  Wound cleanser or salt-water solution (saline).  New sponge and cover dressing.  Protective clothing.  Gauze pad.  Vinyl gloves.  Tape.  Skin protectant. This may be a wipe, film, or spray.  Clean or germ-free (sterile) scissors.  Eye protection. How to change your dressing Prepare to change your dressing  1. If told by your health care provider, take pain medicine 30 minutes before changing the dressing. 2. Wash your hands with soap and water. Dry your hands with a clean towel. If soap and water are not available, use hand sanitizer. 3. Set up a clean station for wound care. 4. Open the dressing package so that the sponge dressing remains on the inside of the package. 5. Wear gloves, protective clothing, and eye protection. Remove old dressing  1. Turn off the pump and disconnect the tubing from the dressing. 2. Carefully remove the adhesive cover dressing in the direction of your hair growth. 3. Remove the sponge dressing that is inside the wound. If the sponge sticks, use a wound cleanser or saline solution to wet the sponge and help it come off more easily. 4. Throw the old sponge and cover dressing supplies into the garbage bag. 5. Remove your gloves by grabbing  the cuff and turning the glove inside out. Place the gloves in the trash immediately. 6. Wash your hands with soap and water. Dry your hands with a clean towel. If soap and water are not available, use hand sanitizer. Clean your wound  Wear gloves, protective clothing, and eye protection. Follow your health care provider's instructions on how to clean your wound. You may be told to: 1. Clean the wound using a saline solution or a wound cleanser and a clean gauze pad. 2. Pat the wound dry with a gauze pad. Do not rub the wound. 3. Throw the gauze pad into the garbage bag. 4. Remove your gloves by grabbing the cuff and turning the glove inside out. Place the gloves in the trash immediately. 5. Wash your hands with soap and water. Dry your hands with a clean towel. If soap and water are not available, use hand sanitizer. Apply new dressing  Wear gloves, protective clothing, and eye protection. 1. If told by your health care provider, apply a skin protectant to any skin that will be exposed to adhesive. Let the skin protectant dry. 2. Cut a piece of new sponge dressing and put it on or in the wound. 3. Using clean scissors, cut a nickel-sized hole in the new cover dressing. 4. Apply the cover dressing. 5. Attach the suction tube over the hole in the cover dressing. 6. Take off your gloves. Put them in the plastic bag with the old dressing. Tie the bag shut and throw it away. 7. Wash your hands with soap and water. Dry your hands with a clean towel. If soap and water are not available, use hand sanitizer. 8. Turn the pump back on. The sponge dressing should collapse. Do not  change the settings on the machine without talking to a health care provider. 9. Replace the container in the pump that collects fluid if it is full. Replace the container per the manufacturer's instructions or at least once a week, even if it is not full. General tips and recommendations If the alarm sounds:  Stay  calm.  Do not turn off the pump or do anything with the dressing.  Reasons the alarm may go off: ? The battery is low. Change the battery or plug the device into electrical power. ? The dressing has a leak. Find the leak and put tape over the leak. ? The fluid collection container is full. Change the fluid container.  Call your health care provider right away if you cannot fix the problem.  Explain to your health care provider what is happening. Follow his or her instructions. General instructions  Do not turn off the pump unless told to do so by your health care provider.  Do not turn off the pump for more than 2 hours. If the pump is off for more than 2 hours, the dressing will need to be changed.  If your health care provider says it is okay to shower: ? Do not take the pump into the shower. ? Make sure the wound dressing is protected and sealed. The wound dressing must stay dry.  Check frequently that the machine indicates that therapy is on and that all clamps are open.  Do not use over-the-counter medicated or antiseptic creams, sprays, liquids, or dressings unless your health care provider approves. Contact a health care provider if:  You have new pain.  You develop irritation, a rash, or itching around the wound or dressing.  You see new black or yellow tissue in your wound.  The dressing changes are painful or cause bleeding.  The pump has been off for more than 2 hours, and you do not know how to change the dressing.  The pump alarm goes off, and you do not know what to do. Get help right away if:  You have a lot of bleeding.  The wound breaks open.  You have severe pain.  You have signs of infection, such as: ? More redness, swelling, or pain. ? More fluid or blood. ? Warmth. ? Pus or a bad smell. ? Red streaks leading from the wound. ? A fever.  You see a sudden change in the color or texture of the drainage.  You have signs of dehydration, such  as: ? Little or no tears, urine, or sweat. ? Muscle cramps. ? Very dry mouth. ? Headache. ? Dizziness. Summary  Negative pressure wound therapy (NPWT) is a device that helps your wound heal.  Set up a clean station for wound care. Your health care provider will tell you what supplies to use.  Follow your health care provider's instructions on how to clean your wound and how to change the dressing.  Contact a health care provider if you have new pain, an irritation, or a rash, or if the alarm goes off and you do not know what to do.  Get help right away if you have a lot of bleeding, your wound breaks open, or you have severe pain. Also, get help if you have signs of infection. This information is not intended to replace advice given to you by your health care provider. Make sure you discuss any questions you have with your health care provider. Document Revised: 07/12/2018 Document Reviewed: 06/07/2018 Elsevier Patient  Education  2020 [employer] Inc.  

## 2019-12-11 NOTE — Plan of Care (Signed)
  Problem: Education: Goal: Knowledge of General Education information will improve Description: Including pain rating scale, medication(s)/side effects and non-pharmacologic comfort measures Outcome: Progressing   Problem: Activity: Goal: Risk for activity intolerance will decrease Outcome: Progressing   Problem: Nutrition: Goal: Adequate nutrition will be maintained Outcome: Progressing   

## 2019-12-11 NOTE — Progress Notes (Signed)
[facility] Progress Note  16 Days Post-Op  Subjective: Patient reports she is feeling well, eating well. Has not had a lot of stool output since yesterday but not feeling distended or nauseated. Discussed plans for likely discharge tomorrow.   Objective: Vital signs in last 24 hours: Temp:  [98.1 F (36.7 C)-98.6 F (37 C)] 98.4 F (36.9 C) (09/09 0537) Pulse Rate:  [81-88] 88 (09/09 0537) Resp:  [14-16] 16 (09/09 0537) BP: (95-109)/(61-69) 95/64 (09/09 0537) SpO2:  [100 %] 100 % (09/09 0537) Last BM Date: 12/09/19  Intake/Output from previous day: 09/08 0701 - 09/09 0700 In: -  Out: 400 [Urine:400] Intake/Output this shift: No intake/output data recorded.  PE: General: pleasant, WD, WN female who is laying in bed in NAD Heart: regular, rate, and rhythm. Palpable radial and pedal pulses bilaterally Lungs: CTAB, no wheezes, rhonchi, or rales noted.  Respiratory effort nonlabored Abd: soft, NT, ND, +BS, VAC to midline with serous looking drainage in cannister, stoma viable with small amount brown stool in bag   Lab Results:  No results for input(s): WBC, HGB, HCT, PLT in the last 72 hours. BMET No results for input(s): NA, K, CL, CO2, GLUCOSE, BUN, CREATININE, CALCIUM in the last 72 hours. PT/INR No results for input(s): LABPROT, INR in the last 72 hours. CMP     Component Value Date/Time   NA 135 12/08/2019 0341   K 3.6 12/08/2019 0341   CL 101 12/08/2019 0341   CO2 29 12/08/2019 0341   GLUCOSE 110 (H) 12/08/2019 0341   BUN 11 12/08/2019 0341   CREATININE 0.58 12/08/2019 0341   CALCIUM 8.4 (L) 12/08/2019 0341   PROT 5.8 (L) 12/08/2019 0341   ALBUMIN 2.2 (L) 12/08/2019 0341   AST 14 (L) 12/08/2019 0341   ALT 13 12/08/2019 0341   ALKPHOS 69 12/08/2019 0341   BILITOT 0.3 12/08/2019 0341   GFRNONAA >60 12/08/2019 0341   GFRAA >60 12/08/2019 0341   Lipase  No results found for: LIPASE     Studies/Results: No results  found.  Anti-infectives: Anti-infectives (From admission, onward)   Start     Dose/Rate Route Frequency Ordered Stop   12/02/19 0900  piperacillin-tazobactam (ZOSYN) IVPB 3.375 g        3.375 g 12.5 mL/hr over 240 Minutes Intravenous Every 8 hours 12/02/19 0824     11/25/19 1700  fluconazole (DIFLUCAN) IVPB 400 mg        400 mg 100 mL/hr over 120 Minutes Intravenous Every 24 hours 11/25/19 1649 11/29/19 2356   11/24/19 2000  vancomycin (VANCOCIN) IVPB 1000 mg/200 mL premix  Status:  Discontinued        1,000 mg 200 mL/hr over 60 Minutes Intravenous Every 8 hours 11/24/19 1111 11/25/19 1649   11/24/19 1400  piperacillin-tazobactam (ZOSYN) IVPB 3.375 g        3.375 g 12.5 mL/hr over 240 Minutes Intravenous Every 8 hours 11/24/19 1355 11/29/19 2030   11/24/19 1015  ceFEPIme (MAXIPIME) 2 g in sodium chloride 0.9 % 100 mL IVPB        2 g 200 mL/hr over 30 Minutes Intravenous  Once 11/24/19 1003 11/24/19 1130   11/24/19 1015  metroNIDAZOLE (FLAGYL) IVPB 500 mg        500 mg 100 mL/hr over 60 Minutes Intravenous  Once 11/24/19 1003 11/24/19 1400   11/24/19 0945  vancomycin (VANCOCIN) IVPB 1000 mg/200 mL premix  Status:  Discontinued        1,000 mg 200  mL/hr over 60 Minutes Intravenous  Once 11/24/19 0931 11/24/19 0939   11/24/19 0945  vancomycin (VANCOREADY) IVPB 1500 mg/300 mL        1,500 mg 150 mL/hr over 120 Minutes Intravenous  Once 11/24/19 2355 11/24/19 1418       Assessment/Plan HTN - home med ABLAnemia - Hgb6.9 (9/6).Patient states that due to religious beliefs she would still not want any blood products and understands the risks.Pediatric tube blood draws only to limit loss. We will hold off on daily blood draws to limit loss.Cont IV Ferumoxytol, and Aranesp Code status - DNR/DNI. Note from 8/26 "We discussed that given patients dropping hgb, she is at increased risk for decompensation that could lead to a cardiac event or death. We discussed code status in detail.  Patient wishes to be DNR/DNI. We filled out a MOST form that was signed by the patient and placed in her paper chart as well as scanned into the media section. Her code status was changed on electronic records as well." Malnutrition - prealbumin20.4 9/6 and PO intake improving, d/c TPN L popliteal vein and left posterior tibial veins-Seen by Dr. Myra Gianotti of vascular.S/p IVC filter placement by IR on 9/1 Possible Pyelonephritis on CT- UAand Ucx negative  Perforated diverticulitis with w/feculent peritonitisand abdominal wall abscess - s/p open sigmoid colectomy and end colostomy (Hartmann's procedure), small bowel resection, incision and drainage of abdominal wall abscess with debridement of skin and subcutaneous fatty tissueby Dr. Donell Beers 8/24 - POD #16 -CT8/30withPelvic fluid measuring 6.5 x 3.2 x 2.8 cm.On IV abx. IRunable to place drain 9/1. Appearedto be decompressing through wound vac, leukocytosis improved - repeat CT 9/6 with persistent pelvic collection 7.2 x 2.6 x 2.6 cm - pt clinically improving still and risk of bleeding with procedure, keep on abx and monitor for now -Patient reports increased PO intake - TPN stopped 9/7 - WOCN following for new ostomyand wound vac.Change MWF - [facility]. PT/OT.Recommending HH - Pulm toilet, IS - Path without evidence of malignancy. As noted below  ID -[email or web address].vancomycin8/23.Zosyn 8/23- 8/28.Diflucan 8/24-8/28. Zosyn 8/31 >> VTE -SCDs,hold lovenox2/2 anemia FEN -Soft+ensure Foley -Dc'd 8/26 Follow up -Dr. Donell Beers  Dispo: repeat labs this AM. Transition to PO abx. Likely home tomorrow.   Path A. COLON, SIGMOID, RESECTION:  - Segment of colon (25 cm) showing diverticulosis, diverticulitis with  associated abscesses, serositis and serosal adhesions  - Serosal adhesions to portion of benign ovary  - Endometriosis  - Margins appear viable   B. SMALL BOWEL, SEGMENT OF ILEUM, RESECTION:  - Segment  of small intestine (11.5 cm) with serositis and serosal  adhesions  - Margins appear viable   LOS: 17 days    Juliet Rude , [facility] 12/11/2019, 7:46 AM Please see Amion for pager number during day hours 7:00am-4:30pm

## 2019-12-12 MED ORDER — METHOCARBAMOL 500 MG PO TABS: 500.0000 mg | ORAL_TABLET | Freq: Four times a day (QID) | ORAL | 0 refills | Status: DC | PRN

## 2019-12-12 MED ORDER — AMOXICILLIN-POT CLAVULANATE 875-125 MG PO TABS
1.0000 | ORAL_TABLET | Freq: Two times a day (BID) | ORAL | 0 refills | Status: AC
Start: 2019-12-12 — End: 2019-12-26

## 2019-12-12 MED ORDER — ACETAMINOPHEN 500 MG PO TABS: 1000.0000 mg | ORAL_TABLET | Freq: Three times a day (TID) | ORAL | Status: DC | PRN

## 2019-12-12 MED ORDER — OXYCODONE HCL 5 MG PO TABS: 5.0000 mg | ORAL_TABLET | Freq: Four times a day (QID) | ORAL | 0 refills | Status: DC | PRN

## 2019-12-12 MED ORDER — ONDANSETRON 4 MG PO TBDP
4.0000 mg | ORAL_TABLET | Freq: Four times a day (QID) | ORAL | 0 refills | Status: DC | PRN
Start: 2019-12-12 — End: 2020-08-24

## 2019-12-12 MED FILL — AMOX-CLAV 875-125 MG TABLET: 875-125 | 14 days supply | Qty: 28 | Fill #0

## 2019-12-12 MED FILL — ONDANSETRON ODT 4 MG TABLET: 4 | 5 days supply | Qty: 20 | Fill #0

## 2019-12-12 MED FILL — METHOCARBAMOL 500 MG TABS: 500 | 15 days supply | Qty: 60 | Fill #0

## 2019-12-12 MED FILL — oxyCODONE HCL 5 MG TABS: 5 | 5 days supply | Qty: 30 | Fill #0

## 2019-12-12 NOTE — Consult Note (Signed)
WOC Nurse wound follow up Patient receiving care in [facility] 6N24. PA, Trixie Deis, at bedside for wound assessment. Wound type: healing abdominal surgical wound Measurement:  tunnel at 12 o'clock measures 2.4 cm. Wound bed: 100% beefy red granulation tissue Drainage (amount, consistency, odor) serous in cannister Periwound: intact Dressing procedure/placement/frequency: 3 pieces of black foam removed; 3 pieces of black foam placed; immediate seal obtained.   WOC Nurse ostomy follow up Stoma type/location: LUQ colostomy Stomal assessment/size: 1 1/4 inches Peristomal assessment: intact Treatment options for stomal/peristomal skin: barrier ring Output: soft brown stool  Ostomy pouching: 1pc. Education provided: Patient performed entire pouch removal, application process with very little assistance from me Pt has been enrolled in the [employer] Discharge program. Verified per TC that patient has been enrolled and it has been sent out, however according to patient it has not been received. Will verify correct address and resend SS for Secure [employer] order with delivery signature required.   Renaldo Reel Katrinka Blazing, MSN, RN, CMSRN, AGCNS, [facility] Wound Treatment Associate Wound, Ostomy, Continence Nurse Office [phone] Pager [phone]

## 2019-12-12 NOTE — Progress Notes (Signed)
MEDICATION RELATED CONSULT NOTE  Pharmacy Consult for Aranesp Indication: anemia  Allergies  Allergen Reactions  . Aspirin     diarheea , stomach ache.     Patient Measurements: Height: 5' 6.5" (168.9 cm) Weight: 90 kg (198 lb 6.6 oz) IBW/kg (Calculated) : 60.44  Labs: Recent Labs    12/11/19 1347  WBC 7.2  HGB 8.0*  HCT 24.1*  PLT 289  CREATININE 0.70   Estimated Creatinine Clearance: 90.6 mL/min (by C-G formula based on SCr of 0.7 mg/dL).  Assessment: 54 YOF to continue on Aranesp for anemia.  No blood transfusions for religious reasons.  Iron studies deferred since patient received Feraheme 510mg  IV x 2 doses.  Hemoglobin improved to 8; no bleeding reported.  Goal of Therapy:  Improving hemoglobin Bleeding prevention  Plan:  Continue Aranesp SQ qFri for up to 4 dose (today is dose #3) Monitor effect and could consider increasing Aranesp dose  [name] D. 05-10-1983, PharmD, BCPS, BCCCP 12/12/2019, 12:45 PM

## 2019-12-12 NOTE — Progress Notes (Signed)
Physical Therapy Treatment Patient Details Name: [name] MRN: [id number] DOB: [birth year] Today's Date: 12/12/2019    History of Present Illness 54yo female PMH HTN and recurrent diverticulitis who returned to the ED today with 2 days of worsening abdominal pain.  Patient was admitted 8/1 through 8/9 with complicated diverticulitis and multiple abscesses. CT scan shows significantly increased size of large abscess within the soft tissues of the lower anterior abdominal wall now measuring up to approximately 21 cm, previously 8 cm. Pt is now s/p Open sigmoid colectomy and end colostomy (Hartmann's procedure), small bowel resection, incision and drainage of abdominal wall abscess with debridement of skin and subcutaneous fatty tissue on 8/24. Found to have LLE popliteal and posterior tibial vein DVT, age indeterminate. S/p IVC filter 12/03/2019.    PT Comments    Patient progressing well towards PT goals. Reports feeling ready and eager to d/c home. Feels good with gait and stair training so declined those today but concerned about bed mobility. Tolerated bed mobility with HOB mostly flat (slightly elevated to simulate 6 pillows behind her), rails down and bed height elevated to simulate home. Demonstrated good log roll technique with this setup. Education re: importance of mobility while at home and changing positions frequently as well as exercise for strengthening. Pt agreeable. Answered all questions asked. Will follow if still in the hospital.   Follow Up Recommendations  Home health PT;Supervision for mobility/OOB     Equipment Recommendations  Rolling walker with 5" wheels    Recommendations for Other Services       Precautions / Restrictions Precautions Precautions: None Precaution Comments: colostomy, wound vac Restrictions Weight Bearing Restrictions: No    Mobility  Bed Mobility Overal bed mobility: Needs Assistance Bed Mobility: Rolling;Sidelying to Sit;Sit to  Supine Rolling: Modified independent (Device/Increase time) Sidelying to sit: Modified independent (Device/Increase time)   Sit to supine: Modified independent (Device/Increase time)   General bed mobility comments: HOB ~25 degrees to simulate 6 pillows at home and rails removed; Cues for log roll technique. Bed height increased as well to simulate home.  Transfers Overall transfer level: Modified independent Equipment used: None Transfers: Sit to/from Stand Sit to Stand: Modified independent (Device/Increase time)         General transfer comment: Stood from EOB x1, without difficulty.  Ambulation/Gait             General Gait Details: Deferred gait and stairs as pt feels comfortable with both and discharging home.   Stairs             Wheelchair Mobility    Modified Rankin (Stroke Patients Only)       Balance Overall balance assessment: Needs assistance Sitting-balance support: Feet supported;No upper extremity supported Sitting balance-Leahy Scale: Good     Standing balance support: During functional activity;No upper extremity supported Standing balance-Leahy Scale: Good                              Cognition Arousal/Alertness: Awake/alert Behavior During Therapy: WFL for tasks assessed/performed Overall Cognitive Status: Within Functional Limits for tasks assessed                                        Exercises      General Comments General comments (skin integrity, edema, etc.): Discussed concerns for home; pt's only concern was bed  mobility and getting in/out of bed without rails and bed being so high. Practiced with simulated environment.      Pertinent Vitals/Pain Pain Assessment: Faces Faces Pain Scale: Hurts a little bit Pain Location: abdomen Pain Descriptors / Indicators: Discomfort Pain Intervention(s): Monitored during session    Home Living                      Prior Function             PT Goals (current goals can now be found in the care plan section) Progress towards PT goals: Progressing toward goals    Frequency    Min 3X/week      PT Plan Current plan remains appropriate    Co-evaluation              AM-PAC PT "6 Clicks" Mobility   Outcome Measure  Help needed turning from your back to your side while in a flat bed without using bedrails?: None Help needed moving from lying on your back to sitting on the side of a flat bed without using bedrails?: None Help needed moving to and from a bed to a chair (including a wheelchair)?: None Help needed standing up from a chair using your arms (e.g., wheelchair or bedside chair)?: None Help needed to walk in hospital room?: None Help needed climbing 3-5 steps with a railing? : None 6 Click Score: 24    End of Session   Activity Tolerance: Patient tolerated treatment well Patient left: in bed;with call bell/phone within reach Nurse Communication: Mobility status PT Visit Diagnosis: Other abnormalities of gait and mobility (R26.89);Muscle weakness (generalized) (M62.81)     Time: 9458-5929 PT Time Calculation (min) (ACUTE ONLY): 18 min  Charges:  $Therapeutic Activity: 8-22 mins                     Vale Haven, PT, DPT Acute Rehabilitation Services Pager [phone] Office [phone]       Blake Divine A Lanier Ensign 12/12/2019, 11:20 AM

## 2019-12-12 NOTE — Progress Notes (Signed)
Pt reports feeling "hot" then "cold" and "clammy". PA notified.

## 2019-12-12 NOTE — Progress Notes (Signed)
[name] to be D/C'd per MD order. Discussed with the patient and all questions fully answered.  VSS, Skin clean, dry, and intact without evidence of skin break down, no evidence of skin tears noted.  An After Visit Summary was printed and given to the patient. Patient informed where to pick up prescriptions.  D/c education completed with patient including follow up instructions, medication list, d/c activities limitations if indicated, with other d/c instructions as indicated by MD - patient able to verbalize understanding, all questions fully answered.  Patient instructed to return to ED, call 911, or call MD for any changes in condition.  Patient to be escorted via WC, and D/C home via private auto.

## 2019-12-13 ENCOUNTER — Encounter (HOSPITAL_COMMUNITY): Payer: Self-pay | Admitting: Emergency Medicine

## 2019-12-13 ENCOUNTER — Emergency Department (HOSPITAL_COMMUNITY)
Admission: EM | Admit: 2019-12-13 | Discharge: 2019-12-13 | Disposition: A | Discharge status: Home / Self Care | Payer: Commercial Managed Care - PPO | Attending: Emergency Medicine | Admitting: Emergency Medicine

## 2019-12-13 ENCOUNTER — Other Ambulatory Visit: Payer: Self-pay

## 2019-12-13 DIAGNOSIS — Z933 Colostomy status: Secondary | ICD-10-CM | POA: Insufficient documentation

## 2019-12-13 DIAGNOSIS — I10 Essential (primary) hypertension: Secondary | ICD-10-CM | POA: Diagnosis not present

## 2019-12-13 DIAGNOSIS — R194 Change in bowel habit: Secondary | ICD-10-CM | POA: Insufficient documentation

## 2019-12-13 DIAGNOSIS — Z79899 Other long term (current) drug therapy: Secondary | ICD-10-CM | POA: Insufficient documentation

## 2019-12-13 DIAGNOSIS — Z433 Encounter for attention to colostomy: Secondary | ICD-10-CM

## 2019-12-13 NOTE — ED Notes (Signed)
Patient verbalizes understanding of discharge instructions. Opportunity for questioning and answers were provided. Armband removed by staff, pt discharged from ED. Pt. ambulatory and discharged home.  

## 2019-12-13 NOTE — Discharge Instructions (Addendum)
If you feel like the stool is having a hard time coming out through the ostomy, you can use Colace, twice a day, or MiraLAX once a day to help with symptoms of decreased stooling.  Call the surgery department as needed for a checkup or with problems.

## 2019-12-13 NOTE — ED Triage Notes (Signed)
Pt. Stated I was just d/c'ed yesterday and my colostomy bag was doing fine, now its not. Its not draining, making any noise, nothing.

## 2019-12-13 NOTE — ED Provider Notes (Signed)
[facility] EMERGENCY DEPARTMENT Provider Note   CSN: [id number] Arrival date & time: 12/13/19  0754     History Chief Complaint  Patient presents with  . Post-op Problem  . GI Problem    [name] is a 54 y.o. female.  HPI She presents for evaluation of change in consistency of the stool burden extrude from her colostomy.  That started, this morning.  She denies fever, chills, cough.  There is no change in her urinary habits or difficulty urinating.  She was discharged from the hospital yesterday and after prolonged hospitalization including surgical intervention, prior treatment and she was discharged with a pulse.  She states that the drain output is less large abdominal.  There are no other known modifying    Past Medical History:  Diagnosis Date  . Diverticulitis   . Hypertension   . Refusal of blood product     Patient Active Problem List   Diagnosis Date Noted  . Protein-calorie malnutrition, severe 11/27/2019  . Diverticulitis of large intestine with abscess 11/14/2019  . Essential hypertension 11/14/2019  . Anemia, chronic disease 11/14/2019  . Severe sepsis (HCC) 11/06/2019  . Diverticulitis 11/02/2019  . Intra-abdominal abscess (HCC) 11/02/2019    Past Surgical History:  Procedure Laterality Date  . ABDOMINAL HYSTERECTOMY    . BOWEL RESECTION N/A 11/25/2019   Procedure: SMALL BOWEL RESECTION;  Surgeon: Almond Lint, MD;  Location: [facility] OR;  Service: General;  Laterality: N/A;  . COLECTOMY N/A 11/25/2019   Procedure: SIGMOID COLECTOMY;  Surgeon: Almond Lint, MD;  Location: [facility] OR;  Service: General;  Laterality: N/A;  . COLOSTOMY N/A 11/25/2019   Procedure: CREATION OF COLOSTOMY;  Surgeon: Almond Lint, MD;  Location: [facility] OR;  Service: General;  Laterality: N/A;  . INCISION AND DRAINAGE ABSCESS N/A 11/25/2019   Procedure: INCISION AND DRAINAGE ABDOMINAL ABSCESS;  Surgeon: Almond Lint, MD;  Location: [facility] OR;  Service: General;   Laterality: N/A;  . IR IVC FILTER PLMT / S&I Lenise Arena GUID/MOD SED  12/03/2019  . LAPAROTOMY N/A 11/25/2019   Procedure: EXPLORATORY LAPAROTOMY;  Surgeon: Almond Lint, MD;  Location: [facility] OR;  Service: General;  Laterality: N/A;     OB History   No obstetric history on file.     Family History  Problem Relation Age of Onset  . Cancer Neg Hx     Social History   Tobacco Use  . Smoking status: Never Smoker  . Smokeless tobacco: Never Used  Substance Use Topics  . Alcohol use: No  . Drug use: No    Home Medications Prior to Admission medications   Medication Sig Start Date End Date Taking? Authorizing Provider  acetaminophen (TYLENOL) 500 MG tablet Take 2 tablets (1,000 mg total) by mouth every 8 (eight) hours as needed for mild pain, fever or headache. 12/12/19   Juliet Rude, PA-C  ALPRAZolam Prudy Feeler) 0.5 MG tablet Take 0.5 mg by mouth 3 (three) times daily as needed for anxiety.    [provider]  amoxicillin-clavulanate (AUGMENTIN) 875-125 MG tablet Take 1 tablet by mouth every 12 (twelve) hours for 14 days. 12/12/19 12/26/19  Juliet Rude, PA-C  ferrous sulfate 325 (65 FE) MG tablet Take 1 tablet (325 mg total) by mouth 2 (two) times daily with a meal. 11/10/19 12/10/19  Hughie Closs, MD  hyoscyamine (LEVSIN) 0.125 MG tablet Take 0.125 mg by mouth every 6 (six) hours as needed for bladder spasms or cramping.  10/21/19   [provider]  losartan-hydrochlorothiazide (HYZAAR) 50-12.5 MG per tablet Take 1 tablet by mouth daily.    [provider]  methocarbamol (ROBAXIN) 500 MG tablet Take 1 tablet (500 mg total) by mouth every 6 (six) hours as needed for muscle spasms. 12/12/19   Juliet Rude, PA-C  ondansetron (ZOFRAN-ODT) 4 MG disintegrating tablet Take 1 tablet (4 mg total) by mouth every 6 (six) hours as needed for nausea. 12/12/19   Juliet Rude, PA-C  oxyCODONE (OXY IR/ROXICODONE) 5 MG immediate release tablet Take 1-2 tablets (5-10 mg total) by  mouth every 6 (six) hours as needed for moderate pain or severe pain. 12/12/19   Juliet Rude, PA-C  polyethylene glycol (MIRALAX / GLYCOLAX) 17 g packet Take 17 g by mouth daily as needed for mild constipation. 11/17/19   Sheikh, Kateri Mc Latif, DO  saccharomyces boulardii (FLORASTOR) 250 MG capsule Take 1 capsule (250 mg total) by mouth 2 (two) times daily. 11/10/19   Meuth, Lina Sar, PA-C    Allergies    Aspirin  Review of Systems   Review of Systems  Physical Exam Updated Vital Signs BP 112/75   Pulse (!) 102   Temp 99.2 F (37.3 C) (Oral)   Resp 18   SpO2 100%   Physical Exam  ED Results / Procedures / Treatments   Labs (all labs ordered are listed, but only abnormal results are displayed) Labs Reviewed - No data to display  EKG None  Radiology No results found.  Procedures Procedures (including critical care time)  Medications Ordered in ED Medications - No data to display  ED Course  I have reviewed the triage vital signs and the nursing notes.  Pertinent labs & imaging results that were available during my care of the patient were reviewed by me and considered in my medical decision making (see chart for details).  Clinical Course as of Dec 12 1245  Sat Dec 13, 2019  1215 I discussed the findings with Dr. Corliss Skains, general surgeon on call.   [EW]    Clinical Course User Index [EW] Mancel Bale, MD   MDM Rules/Calculators/A&P                           Patient Vitals for the past 24 hrs:  BP Temp Temp src Pulse Resp SpO2  12/13/19 1215 -- -- -- (!) 102 -- 100 %  12/13/19 1205 -- -- -- (!) 104 -- 100 %  12/13/19 1200 112/75 -- -- (!) 109 18 100 %  12/13/19 1155 -- -- -- (!) 109 -- 100 %  12/13/19 1145 -- -- -- (!) 117 -- 100 %  12/13/19 1135 -- -- -- (!) 114 -- 100 %  12/13/19 1125 -- -- -- (!) 112 -- 100 %  12/13/19 1115 115/80 -- -- (!) 133 -- 100 %  12/13/19 1038 112/79 99.2 F (37.3 C) Oral (!) 127 16 99 %  12/13/19 0804 120/75 98.9 F (37.2  C) -- (!) 126 17 100 %    12:47 PM Reevaluation with update and discussion. After initial assessment and treatment, an updated evaluation reveals she is comfortable and has tolerated some fluids, and crackers. Mancel Bale   Medical Decision Making:  This patient is presenting for evaluation of change in consistency of stool through her ostomy, which does not require a range of treatment options, and is not a complaint that involves a high risk of morbidity and mortality. The differential diagnoses include constipation, obstruction,  surgery complication. I decided to review old records, and in summary patient had a colostomy placed recently following a bout of diverticulitis with perforation and subsequent intra-abdominal and abdominal wall abscesses..  I do not require additional historical information from anyone.    Critical Interventions-clinical evaluation, oral nutrition trial, observation and reassessment  After These Interventions, the Patient was reevaluated and was found without complications, stable for discharge.  She appears to be having changes of stool consistency related to improvement rather than an acute abnormality.  No indication for further evaluation in the ED or hospitalization.  CRITICAL CARE-no Performed by: Mancel Bale  Nursing Notes Reviewed/ Care Coordinated Applicable Imaging Reviewed Interpretation of Laboratory Data incorporated into ED treatment  The patient appears reasonably screened and/or stabilized for discharge and I doubt any other medical condition or other [facility] requiring further screening, evaluation, or treatment in the ED at this time prior to discharge.  Plan: Home Medications-continue usual, use stool softener if needed; Home Treatments-gradual advance diet; return here if the recommended treatment, does not improve the symptoms; Recommended follow up-general surgery follow-up as needed for further care or complications of  ostomy     Final Clinical Impression(s) / ED Diagnoses Final diagnoses:  Colostomy care [facility])    Rx / DC Orders ED Discharge Orders    None       Mancel Bale, MD 12/13/19 1247

## 2019-12-15 ENCOUNTER — Other Ambulatory Visit: Payer: Self-pay

## 2019-12-17 ENCOUNTER — Other Ambulatory Visit: Payer: Self-pay | Admitting: *Deleted

## 2019-12-17 NOTE — Patient Outreach (Signed)
[facility]) Care Management  12/17/2019  [name] [birth year] [id number]   Referral received from hospital liaison as member has had 3 hospital admission in the home on August, discharged last on 9/10.  Call placed to member, identity verified.  This care manager introduced self and stated purpose of call.  [name] care management services explained.    After explanation of benefits, member report she feel she is now managing well on her own and denies the need of any further assistance.  Report she has [place] coming in for nursing and ostomy teaching, but state she is comfortable with her ostomy care.  This care manager was able to review discharge medications as well as follow up appointments with member.  She will see PCP on 9/16 and surgeon on 9/21.  Advised of need for CT of abdomen/pelvis prior to surgeon appointment.  Provided with contact information, state she will call to schedule.  She again denies any needs at this time.  This care manager will follow up with member within the next 2 weeks to assess for additional needs.  If she continue to deny needs, will close case at that time.  Kemper Durie, [place], MSN [facility] Care Management  [facility] Manager [phone]

## 2019-12-23 ENCOUNTER — Other Ambulatory Visit: Payer: Commercial Managed Care - PPO

## 2019-12-23 ENCOUNTER — Other Ambulatory Visit (HOSPITAL_COMMUNITY): Payer: Self-pay | Admitting: Physician Assistant

## 2019-12-23 DIAGNOSIS — Z933 Colostomy status: Secondary | ICD-10-CM

## 2019-12-29 ENCOUNTER — Other Ambulatory Visit: Payer: Self-pay | Admitting: *Deleted

## 2019-12-29 NOTE — Patient Outreach (Signed)
[facility]) Care Management  12/29/2019  Alise Calais 1965-11-26 [id number]   Call placed to member to follow up on MD appointments and further assessment of needs.  State she did not attend appointments last week, but they have been rescheduled for this week.  Denies needing transportation assistance.  Report she has continued become more comfortable with her ostomy care.  She does voice concern regarding not getting her supplies in a timely manner.  State she has spoken to [place] with [facility] and was told it would be taken care of.  She denies any needs from [facility] at this time with the exception of following up on ostomy supplies.    Call was placed to [facility], was able to speak to [facility].  She report an order was placed, member should have the supplies by Wednesday.  However, order is needed for MD since member has [facility] and a new supply company will be used.  Call was placed to [facility], request order be faxed to [facility].  This care manager will follow up with member within the next 3-4 business days to confirm supplies have been received.  Will attempt engagement again at that time, if member continue to deny needs will close case.  Kemper Durie, [place], MSN [facility] Care Management  [facility] Manager [phone]

## 2019-12-30 ENCOUNTER — Ambulatory Visit (HOSPITAL_COMMUNITY)
Admission: RE | Admit: 2019-12-30 | Discharge: 2019-12-30 | Disposition: A | Discharge status: Home / Self Care | Payer: Commercial Managed Care - PPO | Source: Ambulatory Visit | Attending: Physician Assistant | Admitting: Physician Assistant

## 2019-12-30 ENCOUNTER — Other Ambulatory Visit: Payer: Self-pay

## 2019-12-30 DIAGNOSIS — Z933 Colostomy status: Secondary | ICD-10-CM

## 2019-12-30 MED ORDER — IOHEXOL 9 MG/ML PO SOLN
ORAL | Status: AC
  Filled 2019-12-30: qty 1000

## 2019-12-30 MED ORDER — IOHEXOL 300 MG/ML  SOLN
100.0000 mL | Freq: Once | INTRAMUSCULAR | Status: AC | PRN
  Administered 2019-12-30: 100 mL via INTRAVENOUS

## 2019-12-30 MED ORDER — IOHEXOL 9 MG/ML PO SOLN: 500.0000 mL | ORAL | Status: AC

## 2020-01-02 ENCOUNTER — Telehealth: Payer: Self-pay

## 2020-01-02 ENCOUNTER — Other Ambulatory Visit: Payer: Self-pay

## 2020-01-02 ENCOUNTER — Other Ambulatory Visit: Payer: Self-pay | Admitting: *Deleted

## 2020-01-02 DIAGNOSIS — I824Y9 Acute embolism and thrombosis of unspecified deep veins of unspecified proximal lower extremity: Secondary | ICD-10-CM

## 2020-01-02 NOTE — Patient Outreach (Signed)
[facility]) Care Management  01/02/2020  [name] [birth year] [id number]   Call placed to member to follow up on ostomy supplies and MD appointments.  Report they went well and she has received supplies.  Expresses gratitude regarding assistance with resources, denies any other needs/concerns.  Encouraged to reach back out to this care manager if needs change in the future.  Will close case at this time.  Kemper Durie, [place], MSN [facility] Care Management  [facility] Manager [phone]

## 2020-01-02 NOTE — Telephone Encounter (Signed)
Opened in error

## 2020-01-02 NOTE — Telephone Encounter (Signed)
Patient left VM c/o LLE swelling and pain. She had a removable IVC filter placed by IR. She is a [employer] and not an optimal candidate for anticoagulation. Patient arrived in office to speak to triage about 20 minutes after placing call. I saw the patient in my office - LLE was slightly larger than the right. No warmth noted. Discussed with Dr. Randie Heinz, placed patient on the schedule for a Monday f/u with [place] and a LLE duplex. Patient was instructed to take ibuprofen or apap for pain, wear her compression stockings and elevate the extremity.Walking and light activities are okay. Patient verbalized understanding.

## 2020-01-05 ENCOUNTER — Ambulatory Visit (HOSPITAL_COMMUNITY)
Admission: RE | Admit: 2020-01-05 | Discharge: 2020-01-05 | Disposition: A | Discharge status: Home / Self Care | Payer: Commercial Managed Care - PPO | Source: Ambulatory Visit | Attending: Surgery | Admitting: Surgery

## 2020-01-05 ENCOUNTER — Encounter: Payer: Self-pay | Admitting: Surgery

## 2020-01-05 ENCOUNTER — Ambulatory Visit (INDEPENDENT_AMBULATORY_CARE_PROVIDER_SITE_OTHER): Payer: Commercial Managed Care - PPO | Admitting: Surgery

## 2020-01-05 ENCOUNTER — Other Ambulatory Visit: Payer: Self-pay

## 2020-01-05 VITALS — BP 116/83 | HR 105 | Temp 97.5°F | Resp 16 | Ht 66.5 in | Wt 182.0 lb

## 2020-01-05 DIAGNOSIS — I824Y9 Acute embolism and thrombosis of unspecified deep veins of unspecified proximal lower extremity: Secondary | ICD-10-CM

## 2020-01-05 DIAGNOSIS — M7989 Other specified soft tissue disorders: Secondary | ICD-10-CM

## 2020-01-05 MED ORDER — RIVAROXABAN (XARELTO) VTE STARTER PACK (15 & 20 MG): ORAL_TABLET | ORAL | 0 refills | Status: DC

## 2020-01-05 NOTE — Progress Notes (Signed)
Vascular and Vein Specialist of [facility]  Patient name: [name] MRN: [id number] DOB: 09-Aug-1965 Sex: female   REASON FOR VISIT:    Follow up  HISOTRY OF PRESENT ILLNESS:    [name] is a 54 y.o. female who I saw as a consult in the hospital on 12/02/2019 for a DVT.  The patient was admitted on 11/25/2019 with perforated diverticulitis.  She underwent an open sigmoid colectomy with end colostomy as well as small bowel resection.  She had persistent white blood cell count elevation so she underwent a CT scan that showed a undrained intra-abdominal abscess.  There was also a possible filling defect within her right iliac vein and so an ultrasound was performed.  This revealed a left popliteal and posterior tibial vein DVT.  Normally, anticoagulation would have been the treatment for this however the patient was anemic with a hemoglobin of 6.4, and she is a Jehov[employer]'s Witness.  Therefore anticoagulation was contraindicated.  A IVC filter was placed by interventional radiology at the time of attempted abscess drainage.  She will occasionally get some pain in her left leg behind the knee.  It is only marginally swollen.  She is wearing a copper fit compression garment.  The patient denies a prior history of DVT or PE.  She is medically managed for hypertension.  She is a non-smoker.  She is a Jehovah witness.  PAST MEDICAL HISTORY:   Past Medical History:  Diagnosis Date   Diverticulitis    Hypertension    Refusal of blood product      FAMILY HISTORY:   Family History  Problem Relation Age of Onset   Hypertension Mother    Diabetes Mother    Cancer Father     SOCIAL HISTORY:   Social History   Tobacco Use   Smoking status: Never Smoker   Smokeless tobacco: Never Used  Substance Use Topics   Alcohol use: No     ALLERGIES:   Allergies  Allergen Reactions   Aspirin     diarheea , stomach ache.      CURRENT  MEDICATIONS:   Current Outpatient Medications  Medication Sig Dispense Refill   acetaminophen (TYLENOL) 500 MG tablet Take 2 tablets (1,000 mg total) by mouth every 8 (eight) hours as needed for mild pain, fever or headache.     ALPRAZolam (XANAX) 0.5 MG tablet Take 0.5 mg by mouth 3 (three) times daily as needed for anxiety.     amoxicillin-clavulanate (AUGMENTIN) 875-125 MG tablet Take 1 tablet by mouth 2 (two) times daily.     hyoscyamine (LEVSIN) 0.125 MG tablet Take 0.125 mg by mouth every 6 (six) hours as needed for bladder spasms or cramping.      losartan-hydrochlorothiazide (HYZAAR) 50-12.5 MG per tablet Take 1 tablet by mouth daily.     methocarbamol (ROBAXIN) 500 MG tablet Take 1 tablet (500 mg total) by mouth every 6 (six) hours as needed for muscle spasms. 60 tablet 0   ondansetron (ZOFRAN-ODT) 4 MG disintegrating tablet Take 1 tablet (4 mg total) by mouth every 6 (six) hours as needed for nausea. 20 tablet 0   polyethylene glycol (MIRALAX / GLYCOLAX) 17 g packet Take 17 g by mouth daily as needed for mild constipation. 14 each 0   promethazine (PHENERGAN) 25 MG tablet Take 25 mg by mouth every 6 (six) hours as needed for nausea or vomiting.     saccharomyces boulardii (FLORASTOR) 250 MG capsule Take 1 capsule (250 mg total) by mouth  2 (two) times daily.     traMADol (ULTRAM) 50 MG tablet Take 50 mg by mouth every 6 (six) hours as needed.     ferrous sulfate 325 (65 FE) MG tablet Take 1 tablet (325 mg total) by mouth 2 (two) times daily with a meal. 60 tablet 0   oxyCODONE (OXY IR/ROXICODONE) 5 MG immediate release tablet Take 1-2 tablets (5-10 mg total) by mouth every 6 (six) hours as needed for moderate pain or severe pain. (Patient not taking: Reported on 01/05/2020) 30 tablet 0   No current facility-administered medications for this visit.    REVIEW OF SYSTEMS:   [X]  denotes positive finding, [ ]  denotes negative finding Cardiac  Comments:  Chest pain or chest  pressure:    Shortness of breath upon exertion:    Short of breath when lying flat:    Irregular heart rhythm:        Vascular    Pain in calf, thigh, or hip brought on by ambulation:    Pain in feet at night that wakes you up from your sleep:     Blood clot in your veins:    Leg swelling:  x       Pulmonary    Oxygen at home:    Productive cough:     Wheezing:         Neurologic    Sudden weakness in arms or legs:     Sudden numbness in arms or legs:     Sudden onset of difficulty speaking or slurred speech:    Temporary loss of vision in one eye:     Problems with dizziness:         Gastrointestinal    Blood in stool:     Vomited blood:         Genitourinary    Burning when urinating:     Blood in urine:        Psychiatric    Major depression:         Hematologic    Bleeding problems:    Problems with blood clotting too easily:        Skin    Rashes or ulcers:        Constitutional    Fever or chills:      PHYSICAL EXAM:   Vitals:   01/05/20 1400  BP: 116/83  Pulse: (!) 105  Resp: 16  Temp: (!) 97.5 F (36.4 C)  TempSrc: Temporal  SpO2: 98%  Weight: 182 lb (82.6 kg)  Height: 5' 6.5" (1.689 m)    GENERAL: The patient is a well-nourished female, in no acute distress. The vital signs are documented above. CARDIAC: There is a regular rate and rhythm.  VASCULAR: The left leg is only slightly larger than the right PULMONARY: Non-labored respirations MUSCULOSKELETAL: There are no major deformities or cyanosis. NEUROLOGIC: No focal weakness or paresthesias are detected. SKIN: There are no ulcers or rashes noted. PSYCHIATRIC: The patient has a normal affect.  STUDIES:   I have reviewed the following studies: Deep vein thrombosis in the mid to distal posterior tibial vein  extending into the popliteal vein and on proximally into the common  femoral vein. The Gastrocnemius vein are not compressible suggestive of  DVT as well.  MEDICAL ISSUES:   The  patient had what I believed to be a provoked DVT in the left leg following surgery for perforated diverticulitis.  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  A temporary IVC filter was placed by radiology.  She is recovering well from her surgery.  She will occasionally get pain in her left leg and it has only slightly larger than her right.  She tells me her hemoglobin is now up to 8.  There has been proximal propagation of her DVT which now extends into the common femoral vein.  It does not appear to go into the external iliac vein.  I discussed with the patient that I would like to put her on anticoagulation since her clot has propagated and because her hemoglobin is up to 8.  She will get a prescription for Xarelto which we will use for 2 months.  I am also getting her into a compression garment.  She will follow-up with me in 2 months.  At some point in time, I will need to get her filter out.    Charlena Cross, MD, FACS Vascular and Vein Specialists of [facility] [phone] Pager [phone]

## 2020-01-13 ENCOUNTER — Telehealth: Payer: Self-pay

## 2020-01-13 NOTE — Telephone Encounter (Signed)
Triage call from patient reporting her urine bright yellow even with drinking plenty of water during the day. She inquired if she should be concerned that  xarelto is the cause. Advised patient the need to report urine that is pink, reddish, bright red or any dark black stool. Patient voiced understanding.

## 2020-01-28 ENCOUNTER — Other Ambulatory Visit: Payer: Self-pay

## 2020-01-28 MED ORDER — RIVAROXABAN 20 MG PO TABS: 20.0000 mg | ORAL_TABLET | Freq: Every day | ORAL | 0 refills | Status: DC

## 2020-02-12 ENCOUNTER — Telehealth: Payer: Self-pay | Admitting: *Deleted

## 2020-02-12 NOTE — Telephone Encounter (Signed)
Returning [name]'s telephone voice message with questions about compression hose, position, and activity level.  Encouraged [name] to wear compression hose except when sleeping, elevate legs when sitting, and moderate walking for exercise.  Encouraged her to change her positioning when sitting with leg elevated while working remotely and to get up intermittently and walk to avoid prolonged sitting.

## 2020-02-19 ENCOUNTER — Other Ambulatory Visit: Payer: Self-pay | Admitting: Surgery

## 2020-02-19 NOTE — Telephone Encounter (Signed)
Take Xarelto for 2 months per MD.

## 2020-03-03 ENCOUNTER — Telehealth: Payer: Self-pay

## 2020-03-03 ENCOUNTER — Other Ambulatory Visit: Payer: Self-pay

## 2020-03-03 MED ORDER — RIVAROXABAN 20 MG PO TABS
20.0000 mg | ORAL_TABLET | Freq: Every day | ORAL | 0 refills | Status: DC
Start: 2020-03-03 — End: 2020-03-27

## 2020-03-03 NOTE — Telephone Encounter (Signed)
Patient called about Xarelto refills. She would not be out long prior to 12/6 appt with [facility], but her affected leg is still swollen Called in RX - patient also had question about taking stomach medicine with blood thinner - pharmacist verified it was ok - just to take 3 hours apart.

## 2020-03-08 ENCOUNTER — Ambulatory Visit: Payer: Commercial Managed Care - PPO | Admitting: Surgery

## 2020-03-22 ENCOUNTER — Ambulatory Visit (INDEPENDENT_AMBULATORY_CARE_PROVIDER_SITE_OTHER): Payer: Commercial Managed Care - PPO | Admitting: Surgery

## 2020-03-22 ENCOUNTER — Encounter: Payer: Self-pay | Admitting: Surgery

## 2020-03-22 ENCOUNTER — Other Ambulatory Visit: Payer: Self-pay

## 2020-03-22 VITALS — BP 127/91 | HR 100 | Temp 98.4°F | Resp 20 | Ht 66.5 in

## 2020-03-22 DIAGNOSIS — I824Y9 Acute embolism and thrombosis of unspecified deep veins of unspecified proximal lower extremity: Secondary | ICD-10-CM

## 2020-03-22 NOTE — Progress Notes (Signed)
Vascular and Vein Specialist of [facility]  Patient name: [name] MRN: [id number] DOB: 1965/12/07 Sex: female   REASON FOR VISIT:    Follow up  HISOTRY OF PRESENT ILLNESS:    [name] is a 54 y.o. female who I saw as a consult in the hospital on 12/02/2019 for a DVT.  The patient was admitted on 11/25/2019 with perforated diverticulitis.  She underwent an open sigmoid colectomy with end colostomy as well as small bowel resection.  She had persistent white blood cell count elevation so she underwent a CT scan that showed a undrained intra-abdominal abscess.  There was also a possible filling defect within her right iliac vein and so an ultrasound was performed.  This revealed a left popliteal and posterior tibial vein DVT.  Normally, anticoagulation would have been the treatment for this however the patient was anemic with a hemoglobin of 6.4, and she is a Jehov[employer]'s Witness.  Therefore anticoagulation was contraindicated.  A IVC filter was placed by interventional radiology at the time of attempted abscess drainage.  She will occasionally get some pain in her left leg behind the knee.  It is only marginally swollen.  She is wearing a copper fit compression garment.  The patient denies a prior history of DVT or PE.  She is medically managed for hypertension.  She is a non-smoker.  She is a Jehovah witness.   PAST MEDICAL HISTORY:   Past Medical History:  Diagnosis Date  . Diverticulitis   . DVT (deep venous thrombosis) (HCC)   . Hypertension   . Refusal of blood product      FAMILY HISTORY:   Family History  Problem Relation Age of Onset  . Hypertension Mother   . Diabetes Mother   . Cancer Father     SOCIAL HISTORY:   Social History   Tobacco Use  . Smoking status: Never Smoker  . Smokeless tobacco: Never Used  Substance Use Topics  . Alcohol use: No     ALLERGIES:   Allergies  Allergen Reactions  . Aspirin      diarheea , stomach ache.      CURRENT MEDICATIONS:   Current Outpatient Medications  Medication Sig Dispense Refill  . acetaminophen (TYLENOL) 500 MG tablet Take 2 tablets (1,000 mg total) by mouth every 8 (eight) hours as needed for mild pain, fever or headache.    . ALPRAZolam (XANAX) 0.5 MG tablet Take 0.5 mg by mouth 3 (three) times daily as needed for anxiety.    . hyoscyamine (LEVSIN) 0.125 MG tablet Take 0.125 mg by mouth every 6 (six) hours as needed for bladder spasms or cramping.     Marland Kitchen losartan-hydrochlorothiazide (HYZAAR) 50-12.5 MG per tablet Take 1 tablet by mouth daily.    . methocarbamol (ROBAXIN) 500 MG tablet Take 1 tablet (500 mg total) by mouth every 6 (six) hours as needed for muscle spasms. 60 tablet 0  . polyethylene glycol (MIRALAX / GLYCOLAX) 17 g packet Take 17 g by mouth daily as needed for mild constipation. 14 each 0  . rivaroxaban (XARELTO) 20 MG TABS tablet Take 1 tablet (20 mg total) by mouth daily with supper. 30 tablet 0  . saccharomyces boulardii (FLORASTOR) 250 MG capsule Take 1 capsule (250 mg total) by mouth 2 (two) times daily.    . ferrous sulfate 325 (65 FE) MG tablet Take 1 tablet (325 mg total) by mouth 2 (two) times daily with a meal. 60 tablet 0  . ondansetron (ZOFRAN-ODT) 4  MG disintegrating tablet Take 1 tablet (4 mg total) by mouth every 6 (six) hours as needed for nausea. (Patient not taking: Reported on 03/22/2020) 20 tablet 0  . traMADol (ULTRAM) 50 MG tablet Take 50 mg by mouth every 6 (six) hours as needed. (Patient not taking: Reported on 03/22/2020)     No current facility-administered medications for this visit.    REVIEW OF SYSTEMS:   [X]  denotes positive finding, [ ]  denotes negative finding Cardiac  Comments:  Chest pain or chest pressure:    Shortness of breath upon exertion:    Short of breath when lying flat:    Irregular heart rhythm:        Vascular    Pain in calf, thigh, or hip brought on by ambulation:    Pain in feet  at night that wakes you up from your sleep:     Blood clot in your veins:    Leg swelling:         Pulmonary    Oxygen at home:    Productive cough:     Wheezing:         Neurologic    Sudden weakness in arms or legs:     Sudden numbness in arms or legs:     Sudden onset of difficulty speaking or slurred speech:    Temporary loss of vision in one eye:     Problems with dizziness:         Gastrointestinal    Blood in stool:     Vomited blood:         Genitourinary    Burning when urinating:     Blood in urine:        Psychiatric    Major depression:         Hematologic    Bleeding problems:    Problems with blood clotting too easily:        Skin    Rashes or ulcers:        Constitutional    Fever or chills:      PHYSICAL EXAM:   Vitals:   03/22/20 1130  BP: (!) 127/91  Pulse: 100  Resp: 20  Temp: 98.4 F (36.9 C)  SpO2: 99%  Height: 5' 6.5" (1.689 m)    GENERAL: The patient is a well-nourished female, in no acute distress. The vital signs are documented above. CARDIAC: There is a regular rate and rhythm.  VASCULAR: Minimal left leg swelling PULMONARY: Non-labored respirations MUSCULOSKELETAL: There are no major deformities or cyanosis. NEUROLOGIC: No focal weakness or paresthesias are detected. SKIN: There are no ulcers or rashes noted. PSYCHIATRIC: The patient has a normal affect.  STUDIES:   None  MEDICAL ISSUES:   We discussed continuing to wear her compression socks and take anticoagulation for another month.  I will have her follow-up in 1 month I will repeat her lower extremity duplex.  As long as there is has been no interval change, we will schedule her for IVC filter removal.    03/24/20, IV, MD, FACS Vascular and Vein Specialists of [facility] [phone] Pager [phone]

## 2020-03-23 ENCOUNTER — Other Ambulatory Visit: Payer: Self-pay

## 2020-03-23 DIAGNOSIS — I824Y9 Acute embolism and thrombosis of unspecified deep veins of unspecified proximal lower extremity: Secondary | ICD-10-CM

## 2020-03-27 ENCOUNTER — Other Ambulatory Visit: Payer: Self-pay | Admitting: Surgery

## 2020-04-19 ENCOUNTER — Ambulatory Visit: Payer: Commercial Managed Care - PPO | Admitting: Surgery

## 2020-04-19 ENCOUNTER — Encounter (HOSPITAL_COMMUNITY): Payer: Commercial Managed Care - PPO

## 2020-04-20 ENCOUNTER — Other Ambulatory Visit: Payer: Self-pay | Admitting: Vascular Surgery

## 2020-05-03 ENCOUNTER — Encounter: Payer: Self-pay | Admitting: Surgery

## 2020-05-03 ENCOUNTER — Ambulatory Visit (HOSPITAL_COMMUNITY)
Admission: RE | Admit: 2020-05-03 | Discharge: 2020-05-03 | Disposition: A | Discharge status: Home / Self Care | Payer: Commercial Managed Care - PPO | Source: Ambulatory Visit | Attending: Surgery | Admitting: Surgery

## 2020-05-03 ENCOUNTER — Ambulatory Visit: Payer: Commercial Managed Care - PPO | Admitting: Surgery

## 2020-05-03 ENCOUNTER — Other Ambulatory Visit: Payer: Self-pay

## 2020-05-03 ENCOUNTER — Ambulatory Visit (INDEPENDENT_AMBULATORY_CARE_PROVIDER_SITE_OTHER)
Admission: RE | Admit: 2020-05-03 | Discharge: 2020-05-03 | Disposition: A | Discharge status: Home / Self Care | Payer: Commercial Managed Care - PPO | Source: Ambulatory Visit | Attending: Surgery | Admitting: Surgery

## 2020-05-03 VITALS — BP 128/78 | HR 105 | Temp 98.0°F | Resp 20 | Ht 66.5 in | Wt 225.0 lb

## 2020-05-03 DIAGNOSIS — M7989 Other specified soft tissue disorders: Secondary | ICD-10-CM

## 2020-05-03 DIAGNOSIS — I824Y9 Acute embolism and thrombosis of unspecified deep veins of unspecified proximal lower extremity: Secondary | ICD-10-CM

## 2020-05-03 NOTE — Progress Notes (Signed)
Vascular and Vein Specialist of [facility]  Patient name: [name] MRN: [id number] DOB: 22-Sep-1965 Sex: female   REASON FOR VISIT:    Follow up  HISOTRY OF PRESENT ILLNESS:    Gabrielle Blackwellis a 55 y.o.femalewho I saw as a consult in the hospital on 12/02/2019 for a DVT. The patient was admitted on 11/25/2019 with perforated diverticulitis. She underwent an open sigmoid colectomy with end colostomy as well as small bowel resection. She had persistent white blood cell count elevation so she underwent a CT scan that showed a undrained intra-abdominal abscess. There was also a possible filling defect within her right iliac vein and so an ultrasound was performed. This revealed a left popliteal and posterior tibial vein DVT. Normally, anticoagulation would have been the treatment for this however the patient was anemic with a hemoglobin of 6.4, and she is a Jehov[employer]'s Witness. Therefore anticoagulationwas contraindicated. A IVC filter was placed by interventional radiology at the time of attempted abscess drainage.  Her left leg is only marginally swollen. She is wearing a copper fit compression garment.  The patient denies a prior history of DVT or PE. She is medically managed for hypertension. She is a non-smoker. She is a Jehovah witness.   PAST MEDICAL HISTORY:   Past Medical History:  Diagnosis Date  . Diverticulitis   . DVT (deep venous thrombosis) (HCC)   . Hypertension   . Refusal of blood product      FAMILY HISTORY:   Family History  Problem Relation Age of Onset  . Hypertension Mother   . Diabetes Mother   . Cancer Father     SOCIAL HISTORY:   Social History   Tobacco Use  . Smoking status: Never Smoker  . Smokeless tobacco: Never Used  Substance Use Topics  . Alcohol use: No     ALLERGIES:   Allergies  Allergen Reactions  . Aspirin     diarheea , stomach ache.      CURRENT MEDICATIONS:    Current Outpatient Medications  Medication Sig Dispense Refill  . acetaminophen (TYLENOL) 500 MG tablet Take 2 tablets (1,000 mg total) by mouth every 8 (eight) hours as needed for mild pain, fever or headache.    . ALPRAZolam (XANAX) 0.5 MG tablet Take 0.5 mg by mouth 3 (three) times daily as needed for anxiety.    . hyoscyamine (LEVSIN) 0.125 MG tablet Take 0.125 mg by mouth every 6 (six) hours as needed for bladder spasms or cramping.     Marland Kitchen losartan-hydrochlorothiazide (HYZAAR) 50-12.5 MG per tablet Take 1 tablet by mouth daily.    . methocarbamol (ROBAXIN) 500 MG tablet Take 1 tablet (500 mg total) by mouth every 6 (six) hours as needed for muscle spasms. 60 tablet 0  . polyethylene glycol (MIRALAX / GLYCOLAX) 17 g packet Take 17 g by mouth daily as needed for mild constipation. 14 each 0  . saccharomyces boulardii (FLORASTOR) 250 MG capsule Take 1 capsule (250 mg total) by mouth 2 (two) times daily.    Carlena Hurl 20 MG TABS tablet TAKE 1 TABLET BY MOUTH DAILY WITH SUPPER 30 tablet 0  . ferrous sulfate 325 (65 FE) MG tablet Take 1 tablet (325 mg total) by mouth 2 (two) times daily with a meal. 60 tablet 0  . ondansetron (ZOFRAN-ODT) 4 MG disintegrating tablet Take 1 tablet (4 mg total) by mouth every 6 (six) hours as needed for nausea. (Patient not taking: No sig reported) 20 tablet 0  . traMADol (ULTRAM)  50 MG tablet Take 50 mg by mouth every 6 (six) hours as needed. (Patient not taking: No sig reported)     No current facility-administered medications for this visit.    REVIEW OF SYSTEMS:   [X]  denotes positive finding, [ ]  denotes negative finding Cardiac  Comments:  Chest pain or chest pressure:    Shortness of breath upon exertion:    Short of breath when lying flat:    Irregular heart rhythm:        Vascular    Pain in calf, thigh, or hip brought on by ambulation:    Pain in feet at night that wakes you up from your sleep:     Blood clot in your veins:    Leg swelling:  x        Pulmonary    Oxygen at home:    Productive cough:     Wheezing:         Neurologic    Sudden weakness in arms or legs:     Sudden numbness in arms or legs:     Sudden onset of difficulty speaking or slurred speech:    Temporary loss of vision in one eye:     Problems with dizziness:         Gastrointestinal    Blood in stool:     Vomited blood:         Genitourinary    Burning when urinating:     Blood in urine:        Psychiatric    Major depression:         Hematologic    Bleeding problems:    Problems with blood clotting too easily:        Skin    Rashes or ulcers:        Constitutional    Fever or chills:      PHYSICAL EXAM:   Vitals:   05/03/20 0901  BP: 128/78  Pulse: (!) 105  Resp: 20  Temp: 98 F (36.7 C)  SpO2: 97%  Weight: 225 lb (102.1 kg)  Height: 5' 6.5" (1.689 m)    GENERAL: The patient is a well-nourished female, in no acute distress. The vital signs are documented above. CARDIAC: There is a regular rate and rhythm.  VASCULAR: minimal left leg edema PULMONARY: Non-labored respirations MUSCULOSKELETAL: There are no major deformities or cyanosis. NEUROLOGIC: No focal weakness or paresthesias are detected. SKIN: There are no ulcers or rashes noted. PSYCHIATRIC: The patient has a normal affect.  STUDIES:   I reviewed the following: IVC/Iliac: No evidence of thrombus in IVC and left Iliac veins.   - Findings consistent with age indeterminate deep vein thrombosis  involving the left common femoral vein, left femoral vein, left popliteal  vein, left posterior tibial veins, and left gastrocnemius veins.  - Findings appear improved from previous examination.    - There is no evidence of superficial venous thrombosis.    - No cystic structure found in the popliteal fossa.  MEDICAL ISSUES:   Provoked left leg DVT: Ultrasound today shows no evidence of thrombus within her ileal caval system.  She continues to have femoral-popliteal  and tibial DVT.  These appear to be improved.  She has a IVC filter in place which I would like to remove.  She is seeing general surgery later this month to discuss colostomy reversal.  I told her that I would like to remove her filter approximately 3 to 4 weeks after her  colostomy takedown.  She will contact me when she gets this scheduled.    Charlena Cross, MD, FACS [facility] Vein Specialists of [facility] [phone] Pager [phone]

## 2020-05-18 ENCOUNTER — Ambulatory Visit: Payer: Self-pay | Admitting: Surgery

## 2020-05-18 ENCOUNTER — Encounter: Payer: Self-pay | Admitting: Surgery

## 2020-05-18 DIAGNOSIS — G8929 Other chronic pain: Secondary | ICD-10-CM | POA: Insufficient documentation

## 2020-05-18 DIAGNOSIS — F410 Panic disorder [episodic paroxysmal anxiety] without agoraphobia: Secondary | ICD-10-CM | POA: Insufficient documentation

## 2020-05-18 DIAGNOSIS — D6869 Other thrombophilia: Secondary | ICD-10-CM | POA: Insufficient documentation

## 2020-05-18 DIAGNOSIS — F418 Other specified anxiety disorders: Secondary | ICD-10-CM | POA: Insufficient documentation

## 2020-05-18 DIAGNOSIS — K5730 Diverticulosis of large intestine without perforation or abscess without bleeding: Secondary | ICD-10-CM | POA: Insufficient documentation

## 2020-05-18 DIAGNOSIS — E782 Mixed hyperlipidemia: Secondary | ICD-10-CM | POA: Insufficient documentation

## 2020-05-18 DIAGNOSIS — K219 Gastro-esophageal reflux disease without esophagitis: Secondary | ICD-10-CM | POA: Insufficient documentation

## 2020-05-18 DIAGNOSIS — K589 Irritable bowel syndrome without diarrhea: Secondary | ICD-10-CM | POA: Insufficient documentation

## 2020-05-18 DIAGNOSIS — Z6841 Body Mass Index (BMI) 40.0 and over, adult: Secondary | ICD-10-CM | POA: Insufficient documentation

## 2020-05-18 DIAGNOSIS — Z933 Colostomy status: Secondary | ICD-10-CM | POA: Insufficient documentation

## 2020-05-18 NOTE — H&P (Signed)
[name] Appointment: 05/18/2020 11:30 AM Location: [facility] Patient #: [id number] DOB: 03-Oct-1965 Single / Language: [place] / Race: Black or African American Female  History of Present Illness Ardeth Sportsman MD; 05/18/2020 12:30 PM) The patient is a 55 year old female who presents with diverticulitis. Note for "Diverticulitis": ` ` ` The patient returns s/p Gertie Gowda sigmoid colectomy/colostomy for perforated diverticulitis and abscesses 11/25/2019      The patient returns to clinic after surgery, gradually improving. Appetite is good. Energy level good. Her ostomy appliance can stay on for at least 3 or 4 days. Usually changes every couple days. Has a bowel movement once a day. Feels some swelling around the ostomy wonders if she has a parastomal hernia. Gets embarrassed by the flatus and gas. Walking more. Energy level better. Denies nausea, constipation/diarrhea, worsening fatigue, high fevers, or other concerns.  She was found to have an iliofemoral venous thrombosis. She was rather anemic perioperatively and is a Jehovah's Witness so IVC filter placed at first. Now anticoagulated on Xerelto. It has been 6 months. Normally they would remove the filter and Xerelto that were holding off until decision made for ostomy takedown. Patient denies any cardiac or pulmonary issues. Blood pressure control. Does not smoke. She is not diabetic. She did have some urinary discomfort. Urinalysis eating urine culture was negative according to primary care physician last week.  She densely wishes for a colostomy takedown. There is discussion about having colonoscopy preoperatively his she's never had one. She is in [facility]. I think there are issues with communications if she is has not seen a gastroenterologist about that yet  ` ###########################################  Pathology: SURGICAL PATHOLOGY CASE: [id number] PATIENT: [name] Surgical Pathology Report     Clinical History: diverticulitis, intraabdominal abscess (cm)     FINAL MICROSCOPIC DIAGNOSIS:  A. COLON, SIGMOID, RESECTION: - Segment of colon (25 cm) showing diverticulosis, diverticulitis with associated abscesses, serositis and serosal adhesions - Serosal adhesions to portion of benign ovary - Endometriosis - Margins appear viable  B. SMALL BOWEL, SEGMENT OF ILEUM, RESECTION: - Segment of small intestine (11.5 cm) with serositis and serosal adhesions - Margins appear viable     Deajah Erkkila DESCRIPTION:  A: The specimen is received fresh labeled sigmoid colon, and consists of a portion of colon with 2 stapled resection margins measuring 25.0 cm in length. The serosal surface is tan-red, hyperemic, with a 13.0 cm in length area of red-brown, roughened, indurated serosa and surrounding adipose tissue. Multiple full-thickness perforations are noted within the area of interest. The lumen is filled with a moderate amount of brown-green fecal material. Mucosa is tan-pink with normal folding, multiple diverticuli are noted throughout the length of the specimen. No mucosal lesions are grossly identified. Wall ranges from 0.3 to 1.3 cm in thickness. Indurated tissue noted on the serosal surface displays a tan-white, whorled, fibrotic cut surface. No enlarged lymph nodes are grossly identified. Representative sections are submitted in 7 cassettes. 1 and 2 = opposing margins 3 and 4 = representative sections of perforations 5 and 6 = indurated, fibrotic tissue 7 = diverticulum  B: The specimen is received fresh labeled segment of ileum, and consists of a segment of bowel with 2 stapled resection margins measuring 11.5 cm in length. The serosal surface is tan-pink, hyperemic, with a 2.0 cm in length area of red, roughened, indurated serosa noted and a portion of possibly adherent gray-purple, indurated adipose tissue. Lumen  is filled with a moderate amount of  tan-green partially digested material. Mucosa is tan-pink with normal folding. Wall ranges from 0.2 to 1.0 cm in thickness. Representative sections are submitted in 5 cassettes. 1 and 2 = opposing margins 3-5 = representative sections to include roughened serosa and thickened wall Lovey Newcomer 11/26/2019)    Final Diagnosis performed by Holley Bouche, MD. Electronically signed 11/26/2019 Technical component performed at [facility]. [facility], [address], [place], [place] [address]. Professional component performed at [facility], [address], [place], [place] [address]. Immunohistochemistry Technical component (if applicable) was performed at [facility]. [address], [address], [place], [place] [address]. IMMUNOHISTOCHEMISTRY DISCLAIMER (if applicable): Some of these immunohistochemical stains may have been developed and the performance characteristics determine by [facility]. Some may not have been cleared or approved by the U.S. Food and Drug Administration. The FDA has determined that such clearance or approval is not necessary. This test is used for clinical purposes. It should not be regarded as investigational or for research. This laboratory is certified under the Clinical Laboratory Improvement Amendments of 1988 (CLIA-88) as qualified to perform high complexity clinical laboratory testing. The controls stained appropriately. ` ###########################################`  PREOPERATIVE DIAGNOSIS: Perforated diverticulitis.  POSTOPERATIVE DIAGNOSIS: Perforated diverticulitis.  PROCEDURE PERFORMED: Open sigmoid colectomy and end colostomy (Hartmann's procedure), small bowel resection, incision and drainage of abdominal wall abscess with debridement of skin and subcutaneous fatty tissue  SURGEON: Almond Lint, MD  ASSISTANT: Carlena Bjornstad, PA-C  ANESTHESIA:  General.  FINDINGS: feculent peritonitis with purulent drainage present upon entry unto abdominal cavity generalized to lower abdomen and pelvis. Numerous diverticuli along sigmoid colon.  ESTIMATED BLOOD LOSS: 500 mL. Additional 950 mL of stool from abdominal wall abscess  COMPLICATIONS: None known.  PROCEDURE: Patient was identified in the holding area and taken to the operating room where she was placed supine on the operating room table. General anesthesia was induced. Foley catheter was placed. The abdomen was prepped and draped in a sterile fashion. Time-out was performed according to the surgical safety check list. When all was correct we continued.  Two drains were in the anterior abdominal wall. There was stool draining around the drains. A small incision was made in the skin and the pus/stool was evacuated. The skin was then incised in the midline and the remaining stool/pus in the anterior abdominal wall was cleaned up with pulse lavage. Some of the skin was necrotic as it was so tense. The fascia was then opened in the midline. The skin incision was carried out to above the umbilicus. The fascia was entered superiorly above the inflammatory changes. The fascia was then elevated with Kocher clamps while the colon and small bowel were taken off the lower anterior abdominal wall. The peritoneum was inflamed throughout the lower half of the abdomen with pus and stool. Sharp dissection was used to take the bowel down. The Balfour self retaining retractor was placed. Once the superior rectum/distal sigmoid was located, the contour stapler was used to divide the bowel.   The proximal sigmoid colon was still quite inflamed. This was taken down laterally sharply. The white line of Toldt of the descending colon was taken down with the Bovie electrocautery proximally along the descending colon. The Ligasure was used to divide the mesocolon high up near the colon wall starting at  the distal stapled end. Once adequate length of descending colon had been mobilized, the proximal sigmoid was then divided with the GIA. The abdomen was then irrigated copiously. There  had been a loop of small bowel that was very close to the drain site and the perforation. This had a thinned out area of serosa. Given the magnitude of inflammation present, it was felt that this would not hold sutures to oversew the serosal defect. This small segment of ileum, around 3 inches, was resected with the GIA 75 mm stapler, and a side-to-side, functional end-to-end anastamosis was created.   Attention was directed to the left abdomen for a location for an ostomy. Kochers were used to pull the fascia to the midline and an another Zannie Cove was used to elevate the skin to create a circular site. This was placed superior to the abscess cavity. A divot of fatty tissue was taken as well. The fascia was opened in a cruciate fashion separating the longitudinal fibers of the rectus. A lap was placed behind the fascia to ensure that the Bovie could not go through and puncture any of the intra-abdominal organs. A Tanja Port was then advanced through the abdominal wall and used to retract the stump of the descending colon through the abdominal wall.  The abdomen was then closed using running #1 looped PDS sutures. The abscess cavity was irrigated with the pulse lavage. The necrotic skin was debrided sharply with the cautery. The wound was then irrigated and packed with moist betadine soaked Kerlix.  The colon was then opened with the Bovie. 3-0 Vicryl interrupted sutures were used to create the ostomy. This was then dressed with a 2 piece wafer.  The patient was allowed to emerge from anesthesia and taken to the PACU in stable condition. Needle, sponge, and instrument counts were correct x 2.      Allergies Marliss Coots, CNA; 05/18/2020 11:29 AM) Aspir-Low *ANALGESICS - NonNarcotic* Allergies  Reconciled  Medication History Marliss Coots, CNA; 05/18/2020 11:29 AM) Sherren Mocha Supplies (1 (one) 2-3 times a week, Taken starting 12/29/2019) Active. (Flange and bags for colostomy, colostomy supplies) Methocarbamol (500MG  Tablet, 1 (one) Oral every eight hours, as needed, Taken starting 03/01/2020) Active. Hyoscyamine Sulfate (0.125MG  Tablet, Oral) Active. ALPRAZolam (0.5MG  Tablet, Oral) Active. Xarelto Starter Pack (15 & 20MG  Tab Ther Pack, Oral) Active. Ondansetron (4MG  Tablet Disint, Oral) Active. Losartan Potassium-HCTZ (50-12.5MG  Tablet, Oral) Active. Medications Reconciled     Physical Exam 03/03/2020 MD; 05/18/2020 11:50 AM)  General Mental Status-Alert. General Appearance-Not in acute distress, Not Sickly. Orientation-Oriented X3. Hydration-Well hydrated. Voice-Normal.  Integumentary Global Assessment Upon inspection and palpation of skin surfaces of the - Axillae: non-tender, no inflammation or ulceration, no drainage. and Distribution of scalp and body hair is normal. General Characteristics Temperature - normal warmth is noted.  Head and Neck Head-normocephalic, atraumatic with no lesions or palpable masses. Face Global Assessment - atraumatic, no absence of expression. Neck Global Assessment - no abnormal movements, no bruit auscultated on the right, no bruit auscultated on the left, no decreased range of motion, non-tender. Trachea-midline. Thyroid Gland Characteristics - non-tender.  Eye Eyeball - Left-Extraocular movements intact, No Nystagmus - Left. Eyeball - Right-Extraocular movements intact, No Nystagmus - Right. Cornea - Left-No Hazy - Left. Cornea - Right-No Hazy - Right. Sclera/Conjunctiva - Left-No scleral icterus, No Discharge - Left. Sclera/Conjunctiva - Right-No scleral icterus, No Discharge - Right. Pupil - Left-Direct reaction to light normal. Pupil - Right-Direct reaction to light  normal.  ENMT Ears Pinna - Left - no drainage observed, no generalized tenderness observed. Pinna - Right - no drainage observed, no generalized tenderness observed. Nose and Sinuses External Inspection of the Nose -  no destructive lesion observed. Inspection of the nares - Left - quiet respiration. Inspection of the nares - Right - quiet respiration. Mouth and Throat Lips - Upper Lip - no fissures observed, no pallor noted. Lower Lip - no fissures observed, no pallor noted. Nasopharynx - no discharge present. Oral Cavity/Oropharynx - Tongue - no dryness observed. Oral Mucosa - no cyanosis observed. Hypopharynx - no evidence of airway distress observed.  Chest and Lung Exam Inspection Movements - Normal and Symmetrical. Accessory muscles - No use of accessory muscles in breathing. Palpation Palpation of the chest reveals - Non-tender. Auscultation Breath sounds - Normal and Clear.  Cardiovascular Auscultation Rhythm - Regular. Murmurs & Other Heart Sounds - Auscultation of the heart reveals - No Murmurs and No Systolic Clicks.  Abdomen Inspection Inspection of the abdomen reveals - No Visible peristalsis and No Abnormal pulsations. Umbilicus - No Bleeding, No Urine drainage. Palpation/Percussion Palpation and Percussion of the abdomen reveal - Soft, Non Tender, No Rebound tenderness, No Rigidity (guarding) and No Cutaneous hyperesthesia. Note: Abdomen soft. Not severely distended. No distasis recti. No umbilical or other anterior abdominal wall hernias  Female Genitourinary Sexual Maturity Tanner 5 - Adult hair pattern. Note: No vaginal bleeding nor discharge  Peripheral Vascular Upper Extremity Inspection - Left - No Cyanotic nailbeds - Left, Not Ischemic. Inspection - Right - No Cyanotic nailbeds - Right, Not Ischemic.  Neurologic Neurologic evaluation reveals -normal attention span and ability to concentrate, able to name objects and repeat phrases. Appropriate fund  of knowledge , normal sensation and normal coordination. Mental Status Affect - not angry, not paranoid. Cranial Nerves-Normal Bilaterally. Gait-Normal.  Neuropsychiatric Mental status exam performed with findings of-able to articulate well with normal speech/language, rate, volume and coherence, thought content normal with ability to perform basic computations and apply abstract reasoning and no evidence of hallucinations, delusions, obsessions or homicidal/suicidal ideation.  Musculoskeletal Global Assessment Spine, Ribs and Pelvis - no instability, subluxation or laxity. Right Upper Extremity - no instability, subluxation or laxity.  Lymphatic Head & Neck  General Head & Neck Lymphatics: Bilateral - Description - No Localized lymphadenopathy. Axillary  General Axillary Region: Bilateral - Description - No Localized lymphadenopathy. Femoral & Inguinal  Generalized Femoral & Inguinal Lymphatics: Left - Description - No Localized lymphadenopathy. Right - Description - No Localized lymphadenopathy.    Assessment & Plan Ardeth Sportsman MD; 05/18/2020 12:32 PM)  STATUS POST HARTMANN PROCEDURE (Z93.3) Impression: Recovering status post open Hartmann resection for complicated perforated diverticulitis and numerous abscesses despise antibiotics and drainage in August.  Is appropriate for colostomy takedown at this time. Reasonable minimally invasive robotic approach.  Would like to try and get a colonoscopy done before surgery. See if we can coordinate the day before.  The patient just gets one bowel prep. She is interested in that. We will try and regenerative [facility] again. I gave her my card and asked her to try and reach out as well. Call us if she is having issues.  I believe her vascular surgeon once the hold off on IVC filter removal until she is more than a few weeks out from her colostomy takedown.  Patient has delayed colored vaccination with all her  health and other issues once to get that done before surgery. Due to get this vaccinated this week. She is hoping to do this in late March, when 2 weeks after her second shock. That is not unreasonable.  Current Plans The anatomy & physiology of the digestive tract  was discussed. The pathophysiology was discussed. Possibility of remaining with an ostomy permanently was discussed. I offered ostomy takedown. Laparoscopic & open techniques were discussed.  Risks such as bleeding, infection, abscess, leak, reoperation, possible re-ostomy, injury to other organs, hernia, heart attack, death, and other risks were discussed. I noted a good likelihood this will help address the problem. Goals of post-operative recovery were discussed as well. We will work to minimize complications. Questions were answered. The patient expresses understanding & wishes to proceed with surgery.   PREOP COLON - ENCOUNTER FOR PREOPERATIVE EXAMINATION FOR GENERAL SURGICAL PROCEDURE (Z01.818)  Current Plans You are being scheduled for surgery- Our schedulers will call you.  You should hear from our office's scheduling department within 5 working days about the location, date, and time of surgery. We try to make accommodations for patient's preferences in scheduling surgery, but sometimes the OR schedule or the surgeon's schedule prevents [place] from making those accommodations.  If you have not heard from our office [phone]) in 5 working days, call the office and ask for your surgeon's nurse.  If you have other questions about your diagnosis, plan, or surgery, call the office and ask for your surgeon's nurse.  Written instructions provided Pt Education - [facility] Colon Bowel Prep 2018 ERAS/Miralax/Antibiotics Started Neomycin Sulfate 500 MG Oral Tablet, 2 (two) Tablet SEE NOTE, #6, 05/18/2020, No Refill. Local Order: Pharmacist Notes: TAKE TWO TABLETS AT 2 PM, 3 PM, AND 10 PM THE DAY PRIOR TO SURGERY Pt Education  - Pamphlet Given - Laparoscopic Colorectal Surgery: discussed with patient and provided information. Pt Education - [facility] Colectomy post-op instructions: discussed with patient and provided information. Started metroNIDAZOLE 500 MG Oral Tablet, 2 (two) Tablet three times daily, #6, 1 day starting 05/18/2020, No Refill. Local Order: Pharmacist Notes: Pharmacy Instructions: Take 2 tablets at 2pm, 3pm, and 10pm the day prior to your colon operation.  DIVERTICULITIS (K57.92) Impression: Pt referred to GI for colonoscopy.  Will then refer to colorectal here for consideration of lap or robotic ostomy takedown. I advised that she will need to have the scope prior to takedown as well as be back to normal energy level.  Current Plans Instructed to schedule colonoscopywith a gastroenterologist Pt Education - [facility] Diverticular Disease (AT)  COLOSTOMY IN PLACE (Z93.3)  Current Plans Pt Education - [facility] Ostomy HCI (Aroura Vasudevan): discussed with patient and provided information.  LEFT LEG DVT (I82.402) Impression: filter/xarelto/compression stocking per Vascular surgery   PRESENCE OF IVC FILTER (G25.427) Impression: IVC filter for iliofemoral DVT in the setting of not able to be initially anticoagulated around the time of emergency surgery. Plan for filter removal once she recovers from her colostomy takedown.   ANTICOAGULATED (Z79.01) Impression: Anticoagulated Xerelto. Should be able to 2 days preop. She may be at the point where he can be stopped since been 6 months. I'll defer to her primary care physician and vascular surgeon.   PATIENT IS JEHOVAH'S WITNESS (Z78.9)  Ardeth Sportsman, MD, FACS, MASCRS Gastrointestinal and Minimally Invasive Surgery  [facility] [address] [place], [place] [address] [phone] Fax [phone] Main/Paging  CONTACT INFORMATION: Weekday (9AM-5PM) concerns: Call [facility] main office at [phone] Weeknight (5PM-9AM) or  Weekend/Holiday concerns: Check [email or web address] for General Surgery [facility] coverage (Please, do not use SecureChat as it is not reliable communication to operating surgeons for immediate patient care)

## 2020-05-19 ENCOUNTER — Ambulatory Visit: Payer: Commercial Managed Care - PPO | Admitting: Medical

## 2020-05-21 ENCOUNTER — Telehealth: Payer: Self-pay

## 2020-05-21 NOTE — Telephone Encounter (Signed)
Received VM from patient asking if it was safe to proceed with Covid vaccine today due to presence of blood clot.Confirmed with Dr. Myra Gianotti it is safe to proceed with vaccine. Let VM with number on file to inform patient.

## 2020-06-04 ENCOUNTER — Other Ambulatory Visit: Payer: Self-pay

## 2020-06-04 ENCOUNTER — Telehealth: Payer: Self-pay

## 2020-06-04 MED ORDER — RIVAROXABAN 20 MG PO TABS: 20.0000 mg | ORAL_TABLET | Freq: Every day | ORAL | 3 refills | Status: DC

## 2020-06-04 NOTE — Telephone Encounter (Signed)
Patient called to ask for xarelto refill. Per Dr. Myra Gianotti, she is to take xarelto until IVC filter is removed. Sent in RX and left VM to inform patient.

## 2020-07-21 ENCOUNTER — Other Ambulatory Visit: Payer: Self-pay | Admitting: Urology

## 2020-07-22 ENCOUNTER — Other Ambulatory Visit: Payer: Self-pay | Admitting: Surgery

## 2020-08-25 NOTE — Progress Notes (Addendum)
COVID Vaccine Completed: Date COVID Vaccine completed: Has received booster: COVID vaccine manufacturer: Pfizer    [employer]   Date of COVID positive in last 90 days:  PCP - [name], FNP Cardiologist -   Chest x-ray -  EKG - 11/24/19 Epic Stress Test -  ECHO -  Cardiac Cath -  Pacemaker/ICD device last checked: Spinal Cord Stimulator:  Sleep Study -  CPAP -   Fasting Blood Sugar -  Checks Blood Sugar _____ times a day  Blood Thinner Instructions: ? Xerolto  Aspirin Instructions: Last Dose:  Activity level:  Can go up a flight of stairs and perform activities of daily living without stopping and without symptoms of chest pain or shortness of breath.   Able to exercise without symptoms  Unable to go up a flight of stairs without symptoms of      Anesthesia review:  Hx of acquired thrombophilia, hx of DVT.  Has IVC filter  Patient denies shortness of breath, fever, cough and chest pain at PAT appointment   Patient verbalized understanding of instructions that were given to them at the PAT appointment. Patient was also instructed that they will need to review over the PAT instructions again at home before surgery.

## 2020-08-25 NOTE — Patient Instructions (Addendum)
DUE TO COVID-19 ONLY ONE VISITOR IS ALLOWED TO COME WITH YOU AND STAY IN THE WAITING ROOM ONLY DURING PRE OP AND PROCEDURE.   **NO VISITORS ARE ALLOWED IN THE SHORT STAY AREA OR RECOVERY ROOM!!**  IF YOU WILL BE ADMITTED INTO THE HOSPITAL YOU ARE ALLOWED ONLY TWO SUPPORT PEOPLE DURING VISITATION HOURS ONLY (10AM -8PM)   . The support person(s) may change daily. . The support person(s) must pass our screening, gel in and out, and wear a mask at all times, including in the patient's room. . Patients must also wear a mask when staff or their support person are in the room.  No visitors under the age of 98. Any visitor under the age of 25 must be accompanied by an adult.    COVID SWAB TESTING MUST BE COMPLETED ON:  09-06-20 @ 2:55 PM   [address] [place], [place] [address]   . You are not required to quarantine, however you are required to wear a well-fitted mask when you are out and around people not in your household.  Gilford Rile Hygiene often . Do NOT share personal items . Notify your provider if you are in close contact with someone who has COVID or you develop fever 100.4 or greater, new onset of sneezing, cough, sore throat, shortness of breath or body aches.   Your procedure is scheduled on: 09/08/20   Report to [facility] Main  Entrance    Report to admitting at 10:00 AM   Call this number if you have problems the morning of surgery [phone]   Follow a clear liquid diet day of prep to avoid dehydration.   Do not eat food :After Midnight.   May have liquids until  9:00 AM  day of surgery  CLEAR LIQUID DIET  Foods Allowed                                                                     Foods Excluded  Water, Black Coffee and tea, regular and decaf            liquids that you cannot  Plain Jell-O in any flavor  (No red)                                   see through such as: Fruit ices (not with fruit pulp)                                           milk, soups,  orange juice              Iced Popsicles (No red)                                               All solid food  Apple juices Sports drinks like Gatorade (No red) Lightly seasoned clear broth or consume(fat free) Sugar, honey syrup  Sample Menu Breakfast                                Lunch                                     Supper Cranberry juice                    Beef broth                            Chicken broth Jell-O                                     Grape juice                           Apple juice Coffee or tea                        Jell-O                                      Popsicle                                                Coffee or tea                        Coffee or tea        Drink 2 G2 drinks the night before surgery, have done by 10PM.    Complete one G2 drink the morning of surgery 3 hours prior to scheduled surgery at 9AM.     1. The day of surgery:  ? Drink ONE (1) Pre-Surgery Clear Ensure or G2 by am the morning of surgery. Drink in one sitting. Do not sip.  ? This drink was given to you during your hospital  pre-op appointment visit. ? Nothing else to drink after completing the  Pre-Surgery Clear Ensure or G2.          If you have questions, please contact your surgeon's office.     Oral Hygiene is also important to reduce your risk of infection.                                    Remember - BRUSH YOUR TEETH THE MORNING OF SURGERY WITH YOUR REGULAR TOOTHPASTE   Do NOT smoke after Midnight   Take these medicines the morning of surgery with A SIP OF WATER:  Alprazolam, Promethazine                             You may not have any metal on your body including hair pins, jewelry, and body piercings  Do not wear make-up, lotions, powders, perfumes/cologne, or deodorant             Do not wear nail polish.  Do not shave  48 hours prior to surgery.    Do not bring valuables to the hospital. [facility] IS  NOT             RESPONSIBLE   FOR VALUABLES.   Contacts, dentures or bridgework may not be worn into surgery.   Bring small overnight bag day of surgery.   Please read over the following fact sheets you were given: IF YOU HAVE QUESTIONS ABOUT YOUR PRE OP INSTRUCTIONS PLEASE CALL [phone]   [facility] - Preparing for Surgery Before surgery, you can play an important role.  Because skin is not sterile, your skin needs to be as free of germs as possible.  You can reduce the number of germs on your skin by washing with CHG (chlorahexidine gluconate) soap before surgery.  CHG is an antiseptic cleaner which kills germs and bonds with the skin to continue killing germs even after washing. Please DO NOT use if you have an allergy to CHG or antibacterial soaps.  If your skin becomes reddened/irritated stop using the CHG and inform your nurse when you arrive at Short Stay. Do not shave (including legs and underarms) for at least 48 hours prior to the first CHG shower.  You may shave your face/neck.  Please follow these instructions carefully:  1.  Shower with CHG Soap the night before surgery and the  morning of surgery.  2.  If you choose to wash your hair, wash your hair first as usual with your normal  shampoo.  3.  After you shampoo, rinse your hair and body thoroughly to remove the shampoo.                             4.  Use CHG as you would any other liquid soap.  You can apply chg directly to the skin and wash.  Gently with a scrungie or clean washcloth.  5.  Apply the CHG Soap to your body ONLY FROM THE NECK DOWN.   Do   not use on face/ open                           Wound or open sores. Avoid contact with eyes, ears mouth and   genitals (private parts).                       Wash face,  Genitals (private parts) with your normal soap.             6.  Wash thoroughly, paying special attention to the area where your    surgery  will be performed.  7.  Thoroughly rinse your body with warm  water from the neck down.  8.  DO NOT shower/wash with your normal soap after using and rinsing off the CHG Soap.                9.  Pat yourself dry with a clean towel.            10.  Wear clean pajamas.            11.  Place clean sheets on your bed the night of your first shower and do not  sleep with pets. Day  of Surgery : Do not apply any lotions/deodorants the morning of surgery.  Please wear clean clothes to the hospital/surgery center.  FAILURE TO FOLLOW THESE INSTRUCTIONS MAY RESULT IN THE CANCELLATION OF YOUR SURGERY  PATIENT SIGNATURE_________________________________  NURSE SIGNATURE__________________________________  ________________________________________________________________________   Rogelia Mire  An incentive spirometer is a tool that can help keep your lungs clear and active. This tool measures how well you are filling your lungs with each breath. Taking long deep breaths may help reverse or decrease the chance of developing breathing (pulmonary) problems (especially infection) following:  A long period of time when you are unable to move or be active. BEFORE THE PROCEDURE   If the spirometer includes an indicator to show your best effort, your nurse or respiratory therapist will set it to a desired goal.  If possible, sit up straight or lean slightly forward. Try not to slouch.  Hold the incentive spirometer in an upright position. INSTRUCTIONS FOR USE  1. Sit on the edge of your bed if possible, or sit up as far as you can in bed or on a chair. 2. Hold the incentive spirometer in an upright position. 3. Breathe out normally. 4. Place the mouthpiece in your mouth and seal your lips tightly around it. 5. Breathe in slowly and as deeply as possible, raising the piston or the ball toward the top of the column. 6. Hold your breath for 3-5 seconds or for as long as possible. Allow the piston or ball to fall to the bottom of the column. 7. Remove the  mouthpiece from your mouth and breathe out normally. 8. Rest for a few seconds and repeat Steps 1 through 7 at least 10 times every 1-2 hours when you are awake. Take your time and take a few normal breaths between deep breaths. 9. The spirometer may include an indicator to show your best effort. Use the indicator as a goal to work toward during each repetition. 10. After each set of 10 deep breaths, practice coughing to be sure your lungs are clear. If you have an incision (the cut made at the time of surgery), support your incision when coughing by placing a pillow or rolled up towels firmly against it. Once you are able to get out of bed, walk around indoors and cough well. You may stop using the incentive spirometer when instructed by your caregiver.  RISKS AND COMPLICATIONS  Take your time so you do not get dizzy or light-headed.  If you are in pain, you may need to take or ask for pain medication before doing incentive spirometry. It is harder to take a deep breath if you are having pain. AFTER USE  Rest and breathe slowly and easily.  It can be helpful to keep track of a log of your progress. Your caregiver can provide you with a simple table to help with this. If you are using the spirometer at home, follow these instructions: SEEK MEDICAL CARE IF:   You are having difficultly using the spirometer.  You have trouble using the spirometer as often as instructed.  Your pain medication is not giving enough relief while using the spirometer.  You develop fever of 100.5 F (38.1 C) or higher. SEEK IMMEDIATE MEDICAL CARE IF:   You cough up bloody sputum that had not been present before.  You develop fever of 102 F (38.9 C) or greater.  You develop worsening pain at or near the incision site. MAKE SURE YOU:   Understand these instructions.  Will watch your condition.  Will get help right away if you are not doing well or get worse. Document Released: 07/31/2006 Document  Revised: 06/12/2011 Document Reviewed: 10/01/2006 [facility] Patient Information 2014 [place], [place].   ________________________________________________________________________

## 2020-08-27 ENCOUNTER — Encounter (HOSPITAL_COMMUNITY): Payer: Self-pay

## 2020-08-27 ENCOUNTER — Other Ambulatory Visit: Payer: Self-pay

## 2020-08-27 ENCOUNTER — Other Ambulatory Visit: Payer: Self-pay | Admitting: Urology

## 2020-08-27 ENCOUNTER — Encounter (HOSPITAL_COMMUNITY)
Admission: RE | Admit: 2020-08-27 | Discharge: 2020-08-27 | Disposition: A | Discharge status: Home / Self Care | Payer: Commercial Managed Care - PPO | Source: Ambulatory Visit | Attending: Surgery | Admitting: Surgery

## 2020-08-27 HISTORY — DX: Unspecified osteoarthritis, unspecified site: M19.90

## 2020-08-27 HISTORY — DX: Prediabetes: R73.03

## 2020-08-27 HISTORY — DX: Anxiety disorder, unspecified: F41.9

## 2020-08-27 NOTE — Progress Notes (Signed)
COVID Vaccine Completed: No Date COVID Vaccine completed: Has received booster: COVID vaccine manufacturer: Pf[employer]    [employer]   Date of COVID positive in last 90 days:  N/A  PCP - [name], FNP Cardiologist - N/A  Chest x-ray - N/A EKG - 11/24/19 Epic Stress Test - N/A ECHO - N/A Cardiac Cath - N/A Pacemaker/ICD device last checked: Spinal Cord Stimulator:  Sleep Study - N/A CPAP -   Fasting Blood Sugar - Prediabetes, does not check CBG Checks Blood Sugar _____ times a day  Blood Thinner Instructions: N/A  Aspirin Instructions: Last Dose:  Activity level:   Can go up a flight of stairs and perform activities of daily living without stopping and without symptoms of chest pain or shortness of breath.  Able to exercise without symptoms   Anesthesia review:  Hx of acquired thrombophilia, hx of DVT.  Has IVC filter (will remove after surgery)  Patient denies shortness of breath, fever, cough and chest pain at PAT appointment (history completed over the phone)   Patient verbalized understanding of instructions that were given to them at the PAT appointment. Patient was also instructed that they will need to review over the PAT instructions again at home before surgery.

## 2020-09-01 ENCOUNTER — Ambulatory Visit: Payer: Self-pay | Admitting: Surgery

## 2020-09-01 NOTE — Patient Instructions (Signed)
DUE TO COVID-19 ONLY ONE VISITOR IS ALLOWED TO COME WITH YOU AND STAY IN THE WAITING ROOM ONLY DURING PRE OP AND PROCEDURE.   **NO VISITORS ARE ALLOWED IN THE SHORT STAY AREA OR RECOVERY ROOM!!**  IF YOU WILL BE ADMITTED INTO THE HOSPITAL YOU ARE ALLOWED ONLY TWO SUPPORT PEOPLE DURING VISITATION HOURS ONLY (10AM -8PM)   . The support person(s) may change daily. . The support person(s) must pass our screening, gel in and out, and wear a mask at all times, including in the patient's room. . Patients must also wear a mask when staff or their support person are in the room.  No visitors under the age of 27. Any visitor under the age of 4 must be accompanied by an adult.    COVID SWAB TESTING MUST BE COMPLETED ON:  09-06-20 @ 2:55 PM   [address] [place], [place] [address]   . You are not required to quarantine, however you are required to wear a well-fitted mask when you are out and around people not in your household.  Gilford Rile Hygiene often . Do NOT share personal items . Notify your provider if you are in close contact with someone who has COVID or you develop fever 100.4 or greater, new onset of sneezing, cough, sore throat, shortness of breath or body aches.   Your procedure is scheduled on:  Wednesday, 09-08-20   Report to [facility] Main  Entrance    Report to admitting at 10:00 AM   Call this number if you have problems the morning of surgery [phone]   Clear liquid diet day of prep to avoid dehydration   Do not eat food :After Midnight.   May have liquids until 9:00 AM day of surgery  CLEAR LIQUID DIET  Foods Allowed                                                                     Foods Excluded  Water, Black Coffee and tea, regular and decaf              liquids that you cannot  Plain Jell-O in any flavor  (No red)                                     see through such as: Fruit ices (not with fruit pulp)                                      milk, soups,  orange juice              Iced Popsicles (No red)                                      All solid food                                   Apple juices Sports drinks like Gatorade (  No red) Lightly seasoned clear broth or consume(fat free) Sugar, honey syrup  Sample Menu Breakfast                                Lunch                                     Supper Cranberry juice                    Beef broth                            Chicken broth Jell-O                                     Grape juice                           Apple juice Coffee or tea                        Jell-O                                      Popsicle                                                Coffee or tea                        Coffee or tea       Drink 2 G2 drinks the night before surgery, complete by 10 PM.     Complete one G2 drink the morning of surgery 3 hours prior to scheduled surgery, complete by 9:00 AM     1. The day of surgery:  ? Drink ONE (1) Pre-Surgery Clear Ensure or G2 by am the morning of surgery. Drink in one sitting. Do not sip.  ? This drink was given to you during your hospital  pre-op appointment visit. ? Nothing else to drink after completing the  Pre-Surgery Clear Ensure or G2.          If you have questions, please contact your surgeon's office.     Oral Hygiene is also important to reduce your risk of infection.                                    Remember - BRUSH YOUR TEETH THE MORNING OF SURGERY WITH YOUR REGULAR TOOTHPASTE   Do NOT smoke after Midnight   Take these medicines the morning of surgery with A SIP OF WATER:  Alprazolam, Promathazine  DO NOT TAKE ANY ORAL DIABETIC MEDICATIONS DAY OF YOUR SURGERY                              You may not have any metal on your  body including hair pins, jewelry, and body piercing            Do not wear make-up, lotions, powders, perfumes/cologne, or deodorant    Do not wear nail polish including gel and S&S, artificial/acrylic  nails, or any other type of covering on natural nails including finger and  `toenails. If you have artificial nails, gel coating, etc. that needs to be removed by a nail salon please have this removed prior to surgery  or surgery may need to be canceled/ delayed if the surgeon/ anesthesia feels like they are unable to be safely monitored.   Do not shave  48 hours prior to surgery.            Do not bring valuables to the hospital. [facility] IS NOT             RESPONSIBLE   FOR VALUABLES.   Contacts, dentures or bridgework may not be worn into surgery.   Bring small overnight bag day of surgery.    Patients discharged the day of surgery will not be allowed to drive home.               Please read over the following fact sheets you were given: IF YOU HAVE QUESTIONS ABOUT YOUR PRE OP INSTRUCTIONS PLEASE CALL  [phone] [facility] - Preparing for Surgery Before surgery, you can play an important role.  Because skin is not sterile, your skin needs to be as free of germs as possible.  You can reduce the number of germs on your skin by washing with CHG (chlorahexidine gluconate) soap before surgery.  CHG is an antiseptic cleaner which kills germs and bonds with the skin to continue killing germs even after washing. Please DO NOT use if you have an allergy to CHG or antibacterial soaps.  If your skin becomes reddened/irritated stop using the CHG and inform your nurse when you arrive at Short Stay. Do not shave (including legs and underarms) for at least 48 hours prior to the first CHG shower.  You may shave your face/neck.  Please follow these instructions carefully:  1.  Shower with CHG Soap the night before surgery and the  morning of surgery.  2.  If you choose to wash your hair, wash your hair first as usual with your normal  shampoo.  3.  After you shampoo, rinse your hair and body thoroughly to remove the shampoo.                             4.  Use CHG as you would any other  liquid soap.  You can apply chg directly to the skin and wash.  Gently with a scrungie or clean washcloth.  5.  Apply the CHG Soap to your body ONLY FROM THE NECK DOWN.   Do   not use on face/ open                           Wound or open sores. Avoid contact with eyes, ears mouth and   genitals (private parts).                       Wash face,  Genitals (private parts) with your normal soap.             6.  Wash thoroughly, paying special attention to the area  where your    surgery  will be performed.  7.  Thoroughly rinse your body with warm water from the neck down.  8.  DO NOT shower/wash with your normal soap after using and rinsing off the CHG Soap.                9.  Pat yourself dry with a clean towel.            10.  Wear clean pajamas.            11.  Place clean sheets on your bed the night of your first shower and do not  sleep with pets. Day of Surgery : Do not apply any lotions/deodorants the morning of surgery.  Please wear clean clothes to the hospital/surgery center.  FAILURE TO FOLLOW THESE INSTRUCTIONS MAY RESULT IN THE CANCELLATION OF YOUR SURGERY  PATIENT SIGNATURE_________________________________  NURSE SIGNATURE__________________________________  ________________________________________________________________________    Rogelia Mire  An incentive spirometer is a tool that can help keep your lungs clear and active. This tool measures how well you are filling your lungs with each breath. Taking long deep breaths may help reverse or decrease the chance of developing breathing (pulmonary) problems (especially infection) following:  A long period of time when you are unable to move or be active. BEFORE THE PROCEDURE   If the spirometer includes an indicator to show your best effort, your nurse or respiratory therapist will set it to a desired goal.  If possible, sit up straight or lean slightly forward. Try not to slouch.  Hold the incentive spirometer in  an upright position. INSTRUCTIONS FOR USE  1. Sit on the edge of your bed if possible, or sit up as far as you can in bed or on a chair. 2. Hold the incentive spirometer in an upright position. 3. Breathe out normally. 4. Place the mouthpiece in your mouth and seal your lips tightly around it. 5. Breathe in slowly and as deeply as possible, raising the piston or the ball toward the top of the column. 6. Hold your breath for 3-5 seconds or for as long as possible. Allow the piston or ball to fall to the bottom of the column. 7. Remove the mouthpiece from your mouth and breathe out normally. 8. Rest for a few seconds and repeat Steps 1 through 7 at least 10 times every 1-2 hours when you are awake. Take your time and take a few normal breaths between deep breaths. 9. The spirometer may include an indicator to show your best effort. Use the indicator as a goal to work toward during each repetition. 10. After each set of 10 deep breaths, practice coughing to be sure your lungs are clear. If you have an incision (the cut made at the time of surgery), support your incision when coughing by placing a pillow or rolled up towels firmly against it. Once you are able to get out of bed, walk around indoors and cough well. You may stop using the incentive spirometer when instructed by your caregiver.  RISKS AND COMPLICATIONS  Take your time so you do not get dizzy or light-headed.  If you are in pain, you may need to take or ask for pain medication before doing incentive spirometry. It is harder to take a deep breath if you are having pain. AFTER USE  Rest and breathe slowly and easily.  It can be helpful to keep track of a log of your progress. Your caregiver can provide you  with a simple table to help with this. If you are using the spirometer at home, follow these instructions: SEEK MEDICAL CARE IF:   You are having difficultly using the spirometer.  You have trouble using the spirometer as often  as instructed.  Your pain medication is not giving enough relief while using the spirometer.  You develop fever of 100.5 F (38.1 C) or higher. SEEK IMMEDIATE MEDICAL CARE IF:   You cough up bloody sputum that had not been present before.  You develop fever of 102 F (38.9 C) or greater.  You develop worsening pain at or near the incision site. MAKE SURE YOU:   Understand these instructions.  Will watch your condition.  Will get help right away if you are not doing well or get worse. Document Released: 07/31/2006 Document Revised: 06/12/2011 Document Reviewed: 10/01/2006 [facility] Patient Information 2014 [place], [place].   ________________________________________________________________________

## 2020-09-01 NOTE — H&P (View-Only) (Signed)
[name]   Patient Care Team: Soundra Pilon, FNP as PCP - General (Family Medicine) Karie Soda, MD as Consulting Physician (Colon and Rectal Surgery) Nada Libman, MD as Consulting Physician (Vascular Surgery) Kerin Salen, MD as Consulting Physician (Gastroenterology)   Marland Kitchen The patient returns s/p Gertie Gowda sigmoid colectomy/colostomy for perforated diverticulitis and abscesses 11/25/2019      The patient returns to clinic after surgery, gradually improving. Appetite is good. Energy level good. Her ostomy appliance can stay on for at least 3 or 4 days. Usually changes every couple days. Has a bowel movement once a day. Feels some swelling around the ostomy wonders if she has a parastomal hernia. Gets embarrassed by the flatus and gas. Walking more. Energy level better. Denies nausea, constipation/diarrhea, worsening fatigue, high fevers, or other concerns.  She was found to have an iliofemoral venous thrombosis. She was rather anemic perioperatively and is a Jehovah's Witness so IVC filter placed at first. Now anticoagulated on Xerelto. It has been 6 months. Normally they would remove the filter and Xerelto that were holding off until decision made for ostomy takedown. Patient denies any cardiac or pulmonary issues. Blood pressure control. Does not smoke. She is not diabetic. She did have some urinary discomfort. Urinalysis eating urine culture was negative according to primary care physician last week.  She densely wishes for a colostomy takedown. There is discussion about having colonoscopy preoperatively his she's never had one. She is in [facility]. I think there are issues with communications if she is has not seen a gastroenterologist about that yet  ` ###########################################  Pathology: SURGICAL PATHOLOGY CASE: [id number] PATIENT: [name] Surgical Pathology Report     Clinical  History: diverticulitis, intraabdominal abscess (cm)     FINAL MICROSCOPIC DIAGNOSIS:  A. COLON, SIGMOID, RESECTION: - Segment of colon (25 cm) showing diverticulosis, diverticulitis with associated abscesses, serositis and serosal adhesions - Serosal adhesions to portion of benign ovary - Endometriosis - Margins appear viable  B. SMALL BOWEL, SEGMENT OF ILEUM, RESECTION: - Segment of small intestine (11.5 cm) with serositis and serosal adhesions - Margins appear viable     Japleen Tornow DESCRIPTION:  A: The specimen is received fresh labeled sigmoid colon, and consists of a portion of colon with 2 stapled resection margins measuring 25.0 cm in length. The serosal surface is tan-red, hyperemic, with a 13.0 cm in length area of red-brown, roughened, indurated serosa and surrounding adipose tissue. Multiple full-thickness perforations are noted within the area of interest. The lumen is filled with a moderate amount of brown-green fecal material. Mucosa is tan-pink with normal folding, multiple diverticuli are noted throughout the length of the specimen. No mucosal lesions are grossly identified. Wall ranges from 0.3 to 1.3 cm in thickness. Indurated tissue noted on the serosal surface displays a tan-white, whorled, fibrotic cut surface. No enlarged lymph nodes are grossly identified. Representative sections are submitted in 7 cassettes. 1 and 2 = opposing margins 3 and 4 = representative sections of perforations 5 and 6 = indurated, fibrotic tissue 7 = diverticulum  B: The specimen is received fresh labeled segment of ileum, and consists of a segment of bowel with 2 stapled resection margins measuring 11.5 cm in length. The serosal surface is tan-pink, hyperemic, with a 2.0 cm in length area of red, roughened, indurated serosa noted and a portion of possibly adherent gray-purple, indurated adipose tissue. Lumen is filled with a moderate amount of tan-green  partially digested material. Mucosa is tan-pink with  normal folding. Wall ranges from 0.2 to 1.0 cm in thickness. Representative sections are submitted in 5 cassettes. 1 and 2 = opposing margins 3-5 = representative sections to include roughened serosa and thickened wall Lovey Newcomer 11/26/2019)    Final Diagnosis performed by Holley Bouche, MD. Electronically signed 11/26/2019 Technical component performed at [facility]. [facility], [address], [place], [place] [address]. Professional component performed at [facility], [address] [place], [place] [address]. Immunohistochemistry Technical component (if applicable) was performed at [facility]. [address], [place], [place] [address]. IMMUNOHISTOCHEMISTRY DISCLAIMER (if applicable): Some of these immunohistochemical stains may have been developed and the performance characteristics determine by [facility]. Some may not have been cleared or approved by the U.S. Food and Drug Administration. The FDA has determined that such clearance or approval is not necessary. This test is used for clinical purposes. It should not be regarded as investigational or for research. This laboratory is certified under the Clinical Laboratory Improvement Amendments of 1988 (CLIA-88) as qualified to perform high complexity clinical laboratory testing. The controls stained appropriately. ` ###########################################`  PREOPERATIVE DIAGNOSIS: Perforated diverticulitis.  POSTOPERATIVE DIAGNOSIS: Perforated diverticulitis.  PROCEDURE PERFORMED: Open sigmoid colectomy and end colostomy (Hartmann's procedure), small bowel resection, incision and drainage of abdominal wall abscess with debridement of skin and subcutaneous fatty tissue  SURGEON: Almond Lint, MD  ASSISTANT: Carlena Bjornstad, PA-C  ANESTHESIA: General.  FINDINGS: feculent peritonitis with  purulent drainage present upon entry unto abdominal cavity generalized to lower abdomen and pelvis. Numerous diverticuli along sigmoid colon.  ESTIMATED BLOOD LOSS: 500 mL. Additional 950 mL of stool from abdominal wall abscess  COMPLICATIONS: None known.  PROCEDURE: Patient was identified in the holding area and taken to the operating room where she was placed supine on the operating room table. General anesthesia was induced. Foley catheter was placed. The abdomen was prepped and draped in a sterile fashion. Time-out was performed according to the surgical safety check list. When all was correct we continued.  Two drains were in the anterior abdominal wall. There was stool draining around the drains. A small incision was made in the skin and the pus/stool was evacuated. The skin was then incised in the midline and the remaining stool/pus in the anterior abdominal wall was cleaned up with pulse lavage. Some of the skin was necrotic as it was so tense. The fascia was then opened in the midline. The skin incision was carried out to above the umbilicus. The fascia was entered superiorly above the inflammatory changes. The fascia was then elevated with Kocher clamps while the colon and small bowel were taken off the lower anterior abdominal wall. The peritoneum was inflamed throughout the lower half of the abdomen with pus and stool. Sharp dissection was used to take the bowel down. The Balfour self retaining retractor was placed. Once the superior rectum/distal sigmoid was located, the contour stapler was used to divide the bowel.   The proximal sigmoid colon was still quite inflamed. This was taken down laterally sharply. The white line of Toldt of the descending colon was taken down with the Bovie electrocautery proximally along the descending colon. The Ligasure was used to divide the mesocolon high up near the colon wall starting at the distal stapled end. Once adequate  length of descending colon had been mobilized, the proximal sigmoid was then divided with the GIA. The abdomen was then irrigated copiously. There had been a loop of small bowel that  was very close to the drain site and the perforation. This had a thinned out area of serosa. Given the magnitude of inflammation present, it was felt that this would not hold sutures to oversew the serosal defect. This small segment of ileum, around 3 inches, was resected with the GIA 75 mm stapler, and a side-to-side, functional end-to-end anastamosis was created.   Attention was directed to the left abdomen for a location for an ostomy. Kochers were used to pull the fascia to the midline and an another Zannie Cove was used to elevate the skin to create a circular site. This was placed superior to the abscess cavity. A divot of fatty tissue was taken as well. The fascia was opened in a cruciate fashion separating the longitudinal fibers of the rectus. A lap was placed behind the fascia to ensure that the Bovie could not go through and puncture any of the intra-abdominal organs. A Tanja Port was then advanced through the abdominal wall and used to retract the stump of the descending colon through the abdominal wall.  The abdomen was then closed using running #1 looped PDS sutures. The abscess cavity was irrigated with the pulse lavage. The necrotic skin was debrided sharply with the cautery. The wound was then irrigated and packed with moist betadine soaked Kerlix.  The colon was then opened with the Bovie. 3-0 Vicryl interrupted sutures were used to create the ostomy. This was then dressed with a 2 piece wafer.  The patient was allowed to emerge from anesthesia and taken to the PACU in stable condition. Needle, sponge, and instrument counts were correct x 2.      Allergies Marliss Coots, CNA; 05/18/2020 11:29 AM) Aspir-Low *ANALGESICS - NonNarcotic* Allergies Reconciled  Medication History  Marliss Coots, CNA; 05/18/2020 11:29 AM) Sherren Mocha Supplies (1 (one) 2-3 times a week, Taken starting 12/29/2019) Active. (Flange and bags for colostomy, colostomy supplies) Methocarbamol (500MG  Tablet, 1 (one) Oral every eight hours, as needed, Taken starting 03/01/2020) Active. Hyoscyamine Sulfate (0.125MG  Tablet, Oral) Active. ALPRAZolam (0.5MG  Tablet, Oral) Active. Xarelto Starter Pack (15 & 20MG  Tab Ther Pack, Oral) Active. Ondansetron (4MG  Tablet Disint, Oral) Active. Losartan Potassium-HCTZ (50-12.5MG  Tablet, Oral) Active. Medications Reconciled     Physical Exam Ardeth Sportsman MD; 05/18/2020 11:50 AM)  General Mental Status-Alert. General Appearance-Not in acute distress, Not Sickly. Orientation-Oriented X3. Hydration-Well hydrated. Voice-Normal.  Integumentary Global Assessment Upon inspection and palpation of skin surfaces of the - Axillae: non-tender, no inflammation or ulceration, no drainage. and Distribution of scalp and body hair is normal. General Characteristics Temperature - normal warmth is noted.  Head and Neck Head-normocephalic, atraumatic with no lesions or palpable masses. Face Global Assessment - atraumatic, no absence of expression. Neck Global Assessment - no abnormal movements, no bruit auscultated on the right, no bruit auscultated on the left, no decreased range of motion, non-tender. Trachea-midline. Thyroid Gland Characteristics - non-tender.  Eye Eyeball - Left-Extraocular movements intact, No Nystagmus - Left. Eyeball - Right-Extraocular movements intact, No Nystagmus - Right. Cornea - Left-No Hazy - Left. Cornea - Right-No Hazy - Right. Sclera/Conjunctiva - Left-No scleral icterus, No Discharge - Left. Sclera/Conjunctiva - Right-No scleral icterus, No Discharge - Right. Pupil - Left-Direct reaction to light normal. Pupil - Right-Direct reaction to light normal.  ENMT Ears Pinna - Left -  no drainage observed, no generalized tenderness observed. Pinna - Right - no drainage observed, no generalized tenderness observed. Nose and Sinuses External Inspection of the Nose - no destructive lesion observed. Inspection of the  nares - Left - quiet respiration. Inspection of the nares - Right - quiet respiration. Mouth and Throat Lips - Upper Lip - no fissures observed, no pallor noted. Lower Lip - no fissures observed, no pallor noted. Nasopharynx - no discharge present. Oral Cavity/Oropharynx - Tongue - no dryness observed. Oral Mucosa - no cyanosis observed. Hypopharynx - no evidence of airway distress observed.  Chest and Lung Exam Inspection Movements - Normal and Symmetrical. Accessory muscles - No use of accessory muscles in breathing. Palpation Palpation of the chest reveals - Non-tender. Auscultation Breath sounds - Normal and Clear.  Cardiovascular Auscultation Rhythm - Regular. Murmurs & Other Heart Sounds - Auscultation of the heart reveals - No Murmurs and No Systolic Clicks.  Abdomen Inspection Inspection of the abdomen reveals - No Visible peristalsis and No Abnormal pulsations. Umbilicus - No Bleeding, No Urine drainage. Palpation/Percussion Palpation and Percussion of the abdomen reveal - Soft, Non Tender, No Rebound tenderness, No Rigidity (guarding) and No Cutaneous hyperesthesia. Note: Abdomen soft. Not severely distended. No distasis recti. No umbilical or other anterior abdominal wall hernias  Female Genitourinary Sexual Maturity Tanner 5 - Adult hair pattern. Note: No vaginal bleeding nor discharge  Peripheral Vascular Upper Extremity Inspection - Left - No Cyanotic nailbeds - Left, Not Ischemic. Inspection - Right - No Cyanotic nailbeds - Right, Not Ischemic.  Neurologic Neurologic evaluation reveals -normal attention span and ability to concentrate, able to name objects and repeat phrases. Appropriate fund of knowledge , normal sensation  and normal coordination. Mental Status Affect - not angry, not paranoid. Cranial Nerves-Normal Bilaterally. Gait-Normal.  Neuropsychiatric Mental status exam performed with findings of-able to articulate well with normal speech/language, rate, volume and coherence, thought content normal with ability to perform basic computations and apply abstract reasoning and no evidence of hallucinations, delusions, obsessions or homicidal/suicidal ideation.  Musculoskeletal Global Assessment Spine, Ribs and Pelvis - no instability, subluxation or laxity. Right Upper Extremity - no instability, subluxation or laxity.  Lymphatic Head & Neck  General Head & Neck Lymphatics: Bilateral - Description - No Localized lymphadenopathy. Axillary  General Axillary Region: Bilateral - Description - No Localized lymphadenopathy. Femoral & Inguinal  Generalized Femoral & Inguinal Lymphatics: Left - Description - No Localized lymphadenopathy. Right - Description - No Localized lymphadenopathy.    Assessment & Plan   STATUS POST HARTMANN PROCEDURE (Z93.3) Impression: Recovering status post open Hartmann resection for complicated perforated diverticulitis and numerous abscesses despise antibiotics and drainage in August.  Is appropriate for colostomy takedown at this time. Reasonable minimally invasive robotic approach.  Plan colonoscopy before surgery. See if we can coordinate the day before.  That way, the patient just gets one bowel prep. She is interested in that. We will try and regenerative [facility] again. I gave her my card and asked her to try and reach out as well. Call us if she is having issues.  I believe her vascular surgeon wants to holld off on IVC filter removal until she is more than a few weeks out from her colostomy takedown.  Vaccinations to be done  Current Plans The anatomy & physiology of the digestive tract was discussed. The pathophysiology was  discussed. Possibility of remaining with an ostomy permanently was discussed. I offered ostomy takedown. Laparoscopic & open techniques were discussed.  Risks such as bleeding, infection, abscess, leak, reoperation, possible re-ostomy, injury to other organs, hernia, heart attack, death, and other risks were discussed. I noted a good likelihood this will help address the problem.  Goals of post-operative recovery were discussed as well. We will work to minimize complications. Questions were answered. The patient expresses understanding & wishes to proceed with surgery.  Written instructions provided Pt Education - [facility] Colon Bowel Prep 2018 ERAS/Miralax/Antibiotics Started Neomycin Sulfate 500 MG Oral Tablet, 2 (two) Tablet SEE NOTE, #6, 05/18/2020, No Refill. Local Order: Pharmacist Notes: TAKE TWO TABLETS AT 2 PM, 3 PM, AND 10 PM THE DAY PRIOR TO SURGERY Pt Education - Pamphlet Given - Laparoscopic Colorectal Surgery: discussed with patient and provided information. Pt Education - [facility] Colectomy post-op instructions: discussed with patient and provided information. Started metroNIDAZOLE 500 MG Oral Tablet, 2 (two) Tablet three times daily, #6, 1 day starting 05/18/2020, No Refill. Local Order: Pharmacist Notes: Pharmacy Instructions: Take 2 tablets at 2pm, 3pm, and 10pm the day prior to your colon operation.  DIVERTICULITIS (K57.92) Impression: Pt referred to GI for colonoscopy.  Will then refer to colorectal here for consideration of lap or robotic ostomy takedown. I advised that she will need to have the scope prior to takedown as well as be back to normal energy level.  Current Plans Instructed to schedule colonoscopywith a gastroenterologist Pt Education - [facility] Diverticular Disease (AT)  COLOSTOMY IN PLACE (Z93.3)  Current Plans Pt Education - [facility] Ostomy HCI (Saahil Herbster): discussed with patient and provided information.  LEFT LEG DVT (I82.402) Impression:  filter/xarelto/compression stocking per Vascular surgery   PRESENCE OF IVC FILTER (L87.564) Impression: IVC filter for iliofemoral DVT in the setting of not able to be initially anticoagulated around the time of emergency surgery. Plan for filter removal once she recovers from her colostomy takedown.   ANTICOAGULATED (Z79.01) Impression: Anticoagulated Xerelto. Should be able to 2 days preop. She may be at the point where he can be stopped since been 6 months. I'll defer to her primary care physician and vascular surgeon.   PATIENT IS JEHOVAH'S WITNESS (Z78.9)  Ardeth Sportsman, MD, FACS, MASCRS Gastrointestinal and Minimally Invasive Surgery  [facility] [address] [place], [place] [address] [phone] Fax [phone] Main/Paging  CONTACT INFORMATION: Weekday (9AM-5PM) concerns: Call [facility] main office at [phone] Weeknight (5PM-9AM) or Weekend/Holiday concerns: Check [email or web address] for General Surgery [facility] coverage (Please, do not use SecureChat as it is not reliable communication to operating surgeons for immediate patient care)

## 2020-09-01 NOTE — H&P (Signed)
[name]   Patient Care Team: Brake, Andrew R, FNP as PCP - General (Family Medicine) Valton Schwartz, MD as Consulting Physician (Colon and Rectal Surgery) Brabham, Vance W, MD as Consulting Physician (Vascular Surgery) Karki, Arya, MD as Consulting Physician (Gastroenterology)   ` ` ` The patient returns s/p Hartmann sigmoid colectomy/colostomy for perforated diverticulitis and abscesses 11/25/2019      The patient returns to clinic after surgery, gradually improving. Appetite is good. Energy level good. Her ostomy appliance can stay on for at least 3 or 4 days. Usually changes every couple days. Has a bowel movement once a day. Feels some swelling around the ostomy wonders if she has a parastomal hernia. Gets embarrassed by the flatus and gas. Walking more. Energy level better. Denies nausea, constipation/diarrhea, worsening fatigue, high fevers, or other concerns.  She was found to have an iliofemoral venous thrombosis. She was rather anemic perioperatively and is a Jehovah's Witness so IVC filter placed at first. Now anticoagulated on Xerelto. It has been 6 months. Normally they would remove the filter and Xerelto that were holding off until decision made for ostomy takedown. Patient denies any cardiac or pulmonary issues. Blood pressure control. Does not smoke. She is not diabetic. She did have some urinary discomfort. Urinalysis eating urine culture was negative according to primary care physician last week.  She densely wishes for a colostomy takedown. There is discussion about having colonoscopy preoperatively his she's never had one. She is in [facility]. I think there are issues with communications if she is has not seen a gastroenterologist about that yet  ` ###########################################  Pathology: SURGICAL PATHOLOGY CASE: [id number] PATIENT: [name] Surgical Pathology Report     Clinical  History: diverticulitis, intraabdominal abscess (cm)     FINAL MICROSCOPIC DIAGNOSIS:  A. COLON, SIGMOID, RESECTION: - Segment of colon (25 cm) showing diverticulosis, diverticulitis with associated abscesses, serositis and serosal adhesions - Serosal adhesions to portion of benign ovary - Endometriosis - Margins appear viable  B. SMALL BOWEL, SEGMENT OF ILEUM, RESECTION: - Segment of small intestine (11.5 cm) with serositis and serosal adhesions - Margins appear viable     Sahith Nurse DESCRIPTION:  A: The specimen is received fresh labeled sigmoid colon, and consists of a portion of colon with 2 stapled resection margins measuring 25.0 cm in length. The serosal surface is tan-red, hyperemic, with a 13.0 cm in length area of red-brown, roughened, indurated serosa and surrounding adipose tissue. Multiple full-thickness perforations are noted within the area of interest. The lumen is filled with a moderate amount of brown-green fecal material. Mucosa is tan-pink with normal folding, multiple diverticuli are noted throughout the length of the specimen. No mucosal lesions are grossly identified. Wall ranges from 0.3 to 1.3 cm in thickness. Indurated tissue noted on the serosal surface displays a tan-white, whorled, fibrotic cut surface. No enlarged lymph nodes are grossly identified. Representative sections are submitted in 7 cassettes. 1 and 2 = opposing margins 3 and 4 = representative sections of perforations 5 and 6 = indurated, fibrotic tissue 7 = diverticulum  B: The specimen is received fresh labeled segment of ileum, and consists of a segment of bowel with 2 stapled resection margins measuring 11.5 cm in length. The serosal surface is tan-pink, hyperemic, with a 2.0 cm in length area of red, roughened, indurated serosa noted and a portion of possibly adherent gray-purple, indurated adipose tissue. Lumen is filled with a moderate amount of tan-green  partially digested material. Mucosa is tan-pink with   normal folding. Wall ranges from 0.2 to 1.0 cm in thickness. Representative sections are submitted in 5 cassettes. 1 and 2 = opposing margins 3-5 = representative sections to include roughened serosa and thickened wall Lovey Newcomer 11/26/2019)    Final Diagnosis performed by Holley Bouche, MD. Electronically signed 11/26/2019 Technical component performed at [facility]. [facility], [address], [place], [place] [address]. Professional component performed at [facility], [address] [place], [place] [address]. Immunohistochemistry Technical component (if applicable) was performed at [facility]. [address], [place], [place] [address]. IMMUNOHISTOCHEMISTRY DISCLAIMER (if applicable): Some of these immunohistochemical stains may have been developed and the performance characteristics determine by [facility]. Some may not have been cleared or approved by the U.S. Food and Drug Administration. The FDA has determined that such clearance or approval is not necessary. This test is used for clinical purposes. It should not be regarded as investigational or for research. This laboratory is certified under the Clinical Laboratory Improvement Amendments of 1988 (CLIA-88) as qualified to perform high complexity clinical laboratory testing. The controls stained appropriately. ` ###########################################`  PREOPERATIVE DIAGNOSIS: Perforated diverticulitis.  POSTOPERATIVE DIAGNOSIS: Perforated diverticulitis.  PROCEDURE PERFORMED: Open sigmoid colectomy and end colostomy (Hartmann's procedure), small bowel resection, incision and drainage of abdominal wall abscess with debridement of skin and subcutaneous fatty tissue  SURGEON: Almond Lint, MD  ASSISTANT: Carlena Bjornstad, PA-C  ANESTHESIA: General.  FINDINGS: feculent peritonitis with  purulent drainage present upon entry unto abdominal cavity generalized to lower abdomen and pelvis. Numerous diverticuli along sigmoid colon.  ESTIMATED BLOOD LOSS: 500 mL. Additional 950 mL of stool from abdominal wall abscess  COMPLICATIONS: None known.  PROCEDURE: Patient was identified in the holding area and taken to the operating room where she was placed supine on the operating room table. General anesthesia was induced. Foley catheter was placed. The abdomen was prepped and draped in a sterile fashion. Time-out was performed according to the surgical safety check list. When all was correct we continued.  Two drains were in the anterior abdominal wall. There was stool draining around the drains. A small incision was made in the skin and the pus/stool was evacuated. The skin was then incised in the midline and the remaining stool/pus in the anterior abdominal wall was cleaned up with pulse lavage. Some of the skin was necrotic as it was so tense. The fascia was then opened in the midline. The skin incision was carried out to above the umbilicus. The fascia was entered superiorly above the inflammatory changes. The fascia was then elevated with Kocher clamps while the colon and small bowel were taken off the lower anterior abdominal wall. The peritoneum was inflamed throughout the lower half of the abdomen with pus and stool. Sharp dissection was used to take the bowel down. The Balfour self retaining retractor was placed. Once the superior rectum/distal sigmoid was located, the contour stapler was used to divide the bowel.   The proximal sigmoid colon was still quite inflamed. This was taken down laterally sharply. The white line of Toldt of the descending colon was taken down with the Bovie electrocautery proximally along the descending colon. The Ligasure was used to divide the mesocolon high up near the colon wall starting at the distal stapled end. Once adequate  length of descending colon had been mobilized, the proximal sigmoid was then divided with the GIA. The abdomen was then irrigated copiously. There had been a loop of small bowel that  was very close to the drain site and the perforation. This had a thinned out area of serosa. Given the magnitude of inflammation present, it was felt that this would not hold sutures to oversew the serosal defect. This small segment of ileum, around 3 inches, was resected with the GIA 75 mm stapler, and a side-to-side, functional end-to-end anastamosis was created.   Attention was directed to the left abdomen for a location for an ostomy. Kochers were used to pull the fascia to the midline and an another Zannie Cove was used to elevate the skin to create a circular site. This was placed superior to the abscess cavity. A divot of fatty tissue was taken as well. The fascia was opened in a cruciate fashion separating the longitudinal fibers of the rectus. A lap was placed behind the fascia to ensure that the Bovie could not go through and puncture any of the intra-abdominal organs. A Tanja Port was then advanced through the abdominal wall and used to retract the stump of the descending colon through the abdominal wall.  The abdomen was then closed using running #1 looped PDS sutures. The abscess cavity was irrigated with the pulse lavage. The necrotic skin was debrided sharply with the cautery. The wound was then irrigated and packed with moist betadine soaked Kerlix.  The colon was then opened with the Bovie. 3-0 Vicryl interrupted sutures were used to create the ostomy. This was then dressed with a 2 piece wafer.  The patient was allowed to emerge from anesthesia and taken to the PACU in stable condition. Needle, sponge, and instrument counts were correct x 2.      Allergies Marliss Coots, CNA; 05/18/2020 11:29 AM) Aspir-Low *ANALGESICS - NonNarcotic* Allergies Reconciled  Medication History  Marliss Coots, CNA; 05/18/2020 11:29 AM) Sherren Mocha Supplies (1 (one) 2-3 times a week, Taken starting 12/29/2019) Active. (Flange and bags for colostomy, colostomy supplies) Methocarbamol (500MG  Tablet, 1 (one) Oral every eight hours, as needed, Taken starting 03/01/2020) Active. Hyoscyamine Sulfate (0.125MG  Tablet, Oral) Active. ALPRAZolam (0.5MG  Tablet, Oral) Active. Xarelto Starter Pack (15 & 20MG  Tab Ther Pack, Oral) Active. Ondansetron (4MG  Tablet Disint, Oral) Active. Losartan Potassium-HCTZ (50-12.5MG  Tablet, Oral) Active. Medications Reconciled     Physical Exam Ardeth Sportsman MD; 05/18/2020 11:50 AM)  General Mental Status-Alert. General Appearance-Not in acute distress, Not Sickly. Orientation-Oriented X3. Hydration-Well hydrated. Voice-Normal.  Integumentary Global Assessment Upon inspection and palpation of skin surfaces of the - Axillae: non-tender, no inflammation or ulceration, no drainage. and Distribution of scalp and body hair is normal. General Characteristics Temperature - normal warmth is noted.  Head and Neck Head-normocephalic, atraumatic with no lesions or palpable masses. Face Global Assessment - atraumatic, no absence of expression. Neck Global Assessment - no abnormal movements, no bruit auscultated on the right, no bruit auscultated on the left, no decreased range of motion, non-tender. Trachea-midline. Thyroid Gland Characteristics - non-tender.  Eye Eyeball - Left-Extraocular movements intact, No Nystagmus - Left. Eyeball - Right-Extraocular movements intact, No Nystagmus - Right. Cornea - Left-No Hazy - Left. Cornea - Right-No Hazy - Right. Sclera/Conjunctiva - Left-No scleral icterus, No Discharge - Left. Sclera/Conjunctiva - Right-No scleral icterus, No Discharge - Right. Pupil - Left-Direct reaction to light normal. Pupil - Right-Direct reaction to light normal.  ENMT Ears Pinna - Left -  no drainage observed, no generalized tenderness observed. Pinna - Right - no drainage observed, no generalized tenderness observed. Nose and Sinuses External Inspection of the Nose - no destructive lesion observed. Inspection of the  nares - Left - quiet respiration. Inspection of the nares - Right - quiet respiration. Mouth and Throat Lips - Upper Lip - no fissures observed, no pallor noted. Lower Lip - no fissures observed, no pallor noted. Nasopharynx - no discharge present. Oral Cavity/Oropharynx - Tongue - no dryness observed. Oral Mucosa - no cyanosis observed. Hypopharynx - no evidence of airway distress observed.  Chest and Lung Exam Inspection Movements - Normal and Symmetrical. Accessory muscles - No use of accessory muscles in breathing. Palpation Palpation of the chest reveals - Non-tender. Auscultation Breath sounds - Normal and Clear.  Cardiovascular Auscultation Rhythm - Regular. Murmurs & Other Heart Sounds - Auscultation of the heart reveals - No Murmurs and No Systolic Clicks.  Abdomen Inspection Inspection of the abdomen reveals - No Visible peristalsis and No Abnormal pulsations. Umbilicus - No Bleeding, No Urine drainage. Palpation/Percussion Palpation and Percussion of the abdomen reveal - Soft, Non Tender, No Rebound tenderness, No Rigidity (guarding) and No Cutaneous hyperesthesia. Note: Abdomen soft. Not severely distended. No distasis recti. No umbilical or other anterior abdominal wall hernias  Female Genitourinary Sexual Maturity Tanner 5 - Adult hair pattern. Note: No vaginal bleeding nor discharge  Peripheral Vascular Upper Extremity Inspection - Left - No Cyanotic nailbeds - Left, Not Ischemic. Inspection - Right - No Cyanotic nailbeds - Right, Not Ischemic.  Neurologic Neurologic evaluation reveals -normal attention span and ability to concentrate, able to name objects and repeat phrases. Appropriate fund of knowledge , normal sensation  and normal coordination. Mental Status Affect - not angry, not paranoid. Cranial Nerves-Normal Bilaterally. Gait-Normal.  Neuropsychiatric Mental status exam performed with findings of-able to articulate well with normal speech/language, rate, volume and coherence, thought content normal with ability to perform basic computations and apply abstract reasoning and no evidence of hallucinations, delusions, obsessions or homicidal/suicidal ideation.  Musculoskeletal Global Assessment Spine, Ribs and Pelvis - no instability, subluxation or laxity. Right Upper Extremity - no instability, subluxation or laxity.  Lymphatic Head & Neck  General Head & Neck Lymphatics: Bilateral - Description - No Localized lymphadenopathy. Axillary  General Axillary Region: Bilateral - Description - No Localized lymphadenopathy. Femoral & Inguinal  Generalized Femoral & Inguinal Lymphatics: Left - Description - No Localized lymphadenopathy. Right - Description - No Localized lymphadenopathy.    Assessment & Plan   STATUS POST HARTMANN PROCEDURE (Z93.3) Impression: Recovering status post open Hartmann resection for complicated perforated diverticulitis and numerous abscesses despise antibiotics and drainage in August.  Is appropriate for colostomy takedown at this time. Reasonable minimally invasive robotic approach.  Plan colonoscopy before surgery. See if we can coordinate the day before.  That way, the patient just gets one bowel prep. She is interested in that. We will try and regenerative [facility] again. I gave her my card and asked her to try and reach out as well. Call us if she is having issues.  I believe her vascular surgeon wants to holld off on IVC filter removal until she is more than a few weeks out from her colostomy takedown.  Vaccinations to be done  Current Plans The anatomy & physiology of the digestive tract was discussed. The pathophysiology was  discussed. Possibility of remaining with an ostomy permanently was discussed. I offered ostomy takedown. Laparoscopic & open techniques were discussed.  Risks such as bleeding, infection, abscess, leak, reoperation, possible re-ostomy, injury to other organs, hernia, heart attack, death, and other risks were discussed. I noted a good likelihood this will help address the problem.  Goals of post-operative recovery were discussed as well. We will work to minimize complications. Questions were answered. The patient expresses understanding & wishes to proceed with surgery.  Written instructions provided Pt Education - [facility] Colon Bowel Prep 2018 ERAS/Miralax/Antibiotics Started Neomycin Sulfate 500 MG Oral Tablet, 2 (two) Tablet SEE NOTE, #6, 05/18/2020, No Refill. Local Order: Pharmacist Notes: TAKE TWO TABLETS AT 2 PM, 3 PM, AND 10 PM THE DAY PRIOR TO SURGERY Pt Education - Pamphlet Given - Laparoscopic Colorectal Surgery: discussed with patient and provided information. Pt Education - [facility] Colectomy post-op instructions: discussed with patient and provided information. Started metroNIDAZOLE 500 MG Oral Tablet, 2 (two) Tablet three times daily, #6, 1 day starting 05/18/2020, No Refill. Local Order: Pharmacist Notes: Pharmacy Instructions: Take 2 tablets at 2pm, 3pm, and 10pm the day prior to your colon operation.  DIVERTICULITIS (K57.92) Impression: Pt referred to GI for colonoscopy.  Will then refer to colorectal here for consideration of lap or robotic ostomy takedown. I advised that she will need to have the scope prior to takedown as well as be back to normal energy level.  Current Plans Instructed to schedule colonoscopywith a gastroenterologist Pt Education - [facility] Diverticular Disease (AT)  COLOSTOMY IN PLACE (Z93.3)  Current Plans Pt Education - [facility] Ostomy HCI (Jaishawn Witzke): discussed with patient and provided information.  LEFT LEG DVT (I82.402) Impression:  filter/xarelto/compression stocking per Vascular surgery   PRESENCE OF IVC FILTER (L87.564) Impression: IVC filter for iliofemoral DVT in the setting of not able to be initially anticoagulated around the time of emergency surgery. Plan for filter removal once she recovers from her colostomy takedown.   ANTICOAGULATED (Z79.01) Impression: Anticoagulated Xerelto. Should be able to 2 days preop. She may be at the point where he can be stopped since been 6 months. I'll defer to her primary care physician and vascular surgeon.   PATIENT IS JEHOVAH'S WITNESS (Z78.9)  Ardeth Sportsman, MD, FACS, MASCRS Gastrointestinal and Minimally Invasive Surgery  [facility] [address] [place], [place] [address] [phone] Fax [phone] Main/Paging  CONTACT INFORMATION: Weekday (9AM-5PM) concerns: Call [facility] main office at [phone] Weeknight (5PM-9AM) or Weekend/Holiday concerns: Check [email or web address] for General Surgery [facility] coverage (Please, do not use SecureChat as it is not reliable communication to operating surgeons for immediate patient care)

## 2020-09-02 ENCOUNTER — Encounter (HOSPITAL_COMMUNITY)
Admission: RE | Admit: 2020-09-02 | Discharge: 2020-09-02 | Disposition: A | Discharge status: Home / Self Care | Payer: Commercial Managed Care - PPO | Source: Ambulatory Visit | Attending: Surgery | Admitting: Surgery

## 2020-09-02 ENCOUNTER — Other Ambulatory Visit: Payer: Self-pay

## 2020-09-02 DIAGNOSIS — Z01812 Encounter for preprocedural laboratory examination: Secondary | ICD-10-CM | POA: Insufficient documentation

## 2020-09-02 LAB — CBC
HCT: 38.2 % (ref 36.0–46.0)
Hemoglobin: 13 g/dL (ref 12.0–15.0)
MCH: 27.2 pg (ref 26.0–34.0)
MCHC: 34 g/dL (ref 30.0–36.0)
MCV: 79.9 fL — ABNORMAL LOW (ref 80.0–100.0)
Platelets: 195 10*3/uL (ref 150–400)
RBC: 4.78 MIL/uL (ref 3.87–5.11)
RDW: 14.7 % (ref 11.5–15.5)
WBC: 7 10*3/uL (ref 4.0–10.5)
nRBC: 0 % (ref 0.0–0.2)

## 2020-09-02 LAB — BASIC METABOLIC PANEL
Anion gap: 6 (ref 5–15)
BUN: 16 mg/dL (ref 6–20)
CO2: 28 mmol/L (ref 22–32)
Calcium: 8.9 mg/dL (ref 8.9–10.3)
Chloride: 104 mmol/L (ref 98–111)
Creatinine, Ser: 0.61 mg/dL (ref 0.44–1.00)
GFR, Estimated: >60 mL/min (ref >60)
Glucose, Bld: 91 mg/dL (ref 70–99)
Potassium: 3.6 mmol/L (ref 3.5–5.1)
Sodium: 138 mmol/L (ref 135–145)

## 2020-09-02 LAB — NO BLOOD PRODUCTS

## 2020-09-03 LAB — HEMOGLOBIN A1C
Hgb A1c MFr Bld: 5.7 % — ABNORMAL HIGH (ref 4.8–5.6)
Mean Plasma Glucose: 117 mg/dL

## 2020-09-06 ENCOUNTER — Other Ambulatory Visit (HOSPITAL_COMMUNITY)
Admission: RE | Admit: 2020-09-06 | Discharge: 2020-09-06 | Disposition: A | Discharge status: Home / Self Care | Payer: Commercial Managed Care - PPO | Source: Ambulatory Visit | Attending: Surgery | Admitting: Surgery

## 2020-09-06 DIAGNOSIS — Z01812 Encounter for preprocedural laboratory examination: Secondary | ICD-10-CM | POA: Insufficient documentation

## 2020-09-06 DIAGNOSIS — Z20822 Contact with and (suspected) exposure to covid-19: Secondary | ICD-10-CM | POA: Insufficient documentation

## 2020-09-07 LAB — SARS CORONAVIRUS 2 (TAT 6-24 HRS): SARS Coronavirus 2: NEGATIVE

## 2020-09-07 MED ORDER — BUPIVACAINE LIPOSOME 1.3 % IJ SUSP
20.0000 mL | INTRAMUSCULAR | Status: DC
  Filled 2020-09-07: qty 20

## 2020-09-08 ENCOUNTER — Inpatient Hospital Stay (HOSPITAL_COMMUNITY): Payer: Commercial Managed Care - PPO | Admitting: Physician Assistant

## 2020-09-08 ENCOUNTER — Encounter (HOSPITAL_COMMUNITY): Payer: Self-pay | Admitting: Surgery

## 2020-09-08 ENCOUNTER — Encounter (HOSPITAL_COMMUNITY)
Admission: RE | Disposition: A | Discharge status: Home / Self Care | Payer: Self-pay | Source: Home / Self Care | Attending: Surgery

## 2020-09-08 ENCOUNTER — Other Ambulatory Visit: Payer: Self-pay

## 2020-09-08 ENCOUNTER — Inpatient Hospital Stay (HOSPITAL_COMMUNITY): Payer: Commercial Managed Care - PPO | Admitting: Registered Nurse

## 2020-09-08 ENCOUNTER — Inpatient Hospital Stay (HOSPITAL_COMMUNITY)
Admission: RE | Admit: 2020-09-08 | Discharge: 2020-09-13 | DRG: 329 | Disposition: A | Discharge status: Home / Self Care | Payer: Commercial Managed Care - PPO | Attending: Surgery | Admitting: Surgery

## 2020-09-08 DIAGNOSIS — K5732 Diverticulitis of large intestine without perforation or abscess without bleeding: Secondary | ICD-10-CM | POA: Diagnosis present

## 2020-09-08 DIAGNOSIS — R7303 Prediabetes: Secondary | ICD-10-CM | POA: Diagnosis present

## 2020-09-08 DIAGNOSIS — K648 Other hemorrhoids: Secondary | ICD-10-CM | POA: Diagnosis present

## 2020-09-08 DIAGNOSIS — Z933 Colostomy status: Secondary | ICD-10-CM

## 2020-09-08 DIAGNOSIS — F418 Other specified anxiety disorders: Secondary | ICD-10-CM | POA: Diagnosis present

## 2020-09-08 DIAGNOSIS — D638 Anemia in other chronic diseases classified elsewhere: Secondary | ICD-10-CM | POA: Diagnosis present

## 2020-09-08 DIAGNOSIS — K660 Peritoneal adhesions (postprocedural) (postinfection): Secondary | ICD-10-CM | POA: Diagnosis present

## 2020-09-08 DIAGNOSIS — Z95828 Presence of other vascular implants and grafts: Secondary | ICD-10-CM

## 2020-09-08 DIAGNOSIS — F419 Anxiety disorder, unspecified: Secondary | ICD-10-CM | POA: Diagnosis present

## 2020-09-08 DIAGNOSIS — K658 Other peritonitis: Secondary | ICD-10-CM | POA: Diagnosis present

## 2020-09-08 DIAGNOSIS — I82422 Acute embolism and thrombosis of left iliac vein: Secondary | ICD-10-CM | POA: Diagnosis present

## 2020-09-08 DIAGNOSIS — K432 Incisional hernia without obstruction or gangrene: Secondary | ICD-10-CM | POA: Diagnosis present

## 2020-09-08 DIAGNOSIS — I10 Essential (primary) hypertension: Secondary | ICD-10-CM | POA: Diagnosis present

## 2020-09-08 DIAGNOSIS — Z531 Procedure and treatment not carried out because of patient's decision for reasons of belief and group pressure: Secondary | ICD-10-CM | POA: Diagnosis present

## 2020-09-08 DIAGNOSIS — Z833 Family history of diabetes mellitus: Secondary | ICD-10-CM

## 2020-09-08 DIAGNOSIS — K5730 Diverticulosis of large intestine without perforation or abscess without bleeding: Secondary | ICD-10-CM

## 2020-09-08 DIAGNOSIS — Z20822 Contact with and (suspected) exposure to covid-19: Secondary | ICD-10-CM | POA: Diagnosis present

## 2020-09-08 DIAGNOSIS — G8929 Other chronic pain: Secondary | ICD-10-CM | POA: Diagnosis present

## 2020-09-08 DIAGNOSIS — IMO0001 Reserved for inherently not codable concepts without codable children: Secondary | ICD-10-CM

## 2020-09-08 DIAGNOSIS — K5720 Diverticulitis of large intestine with perforation and abscess without bleeding: Secondary | ICD-10-CM | POA: Diagnosis present

## 2020-09-08 DIAGNOSIS — Z433 Encounter for attention to colostomy: Secondary | ICD-10-CM | POA: Diagnosis not present

## 2020-09-08 DIAGNOSIS — K435 Parastomal hernia without obstruction or  gangrene: Secondary | ICD-10-CM | POA: Diagnosis present

## 2020-09-08 DIAGNOSIS — Z6841 Body Mass Index (BMI) 40.0 and over, adult: Secondary | ICD-10-CM

## 2020-09-08 DIAGNOSIS — Z8249 Family history of ischemic heart disease and other diseases of the circulatory system: Secondary | ICD-10-CM

## 2020-09-08 HISTORY — PX: LYSIS OF ADHESION: SHX5961

## 2020-09-08 HISTORY — PX: XI ROBOTIC ASSISTED COLOSTOMY TAKEDOWN: SHX6828

## 2020-09-08 HISTORY — PX: PROCTOSCOPY: SHX2266

## 2020-09-08 SURGERY — CLOSURE, COLOSTOMY, ROBOT-ASSISTED
Anesthesia: General

## 2020-09-08 MED ORDER — HYDROCHLOROTHIAZIDE 12.5 MG PO CAPS
12.5000 mg | ORAL_CAPSULE | Freq: Every day | ORAL | Status: DC
  Administered 2020-09-08 – 2020-09-13 (×6): 12.5 mg via ORAL
  Filled 2020-09-08 (×6): qty 1

## 2020-09-08 MED ORDER — TRAMADOL HCL 50 MG PO TABS
50.0000 mg | ORAL_TABLET | Freq: Four times a day (QID) | ORAL | Status: DC | PRN
  Administered 2020-09-08 – 2020-09-09 (×4): 100 mg via ORAL
  Filled 2020-09-08 (×4): qty 2

## 2020-09-08 MED ORDER — ONDANSETRON HCL 4 MG/2ML IJ SOLN
4.0000 mg | Freq: Four times a day (QID) | INTRAMUSCULAR | Status: DC | PRN
  Administered 2020-09-11: 4 mg via INTRAVENOUS
  Filled 2020-09-08: qty 2

## 2020-09-08 MED ORDER — ENSURE PRE-SURGERY PO LIQD: 296.0000 mL | Freq: Once | ORAL | Status: DC

## 2020-09-08 MED ORDER — ONDANSETRON HCL 4 MG/2ML IJ SOLN
INTRAMUSCULAR | Status: DC | PRN
  Administered 2020-09-08: 4 mg via INTRAVENOUS

## 2020-09-08 MED ORDER — GABAPENTIN 300 MG PO CAPS
300.0000 mg | ORAL_CAPSULE | ORAL | Status: AC
  Administered 2020-09-08: 300 mg via ORAL
  Filled 2020-09-08: qty 1

## 2020-09-08 MED ORDER — STERILE WATER FOR INJECTION IJ SOLN
INTRAMUSCULAR | Status: AC
  Filled 2020-09-08: qty 10

## 2020-09-08 MED ORDER — LIDOCAINE HCL 2 % IJ SOLN
INTRAMUSCULAR | Status: AC
  Filled 2020-09-08: qty 20

## 2020-09-08 MED ORDER — ENALAPRILAT 1.25 MG/ML IV SOLN
0.6250 mg | Freq: Four times a day (QID) | INTRAVENOUS | Status: DC | PRN
  Filled 2020-09-08: qty 1

## 2020-09-08 MED ORDER — FENTANYL CITRATE (PF) 100 MCG/2ML IJ SOLN
INTRAMUSCULAR | Status: DC | PRN
  Administered 2020-09-08 (×5): 50 ug via INTRAVENOUS

## 2020-09-08 MED ORDER — ROCURONIUM BROMIDE 10 MG/ML (PF) SYRINGE
PREFILLED_SYRINGE | INTRAVENOUS | Status: DC | PRN
  Administered 2020-09-08: 20 mg via INTRAVENOUS
  Administered 2020-09-08: 80 mg via INTRAVENOUS

## 2020-09-08 MED ORDER — DEXAMETHASONE SODIUM PHOSPHATE 10 MG/ML IJ SOLN
INTRAMUSCULAR | Status: DC | PRN
  Administered 2020-09-08: 8 mg via INTRAVENOUS

## 2020-09-08 MED ORDER — LIDOCAINE 2% (20 MG/ML) 5 ML SYRINGE
INTRAMUSCULAR | Status: DC | PRN
  Administered 2020-09-08: 1.5 mg/kg/h via INTRAVENOUS
  Administered 2020-09-08: 80 mg via INTRAVENOUS

## 2020-09-08 MED ORDER — CELECOXIB 200 MG PO CAPS
200.0000 mg | ORAL_CAPSULE | ORAL | Status: AC
  Administered 2020-09-08: 200 mg via ORAL
  Filled 2020-09-08: qty 1

## 2020-09-08 MED ORDER — ACETAMINOPHEN 500 MG PO TABS
1000.0000 mg | ORAL_TABLET | ORAL | Status: AC
  Administered 2020-09-08: 1000 mg via ORAL
  Filled 2020-09-08: qty 2

## 2020-09-08 MED ORDER — POLYETHYLENE GLYCOL 3350 17 GM/SCOOP PO POWD: 1.0000 | Freq: Once | ORAL | Status: DC

## 2020-09-08 MED ORDER — LACTATED RINGERS IV SOLN: INTRAVENOUS | Status: DC

## 2020-09-08 MED ORDER — PHENYLEPHRINE 40 MCG/ML (10ML) SYRINGE FOR IV PUSH (FOR BLOOD PRESSURE SUPPORT)
PREFILLED_SYRINGE | INTRAVENOUS | Status: AC
  Filled 2020-09-08: qty 10

## 2020-09-08 MED ORDER — ALUM & MAG HYDROXIDE-SIMETH 200-200-20 MG/5ML PO SUSP: 30.0000 mL | Freq: Four times a day (QID) | ORAL | Status: DC | PRN

## 2020-09-08 MED ORDER — ENSURE PRE-SURGERY PO LIQD: 592.0000 mL | Freq: Once | ORAL | Status: DC

## 2020-09-08 MED ORDER — CHLORHEXIDINE GLUCONATE 0.12 % MT SOLN
15.0000 mL | Freq: Once | OROMUCOSAL | Status: AC
  Administered 2020-09-08: 15 mL via OROMUCOSAL

## 2020-09-08 MED ORDER — LACTATED RINGERS IV BOLUS: 1000.0000 mL | Freq: Three times a day (TID) | INTRAVENOUS | Status: AC | PRN

## 2020-09-08 MED ORDER — OXYCODONE HCL 5 MG PO TABS: 5.0000 mg | ORAL_TABLET | Freq: Once | ORAL | Status: DC | PRN

## 2020-09-08 MED ORDER — AMISULPRIDE (ANTIEMETIC) 5 MG/2ML IV SOLN: 10.0000 mg | Freq: Once | INTRAVENOUS | Status: DC | PRN

## 2020-09-08 MED ORDER — MIDAZOLAM HCL 2 MG/2ML IJ SOLN
INTRAMUSCULAR | Status: AC
  Filled 2020-09-08: qty 2

## 2020-09-08 MED ORDER — BUPIVACAINE-EPINEPHRINE (PF) 0.25% -1:200000 IJ SOLN
INTRAMUSCULAR | Status: AC
  Filled 2020-09-08: qty 30

## 2020-09-08 MED ORDER — HYDROMORPHONE HCL 1 MG/ML IJ SOLN
0.5000 mg | INTRAMUSCULAR | Status: DC | PRN
  Administered 2020-09-09 – 2020-09-12 (×8): 1 mg via INTRAVENOUS
  Filled 2020-09-08 (×8): qty 1

## 2020-09-08 MED ORDER — ROCURONIUM BROMIDE 10 MG/ML (PF) SYRINGE
PREFILLED_SYRINGE | INTRAVENOUS | Status: AC
  Filled 2020-09-08: qty 10

## 2020-09-08 MED ORDER — PROCHLORPERAZINE MALEATE 10 MG PO TABS
10.0000 mg | ORAL_TABLET | Freq: Four times a day (QID) | ORAL | Status: DC | PRN
  Filled 2020-09-08: qty 1

## 2020-09-08 MED ORDER — MELATONIN 3 MG PO TABS: 3.0000 mg | ORAL_TABLET | Freq: Every evening | ORAL | Status: DC | PRN

## 2020-09-08 MED ORDER — TRAMADOL HCL 50 MG PO TABS: 50.0000 mg | ORAL_TABLET | Freq: Four times a day (QID) | ORAL | 0 refills | Status: DC | PRN

## 2020-09-08 MED ORDER — ONDANSETRON HCL 4 MG PO TABS
4.0000 mg | ORAL_TABLET | Freq: Four times a day (QID) | ORAL | Status: DC | PRN
  Administered 2020-09-09 – 2020-09-12 (×3): 4 mg via ORAL
  Filled 2020-09-08 (×3): qty 1

## 2020-09-08 MED ORDER — BUPIVACAINE-EPINEPHRINE (PF) 0.25% -1:200000 IJ SOLN
INTRAMUSCULAR | Status: DC | PRN
  Administered 2020-09-08: 60 mL via PERINEURAL

## 2020-09-08 MED ORDER — ACETAMINOPHEN 500 MG PO TABS
1000.0000 mg | ORAL_TABLET | Freq: Four times a day (QID) | ORAL | Status: DC
  Administered 2020-09-08 – 2020-09-13 (×6): 1000 mg via ORAL
  Filled 2020-09-08 (×8): qty 2

## 2020-09-08 MED ORDER — CHLORHEXIDINE GLUCONATE CLOTH 2 % EX PADS: 6.0000 | MEDICATED_PAD | Freq: Once | CUTANEOUS | Status: DC

## 2020-09-08 MED ORDER — SODIUM CHLORIDE 0.9% FLUSH: 3.0000 mL | INTRAVENOUS | Status: DC | PRN

## 2020-09-08 MED ORDER — SUGAMMADEX SODIUM 200 MG/2ML IV SOLN
INTRAVENOUS | Status: DC | PRN
  Administered 2020-09-08: 250 mg via INTRAVENOUS

## 2020-09-08 MED ORDER — SODIUM CHLORIDE 0.9 % IR SOLN
Status: DC | PRN
  Administered 2020-09-08: 1000 mL

## 2020-09-08 MED ORDER — SODIUM CHLORIDE 0.9 % IV SOLN
2.0000 g | Freq: Two times a day (BID) | INTRAVENOUS | Status: AC
  Administered 2020-09-09: 2 g via INTRAVENOUS
  Filled 2020-09-08: qty 2

## 2020-09-08 MED ORDER — MAGIC MOUTHWASH
15.0000 mL | Freq: Four times a day (QID) | ORAL | Status: DC | PRN
  Filled 2020-09-08: qty 15

## 2020-09-08 MED ORDER — LACTATED RINGERS IV SOLN: INTRAVENOUS | Status: DC | PRN

## 2020-09-08 MED ORDER — FENTANYL CITRATE (PF) 100 MCG/2ML IJ SOLN: 25.0000 ug | INTRAMUSCULAR | Status: DC | PRN

## 2020-09-08 MED ORDER — METHOCARBAMOL 500 MG PO TABS: 500.0000 mg | ORAL_TABLET | Freq: Three times a day (TID) | ORAL | 1 refills | Status: DC | PRN

## 2020-09-08 MED ORDER — DIPHENHYDRAMINE HCL 12.5 MG/5ML PO ELIX
12.5000 mg | ORAL_SOLUTION | Freq: Four times a day (QID) | ORAL | Status: DC | PRN
Start: 2020-09-08 — End: 2020-09-13

## 2020-09-08 MED ORDER — METOPROLOL TARTRATE 5 MG/5ML IV SOLN: 5.0000 mg | Freq: Four times a day (QID) | INTRAVENOUS | Status: DC | PRN

## 2020-09-08 MED ORDER — OXYCODONE HCL 5 MG/5ML PO SOLN: 5.0000 mg | Freq: Once | ORAL | Status: DC | PRN

## 2020-09-08 MED ORDER — PHENYLEPHRINE HCL-NACL 20-0.9 MG/250ML-% IV SOLN
INTRAVENOUS | Status: DC | PRN
  Administered 2020-09-08: 20 ug/min via INTRAVENOUS

## 2020-09-08 MED ORDER — ORAL CARE MOUTH RINSE: 15.0000 mL | Freq: Once | OROMUCOSAL | Status: AC

## 2020-09-08 MED ORDER — ALPRAZOLAM 0.5 MG PO TABS
0.5000 mg | ORAL_TABLET | Freq: Three times a day (TID) | ORAL | Status: DC | PRN
  Administered 2020-09-09 – 2020-09-12 (×5): 0.5 mg via ORAL
  Filled 2020-09-08 (×5): qty 1

## 2020-09-08 MED ORDER — DIPHENHYDRAMINE HCL 50 MG/ML IJ SOLN: 12.5000 mg | Freq: Four times a day (QID) | INTRAMUSCULAR | Status: DC | PRN

## 2020-09-08 MED ORDER — ALVIMOPAN 12 MG PO CAPS
12.0000 mg | ORAL_CAPSULE | ORAL | Status: AC
  Administered 2020-09-08: 12 mg via ORAL
  Filled 2020-09-08: qty 1

## 2020-09-08 MED ORDER — LOSARTAN POTASSIUM-HCTZ 50-12.5 MG PO TABS: 1.0000 | ORAL_TABLET | Freq: Every day | ORAL | Status: DC

## 2020-09-08 MED ORDER — 0.9 % SODIUM CHLORIDE (POUR BTL) OPTIME
TOPICAL | Status: DC | PRN
  Administered 2020-09-08: 1000 mL

## 2020-09-08 MED ORDER — SODIUM CHLORIDE 0.9 % IV SOLN
2.0000 g | INTRAVENOUS | Status: AC
  Administered 2020-09-08: 2 g via INTRAVENOUS
  Filled 2020-09-08: qty 2

## 2020-09-08 MED ORDER — BISACODYL 5 MG PO TBEC: 20.0000 mg | DELAYED_RELEASE_TABLET | Freq: Once | ORAL | Status: DC

## 2020-09-08 MED ORDER — SUGAMMADEX SODIUM 500 MG/5ML IV SOLN
INTRAVENOUS | Status: AC
  Filled 2020-09-08: qty 5

## 2020-09-08 MED ORDER — ONDANSETRON HCL 4 MG/2ML IJ SOLN
INTRAMUSCULAR | Status: AC
  Filled 2020-09-08: qty 2

## 2020-09-08 MED ORDER — LIP MEDEX EX OINT
1.0000 "application " | TOPICAL_OINTMENT | Freq: Two times a day (BID) | CUTANEOUS | Status: DC
  Administered 2020-09-08 – 2020-09-13 (×10): 1 via TOPICAL
  Filled 2020-09-08 (×4): qty 7

## 2020-09-08 MED ORDER — ADULT MULTIVITAMIN W/MINERALS CH
1.0000 | ORAL_TABLET | ORAL | Status: DC
  Administered 2020-09-09 – 2020-09-13 (×2): 1 via ORAL
  Filled 2020-09-08: qty 1

## 2020-09-08 MED ORDER — LIDOCAINE HCL (PF) 2 % IJ SOLN
INTRAMUSCULAR | Status: AC
  Filled 2020-09-08: qty 5

## 2020-09-08 MED ORDER — FENTANYL CITRATE (PF) 250 MCG/5ML IJ SOLN
INTRAMUSCULAR | Status: AC
  Filled 2020-09-08: qty 5

## 2020-09-08 MED ORDER — LOSARTAN POTASSIUM 50 MG PO TABS
50.0000 mg | ORAL_TABLET | Freq: Every day | ORAL | Status: DC
  Administered 2020-09-08 – 2020-09-13 (×6): 50 mg via ORAL
  Filled 2020-09-08 (×6): qty 1

## 2020-09-08 MED ORDER — PHENYLEPHRINE HCL (PRESSORS) 10 MG/ML IV SOLN
INTRAVENOUS | Status: AC
  Filled 2020-09-08: qty 2

## 2020-09-08 MED ORDER — SODIUM CHLORIDE 0.9% FLUSH
3.0000 mL | Freq: Two times a day (BID) | INTRAVENOUS | Status: DC
  Administered 2020-09-09 – 2020-09-10 (×3): 3 mL via INTRAVENOUS

## 2020-09-08 MED ORDER — MIDAZOLAM HCL 5 MG/5ML IJ SOLN
INTRAMUSCULAR | Status: DC | PRN
  Administered 2020-09-08: 2 mg via INTRAVENOUS

## 2020-09-08 MED ORDER — ALVIMOPAN 12 MG PO CAPS
12.0000 mg | ORAL_CAPSULE | Freq: Two times a day (BID) | ORAL | Status: DC
  Administered 2020-09-09 – 2020-09-11 (×5): 12 mg via ORAL
  Filled 2020-09-08 (×5): qty 1

## 2020-09-08 MED ORDER — CALCIUM POLYCARBOPHIL 625 MG PO TABS
625.0000 mg | ORAL_TABLET | Freq: Two times a day (BID) | ORAL | Status: DC
  Administered 2020-09-08 – 2020-09-13 (×10): 625 mg via ORAL
  Filled 2020-09-08 (×11): qty 1

## 2020-09-08 MED ORDER — PROCHLORPERAZINE EDISYLATE 10 MG/2ML IJ SOLN: 5.0000 mg | Freq: Four times a day (QID) | INTRAMUSCULAR | Status: DC | PRN

## 2020-09-08 MED ORDER — PROPOFOL 10 MG/ML IV BOLUS
INTRAVENOUS | Status: DC | PRN
  Administered 2020-09-08: 20 mg via INTRAVENOUS
  Administered 2020-09-08: 180 mg via INTRAVENOUS

## 2020-09-08 MED ORDER — DEXAMETHASONE SODIUM PHOSPHATE 10 MG/ML IJ SOLN
INTRAMUSCULAR | Status: AC
  Filled 2020-09-08: qty 1

## 2020-09-08 MED ORDER — ENOXAPARIN SODIUM 40 MG/0.4ML IJ SOSY
40.0000 mg | PREFILLED_SYRINGE | Freq: Once | INTRAMUSCULAR | Status: AC
  Administered 2020-09-08: 40 mg via SUBCUTANEOUS
  Filled 2020-09-08: qty 0.4

## 2020-09-08 MED ORDER — BUPIVACAINE LIPOSOME 1.3 % IJ SUSP
INTRAMUSCULAR | Status: DC | PRN
  Administered 2020-09-08: 20 mL

## 2020-09-08 MED ORDER — SIMETHICONE 80 MG PO CHEW: 40.0000 mg | CHEWABLE_TABLET | Freq: Four times a day (QID) | ORAL | Status: DC | PRN

## 2020-09-08 MED ORDER — METRONIDAZOLE 500 MG PO TABS: 1000.0000 mg | ORAL_TABLET | ORAL | Status: DC

## 2020-09-08 MED ORDER — ENOXAPARIN SODIUM 40 MG/0.4ML IJ SOSY
40.0000 mg | PREFILLED_SYRINGE | INTRAMUSCULAR | Status: DC
  Administered 2020-09-09 – 2020-09-13 (×5): 40 mg via SUBCUTANEOUS
  Filled 2020-09-08 (×5): qty 0.4

## 2020-09-08 MED ORDER — METHOCARBAMOL 1000 MG/10ML IJ SOLN
1000.0000 mg | Freq: Four times a day (QID) | INTRAVENOUS | Status: DC | PRN
  Filled 2020-09-08: qty 10

## 2020-09-08 MED ORDER — ARTIFICIAL TEARS OPHTHALMIC OINT
TOPICAL_OINTMENT | OPHTHALMIC | Status: AC
  Filled 2020-09-08: qty 3.5

## 2020-09-08 MED ORDER — PROPOFOL 10 MG/ML IV BOLUS
INTRAVENOUS | Status: AC
  Filled 2020-09-08: qty 20

## 2020-09-08 MED ORDER — PROMETHAZINE HCL 25 MG/ML IJ SOLN: 6.2500 mg | INTRAMUSCULAR | Status: DC | PRN

## 2020-09-08 MED ORDER — PHENYLEPHRINE 40 MCG/ML (10ML) SYRINGE FOR IV PUSH (FOR BLOOD PRESSURE SUPPORT)
PREFILLED_SYRINGE | INTRAVENOUS | Status: DC | PRN
  Administered 2020-09-08: 80 ug via INTRAVENOUS

## 2020-09-08 MED ORDER — 0.9 % SODIUM CHLORIDE (POUR BTL) OPTIME
TOPICAL | Status: DC | PRN
  Administered 2020-09-08: 2000 mL

## 2020-09-08 MED ORDER — METHOCARBAMOL 500 MG PO TABS
1000.0000 mg | ORAL_TABLET | Freq: Four times a day (QID) | ORAL | Status: DC | PRN
  Administered 2020-09-08 – 2020-09-10 (×3): 1000 mg via ORAL
  Filled 2020-09-08 (×3): qty 2

## 2020-09-08 MED ORDER — ENSURE SURGERY PO LIQD
237.0000 mL | Freq: Two times a day (BID) | ORAL | Status: DC
  Administered 2020-09-09 (×2): 237 mL via ORAL

## 2020-09-08 MED ORDER — SODIUM CHLORIDE 0.9 % IV SOLN: 250.0000 mL | INTRAVENOUS | Status: DC | PRN

## 2020-09-08 MED ORDER — NEOMYCIN SULFATE 500 MG PO TABS
1000.0000 mg | ORAL_TABLET | ORAL | Status: DC
Start: 2020-09-08 — End: 2020-09-08

## 2020-09-08 SURGICAL SUPPLY — 118 items
APPLIER CLIP 5 13 M/L LIGAMAX5 (MISCELLANEOUS)
APPLIER CLIP ROT 10 11.4 M/L (STAPLE)
BAG URO CATCHER STRL LF (MISCELLANEOUS) ×2 IMPLANT
BLADE EXTENDED COATED 6.5IN (ELECTRODE) IMPLANT
CANNULA REDUC XI 12-8 STAPL (CANNULA)
CANNULA REDUCER 12-8 DVNC XI (CANNULA) IMPLANT
CATH URET 5FR 28IN OPEN ENDED (CATHETERS) ×2 IMPLANT
CELLS DAT CNTRL 66122 CELL SVR (MISCELLANEOUS) IMPLANT
CHLORAPREP W/TINT 26 (MISCELLANEOUS) IMPLANT
CLIP APPLIE 5 13 M/L LIGAMAX5 (MISCELLANEOUS) IMPLANT
CLIP APPLIE ROT 10 11.4 M/L (STAPLE) IMPLANT
CLOTH BEACON ORANGE TIMEOUT ST (SAFETY) ×2 IMPLANT
COVER SURGICAL LIGHT HANDLE (MISCELLANEOUS) ×4 IMPLANT
COVER TIP SHEARS 8 DVNC (MISCELLANEOUS) ×1 IMPLANT
COVER TIP SHEARS 8MM DA VINCI (MISCELLANEOUS) ×2
COVER WAND RF STERILE (DRAPES) ×2 IMPLANT
DECANTER SPIKE VIAL GLASS SM (MISCELLANEOUS) ×2 IMPLANT
DEVICE TROCAR PUNCTURE CLOSURE (ENDOMECHANICALS) IMPLANT
DRAIN CHANNEL 19F RND (DRAIN) IMPLANT
DRAPE ARM DVNC X/XI (DISPOSABLE) ×4 IMPLANT
DRAPE COLUMN DVNC XI (DISPOSABLE) ×1 IMPLANT
DRAPE DA VINCI XI ARM (DISPOSABLE) ×8
DRAPE DA VINCI XI COLUMN (DISPOSABLE) ×2
DRAPE SURG IRRIG POUCH 19X23 (DRAPES) ×2 IMPLANT
DRSG OPSITE POSTOP 4X10 (GAUZE/BANDAGES/DRESSINGS) IMPLANT
DRSG OPSITE POSTOP 4X6 (GAUZE/BANDAGES/DRESSINGS) ×1 IMPLANT
DRSG OPSITE POSTOP 4X8 (GAUZE/BANDAGES/DRESSINGS) IMPLANT
DRSG TEGADERM 2-3/8X2-3/4 SM (GAUZE/BANDAGES/DRESSINGS) ×6 IMPLANT
DRSG TEGADERM 4X4.75 (GAUZE/BANDAGES/DRESSINGS) IMPLANT
ELECT PENCIL ROCKER SW 15FT (MISCELLANEOUS) ×2 IMPLANT
ELECT REM PT RETURN 15FT ADLT (MISCELLANEOUS) ×2 IMPLANT
ENDOLOOP SUT PDS II  0 18 (SUTURE)
ENDOLOOP SUT PDS II 0 18 (SUTURE) IMPLANT
EVACUATOR SILICONE 100CC (DRAIN) IMPLANT
GAUZE SPONGE 2X2 8PLY STRL LF (GAUZE/BANDAGES/DRESSINGS) ×1 IMPLANT
GLOVE SURG ENC TEXT LTX SZ7.5 (GLOVE) ×2 IMPLANT
GLOVE SURG LTX SZ8 (GLOVE) ×6 IMPLANT
GLOVE SURG UNDER LTX SZ8 (GLOVE) ×6 IMPLANT
GOWN STRL REUS W/TWL XL LVL3 (GOWN DISPOSABLE) ×8 IMPLANT
GRASPER SUT TROCAR 14GX15 (MISCELLANEOUS) IMPLANT
GUIDEWIRE STR DUAL SENSOR (WIRE) IMPLANT
GUIDEWIRE ZIPWRE .038 STRAIGHT (WIRE) ×2 IMPLANT
HOLDER FOLEY CATH W/STRAP (MISCELLANEOUS) ×2 IMPLANT
IRRIG SUCT STRYKERFLOW 2 WTIP (MISCELLANEOUS) ×2
IRRIGATION SUCT STRKRFLW 2 WTP (MISCELLANEOUS) ×1 IMPLANT
KIT PROCEDURE DA VINCI SI (MISCELLANEOUS)
KIT PROCEDURE DVNC SI (MISCELLANEOUS) IMPLANT
KIT SIGMOIDOSCOPE (SET/KITS/TRAYS/PACK) IMPLANT
KIT TURNOVER KIT A (KITS) ×4 IMPLANT
MANIFOLD NEPTUNE II (INSTRUMENTS) ×2 IMPLANT
NDL INSUFFLATION 14GA 120MM (NEEDLE) ×1 IMPLANT
NEEDLE INSUFFLATION 14GA 120MM (NEEDLE) ×2 IMPLANT
PACK CARDIOVASCULAR III (CUSTOM PROCEDURE TRAY) ×2 IMPLANT
PACK COLON (CUSTOM PROCEDURE TRAY) ×2 IMPLANT
PACK CYSTO (CUSTOM PROCEDURE TRAY) ×2 IMPLANT
PAD POSITIONING PINK XL (MISCELLANEOUS) ×2 IMPLANT
PORT LAP GEL ALEXIS MED 5-9CM (MISCELLANEOUS) ×2 IMPLANT
PROTECTOR NERVE ULNAR (MISCELLANEOUS) ×4 IMPLANT
RELOAD STAPLE 45 3.5 BLU DVNC (STAPLE) IMPLANT
RELOAD STAPLE 45 4.3 GRN DVNC (STAPLE) IMPLANT
RELOAD STAPLE 60 3.5 BLU DVNC (STAPLE) IMPLANT
RELOAD STAPLE 60 4.3 GRN DVNC (STAPLE) IMPLANT
RELOAD STAPLER 3.5X45 BLU DVNC (STAPLE) IMPLANT
RELOAD STAPLER 3.5X60 BLU DVNC (STAPLE) IMPLANT
RELOAD STAPLER 4.3X45 GRN DVNC (STAPLE) IMPLANT
RELOAD STAPLER 4.3X60 GRN DVNC (STAPLE) IMPLANT
RETRACTOR WND ALEXIS 18 MED (MISCELLANEOUS) IMPLANT
RTRCTR WOUND ALEXIS 18CM MED (MISCELLANEOUS)
SCISSORS LAP 5X35 DISP (ENDOMECHANICALS) ×2 IMPLANT
SEAL CANN UNIV 5-8 DVNC XI (MISCELLANEOUS) ×3 IMPLANT
SEAL XI 5MM-8MM UNIVERSAL (MISCELLANEOUS) ×6
SEALER VESSEL DA VINCI XI (MISCELLANEOUS) ×2
SEALER VESSEL EXT DVNC XI (MISCELLANEOUS) ×1 IMPLANT
SOLUTION ELECTROLUBE (MISCELLANEOUS) ×2 IMPLANT
SPONGE GAUZE 2X2 STER 10/PKG (GAUZE/BANDAGES/DRESSINGS) ×1
STAPLER 45 DA VINCI SURE FORM (STAPLE)
STAPLER 45 SUREFORM DVNC (STAPLE) IMPLANT
STAPLER 60 DA VINCI SURE FORM (STAPLE)
STAPLER 60 SUREFORM DVNC (STAPLE) IMPLANT
STAPLER CANNULA SEAL DVNC XI (STAPLE) ×1 IMPLANT
STAPLER CANNULA SEAL XI (STAPLE) ×2
STAPLER ECHELON POWER CIR 29 (STAPLE) IMPLANT
STAPLER ECHELON POWER CIR 31 (STAPLE) ×1 IMPLANT
STAPLER RELOAD 3.5X45 BLU DVNC (STAPLE)
STAPLER RELOAD 3.5X45 BLUE (STAPLE)
STAPLER RELOAD 3.5X60 BLU DVNC (STAPLE)
STAPLER RELOAD 3.5X60 BLUE (STAPLE)
STAPLER RELOAD 4.3X45 GREEN (STAPLE)
STAPLER RELOAD 4.3X45 GRN DVNC (STAPLE)
STAPLER RELOAD 4.3X60 GREEN (STAPLE)
STAPLER RELOAD 4.3X60 GRN DVNC (STAPLE)
STOPCOCK 4 WAY LG BORE MALE ST (IV SETS) ×4 IMPLANT
SURGILUBE 2OZ TUBE FLIPTOP (MISCELLANEOUS) IMPLANT
SUT MNCRL AB 4-0 PS2 18 (SUTURE) ×2 IMPLANT
SUT PDS AB 1 CT1 27 (SUTURE) ×6 IMPLANT
SUT PROLENE 0 CT 2 (SUTURE) IMPLANT
SUT PROLENE 2 0 KS (SUTURE) IMPLANT
SUT PROLENE 2 0 SH DA (SUTURE) IMPLANT
SUT SILK 2 0 (SUTURE)
SUT SILK 2 0 SH CR/8 (SUTURE) IMPLANT
SUT SILK 2-0 18XBRD TIE 12 (SUTURE) IMPLANT
SUT SILK 3 0 (SUTURE)
SUT SILK 3 0 SH CR/8 (SUTURE) ×2 IMPLANT
SUT SILK 3-0 18XBRD TIE 12 (SUTURE) IMPLANT
SUT V-LOC BARB 180 2/0GR6 GS22 (SUTURE)
SUT VIC AB 3-0 SH 18 (SUTURE) IMPLANT
SUT VIC AB 3-0 SH 27 (SUTURE)
SUT VIC AB 3-0 SH 27XBRD (SUTURE) IMPLANT
SUT VICRYL 0 UR6 27IN ABS (SUTURE) ×2 IMPLANT
SUTURE V-LC BRB 180 2/0GR6GS22 (SUTURE) IMPLANT
SYR 10ML ECCENTRIC (SYRINGE) ×2 IMPLANT
SYS LAPSCP GELPORT 120MM (MISCELLANEOUS)
SYSTEM LAPSCP GELPORT 120MM (MISCELLANEOUS) IMPLANT
TOWEL OR NON WOVEN STRL DISP B (DISPOSABLE) ×2 IMPLANT
TRAY FOLEY MTR SLVR 16FR STAT (SET/KITS/TRAYS/PACK) ×2 IMPLANT
TROCAR ADV FIXATION 5X100MM (TROCAR) ×2 IMPLANT
TUBING CONNECTING 10 (TUBING) ×6 IMPLANT
TUBING INSUFFLATION 10FT LAP (TUBING) ×2 IMPLANT

## 2020-09-08 NOTE — Anesthesia Postprocedure Evaluation (Signed)
Anesthesia Post Note  Patient: [name]  Procedure(s) Performed: XI ROBOTIC ASSISTED OSTOMY TAKEDOWN;TAP BLOCKS, PRIMARY INCISIONAL PERITONEAL HERNIA REPAIR (N/A ) LYSIS OF ADHESION (N/A ) RIGID PROCTOSCOPY (N/A ) CYSTOSCOPY with FIREFLY INJECTION (N/A )     Patient location during evaluation: PACU Anesthesia Type: General Level of consciousness: awake and alert Pain management: pain level controlled Vital Signs Assessment: post-procedure vital signs reviewed and stable Respiratory status: spontaneous breathing, nonlabored ventilation and respiratory function stable Cardiovascular status: blood pressure returned to baseline and stable Postop Assessment: no apparent nausea or vomiting Anesthetic complications: no   No complications documented.  Last Vitals:  Vitals:   09/08/20 0947 09/08/20 1617  BP: (!) 145/87 (!) 154/96  Pulse: 92 (!) 108  Resp: 17 18  Temp: 36.9 C 36.8 C  SpO2: 99% 100%    Last Pain:  Vitals:   09/08/20 1630  TempSrc:   PainSc: 0-No pain                 Mellody Dance

## 2020-09-08 NOTE — Interval H&P Note (Signed)
History and Physical Interval Note:  09/08/2020 12:12 PM  [name]  has presented today for surgery, with the diagnosis of COLOSTOMY FOR COLON RESECTION, DESIRE FOR OSTOMY TAKEDOWN.  The various methods of treatment have been discussed with the patient and family. After consideration of risks, benefits and other options for treatment, the patient has consented to  Procedure(s): XI ROBOTIC ASSISTED OSTOMY TAKEDOWN (N/A) LYSIS OF ADHESION (N/A) RIGID PROCTOSCOPY (N/A) CYSTOSCOPY with FIREFLY INJECTION (N/A) as a surgical intervention.  The patient's history has been reviewed, patient examined, no change in status, stable for surgery.  I have reviewed the patient's chart and labs.  Questions were answered to the patient's satisfaction.    The anatomy & physiology of the digestive tract was discussed.  The pathophysiology was discussed.  Possibility of remaining with an ostomy permanently was discussed.  I offered ostomy takedown.  Laparoscopic & open techniques were discussed.   Risks such as bleeding, infection, abscess, leak, reoperation, possible re-ostomy, injury to other organs, need for repair of tissues / organs, need for further treatment, hernia, heart attack, death, and other risks were discussed.   I noted a good likelihood this will help address the problem.  Goals of post-operative recovery were discussed as well.  We will work to minimize complications.  Questions were answered.  The patient expresses understanding & wishes to proceed with surgery.   I have re-reviewed the the patient's records, history, medications, and allergies.  I have re-examined the patient.  I again discussed intraoperative plans and goals of post-operative recovery.  The patient agrees to proceed.  [name]  [birth year] [id number]  Patient Care Team: Soundra Pilon, FNP as PCP - General (Family Medicine) Karie Soda, MD as Consulting Physician (Colon and Rectal Surgery) Nada Libman, MD as  Consulting Physician (Vascular Surgery) Kerin Salen, MD as Consulting Physician (Gastroenterology)  Patient Active Problem List   Diagnosis Date Noted   Body mass index (BMI) 40.0-44.9, adult (HCC) 05/18/2020   Acquired thrombophilia (HCC) 05/18/2020   Colostomy present (HCC) 05/18/2020   Chronic pain 05/18/2020   Diverticular disease of colon 05/18/2020   Esophageal reflux 05/18/2020   Panic disorder 05/18/2020   Mixed anxiety and depressive disorder 05/18/2020   Irritable bowel syndrome 05/18/2020   Mixed hyperlipidemia 05/18/2020   Protein-calorie malnutrition, severe 11/27/2019   Diverticulitis of large intestine with abscess 11/14/2019   Hypertension 11/14/2019   Anemia, chronic disease 11/14/2019   Severe sepsis (HCC) 11/06/2019   Diverticulitis 11/02/2019   Intra-abdominal abscess (HCC) 11/02/2019    Past Medical History:  Diagnosis Date   Anxiety    Arthritis    Diverticulitis    DVT (deep venous thrombosis) (HCC)    Hypertension    Pre-diabetes    Refusal of blood product     Past Surgical History:  Procedure Laterality Date   ABDOMINAL HYSTERECTOMY     BOWEL RESECTION N/A 11/25/2019   Procedure: SMALL BOWEL RESECTION;  Surgeon: Almond Lint, MD;  Location: [facility] OR;  Service: General;  Laterality: N/A;   COLECTOMY N/A 11/25/2019   Procedure: SIGMOID COLECTOMY;  Surgeon: Almond Lint, MD;  Location: [facility] OR;  Service: General;  Laterality: N/A;   COLOSTOMY N/A 11/25/2019   Procedure: CREATION OF COLOSTOMY;  Surgeon: Almond Lint, MD;  Location: [facility];  Service: General;  Laterality: N/A;   INCISION AND DRAINAGE ABSCESS N/A 11/25/2019   Procedure: INCISION AND DRAINAGE ABDOMINAL ABSCESS;  Surgeon: Almond Lint, MD;  Location: [facility];  Service: General;  Laterality: N/A;   IR IVC FILTER PLMT / S&I /IMG GUID/MOD SED  12/03/2019   LAPAROTOMY N/A 11/25/2019   Procedure: EXPLORATORY LAPAROTOMY;  Surgeon: Almond Lint, MD;  Location: [facility];  Service: General;  Laterality:  N/A;    Social History   Socioeconomic History   Marital status: Single    Spouse name: Not on file   Number of children: Not on file   Years of education: Not on file   Highest education level: Not on file  Occupational History   Not on file  Tobacco Use   Smoking status: Never Smoker   Smokeless tobacco: Never Used  Vaping Use   Vaping Use: Never used  Substance and Sexual Activity   Alcohol use: No   Drug use: No   Sexual activity: Not on file  Other Topics Concern   Not on file  Social History Narrative   Not on file   Social Determinants of Health   Financial Resource Strain: Not on file  Food Insecurity: Not on file  Transportation Needs: Not on file  Physical Activity: Not on file  Stress: Not on file  Social Connections: Not on file  Intimate Partner Violence: Not on file    Family History  Problem Relation Age of Onset   Hypertension Mother    Diabetes Mother    Cancer Father     Medications Prior to Admission  Medication Sig Dispense Refill Last Dose   ALPRAZolam (XANAX) 0.5 MG tablet Take 0.5 mg by mouth 3 (three) times daily as needed for anxiety.   Past Month at Unknown time   losartan-hydrochlorothiazide (HYZAAR) 50-12.5 MG per tablet Take 1 tablet by mouth daily.   09/07/2020 at Unknown time   methocarbamol (ROBAXIN) 500 MG tablet Take 1 tablet (500 mg total) by mouth every 6 (six) hours as needed for muscle spasms. (Patient taking differently: Take 500 mg by mouth every 8 (eight) hours as needed for muscle spasms.) 60 tablet 0 Past Week at Unknown time   Multiple Vitamin (MULTIVITAMIN WITH MINERALS) TABS tablet Take 1 tablet by mouth 2 (two) times a week.   Past Week at Unknown time   polyethylene glycol (MIRALAX / GLYCOLAX) 17 g packet Take 17 g by mouth daily as needed for mild constipation. 14 each 0 Past Week at Unknown time   promethazine (PHENERGAN) 25 MG tablet Take 25 mg by mouth every 12 (twelve) hours as needed for nausea or vomiting.   Past  Week at Unknown time   XARELTO 20 MG TABS tablet TAKE 1 TABLET BY MOUTH DAILY WITH SUPPER. 30 tablet 3 Past Month at Unknown time   ferrous sulfate 325 (65 FE) MG tablet Take 1 tablet (325 mg total) by mouth 2 (two) times daily with a meal. 60 tablet 0    saccharomyces boulardii (FLORASTOR) 250 MG capsule Take 1 capsule (250 mg total) by mouth 2 (two) times daily. (Patient not taking: Reported on 08/24/2020)   Not Taking at Unknown time    Current Facility-Administered Medications  Medication Dose Route Frequency Provider Last Rate Last Admin   bupivacaine liposome (EXPAREL) 1.3 % injection 266 mg  20 mL Infiltration On Call to OR Karie Soda, MD       cefoTEtan (CEFOTAN) 2 g in sodium chloride 0.9 % 100 mL IVPB  2 g Intravenous On Call to OR Karie Soda, MD       Chlorhexidine Gluconate Cloth 2 % PADS 6 each  6 each Topical Once Cederic Mozley,  Viviann Spare, MD       And   Chlorhexidine Gluconate Cloth 2 % PADS 6 each  6 each Topical Once Karie Soda, MD       lactated ringers infusion   Intravenous Continuous Marcene Duos, MD 10 mL/hr at 09/08/20 0949 New Bag at 09/08/20 0949     Allergies  Allergen Reactions   Other     Blood refusal   Aspirin Diarrhea    stomach ache.     BP (!) 145/87   Pulse 92   Temp 98.5 F (36.9 C) (Oral)   Resp 17   Wt 116.7 kg   SpO2 99%   BMI 41.54 kg/m   Labs: Results for orders placed or performed during the hospital encounter of 09/06/20 (from the past 48 hour(s))  SARS CORONAVIRUS 2 (TAT 6-24 HRS) Nasopharyngeal Nasopharyngeal Swab     Status: None   Collection Time: 09/06/20  2:05 PM   Specimen: Nasopharyngeal Swab  Result Value Ref Range   SARS Coronavirus 2 NEGATIVE NEGATIVE    Comment: (NOTE) SARS-CoV-2 target nucleic acids are NOT DETECTED.  The SARS-CoV-2 RNA is generally detectable in upper and lower respiratory specimens during the acute phase of infection. Negative results do not preclude SARS-CoV-2 infection, do not rule  out co-infections with other pathogens, and should not be used as the sole basis for treatment or other patient management decisions. Negative results must be combined with clinical observations, patient history, and epidemiological information. The expected result is Negative.  Fact Sheet for Patients: HairSlick.no  Fact Sheet for Healthcare Providers: quierodirigir.com  This test is not yet approved or cleared by the [place] FDA and  has been authorized for detection and/or diagnosis of SARS-CoV-2 by FDA under an Emergency Use Authorization (EUA). This EUA will remain  in effect (meaning this test can be used) for the duration of the COVID-19 declaration under Se ction 564(b)(1) of the Act, 21 U.S.C. section 360bbb-3(b)(1), unless the authorization is terminated or revoked sooner.  Performed at [facility], [address], [place], [place] [address]     Imaging / Studies: No results found.   Ardeth Sportsman, M.D., F.A.C.S. Gastrointestinal and Minimally Invasive Surgery [facility] [address], [address] [place], [place] [address] [phone] Main / Paging  09/08/2020 12:12 PM    Ardeth Sportsman

## 2020-09-08 NOTE — Transfer of Care (Signed)
Immediate Anesthesia Transfer of Care Note  Patient: [name]  Procedure(s) Performed: XI ROBOTIC ASSISTED OSTOMY TAKEDOWN;TAP BLOCKS, PRIMARY INCISIONAL PERITONEAL HERNIA REPAIR (N/A ) LYSIS OF ADHESION (N/A ) RIGID PROCTOSCOPY (N/A ) CYSTOSCOPY with FIREFLY INJECTION (N/A )  Patient Location: PACU  Anesthesia Type:General  Level of Consciousness: awake and alert   Airway & Oxygen Therapy: Patient Spontanous Breathing and Patient connected to face mask oxygen  Post-op Assessment: Report given to RN and Post -op Vital signs reviewed and stable  Post vital signs: Reviewed and stable  Last Vitals:  Vitals Value Taken Time  BP 154/96 09/08/20 1616  Temp    Pulse 109 09/08/20 1618  Resp 17 09/08/20 1618  SpO2 100 % 09/08/20 1618  Vitals shown include unvalidated device data.  Last Pain:  Vitals:   09/08/20 0947  TempSrc: Oral  PainSc: 0-No pain         Complications: No complications documented.

## 2020-09-08 NOTE — Interval H&P Note (Signed)
History and Physical Interval Note:  09/08/2020 12:10 PM  [name]  has presented today for surgery, with the diagnosis of COLOSTOMY FOR COLON RESECTION, DESIRE FOR OSTOMY TAKEDOWN.  The various methods of treatment have been discussed with the patient and family. After consideration of risks, benefits and other options for treatment, the patient has consented to  Procedure(s): XI ROBOTIC ASSISTED OSTOMY TAKEDOWN (N/A) LYSIS OF ADHESION (N/A) RIGID PROCTOSCOPY (N/A) CYSTOSCOPY with FIREFLY INJECTION (N/A) as a surgical intervention.  The patient's history has been reviewed, patient examined, no change in status, stable for surgery.  I have reviewed the patient's chart and labs.  Questions were answered to the patient's satisfaction.    I have re-reviewed the the patient's records, history, medications, and allergies.  I have re-examined the patient.  I again discussed intraoperative plans and goals of post-operative recovery.  The patient agrees to proceed.  [name]  1966-02-12 [id number]  Patient Care Team: Gabrielle Pilon, FNP as PCP - General (Family Medicine) Gabrielle Soda, MD as Consulting Physician (Colon and Rectal Surgery) Gabrielle Libman, MD as Consulting Physician (Vascular Surgery) Gabrielle Salen, MD as Consulting Physician (Gastroenterology)  Patient Active Problem List   Diagnosis Date Noted   Body mass index (BMI) 40.0-44.9, adult (HCC) 05/18/2020   Acquired thrombophilia (HCC) 05/18/2020   Colostomy present (HCC) 05/18/2020   Chronic pain 05/18/2020   Diverticular disease of colon 05/18/2020   Esophageal reflux 05/18/2020   Panic disorder 05/18/2020   Mixed anxiety and depressive disorder 05/18/2020   Irritable bowel syndrome 05/18/2020   Mixed hyperlipidemia 05/18/2020   Protein-calorie malnutrition, severe 11/27/2019   Diverticulitis of large intestine with abscess 11/14/2019   Hypertension 11/14/2019   Anemia, chronic disease 11/14/2019   Severe sepsis  (HCC) 11/06/2019   Diverticulitis 11/02/2019   Intra-abdominal abscess (HCC) 11/02/2019    Past Medical History:  Diagnosis Date   Anxiety    Arthritis    Diverticulitis    DVT (deep venous thrombosis) (HCC)    Hypertension    Pre-diabetes    Refusal of blood product     Past Surgical History:  Procedure Laterality Date   ABDOMINAL HYSTERECTOMY     BOWEL RESECTION N/A 11/25/2019   Procedure: SMALL BOWEL RESECTION;  Surgeon: Gabrielle Lint, MD;  Location: [facility];  Service: General;  Laterality: N/A;   COLECTOMY N/A 11/25/2019   Procedure: SIGMOID COLECTOMY;  Surgeon: Gabrielle Lint, MD;  Location: [facility] OR;  Service: General;  Laterality: N/A;   COLOSTOMY N/A 11/25/2019   Procedure: CREATION OF COLOSTOMY;  Surgeon: Gabrielle Lint, MD;  Location: [facility];  Service: General;  Laterality: N/A;   INCISION AND DRAINAGE ABSCESS N/A 11/25/2019   Procedure: INCISION AND DRAINAGE ABDOMINAL ABSCESS;  Surgeon: Gabrielle Lint, MD;  Location: [facility];  Service: General;  Laterality: N/A;   IR IVC FILTER PLMT / S&I [name] GUID/MOD SED  12/03/2019   LAPAROTOMY N/A 11/25/2019   Procedure: EXPLORATORY LAPAROTOMY;  Surgeon: Gabrielle Lint, MD;  Location: [facility];  Service: General;  Laterality: N/A;    Social History   Socioeconomic History   Marital status: Single    Spouse name: Not on file   Number of children: Not on file   Years of education: Not on file   Highest education level: Not on file  Occupational History   Not on file  Tobacco Use   Smoking status: Never Smoker   Smokeless tobacco: Never Used  Vaping Use   Vaping Use: Never used  Substance and Sexual Activity   Alcohol use: No   Drug use: No   Sexual activity: Not on file  Other Topics Concern   Not on file  Social History Narrative   Not on file   Social Determinants of Health   Financial Resource Strain: Not on file  Food Insecurity: Not on file  Transportation Needs: Not on file  Physical Activity: Not on file  Stress: Not on  file  Social Connections: Not on file  Intimate Partner Violence: Not on file    Family History  Problem Relation Age of Onset   Hypertension Mother    Diabetes Mother    Cancer Father     Medications Prior to Admission  Medication Sig Dispense Refill Last Dose   ALPRAZolam (XANAX) 0.5 MG tablet Take 0.5 mg by mouth 3 (three) times daily as needed for anxiety.   Past Month at Unknown time   losartan-hydrochlorothiazide (HYZAAR) 50-12.5 MG per tablet Take 1 tablet by mouth daily.   09/07/2020 at Unknown time   methocarbamol (ROBAXIN) 500 MG tablet Take 1 tablet (500 mg total) by mouth every 6 (six) hours as needed for muscle spasms. (Patient taking differently: Take 500 mg by mouth every 8 (eight) hours as needed for muscle spasms.) 60 tablet 0 Past Week at Unknown time   Multiple Vitamin (MULTIVITAMIN WITH MINERALS) TABS tablet Take 1 tablet by mouth 2 (two) times a week.   Past Week at Unknown time   polyethylene glycol (MIRALAX / GLYCOLAX) 17 g packet Take 17 g by mouth daily as needed for mild constipation. 14 each 0 Past Week at Unknown time   promethazine (PHENERGAN) 25 MG tablet Take 25 mg by mouth every 12 (twelve) hours as needed for nausea or vomiting.   Past Week at Unknown time   XARELTO 20 MG TABS tablet TAKE 1 TABLET BY MOUTH DAILY WITH SUPPER. 30 tablet 3 Past Month at Unknown time   ferrous sulfate 325 (65 FE) MG tablet Take 1 tablet (325 mg total) by mouth 2 (two) times daily with a meal. 60 tablet 0    saccharomyces boulardii (FLORASTOR) 250 MG capsule Take 1 capsule (250 mg total) by mouth 2 (two) times daily. (Patient not taking: Reported on 08/24/2020)   Not Taking at Unknown time    Current Facility-Administered Medications  Medication Dose Route Frequency Provider Last Rate Last Admin   bupivacaine liposome (EXPAREL) 1.3 % injection 266 mg  20 mL Infiltration On Call to OR Gabrielle Soda, MD       cefoTEtan (CEFOTAN) 2 g in sodium chloride 0.9 % 100 mL IVPB  2 g  Intravenous On Call to OR Gabrielle Soda, MD       Chlorhexidine Gluconate Cloth 2 % PADS 6 each  6 each Topical Once Gabrielle Soda, MD       And   Chlorhexidine Gluconate Cloth 2 % PADS 6 each  6 each Topical Once Gabrielle Soda, MD       lactated ringers infusion   Intravenous Continuous Marcene Duos, MD 10 mL/hr at 09/08/20 0949 New Bag at 09/08/20 0949     Allergies  Allergen Reactions   Other     Blood refusal   Aspirin Diarrhea    stomach ache.     BP (!) 145/87   Pulse 92   Temp 98.5 F (36.9 C) (Oral)   Resp 17   Wt 116.7 kg   SpO2 99%   BMI 41.54 kg/m   Labs:  Results for orders placed or performed during the hospital encounter of 09/06/20 (from the past 48 hour(s))  SARS CORONAVIRUS 2 (TAT 6-24 HRS) Nasopharyngeal Nasopharyngeal Swab     Status: None   Collection Time: 09/06/20  2:05 PM   Specimen: Nasopharyngeal Swab  Result Value Ref Range   SARS Coronavirus 2 NEGATIVE NEGATIVE    Comment: (NOTE) SARS-CoV-2 target nucleic acids are NOT DETECTED.  The SARS-CoV-2 RNA is generally detectable in upper and lower respiratory specimens during the acute phase of infection. Negative results do not preclude SARS-CoV-2 infection, do not rule out co-infections with other pathogens, and should not be used as the sole basis for treatment or other patient management decisions. Negative results must be combined with clinical observations, patient history, and epidemiological information. The expected result is Negative.  Fact Sheet for Patients: HairSlick.no  Fact Sheet for Healthcare Providers: quierodirigir.com  This test is not yet approved or cleared by the [place] FDA and  has been authorized for detection and/or diagnosis of SARS-CoV-2 by FDA under an Emergency Use Authorization (EUA). This EUA will remain  in effect (meaning this test can be used) for the duration of the COVID-19 declaration under  Se ction 564(b)(1) of the Act, 21 U.S.C. section 360bbb-3(b)(1), unless the authorization is terminated or revoked sooner.  Performed at [facility], [address], [place], [place] [address]     Imaging / Studies: No results found.   Ardeth Sportsman, M.D., F.A.C.S. Gastrointestinal and Minimally Invasive Surgery [facility] [address], [address] [place], [place] [address] [phone] Main / Paging  09/08/2020 12:10 PM     Ardeth Sportsman

## 2020-09-08 NOTE — Op Note (Signed)
09/08/2020  4:01 PM  PATIENT:  [name]  55 y.o. female  Patient Care Team: Soundra Pilon, FNP as PCP - General (Family Medicine) Karie Soda, MD as Consulting Physician (Colon and Rectal Surgery) Nada Libman, MD as Consulting Physician (Vascular Surgery) Kerin Salen, MD as Consulting Physician (Gastroenterology)  PRE-OPERATIVE DIAGNOSIS:   COLOSTOMY FOR COLON RESECTION, DESIRE FOR OSTOMY TAKEDOWN  POST-OPERATIVE DIAGNOSIS:   COLOSTOMY FOR COLON RESECTION, DESIRE FOR OSTOMY TAKEDOWN PARASTOMAL HERNIA INCISIONAL HERNIAS, INCARCERATED  PROCEDURE:   ROBOTIC ASSISTED COLOSTOMY TAKEDOWN PRIMARY INCISIONAL HERNIA REPAIRS PRIMARY PARASTOMAL HERNIA REPAIR ROBOTIC LYSIS OF ADHESIONS X 90 MIN (1/2 CASE) RIGID PROCTOSCOPY  SURGEON:  Ardeth Sportsman, MD  ASSISTANT: Romie Levee, MD, FACS. An experienced assistant was required given the standard of surgical care given the complexity of the case.  This assistant was needed for exposure, dissection, suction, tissue approximation, retraction, perception, etc.  ANESTHESIA:     General  Nerve block provided with liposomal bupivacaine (Experel) mixed with 0.25% bupivacaine as a Bilateral TAP block x 35mL each side at the level of the transverse abdominis & preperitoneal spaces along the flank at the anterior axillary line, from subcostal ridge to iliac crest under laparoscopic guidance   Local field block at port sites & extraction wound  EBL:  Total I/O In: 1100 [I.V.:1000; IV Piggyback:100] Out: 375 [Urine:250; Blood:125]  Delay start of Pharmacological VTE agent (>24hrs) due to surgical blood loss or risk of bleeding:  no  DRAINS: No  SPECIMEN:   END COLOSTOMY PROXIMAL & DISTAL ANASTOMOTIC RINGS (prolene blue stitch in proximal ring)  DISPOSITION OF SPECIMEN:  PATHOLOGY  COUNTS:  YES  PLAN OF CARE: Admit to inpatient   PATIENT DISPOSITION:  PACU - hemodynamically stable.  INDICATION:    Pleasant woman who  required emergent open Hartmann resection for perforated sigmoid diverticulitis with feculent peritonitis complicated by need for bowel resection and delayed abscess formation.  Eventually recovered.  Developed parastomal hernia around colostomy.  Desired colostomy takedown.  The anatomy & physiology of the digestive tract was discussed.  The pathophysiology was discussed.  Possibility of remaining with an ostomy permanently was discussed.  I offered ostomy takedown.  Laparoscopic & open techniques were discussed.   Risks such as bleeding, infection, abscess, leak, reoperation, possible re-ostomy, injury to other organs, need for repair of tissues / organs, need for further treatment, hernia, heart attack, death, and other risks were discussed.   I noted a good likelihood this will help address the problem.  Goals of post-operative recovery were discussed as well.  We will work to minimize complications.  Questions were answered.  The patient expresses understanding & wishes to proceed with surgery.  OR FINDINGS:   Patient had moderately dense omental adhesions to anterior abdominal wall and interloop adhesions that were stretched out.  Numerous bands causing numerous internal hernias without any definite obstruction.  Parastomal hernia 8 x 7 cm primarily repaired.  Midline periumbilical incisional hernias 1.5 and 3 cm incarcerated omentum.  Reduced down and primarily repaired.  No mesh repair given colon case.  No obvious metastatic disease on visceral parietal peritoneum or liver.  The anastomosis rests 14 cm from the anal verge by rigid proctoscopy.  CASE DATA:  Type of patient?: Elective [facility] Private Case  Status of Case? Elective Scheduled  Infection Present At Time Of Surgery (PATOS)?  NO  DESCRIPTION:   Informed consent was confirmed.  The patient underwent general anaesthesia without difficulty.  The patient was positioned  appropriately.  VTE prevention in place.  Because of her  hostile abdomen I asked urology to do cystoscopy with firefly injection ureters.  Dr. Liliane Shi was able to do that without much difficulty.  Please see his operative note.  Under the colostomy client & sewed pursestring around the colostomy.  I disimpacted the rectal stump with examination under anesthesia.  Mild right anterior internal hemorrhoid but no other abnormalities.  Sphincter tone intact.  The patient was clipped, prepped, & draped in a sterile fashion.  Surgical timeout confirmed our plan.  The patient was positioned in reverse Trendelenburg.  Abdominal entry was gained using Varess technique at the left subcostal ridge on the anterior abdominal wall.  No elevated EtCO2 noted.  Port placed.  Camera inspection revealed no injury.  Upon entering the abdomen (organ space), I encountered dense omental adhesions.  I freed a few omental adhesions off such that extra ports were carefully placed under direct laparoscopic visualization. The Intuitive daVinci robot was docked with camera & instruments carefully placed.  I then focused on lyse adhesions.  I freed off the greater omentum and small bowel adhesions to the anterior abdominal wall using focused blunt as well as vessel sealer scissors and bipolar dissection.  When she reduced down to better locate the colostomy in the left lower quadrant going through her panniculus.  Moderate sized parastomal hernia.  I did robotic lysis od adhesions to free a few small bowel loops densely adherent to the colon and one up in the parastomal hernia sac.  Had Swiss cheeselike hernia spots with omentum incarcerated within them.  We were able to reduce those down.  Then freed off numerous interloop adhesions as well as long tape bands involving much of the lower abdomen going down to the pelvis.  Resected these bands and removed them.  Freed off small bowel loops out of the pelvis.  Freed small bowel loops off the left retroperitoneum such that there is nothing between the  colostomy and the rectal stump.  Located the rectal stump with Prolene sutures.  Freed off some adhesions in the pelvis type a rather soft like cotton candy.  He.  It was an appropriate transection with a stitch at the proximal rectum.  There was no major scarring adhesions.  No retained abscess.   We did careful inspection of small bowel and omentum and ensured hemostasis.  No injury or other abnormality.  The left colon had been mobilized somewhat up to the splenic flexure.  We are able to free the descending colon off its adhesions to the anterior abdominal wall from the peritoneal hernia sac side.  Came around up to the fascia.  She had a moderately large hernia sac. It was somewhat stretched out through her thick abdominal wall/panniculus.  I felt we had enough length to reach the rectal stump.  Dr. Maisie Fus agreed.  I carefully undocked the robot.  I made a circular incision around the colostomy and I was able to connect with the intraperitoneal dissection around the hernia sac.  Annitta Needs it off circumferentially and confirmed good blood supply and stretch.  Placed a wound protector.  I was able to eviscerate descending colon out the wound.   I chose an area a centimeter proximal to the skin that had good blood supply.  Clamped the colonusing a reusable pursestringer device.  Passed a 2-0 Keith needle. I transected at the descending/sigmoid junction with a scalpel. I got healthy bleeding mucosa.  We sent the rectosigmoid colon specimen off  to go to pathology.  We sized the colon orifice.  I chose a 12mm EEA anvil stapler system.  I reinforced the prolene pursestring with interrupted silk suture.  I placed the anvil to the open end of the proximal remaining colon and closed around it using the pursestring.    We did copious irrigation with crystalloid solution.  Hemostasis was good.  The distal end of the remaining colon easily reached down to the rectal stump, therefore, splenic flexure mobilization was not  needed.      I scrubbed down and did gentle anal dilation and advanced the EEA stapler up the rectal stump. The spike was brought out at the provimal end of the rectal stump under direct visualization.  Dr Maisie Fus  attached the anvil of the proximal colon the spike of the stapler. Anvil was tightened down and held clamped for 60 seconds. The EEA stapler was fired and held clamped for 30 seconds. The stapler was released & removed. We noted 2 excellent anastomotic rings. Blue stitch is in the proximal ring.  I  did rigid proctoscopy noted the anastomosis was at 14 cm from the anal verge consistent with the proximal rectum.  We irrigation of isotonic solution & held that for the pelvic air leak test .  The rectum was insufflated the rectum while clamping the colon proximal to that anastomosis.  There was a negative air leak test. There was no tension of mesentery or bowel at the anastomosis.   Tissues looked viable.  Ureters & bowel uninjured.  The anastomosis looked healthy.  Patient still had moderate.  Omental adhesions to the liver that limited his ability to come down.  I did not feel strongly on mobilizing it further since it was somewhat thickened and friable.  Dr. Maisie Fus agreed.  Endoluminal gas was evacuated.  Ports & wound protector removed.  We changed gloves & redraped the patient per colon SSI prevention protocol.  We aspirated the  irrigation.  Hemostasis was good.  Sterile unused instruments were used from this point.  Is able to fill the 2 periumbilical hernias and using #1 PDS figure-of-eight sutures interrupted to close these down vertically adequately.  I wanted to try and close these down to prevent any small bowel going up into it.  Excised hernia sac and some redundant skin.  Had healthy fascia.  The fascia around the parastomal hernia seem to close better obliquely from lateral to medial.  We closed this using #1 PDS in a running fashion from each corner to good result without tension at all.   I did not feel it was safe to do mesh repair in the setting of a colon case.  I again trimmed off redundant hernia sac and skin to minimize as much dead space.  Close that using interrupted Vicryl suture more deeply as well as interrupted deep dermal sutures and partial running 4 Monocryl suture. I placed antibiotic-soaked wicks into the closure at the corners x2.  I closed the skin with some interrupted Monocryl stitches.  Sterile dressings applied  Patient is being extubated go to recovery room. I had discussed postop care with the patient in detail the office & in the holding area. Instructions are written. I discussed operative findings, updated the patient's status, discussed probable steps to recovery, and gave postoperative recommendations to the patient's brother, Eudelia Bunch.  Recommendations were made.  Questions were answered.  He expressed understanding & appreciation.  Ardeth Sportsman, M.D., F.A.C.S. [facility] [address] [place], [place] [address] [phone] Main / Paging

## 2020-09-08 NOTE — Anesthesia Preprocedure Evaluation (Addendum)
Anesthesia Evaluation  Patient identified by MRN, date of birth, ID band Patient awake    Reviewed: Allergy & Precautions, NPO status , Patient's Chart, lab work & pertinent test results  Airway Mallampati: II  TM Distance: >3 FB Neck ROM: Full    Dental no notable dental hx.    Pulmonary neg pulmonary ROS,    Pulmonary exam normal breath sounds clear to auscultation       Cardiovascular Exercise Tolerance: Good hypertension, Pt. on medications + DVT (on Xarelto)  Normal cardiovascular exam Rhythm:Regular Rate:Normal     Neuro/Psych PSYCHIATRIC DISORDERS Anxiety Depression negative neurological ROS     GI/Hepatic Neg liver ROS, GERD  ,Diverticulitis   Endo/Other  Morbid obesityPre-Diabetes  Renal/GU negative Renal ROS  negative genitourinary   Musculoskeletal  (+) Arthritis , Osteoarthritis,    Abdominal   Peds  Hematology  (+) REFUSES BLOOD PRODUCTS, JEHOVAH'S WITNESS  Anesthesia Other Findings   Reproductive/Obstetrics                            Anesthesia Physical Anesthesia Plan  ASA: III  Anesthesia Plan: General   Post-op Pain Management:    Induction: Intravenous  PONV Risk Score and Plan: 3 and Treatment may vary due to age or medical condition, Dexamethasone and Ondansetron  Airway Management Planned: Oral ETT  Additional Equipment: None  Intra-op Plan:   Post-operative Plan: Extubation in OR  Informed Consent: I have reviewed the patients History and Physical, chart, labs and discussed the procedure including the risks, benefits and alternatives for the proposed anesthesia with the patient or authorized representative who has indicated his/her understanding and acceptance.     Dental advisory given  Plan Discussed with: CRNA  Anesthesia Plan Comments: (Discussed with patient she does NOT want blood products. OK to receive albumin if absolutely necessary.  Would prefer NS/LR. Tanna Furry, MD  )       Anesthesia Quick Evaluation

## 2020-09-08 NOTE — Anesthesia Procedure Notes (Signed)
Procedure Name: Intubation Date/Time: 09/08/2020 1:15 PM Performed by: Victoriano Lain, CRNA Pre-anesthesia Checklist: Patient identified, Emergency Drugs available, Suction available, Patient being monitored and Timeout performed Patient Re-evaluated:Patient Re-evaluated prior to induction Oxygen Delivery Method: Circle system utilized Preoxygenation: Pre-oxygenation with 100% oxygen Induction Type: IV induction Ventilation: Mask ventilation without difficulty Laryngoscope Size: Mac and 4 Grade View: Grade II Tube type: Oral Tube size: 7.5 mm Number of attempts: 2 Airway Equipment and Method: Stylet Placement Confirmation: ETT inserted through vocal cords under direct vision,  positive ETCO2 and breath sounds checked- equal and bilateral Secured at: 22 cm Tube secured with: Tape Dental Injury: Teeth and Oropharynx as per pre-operative assessment  Comments: Smooth IV induction. Easy mask. DL X 1 with MAC 4 by CRNA. Grade 2 view. -ETCO2. DL X 1 by DR Elgie Congo. Grade 2 view. + ETCO2. BBS=. ATOI. ETT secured at 22 cm at the lip

## 2020-09-08 NOTE — Discharge Instructions (Signed)
SURGERY: POST OP INSTRUCTIONS (Surgery for small bowel obstruction, colon resection, etc)   ######################################################################  EAT Gradually transition to a high fiber diet with a fiber supplement over the next few days after discharge  WALK Walk an hour a day.  Control your pain to do that.    CONTROL PAIN Control pain so that you can walk, sleep, tolerate sneezing/coughing, go up/down stairs.  HAVE A BOWEL MOVEMENT DAILY Keep your bowels regular to avoid problems.  OK to try a laxative to override constipation.  OK to use an antidairrheal to slow down diarrhea.  Call if not better after 2 tries  CALL IF YOU HAVE PROBLEMS/CONCERNS Call if you are still struggling despite following these instructions. Call if you have concerns not answered by these instructions  ######################################################################   DIET Follow a light diet the first few days at home.  Start with a bland diet such as soups, liquids, starchy foods, low fat foods, etc.  If you feel full, bloated, or constipated, stay on a ful liquid or pureed/blenderized diet for a few days until you feel better and no longer constipated. Be sure to drink plenty of fluids every day to avoid getting dehydrated (feeling dizzy, not urinating, etc.). Gradually add a fiber supplement to your diet over the next week.  Gradually get back to a regular solid diet.  Avoid fast food or heavy meals the first week as you are more likely to get nauseated. It is expected for your digestive tract to need a few months to get back to normal.  It is common for your bowel movements and stools to be irregular.  You will have occasional bloating and cramping that should eventually fade away.  Until you are eating solid food normally, off all pain medications, and back to regular activities; your bowels will not be normal. Focus on eating a low-fat, high fiber diet the rest of your life  (See Getting to Good Bowel Health, below).  CARE of your INCISION or WOUND It is good for closed incision and even open wounds to be washed every day.  Shower every day.  Short baths are fine.  Wash the incisions and wounds clean with soap & water.     If you have a closed incision(s), wash the incision with soap & water every day.  You may leave closed incisions open to air if it is dry.   You may cover the incision with clean gauze & replace it after your daily shower for comfort.  It is good for closed incisions and even open wounds to be washed every day.  Shower every day.  Short baths are fine.  Wash the incisions and wounds clean with soap & water.    You may leave closed incisions open to air if it is dry.   You may cover the incision with clean gauze & replace it after your daily shower for comfort.  TEGADERM:  You have clear gauze band-aid dressings over your closed incision(s).  Remove the dressings 3 days after surgery.  Remove any shoelace wicks from your old colostomy site incision  If you have an open wound with a wound vac, see wound vac care instructions.     ACTIVITIES as tolerated Start light daily activities --- self-care, walking, climbing stairs-- beginning the day after surgery.  Gradually increase activities as tolerated.  Control your pain to be active.  Stop when you are tired.  Ideally, walk several times a day, eventually an hour a day.  Most people are back to most day-to-day activities in a few weeks.  It takes 4-8 weeks to get back to unrestricted, intense activity. If you can walk 30 minutes without difficulty, it is safe to try more intense activity such as jogging, treadmill, bicycling, low-impact aerobics, swimming, etc. Save the most intensive and strenuous activity for last (Usually 4-8 weeks after surgery) such as sit-ups, heavy lifting, contact sports, etc.  Refrain from any intense heavy lifting or straining until you are off narcotics for pain control.   You will have off days, but things should improve week-by-week. DO NOT PUSH THROUGH PAIN.  Let pain be your guide: If it hurts to do something, don't do it.  Pain is your body warning you to avoid that activity for another week until the pain goes down. You may drive when you are no longer taking narcotic prescription pain medication, you can comfortably wear a seatbelt, and you can safely make sudden turns/stops to protect yourself without hesitating due to pain. You may have sexual intercourse when it is comfortable. If it hurts to do something, stop.  MEDICATIONS Take your usually prescribed home medications unless otherwise directed.   Blood thinners:  Usually you can restart any strong blood thinners after the second postoperative day.  It is OK to take aspirin right away.     If you are on strong blood thinners (warfarin/Coumadin, Plavix, Xerelto, Eliquis, Pradaxa, etc), discuss with your surgeon, medicine PCP, and/or cardiologist for instructions on when to restart the blood thinner & if blood monitoring is needed (PT/INR blood check, etc).     PAIN CONTROL Pain after surgery or related to activity is often due to strain/injury to muscle, tendon, nerves and/or incisions.  This pain is usually short-term and will improve in a few months.  To help speed the process of healing and to get back to regular activity more quickly, DO THE FOLLOWING THINGS TOGETHER: 1. Increase activity gradually.  DO NOT PUSH THROUGH PAIN 2. Use Ice and/or Heat 3. Try Gentle Massage and/or Stretching 4. Take over the counter pain medication 5. Take Narcotic prescription pain medication for more severe pain  Good pain control = faster recovery.  It is better to take more medicine to be more active than to stay in bed all day to avoid medications. 1.  Increase activity gradually Avoid heavy lifting at first, then increase to lifting as tolerated over the next 6 weeks. Do not "push through" the pain.  Listen to  your body and avoid positions and maneuvers than reproduce the pain.  Wait a few days before trying something more intense Walking an hour a day is encouraged to help your body recover faster and more safely.  Start slowly and stop when getting sore.  If you can walk 30 minutes without stopping or pain, you can try more intense activity (running, jogging, aerobics, cycling, swimming, treadmill, sex, sports, weightlifting, etc.) Remember: If it hurts to do it, then don't do it! 2. Use Ice and/or Heat You will have swelling and bruising around the incisions.  This will take several weeks to resolve. Ice packs or heating pads (6-8 times a day, 30-60 minutes at a time) will help sooth soreness & bruising. Some people prefer to use ice alone, heat alone, or alternate between ice & heat.  Experiment and see what works best for you.  Consider trying ice for the first few days to help decrease swelling and bruising; then, switch to heat to help relax sore  spots and speed recovery. Shower every day.  Short baths are fine.  It feels good!  Keep the incisions and wounds clean with soap & water.   3. Try Gentle Massage and/or Stretching Massage at the area of pain many times a day Stop if you feel pain - do not overdo it 4. Take over the counter pain medication This helps the muscle and nerve tissues become less irritable and calm down faster Choose ONE of the following over-the-counter anti-inflammatory medications: Acetaminophen 500mg  tabs (Tylenol) 1-2 pills with every meal and just before bedtime (avoid if you have liver problems or if you have acetaminophen in you narcotic prescription) Naproxen 220mg  tabs (ex. Aleve, Naprosyn) 1-2 pills twice a day (avoid if you have kidney, stomach, IBD, or bleeding problems) Ibuprofen 200mg  tabs (ex. Advil, Motrin) 3-4 pills with every meal and just before bedtime (avoid if you have kidney, stomach, IBD, or bleeding problems) Take with food/snack several times a day as  directed for at least 2 weeks to help keep pain / soreness down & more manageable. 5. Take Narcotic prescription pain medication for more severe pain A prescription for strong pain control is often given to you upon discharge (for example: oxycodone/Percocet, hydrocodone/Norco/Vicodin, or tramadol/Ultram) Take your pain medication as prescribed. Be mindful that most narcotic prescriptions contain Tylenol (acetaminophen) as well - avoid taking too much Tylenol. If you are having problems/concerns with the prescription medicine (does not control pain, nausea, vomiting, rash, itching, etc.), please call [phone] to see if we need to switch you to a different pain medicine that will work better for you and/or control your side effects better. If you need a refill on your pain medication, you must call the office before 4 pm and on weekdays only.  By federal law, prescriptions for narcotics cannot be called into a pharmacy.  They must be filled out on paper & picked up from our office by the patient or authorized caretaker.  Prescriptions cannot be filled after 4 pm nor on weekends.    WHEN TO CALL [phone] Severe uncontrolled or worsening pain  Fever over 101 F (38.5 C) Concerns with the incision: Worsening pain, redness, rash/hives, swelling, bleeding, or drainage Reactions / problems with new medications (itching, rash, hives, nausea, etc.) Nausea and/or vomiting Difficulty urinating Difficulty breathing Worsening fatigue, dizziness, lightheadedness, blurred vision Other concerns If you are not getting better after two weeks or are noticing you are getting worse, contact our office [phone] for further advice.  We may need to adjust your medications, re-evaluate you in the office, send you to the emergency room, or see what other things we can do to help. The clinic staff is available to answer your questions during regular business hours (8:30am-5pm).  Please don't hesitate  to call and ask to speak to one of our nurses for clinical concerns.    A surgeon from [facility] is always on call at the hospitals 24 hours/day If you have a medical emergency, go to the nearest emergency room or call 911.  FOLLOW UP in our office One the day of your discharge from the hospital (or the next business weekday), please call [facility] to set up or confirm an appointment to see your surgeon in the office for a follow-up appointment.  Usually it is 2-3 weeks after your surgery.   If you have skin staples at your incision(s), let the office know so we can set up a time in  the office for the nurse to remove them (usually around 10 days after surgery). Make sure that you call for appointments the day of discharge (or the next business weekday) from the hospital to ensure a convenient appointment time. IF YOU HAVE DISABILITY OR FAMILY LEAVE FORMS, BRING THEM TO THE OFFICE FOR PROCESSING.  DO NOT GIVE THEM TO YOUR DOCTOR.  [facility] [address], [address], [place], [place]  [address] ? [phone] - Main [phone] - Toll Free,  [phone] - Fax [email or web address]  GETTING TO GOOD BOWEL HEALTH. It is expected for your digestive tract to need a few months to get back to normal.  It is common for your bowel movements and stools to be irregular.  You will have occasional bloating and cramping that should eventually fade away.  Until you are eating solid food normally, off all pain medications, and back to regular activities; your bowels will not be normal.   Avoiding constipation The goal: ONE SOFT BOWEL MOVEMENT A DAY!    Drink plenty of fluids.  Choose water first. TAKE A FIBER SUPPLEMENT EVERY DAY THE REST OF YOUR LIFE During your first week back home, gradually add back a fiber supplement every day Experiment which form you can tolerate.   There are many forms such as powders, tablets, wafers, gummies,  etc Psyllium bran (Metamucil), methylcellulose (Citrucel), Miralax or Glycolax, Benefiber, Flax Seed.  Adjust the dose week-by-week (1/2 dose/day to 6 doses a day) until you are moving your bowels 1-2 times a day.  Cut back the dose or try a different fiber product if it is giving you problems such as diarrhea or bloating. Sometimes a laxative is needed to help jump-start bowels if constipated until the fiber supplement can help regulate your bowels.  If you are tolerating eating & you are farting, it is okay to try a gentle laxative such as double dose MiraLax, prune juice, or Milk of Magnesia.  Avoid using laxatives too often. Stool softeners can sometimes help counteract the constipating effects of narcotic pain medicines.  It can also cause diarrhea, so avoid using for too long. If you are still constipated despite taking fiber daily, eating solids, and a few doses of laxatives, call our office. Controlling diarrhea Try drinking liquids and eating bland foods for a few days to avoid stressing your intestines further. Avoid dairy products (especially milk & ice cream) for a short time.  The intestines often can lose the ability to digest lactose when stressed. Avoid foods that cause gassiness or bloating.  Typical foods include beans and other legumes, cabbage, broccoli, and dairy foods.  Avoid greasy, spicy, fast foods.  Every person has some sensitivity to other foods, so listen to your body and avoid those foods that trigger problems for you. Probiotics (such as active yogurt, Align, etc) may help repopulate the intestines and colon with normal bacteria and calm down a sensitive digestive tract Adding a fiber supplement gradually can help thicken stools by absorbing excess fluid and retrain the intestines to act more normally.  Slowly increase the dose over a few weeks.  Too much fiber too soon can backfire and cause cramping & bloating. It is okay to try and slow down diarrhea with a few doses of  antidiarrheal medicines.   Bismuth subsalicylate (ex. Kayopectate, Pepto Bismol) for a few doses can help control diarrhea.  Avoid if pregnant.   Loperamide (Imodium) can slow down diarrhea.  Start with one tablet ( ) first.  Avoid if you are having fevers or severe pain.  ILEOSTOMY PATIENTS WILL HAVE CHRONIC DIARRHEA since their colon is not in use.    Drink plenty of liquids.  You will need to drink even more glasses of water/liquid a day to avoid getting dehydrated. Record output from your ileostomy.  Expect to empty the bag every 3-4 hours at first.  Most people with a permanent ileostomy empty their bag 4-6 times at the least.   Use antidiarrheal medicine (especially Imodium) several times a day to avoid getting dehydrated.  Start with a dose at bedtime & breakfast.  Adjust up or down as needed.  Increase antidiarrheal medications as directed to avoid emptying the bag more than 8 times a day (every 3 hours). Work with your wound ostomy nurse to learn care for your ostomy.  See ostomy care instructions. TROUBLESHOOTING IRREGULAR BOWELS 1) Start with a soft & bland diet. No spicy, greasy, or fried foods.  2) Avoid gluten/wheat or dairy products from diet to see if symptoms improve. 3) Miralax 17gm or flax seed mixed in [place]. water or juice-daily. May use 2-4 times a day as needed. 4) Gas-X, Phazyme, etc. as needed for gas & bloating.  5) Prilosec (omeprazole) over-the-counter as needed 6)  Consider probiotics (Align, Activa, etc) to help calm the bowels down  Call your doctor if you are getting worse or not getting better.  Sometimes further testing (cultures, endoscopy, X-ray studies, CT scans, bloodwork, etc.) may be needed to help diagnose and treat the cause of the diarrhea. [facility], [place], [place], [place], [place]  [address] [phone] - Main.    [phone]  - Toll Free.   [phone] - Fax [email or web address]

## 2020-09-08 NOTE — Op Note (Signed)
Operative Note  Preoperative diagnosis:  1.  History of perforated diverticulitis requiring partial colectomy and colostomy creation  Postoperative diagnosis: Same  Procedure(s): 1.  Cystoscopy with bilateral ureteral FireFly injections  Surgeon: Rhoderick Moody, MD  Assistants:  None   Anesthesia:  General  Complications:  None  EBL:  <5 mL  Specimens: 1. None  Drains/Catheters: 1.  16 French Foley  Intraoperative findings:   1. No intravesical pathology was seen on cystoscopy  Indication:  The patient is a 55 year old female with a history of perforated diverticulitis requiring a partial colectomy with Dr. Michaell Cowing.    She is here today for colostomy reversal.  Urology has been consulted to performed cystoscopy with bilateral ureteral Fire Fly injection to aide in intraoperative ureteral identification. The patient has been consented for the above procedures, voices understanding and wishes to proceed.  The patient has been consented for the above procedures, voices understanding and wishes to procede  Description of procedure: After informed consent was obtained, the patient was brought to the operating room and general endotracheal anesthesia was administered. The patient was then placed in the dorsolithotomy position and prepped and draped in usual sterile fashion. A timeout was performed. A 21 French rigid cystoscope was then inserted into the urethral meatus and advanced into the bladder under direct vision. A complete bladder survey revealed no intravesical pathology.   A 6 [place] open-ended catheter was then used to intubate the right ureteral orifice and a total of 7.5 mL of firefly solution diluted with 10 mL of saline was injected into the right collecting system. A similar maneuver was then carried out on the left with the same volume and concentration of firefly. The rigid cystoscope was then removed under direct vision. A 16 French Foley catheter was then inserted  and placed to gravity drainage. The patient tolerated the procedure well. Dr. Michaell Cowing then proceeded with their portion of the case  Plan:  Foley removal is at the discretion of the primary team.

## 2020-09-08 NOTE — H&P (Signed)
Urology Preoperative H&P   Chief Complaint: Diverticular disease  History of Present Illness: [name] is a 55 y.o. female with a history of perforated diverticulitis that required sigmoid colectomy with colostomy creation on 11/25/2019 with Dr. Michaell Cowing.  She is here today for colostomy takedown.  Urology has been consulted to perform cystoscopy with bilateral firefly ureteral injections to aid in ureteral identification during the surgery.  The patient denies a prior history of GU surgery, trauma, infections or voiding dysfunction.  Past Medical History:  Diagnosis Date  . Anxiety   . Arthritis   . Diverticulitis   . DVT (deep venous thrombosis) (HCC)   . Hypertension   . Pre-diabetes   . Refusal of blood product     Past Surgical History:  Procedure Laterality Date  . ABDOMINAL HYSTERECTOMY    . BOWEL RESECTION N/A 11/25/2019   Procedure: SMALL BOWEL RESECTION;  Surgeon: Almond Lint, MD;  Location: [facility] OR;  Service: General;  Laterality: N/A;  . COLECTOMY N/A 11/25/2019   Procedure: SIGMOID COLECTOMY;  Surgeon: Almond Lint, MD;  Location: [facility] OR;  Service: General;  Laterality: N/A;  . COLOSTOMY N/A 11/25/2019   Procedure: CREATION OF COLOSTOMY;  Surgeon: Almond Lint, MD;  Location: [facility] OR;  Service: General;  Laterality: N/A;  . INCISION AND DRAINAGE ABSCESS N/A 11/25/2019   Procedure: INCISION AND DRAINAGE ABDOMINAL ABSCESS;  Surgeon: Almond Lint, MD;  Location: [facility] OR;  Service: General;  Laterality: N/A;  . IR IVC FILTER PLMT / S&I Lenise Arena GUID/MOD SED  12/03/2019  . LAPAROTOMY N/A 11/25/2019   Procedure: EXPLORATORY LAPAROTOMY;  Surgeon: Almond Lint, MD;  Location: [facility] OR;  Service: General;  Laterality: N/A;    Allergies:  Allergies  Allergen Reactions  . Other     Blood refusal  . Aspirin Diarrhea    stomach ache.     Family History  Problem Relation Age of Onset  . Hypertension Mother   . Diabetes Mother   . Cancer Father     Social History:  reports that she  has never smoked. She has never used smokeless tobacco. She reports that she does not drink alcohol and does not use drugs.  ROS: A complete review of systems was performed.  All systems are negative except for pertinent findings as noted.  Physical Exam:  Vital signs in last 24 hours: Temp:  [98.5 F (36.9 C)] 98.5 F (36.9 C) (06/08 0947) Pulse Rate:  [92] 92 (06/08 0947) Resp:  [17] 17 (06/08 0947) BP: (145)/(87) 145/87 (06/08 0947) SpO2:  [99 %] 99 % (06/08 0947) Weight:  [116.7 kg] 116.7 kg (06/08 0947) Constitutional:  Alert and oriented, No acute distress Cardiovascular: Regular rate and rhythm, No JVD Respiratory: Normal respiratory effort, Lungs clear bilaterally GI: Abdomen is soft, nontender, nondistended, no abdominal masses GU: No CVA tenderness Lymphatic: No lymphadenopathy Neurologic: Grossly intact, no focal deficits Psychiatric: Normal mood and affect  Laboratory Data:  No results for input(s): WBC, HGB, HCT, PLT in the last 72 hours.  No results for input(s): NA, K, CL, GLUCOSE, BUN, CALCIUM, CREATININE in the last 72 hours.  Invalid input(s): CO3   No results found for this or any previous visit (from the past 24 hour(s)). Recent Results (from the past 240 hour(s))  SARS CORONAVIRUS 2 (TAT 6-24 HRS) Nasopharyngeal Nasopharyngeal Swab     Status: None   Collection Time: 09/06/20  2:05 PM   Specimen: Nasopharyngeal Swab  Result Value Ref Range Status   SARS Coronavirus 2  NEGATIVE NEGATIVE Final    Comment: (NOTE) SARS-CoV-2 target nucleic acids are NOT DETECTED.  The SARS-CoV-2 RNA is generally detectable in upper and lower respiratory specimens during the acute phase of infection. Negative results do not preclude SARS-CoV-2 infection, do not rule out co-infections with other pathogens, and should not be used as the sole basis for treatment or other patient management decisions. Negative results must be combined with clinical observations, patient  history, and epidemiological information. The expected result is Negative.  Fact Sheet for Patients: HairSlick.no  Fact Sheet for Healthcare Providers: quierodirigir.com  This test is not yet approved or cleared by the [place] FDA and  has been authorized for detection and/or diagnosis of SARS-CoV-2 by FDA under an Emergency Use Authorization (EUA). This EUA will remain  in effect (meaning this test can be used) for the duration of the COVID-19 declaration under Se ction 564(b)(1) of the Act, 21 U.S.C. section 360bbb-3(b)(1), unless the authorization is terminated or revoked sooner.  Performed at [facility], [address], [place], [place] [address]     Renal Function: Recent Labs    09/02/20 0756  CREATININE 0.61   Estimated Creatinine Clearance: 102 mL/min (by C-G formula based on SCr of 0.61 mg/dL).  Radiologic Imaging: No results found.  I independently reviewed the above imaging studies.  Assessment and Plan [name] is a 55 y.o. female with a history of perforated diverticulitis requiring sigmoid colectomy with colostomy creation.  The patient is here today for colostomy reversal with Dr. Michaell Cowing  -The risk, benefits and alternatives of cystoscopy with bilateral firefly ureteral injections was discussed in detail.  She voices understanding and wishes to proceed.  Rhoderick Moody, MD 09/08/2020, 10:16 AM  [facility] Pager: [phone]

## 2020-09-09 ENCOUNTER — Encounter (HOSPITAL_COMMUNITY): Payer: Self-pay | Admitting: Surgery

## 2020-09-09 LAB — MAGNESIUM: Magnesium: 2 mg/dL (ref 1.7–2.4)

## 2020-09-09 LAB — CBC
HCT: 34 % — ABNORMAL LOW (ref 36.0–46.0)
Hemoglobin: 11.8 g/dL — ABNORMAL LOW (ref 12.0–15.0)
MCH: 27.1 pg (ref 26.0–34.0)
MCHC: 34.7 g/dL (ref 30.0–36.0)
MCV: 78 fL — ABNORMAL LOW (ref 80.0–100.0)
Platelets: 181 10*3/uL (ref 150–400)
RBC: 4.36 MIL/uL (ref 3.87–5.11)
RDW: 14.3 % (ref 11.5–15.5)
WBC: 13.9 10*3/uL — ABNORMAL HIGH (ref 4.0–10.5)
nRBC: 0 % (ref 0.0–0.2)

## 2020-09-09 LAB — BASIC METABOLIC PANEL
Anion gap: 10 (ref 5–15)
BUN: 10 mg/dL (ref 6–20)
CO2: 25 mmol/L (ref 22–32)
Calcium: 8.8 mg/dL — ABNORMAL LOW (ref 8.9–10.3)
Chloride: 103 mmol/L (ref 98–111)
Creatinine, Ser: 0.8 mg/dL (ref 0.44–1.00)
GFR, Estimated: >60 mL/min (ref >60)
Glucose, Bld: 115 mg/dL — ABNORMAL HIGH (ref 70–99)
Potassium: 3.6 mmol/L (ref 3.5–5.1)
Sodium: 138 mmol/L (ref 135–145)

## 2020-09-09 MED ORDER — SODIUM CHLORIDE 0.9 % IV SOLN: 250.0000 mL | INTRAVENOUS | Status: DC | PRN

## 2020-09-09 MED ORDER — SODIUM CHLORIDE 0.9% FLUSH
3.0000 mL | Freq: Two times a day (BID) | INTRAVENOUS | Status: DC
  Administered 2020-09-09 – 2020-09-12 (×6): 3 mL via INTRAVENOUS

## 2020-09-09 MED ORDER — POTASSIUM CHLORIDE CRYS ER 20 MEQ PO TBCR
40.0000 meq | EXTENDED_RELEASE_TABLET | Freq: Every day | ORAL | Status: AC
  Administered 2020-09-09 – 2020-09-11 (×3): 40 meq via ORAL
  Filled 2020-09-09 (×3): qty 2

## 2020-09-09 MED ORDER — SODIUM CHLORIDE 0.9% FLUSH: 3.0000 mL | INTRAVENOUS | Status: DC | PRN

## 2020-09-09 NOTE — Progress Notes (Signed)
[name] [id number] Apr 05, 1965  CARE TEAM:  PCP: Soundra Pilon, FNP  Outpatient Care Team: Patient Care Team: Soundra Pilon, FNP as PCP - General (Family Medicine) Karie Soda, MD as Consulting Physician (Colon and Rectal Surgery) Nada Libman, MD as Consulting Physician (Vascular Surgery) Kerin Salen, MD as Consulting Physician (Gastroenterology)  Inpatient Treatment Team: Treatment Team: Attending Provider: Karie Soda, MD; Registered Nurse: Riki Sheer, RN; Utilization Review: Oval Linsey, RN   Problem List:   Principal Problem:   Diverticular disease of colon Active Problems:   Anemia, chronic disease   Body mass index (BMI) 40.0-44.9, adult (HCC)   Colostomy present (HCC)   Chronic pain   Mixed anxiety and depressive disorder   Refusal of blood transfusions as patient is Jehovah's Witness   Presence of IVC filter   Diverticulitis of sigmoid colon   1 Day Post-Op  09/08/2020  POST-OPERATIVE DIAGNOSIS:   COLOSTOMY FOR COLON RESECTION, DESIRE FOR OSTOMY TAKEDOWN PARASTOMAL HERNIA INCISIONAL HERNIAS, INCARCERATED   PROCEDURE:   ROBOTIC ASSISTED COLOSTOMY TAKEDOWN PRIMARY INCISIONAL HERNIA REPAIRS PRIMARY PARASTOMAL HERNIA REPAIR ROBOTIC LYSIS OF ADHESIONS X 90 MIN (1/2 CASE) RIGID PROCTOSCOPY   SURGEON:  Ardeth Sportsman, MD  OR FINDINGS:    Patient had moderately dense omental adhesions to anterior abdominal wall and interloop adhesions that were stretched out.  Numerous bands causing numerous internal hernias without any definite obstruction.   Parastomal hernia 8 x 7 cm primarily repaired.  Midline periumbilical incisional hernias 1.5 and 3 cm incarcerated omentum.  Reduced down and primarily repaired.  No mesh repair given colon case.   No obvious metastatic disease on visceral parietal peritoneum or liver.   The anastomosis rests 14 cm from the anal verge by rigid proctoscopy.   CASE DATA:   Type of patient?: Elective [facility]  Private Case   Status of Case? Elective Scheduled   Infection Present At Time Of Surgery (PATOS)?  NO  Assessment  Recovering  [facility] Stay = 1 days)  Plan:  -Enhanced recovery pathway  Advance diet.  Continue mobilization.  DC Foley.  Medlock IV fluids and follow.  History of DVT control with anticoagulation IVC filter since she refuses blood products.  As far as I can gather she should resist resume her anticoagulation and for the first month after surgery and then plan removal of IVC filter per vascular surgery.  Defer to them  VTE prophylaxis- SCDs, etc  mobilize as tolerated to help recovery  Disposition:  Disposition:  The patient is from: Home  Anticipate discharge to:  Home  Anticipated Date of Discharge is:  June 11,2022    Barriers to discharge:  Pending Clinical improvement (more likely than not)  Patient currently is NOT MEDICALLY STABLE for discharge from the hospital from a surgery standpoint.      25 minutes spent in review, evaluation, examination, counseling, and coordination of care.   I have reviewed this patient's available data, including medical history, events of note, physical examination and test results as part of my evaluation.  A significant portion of that time was spent in counseling.  Care during the described time interval was provided by me.  09/09/2020    Subjective: (Chief complaint)  Patient tolerating liquids.  Wants to try solids.  Already walking hallways.  Up in chair.  Pain mostly controlled.  Objective:  Vital signs:  Vitals:   09/08/20 1935 09/08/20 2032 09/09/20 0042 09/09/20 0511  BP: (!) 150/97 (!) 143/80 133/71 132/89  Pulse: 97 93 (!) 104 96  Resp: 17 17 17 18   Temp: 99.6 F (37.6 C) 99.7 F (37.6 C) 99.1 F (37.3 C) 98.4 F (36.9 C)  TempSrc: Oral Oral Oral Oral  SpO2: 99% 100% 97% 99%  Weight:    116.5 kg    Last BM Date: 09/06/20  Intake/Output   Yesterday:  06/08 0701 - 06/09  0700 In: 1971.1 [P.O.:240; I.V.:1631.1; IV Piggyback:100] Out: 2750 [Urine:2625; Blood:125] This shift:  Total I/O In: -  Out: 100 [Urine:100]  Bowel function:  Flatus: No  BM:  No  Drain: (No drain)   Physical Exam:  General: Pt awake/alert in no acute distress Eyes: PERRL, normal EOM.  Sclera clear.  No icterus Neuro: CN II-XII intact w/o focal sensory/motor deficits. Lymph: No head/neck/groin lymphadenopathy Psych:  No delerium/psychosis/paranoia.  Oriented x 4 HENT: Normocephalic, Mucus membranes moist.  No thrush Neck: Supple, No tracheal deviation.  No obvious thyromegaly Chest: No pain to chest wall compression.  Good respiratory excursion.  No audible wheezing CV:  Pulses intact.  Regular rhythm.  No major extremity edema MS: Normal AROM mjr joints.  No obvious deformity  Abdomen: Soft.  Nondistended.  Mildly tender at incisions only.  No evidence of peritonitis.  No incarcerated hernias.  Ext:  No deformity.  No mjr edema.  No cyanosis Skin: No petechiae / purpurea.  No major sores.  Warm and dry    Results:   Cultures: Recent Results (from the past 720 hour(s))  SARS CORONAVIRUS 2 (TAT 6-24 HRS) Nasopharyngeal Nasopharyngeal Swab     Status: None   Collection Time: 09/06/20  2:05 PM   Specimen: Nasopharyngeal Swab  Result Value Ref Range Status   SARS Coronavirus 2 NEGATIVE NEGATIVE Final    Comment: (NOTE) SARS-CoV-2 target nucleic acids are NOT DETECTED.  The SARS-CoV-2 RNA is generally detectable in upper and lower respiratory specimens during the acute phase of infection. Negative results do not preclude SARS-CoV-2 infection, do not rule out co-infections with other pathogens, and should not be used as the sole basis for treatment or other patient management decisions. Negative results must be combined with clinical observations, patient history, and epidemiological information. The expected result is Negative.  Fact Sheet for  Patients: 11/06/20  Fact Sheet for Healthcare Providers: HairSlick.no  This test is not yet approved or cleared by the quierodirigir.com FDA and  has been authorized for detection and/or diagnosis of SARS-CoV-2 by FDA under an Emergency Use Authorization (EUA). This EUA will remain  in effect (meaning this test can be used) for the duration of the COVID-19 declaration under Se ction 564(b)(1) of the Act, 21 U.S.C. section 360bbb-3(b)(1), unless the authorization is terminated or revoked sooner.  Performed at [facility], [address] [place], [place]     Labs: Results for orders placed or performed during the hospital encounter of 09/08/20 (from the past 48 hour(s))  Basic metabolic panel     Status: Abnormal   Collection Time: 09/09/20  4:33 AM  Result Value Ref Range   Sodium 138 135 - 145 mmol/L   Potassium 3.6 3.5 - 5.1 mmol/L   Chloride 103 98 - 111 mmol/L   CO2 25 22 - 32 mmol/L   Glucose, Bld 115 (H) 70 - 99 mg/dL    Comment: Glucose reference range applies only to samples taken after fasting for at least 8 hours.   BUN 10 6 - 20 mg/dL   Creatinine, Ser 11/09/20 0.44 -  1.00 mg/dL   Calcium 8.8 (L) 8.9 - 10.3 mg/dL   GFR, Estimated >81 >01 mL/min    Comment: (NOTE) Calculated using the CKD-EPI Creatinine Equation (2021)    Anion gap 10 5 - 15    Comment: Performed at [facility], [address], [place], [place] [address]  CBC     Status: Abnormal   Collection Time: 09/09/20  4:33 AM  Result Value Ref Range   WBC 13.9 (H) 4.0 - 10.5 K/uL   RBC 4.36 3.87 - 5.11 MIL/uL   Hemoglobin 11.8 (L) 12.0 - 15.0 g/dL   HCT 58.5 (L) 27.7 - 82.4 %   MCV 78.0 (L) 80.0 - 100.0 fL   MCH 27.1 26.0 - 34.0 pg   MCHC 34.7 30.0 - 36.0 g/dL   RDW 23.5 36.1 - 44.3 %   Platelets 181 150 - 400 K/uL   nRBC 0.0 0.0 - 0.2 %    Comment: Performed at [facility], [address], [place], [place] [address]  Magnesium     Status: None   Collection Time: 09/09/20  4:33 AM  Result Value Ref Range   Magnesium 2.0 1.7 - 2.4 mg/dL    Comment: Performed at [facility], [address], [place], [place] [address]    Imaging / Studies: No results found.  Medications / Allergies: per chart  Antibiotics: Anti-infectives (From admission, onward)    Start     Dose/Rate Route Frequency Ordered Stop   09/09/20 0100  cefoTEtan (CEFOTAN) 2 g in sodium chloride 0.9 % 100 mL IVPB        2 g 200 mL/hr over 30 Minutes Intravenous Every 12 hours 09/08/20 1658 09/09/20 0123   09/08/20 1400  neomycin (MYCIFRADIN) tablet 1,000 mg  Status:  Discontinued       See Hyperspace for full Linked Orders Report.   1,000 mg Oral 3 times per day 09/08/20 0926 09/08/20 0930   09/08/20 1400  metroNIDAZOLE (FLAGYL) tablet 1,000 mg  Status:  Discontinued       See Hyperspace for full Linked Orders Report.   1,000 mg Oral 3 times per day 09/08/20 0926 09/08/20 0930   09/08/20 0930  cefoTEtan (CEFOTAN) 2 g in sodium chloride 0.9 % 100 mL IVPB        2 g 200 mL/hr over 30 Minutes Intravenous On call to O.R. 09/08/20 0926 09/08/20 1346         Note: Portions of this report may have been transcribed using voice recognition software. Every effort was made to ensure accuracy; however, inadvertent computerized transcription errors may be present.   Any transcriptional errors that result from this process are unintentional.    Ardeth Sportsman, MD, FACS, MASCRS Esophageal, Gastrointestinal & Colorectal Surgery Robotic and Minimally Invasive Surgery  [facility] [address], [address] [place], [place] [address] [phone] Fax [phone] Main/Paging  CONTACT INFORMATION: Weekday (9AM-5PM) concerns: Call [facility] main office at [phone] Weeknight (5PM-9AM) or Weekend/Holiday concerns: Check [email or web address] for General Surgery [facility]  coverage (Please, do not use SecureChat as it is not reliable communication to operating surgeons for immediate patient care)      09/09/2020  7:56 AM

## 2020-09-10 LAB — SURGICAL PATHOLOGY

## 2020-09-10 MED ORDER — GABAPENTIN 100 MG PO CAPS
200.0000 mg | ORAL_CAPSULE | Freq: Three times a day (TID) | ORAL | Status: DC
  Administered 2020-09-10 – 2020-09-13 (×9): 200 mg via ORAL
  Filled 2020-09-10 (×9): qty 2

## 2020-09-10 MED ORDER — OXYCODONE HCL 5 MG PO TABS: 5.0000 mg | ORAL_TABLET | Freq: Four times a day (QID) | ORAL | 0 refills | Status: DC | PRN

## 2020-09-10 MED ORDER — OXYCODONE HCL 5 MG PO TABS
5.0000 mg | ORAL_TABLET | ORAL | Status: DC | PRN
  Administered 2020-09-10: 10 mg via ORAL
  Administered 2020-09-10: 5 mg via ORAL
  Administered 2020-09-10 – 2020-09-13 (×9): 10 mg via ORAL
  Filled 2020-09-10 (×3): qty 2
  Filled 2020-09-10: qty 1
  Filled 2020-09-10 (×9): qty 2

## 2020-09-10 NOTE — Progress Notes (Signed)
[name] [id number] [birth year]  CARE TEAM:  PCP: Soundra Pilon, FNP  Outpatient Care Team: Patient Care Team: Soundra Pilon, FNP as PCP - General (Family Medicine) Karie Soda, MD as Consulting Physician (Colon and Rectal Surgery) Nada Libman, MD as Consulting Physician (Vascular Surgery) Kerin Salen, MD as Consulting Physician (Gastroenterology)  Inpatient Treatment Team: Treatment Team: Attending Provider: Karie Soda, MD; Technician: Joline Salt, NT; Registered Nurse: Saddie Benders, RN; Utilization Review: Oval Linsey, RN   Problem List:   Principal Problem:   Diverticular disease of colon Active Problems:   Anemia, chronic disease   Body mass index (BMI) 40.0-44.9, adult (HCC)   Colostomy present (HCC)   Chronic pain   Mixed anxiety and depressive disorder   Refusal of blood transfusions as patient is Jehovah's Witness   Presence of IVC filter   Diverticulitis of sigmoid colon   2 Days Post-Op  09/08/2020  POST-OPERATIVE DIAGNOSIS:   COLOSTOMY FOR COLON RESECTION, DESIRE FOR OSTOMY TAKEDOWN PARASTOMAL HERNIA INCISIONAL HERNIAS, INCARCERATED   PROCEDURE:   ROBOTIC ASSISTED COLOSTOMY TAKEDOWN PRIMARY INCISIONAL HERNIA REPAIRS PRIMARY PARASTOMAL HERNIA REPAIR ROBOTIC LYSIS OF ADHESIONS X 90 MIN (1/2 CASE) RIGID PROCTOSCOPY   SURGEON:  Ardeth Sportsman, MD  OR FINDINGS:    Patient had moderately dense omental adhesions to anterior abdominal wall and interloop adhesions that were stretched out.  Numerous bands causing numerous internal hernias without any definite obstruction.   Parastomal hernia 8 x 7 cm primarily repaired.  Midline periumbilical incisional hernias 1.5 and 3 cm incarcerated omentum.  Reduced down and primarily repaired.  No mesh repair given colon case.   No obvious metastatic disease on visceral parietal peritoneum or liver.   The anastomosis rests 14 cm from the anal verge by rigid proctoscopy.   CASE DATA:    Type of patient?: Elective [facility] Private Case   Status of Case? Elective Scheduled   Infection Present At Time Of Surgery (PATOS)?  NO  Assessment  Recovering  [facility] Stay = 2 days)  Plan:  -Enhanced recovery pathway  Advance diet.  Continue mobilization.  DC Foley.  Medlock IV fluids and follow.  Improve pain control - switch to oxycodone PRN.  Add gabapentin.  Continue tylenol RTC & ice/heat.  Methocarbanol PRN  History of DVT control with anticoagulation IVC filter since she refuses blood products.  As far as I can gather she should resist resume her anticoagulation and for the first month after surgery and then plan removal of IVC filter per vascular surgery.  Defer to them  VTE prophylaxis- SCDs, etc  mobilize as tolerated to help recovery  Disposition:  Disposition:  The patient is from: Home  Anticipate discharge to:  Home  Anticipated Date of Discharge is:  June 11,2022    Barriers to discharge:  Pending Clinical improvement (more likely than not)  Patient currently is NOT MEDICALLY STABLE for discharge from the hospital from a surgery standpoint.      25 minutes spent in review, evaluation, examination, counseling, and coordination of care.   I have reviewed this patient's available data, including medical history, events of note, physical examination and test results as part of my evaluation.  A significant portion of that time was spent in counseling.  Care during the described time interval was provided by me.  09/10/2020    Subjective: (Chief complaint)  Patient tolerating liquids.  Wants to try solids.  Already walking hallways.  Up in chair.  Pain mostly  controlled.  Objective:  Vital signs:  Vitals:   09/09/20 1343 09/09/20 2116 09/10/20 0500 09/10/20 0559  BP: (!) 104/59 (!) 130/93  114/69  Pulse: 98 87  (!) 102  Resp: 18 18  18   Temp: 98.3 F (36.8 C) 98.5 F (36.9 C)  98.7 F (37.1 C)  TempSrc: Oral Oral  Oral  SpO2:  96% 100%  96%  Weight:   116.2 kg     Last BM Date: 09/06/20  Intake/Output   Yesterday:  06/09 0701 - 06/10 0700 In: 1048.3 [P.O.:540; I.V.:508.3] Out: 700 [Urine:700] This shift:  No intake/output data recorded.  Bowel function:  Flatus: No  BM:  No  Drain: (No drain)   Physical Exam:  General: Pt awake/alert in no acute distress Eyes: PERRL, normal EOM.  Sclera clear.  No icterus Neuro: CN II-XII intact w/o focal sensory/motor deficits. Lymph: No head/neck/groin lymphadenopathy Psych:  No delerium/psychosis/paranoia.  Oriented x 4 HENT: Normocephalic, Mucus membranes moist.  No thrush Neck: Supple, No tracheal deviation.  No obvious thyromegaly Chest: No pain to chest wall compression.  Good respiratory excursion.  No audible wheezing CV:  Pulses intact.  Regular rhythm.  No major extremity edema MS: Normal AROM mjr joints.  No obvious deformity  Abdomen: Soft.  Nondistended.  Mildly tender at incisions only.  No evidence of peritonitis.  No incarcerated hernias.  Ext:  No deformity.  No mjr edema.  No cyanosis Skin: No petechiae / purpurea.  No major sores.  Warm and dry    Results:   Cultures: Recent Results (from the past 720 hour(s))  SARS CORONAVIRUS 2 (TAT 6-24 HRS) Nasopharyngeal Nasopharyngeal Swab     Status: None   Collection Time: 09/06/20  2:05 PM   Specimen: Nasopharyngeal Swab  Result Value Ref Range Status   SARS Coronavirus 2 NEGATIVE NEGATIVE Final    Comment: (NOTE) SARS-CoV-2 target nucleic acids are NOT DETECTED.  The SARS-CoV-2 RNA is generally detectable in upper and lower respiratory specimens during the acute phase of infection. Negative results do not preclude SARS-CoV-2 infection, do not rule out co-infections with other pathogens, and should not be used as the sole basis for treatment or other patient management decisions. Negative results must be combined with clinical observations, patient history, and epidemiological  information. The expected result is Negative.  Fact Sheet for Patients: 11/06/20  Fact Sheet for Healthcare Providers: HairSlick.no  This test is not yet approved or cleared by the quierodirigir.com FDA and  has been authorized for detection and/or diagnosis of SARS-CoV-2 by FDA under an Emergency Use Authorization (EUA). This EUA will remain  in effect (meaning this test can be used) for the duration of the COVID-19 declaration under Se ction 564(b)(1) of the Act, 21 U.S.C. section 360bbb-3(b)(1), unless the authorization is terminated or revoked sooner.  Performed at [facility], [address], [place], [place]     Labs: Results for orders placed or performed during the hospital encounter of 09/08/20 (from the past 48 hour(s))  Basic metabolic panel     Status: Abnormal   Collection Time: 09/09/20  4:33 AM  Result Value Ref Range   Sodium 138 135 - 145 mmol/L   Potassium 3.6 3.5 - 5.1 mmol/L   Chloride 103 98 - 111 mmol/L   CO2 25 22 - 32 mmol/L   Glucose, Bld 115 (H) 70 - 99 mg/dL    Comment: Glucose reference range applies only to samples taken after fasting for at  least 8 hours.   BUN 10 6 - 20 mg/dL   Creatinine, Ser 3.26 0.44 - 1.00 mg/dL   Calcium 8.8 (L) 8.9 - 10.3 mg/dL   GFR, Estimated >71 >24 mL/min    Comment: (NOTE) Calculated using the CKD-EPI Creatinine Equation (2021)    Anion gap 10 5 - 15    Comment: Performed at [facility], [address], [place], [place] [address]  CBC     Status: Abnormal   Collection Time: 09/09/20  4:33 AM  Result Value Ref Range   WBC 13.9 (H) 4.0 - 10.5 K/uL   RBC 4.36 3.87 - 5.11 MIL/uL   Hemoglobin 11.8 (L) 12.0 - 15.0 g/dL   HCT 83.3 (L) 82.5 - 05.3 %   MCV 78.0 (L) 80.0 - 100.0 fL   MCH 27.1 26.0 - 34.0 pg   MCHC 34.7 30.0 - 36.0 g/dL   RDW 97.6 73.4 - 19.3 %   Platelets 181 150 - 400 K/uL   nRBC 0.0 0.0 - 0.2 %     Comment: Performed at [facility], [address], [place], [place] [address]  Magnesium     Status: None   Collection Time: 09/09/20  4:33 AM  Result Value Ref Range   Magnesium 2.0 1.7 - 2.4 mg/dL    Comment: Performed at [facility], [address] [place], [place] [address]    Imaging / Studies: No results found.  Medications / Allergies: per chart  Antibiotics: Anti-infectives (From admission, onward)    Start     Dose/Rate Route Frequency Ordered Stop   09/09/20 0100  cefoTEtan (CEFOTAN) 2 g in sodium chloride 0.9 % 100 mL IVPB        2 g 200 mL/hr over 30 Minutes Intravenous Every 12 hours 09/08/20 1658 09/09/20 0123   09/08/20 1400  neomycin (MYCIFRADIN) tablet 1,000 mg  Status:  Discontinued       See Hyperspace for full Linked Orders Report.   1,000 mg Oral 3 times per day 09/08/20 0926 09/08/20 0930   09/08/20 1400  metroNIDAZOLE (FLAGYL) tablet 1,000 mg  Status:  Discontinued       See Hyperspace for full Linked Orders Report.   1,000 mg Oral 3 times per day 09/08/20 0926 09/08/20 0930   09/08/20 0930  cefoTEtan (CEFOTAN) 2 g in sodium chloride 0.9 % 100 mL IVPB        2 g 200 mL/hr over 30 Minutes Intravenous On call to O.R. 09/08/20 0926 09/08/20 1346         Note: Portions of this report may have been transcribed using voice recognition software. Every effort was made to ensure accuracy; however, inadvertent computerized transcription errors may be present.   Any transcriptional errors that result from this process are unintentional.    Ardeth Sportsman, MD, FACS, MASCRS Esophageal, Gastrointestinal & Colorectal Surgery Robotic and Minimally Invasive Surgery  [facility] [address] [place], [place] [address] [phone] Fax [phone] Main/Paging  CONTACT INFORMATION: Weekday (9AM-5PM) concerns: Call [facility] main office at [phone] Weeknight (5PM-9AM) or Weekend/Holiday  concerns: Check [email or web address] for General Surgery [facility] coverage (Please, do not use SecureChat as it is not reliable communication to operating surgeons for immediate patient care)      09/10/2020  7:57 AM

## 2020-09-11 NOTE — Progress Notes (Signed)
[name] [id number] May 18, 1965  CARE TEAM:  PCP: Soundra Pilon, FNP  Outpatient Care Team: Patient Care Team: Soundra Pilon, FNP as PCP - General (Family Medicine) Karie Soda, MD as Consulting Physician (Colon and Rectal Surgery) Nada Libman, MD as Consulting Physician (Vascular Surgery) Kerin Salen, MD as Consulting Physician (Gastroenterology)  Inpatient Treatment Team: Treatment Team: Attending Provider: Karie Soda, MD; Utilization Review: Crutchfield, Derrill Memo, RN; Registered Nurse: Dina Rich, RN; Utilization Review: Jon Billings, RN; Case Manager: Shon Baton, RN   Problem List:   Principal Problem:   Diverticular disease of colon Active Problems:   Anemia, chronic disease   Body mass index (BMI) 40.0-44.9, adult (HCC)   Colostomy present (HCC)   Chronic pain   Mixed anxiety and depressive disorder   Refusal of blood transfusions as patient is Jehovah's Witness   Presence of IVC filter   Diverticulitis of sigmoid colon   3 Days Post-Op  09/08/2020  POST-OPERATIVE DIAGNOSIS:   COLOSTOMY FOR COLON RESECTION, DESIRE FOR OSTOMY TAKEDOWN PARASTOMAL HERNIA INCISIONAL HERNIAS, INCARCERATED   PROCEDURE:   ROBOTIC ASSISTED COLOSTOMY TAKEDOWN PRIMARY INCISIONAL HERNIA REPAIRS PRIMARY PARASTOMAL HERNIA REPAIR ROBOTIC LYSIS OF ADHESIONS X 90 MIN (1/2 CASE) RIGID PROCTOSCOPY   SURGEON:  Ardeth Sportsman, MD  OR FINDINGS:    Patient had moderately dense omental adhesions to anterior abdominal wall and interloop adhesions that were stretched out.  Numerous bands causing numerous internal hernias without any definite obstruction.   Parastomal hernia 8 x 7 cm primarily repaired.  Midline periumbilical incisional hernias 1.5 and 3 cm incarcerated omentum.  Reduced down and primarily repaired.  No mesh repair given colon case.   No obvious metastatic disease on visceral parietal peritoneum or liver.   The anastomosis rests 14 cm from  the anal verge by rigid proctoscopy.   CASE DATA:   Type of patient?: Elective [facility] Private Case   Status of Case? Elective Scheduled   Infection Present At Time Of Surgery (PATOS)?  NO  Assessment  Recovering  [facility] Stay = 3 days)  Plan:  -Enhanced recovery pathway  HH diet as tolerated.   Continue mobilization.  Improve pain control - oxycodone PRN.  TID gabapentin.  Continue tylenol TID & ice/heat.  Methocarbamol PRN oral or IV.  PRN dilaudid IV for breakthrough.   History of DVT control with anticoagulation IVC filter since she refuses blood products.  As far as I can gather she should resist resume her anticoagulation and for the first month after surgery and then plan removal of IVC filter per vascular surgery.  Defer to them  VTE prophylaxis- SCDs, etc   Disposition:  Disposition:  The patient is from: Home  Anticipate discharge to:  Home  Anticipated Date of Discharge is:  June 12 ,2022    Barriers to discharge:  needs better oral pain control.    Patient currently is NOT MEDICALLY STABLE for discharge from the hospital from a surgery standpoint.      09/11/2020    Subjective: (Chief complaint) Pt had issues overnight with pain control.  She is doing ok with diet and had several BMs.  She denies n/v.  She is ambulatory and urinating spontaneously.    Objective:  Vital signs:  Vitals:   09/10/20 1309 09/10/20 2106 09/11/20 0500 09/11/20 0511  BP: 116/79 114/70  132/84  Pulse: 100 (!) 101  90  Resp: 17 18  18   Temp: 98.8 F (37.1 C) 99.3 F (37.4  C)  98.5 F (36.9 C)  TempSrc:  Oral    SpO2: 93% 95%  100%  Weight:   116.3 kg     Last BM Date: 09/06/20  Intake/Output   Yesterday:  06/10 0701 - 06/11 0700 In: 360 [P.O.:360] Out: 1000 [Urine:1000] This shift:  Total I/O In: 90 [P.O.:90] Out: -   Bowel function:  Flatus: No  BM:  No  Drain: (No drain)   Physical Exam:  General: Pt awake/alert in no acute distress Eyes:  PERRL, normal EOM.  Sclera clear.  No icterus Psych:  No delerium/psychosis/paranoia.  Oriented x 4 Chest: No pain to chest wall compression.  Good respiratory excursion.  No audible wheezing CV:  Pulses intact.  Regular rhythm.  No major extremity edema Abdomen: Soft.  Nondistended.  Mildly tender at incisions only.  Robotic port site dressings removed. No evidence of peritonitis.  No incarcerated hernias. Ext:  No deformity.  No mjr edema.  No cyanosis Skin: No petechiae / purpurea.  No major sores.  Warm and dry    Results:   Cultures: Recent Results (from the past 720 hour(s))  SARS CORONAVIRUS 2 (TAT 6-24 HRS) Nasopharyngeal Nasopharyngeal Swab     Status: None   Collection Time: 09/06/20  2:05 PM   Specimen: Nasopharyngeal Swab  Result Value Ref Range Status   SARS Coronavirus 2 NEGATIVE NEGATIVE Final    Comment: (NOTE) SARS-CoV-2 target nucleic acids are NOT DETECTED.  The SARS-CoV-2 RNA is generally detectable in upper and lower respiratory specimens during the acute phase of infection. Negative results do not preclude SARS-CoV-2 infection, do not rule out co-infections with other pathogens, and should not be used as the sole basis for treatment or other patient management decisions. Negative results must be combined with clinical observations, patient history, and epidemiological information. The expected result is Negative.  Fact Sheet for Patients: HairSlick.no  Fact Sheet for Healthcare Providers: quierodirigir.com  This test is not yet approved or cleared by the [place] FDA and  has been authorized for detection and/or diagnosis of SARS-CoV-2 by FDA under an Emergency Use Authorization (EUA). This EUA will remain  in effect (meaning this test can be used) for the duration of the COVID-19 declaration under Se ction 564(b)(1) of the Act, 21 U.S.C. section 360bbb-3(b)(1), unless the authorization is  terminated or revoked sooner.  Performed at [facility], [address] [place], [place] [address]     Labs: No results found for this or any previous visit (from the past 48 hour(s)).   Imaging / Studies: No results found.  Medications / Allergies: per chart  Antibiotics: Anti-infectives (From admission, onward)    Start     Dose/Rate Route Frequency Ordered Stop   09/09/20 0100  cefoTEtan (CEFOTAN) 2 g in sodium chloride 0.9 % 100 mL IVPB        2 g 200 mL/hr over 30 Minutes Intravenous Every 12 hours 09/08/20 1658 09/09/20 0123   09/08/20 1400  neomycin (MYCIFRADIN) tablet 1,000 mg  Status:  Discontinued       See Hyperspace for full Linked Orders Report.   1,000 mg Oral 3 times per day 09/08/20 0926 09/08/20 0930   09/08/20 1400  metroNIDAZOLE (FLAGYL) tablet 1,000 mg  Status:  Discontinued       See Hyperspace for full Linked Orders Report.   1,000 mg Oral 3 times per day 09/08/20 0926 09/08/20 0930   09/08/20 0930  cefoTEtan (CEFOTAN) 2 g in sodium chloride  0.9 % 100 mL IVPB        2 g 200 mL/hr over 30 Minutes Intravenous On call to O.R. 09/08/20 0926 09/08/20 1346         Note: Portions of this report may have been transcribed using voice recognition software. Every effort was made to ensure accuracy; however, inadvertent computerized transcription errors may be present.   Any transcriptional errors that result from this process are unintentional.    Maudry Diego, MD [facility] Surgical Oncology, General Surgery, Trauma and Critical [facility], [place] [phone] for weekday/non holidays Check [email or web address] for coverage night/weekend/holidays  Do not use SecureChat as it is not reliable for timely patient care.

## 2020-09-12 LAB — URINALYSIS, COMPLETE (UACMP) WITH MICROSCOPIC
Bacteria, UA: NONE SEEN
Bilirubin Urine: NEGATIVE
Glucose, UA: NEGATIVE mg/dL
Hgb urine dipstick: NEGATIVE
Ketones, ur: NEGATIVE mg/dL
Leukocytes,Ua: NEGATIVE
Nitrite: NEGATIVE
Protein, ur: NEGATIVE mg/dL
Specific Gravity, Urine: 1.005 (ref 1.005–1.030)
pH: 9 — ABNORMAL HIGH (ref 5.0–8.0)

## 2020-09-12 NOTE — Plan of Care (Signed)
  Problem: Clinical Measurements: Goal: Ability to maintain clinical measurements within normal limits will improve Outcome: Progressing   Problem: Clinical Measurements: Goal: Postoperative complications will be avoided or minimized Outcome: Progressing   Problem: Skin Integrity: Goal: Demonstration of wound healing without infection will improve Outcome: Progressing   

## 2020-09-12 NOTE — Progress Notes (Signed)
[name] [id number] 1965/11/13  CARE TEAM:  PCP: Soundra Pilon, FNP  Outpatient Care Team: Patient Care Team: Soundra Pilon, FNP as PCP - General (Family Medicine) Karie Soda, MD as Consulting Physician (Colon and Rectal Surgery) Nada Libman, MD as Consulting Physician (Vascular Surgery) Kerin Salen, MD as Consulting Physician (Gastroenterology)  Inpatient Treatment Team: Treatment Team: Attending Provider: Karie Soda, MD; Utilization Review: Crutchfield, Derrill Memo, RN; Registered Nurse: Sherian Maroon, RN; Social Worker: Ewing Schlein, LCSW   Problem List:   Principal Problem:   Diverticular disease of colon Active Problems:   Anemia, chronic disease   Body mass index (BMI) 40.0-44.9, adult (HCC)   Colostomy present (HCC)   Chronic pain   Mixed anxiety and depressive disorder   Refusal of blood transfusions as patient is Jehovah's Witness   Presence of IVC filter   Diverticulitis of sigmoid colon   4 Days Post-Op  09/08/2020  POST-OPERATIVE DIAGNOSIS:   COLOSTOMY FOR COLON RESECTION, DESIRE FOR OSTOMY TAKEDOWN PARASTOMAL HERNIA INCISIONAL HERNIAS, INCARCERATED   PROCEDURE:   ROBOTIC ASSISTED COLOSTOMY TAKEDOWN PRIMARY INCISIONAL HERNIA REPAIRS PRIMARY PARASTOMAL HERNIA REPAIR ROBOTIC LYSIS OF ADHESIONS X 90 MIN (1/2 CASE) RIGID PROCTOSCOPY   SURGEON:  Ardeth Sportsman, MD  OR FINDINGS:    Patient had moderately dense omental adhesions to anterior abdominal wall and interloop adhesions that were stretched out.  Numerous bands causing numerous internal hernias without any definite obstruction.   Parastomal hernia 8 x 7 cm primarily repaired.  Midline periumbilical incisional hernias 1.5 and 3 cm incarcerated omentum.  Reduced down and primarily repaired.  No mesh repair given colon case.   No obvious metastatic disease on visceral parietal peritoneum or liver.   The anastomosis rests 14 cm from the anal verge by rigid proctoscopy.    CASE DATA:   Type of patient?: Elective [facility] Private Case   Status of Case? Elective Scheduled   Infection Present At Time Of Surgery (PATOS)?  NO  Assessment  Recovering  [facility] Stay = 4 days)  Plan:  -Enhanced recovery pathway  HH diet as tolerated.  Had BM today Work on bowel control  Get u/a for burning with urination. Recheck WBCs in AM.   Continue mobilization.  Pain control - oxycodone PRN.  TID gabapentin.  Continue tylenol TID & ice/heat.   PRN dilaudid IV for breakthrough. Still requiring some IV breakthrough pain meds.   History of DVT control with anticoagulation IVC filter since she refuses blood products.  Note per Dr. Geoffery Lyons far as I can gather she should resist resume her anticoagulation and for the first month after surgery and then plan removal of IVC filter per vascular surgery.  Defer to them]  VTE prophylaxis- SCDs, etc   Disposition:  Disposition:  The patient is from: Home  Anticipate discharge to:  Home  Anticipated Date of Discharge is:  June 13 ,2022    Barriers to discharge:  needs better oral pain control.    Patient currently is NOT MEDICALLY STABLE for discharge from the hospital from a surgery standpoint.      09/12/2020    Subjective: (Chief complaint) Pt had large BM today, but now constantly feels like she needs to have a BM.  It was a bit tarry.  She complains of burning with urination.  She is still needing IV dilaudid.    Objective:  Vital signs:  Vitals:   09/11/20 0511 09/11/20 1259 09/11/20 2148 09/12/20 0611  BP: 132/84  125/78 115/70 116/81  Pulse: 90 97 91 99  Resp: 18 18 17 17   Temp: 98.5 F (36.9 C) 98.9 F (37.2 C) 98.4 F (36.9 C) 99.2 F (37.3 C)  TempSrc:  Oral Oral Oral  SpO2: 100% 95% 97% 98%  Weight:        Last BM Date: 09/11/20  Intake/Output   Yesterday:  06/11 0701 - 06/12 0700 In: 90 [P.O.:90] Out: 1000 [Urine:1000] This shift:  No intake/output data recorded.  Bowel  function:  Flatus: No  BM:  No  Drain: (No drain)   Physical Exam:  General: Pt awake/alert in no acute distress Eyes: PERRL, normal EOM.  Sclera clear.  No icterus Psych:  No delerium/psychosis/paranoia.  Oriented x 4 Chest: breathing comfortably CV:  Pulses intact.  No major extremity edema Abdomen: Soft.  Nondistended.  Mildly tender at incisions only.  Robotic port site dressings removed. No evidence of peritonitis.  Wicks removed from former ostomy site.  No evidence of infection. Ext:  No deformity.  No mjr edema.  No cyanosis Skin: No petechiae / purpurea.  No major sores.  Warm and dry    Results:   Cultures: Recent Results (from the past 720 hour(s))  SARS CORONAVIRUS 2 (TAT 6-24 HRS) Nasopharyngeal Nasopharyngeal Swab     Status: None   Collection Time: 09/06/20  2:05 PM   Specimen: Nasopharyngeal Swab  Result Value Ref Range Status   SARS Coronavirus 2 NEGATIVE NEGATIVE Final    Comment: (NOTE) SARS-CoV-2 target nucleic acids are NOT DETECTED.  The SARS-CoV-2 RNA is generally detectable in upper and lower respiratory specimens during the acute phase of infection. Negative results do not preclude SARS-CoV-2 infection, do not rule out co-infections with other pathogens, and should not be used as the sole basis for treatment or other patient management decisions. Negative results must be combined with clinical observations, patient history, and epidemiological information. The expected result is Negative.  Fact Sheet for Patients: 11/06/20  Fact Sheet for Healthcare Providers: HairSlick.no  This test is not yet approved or cleared by the [place]ir[place]ir.com FDA and  has been authorized for detection and/or diagnosis of SARS-CoV-2 by FDA under an Emergency Use Authorization (EUA). This EUA will remain  in effect (meaning this test can be used) for the duration of the COVID-19 declaration under Se ction  564(b)(1) of the Act, 21 U.S.C. section 360bbb-3(b)(1), unless the authorization is terminated or revoked sooner.  Performed at [facility], [address] [place], [place]     Labs: No results found for this or any previous visit (from the past 48 hour(s)).   Imaging / Studies: No results found.  Medications / Allergies: per chart  Antibiotics: Anti-infectives (From admission, onward)    Start     Dose/Rate Route Frequency Ordered Stop   09/09/20 0100  cefoTEtan (CEFOTAN) 2 g in sodium chloride 0.9 % 100 mL IVPB        2 g 200 mL/hr over 30 Minutes Intravenous Every 12 hours 09/08/20 1658 09/09/20 0123   09/08/20 1400  neomycin (MYCIFRADIN) tablet 1,000 mg  Status:  Discontinued       See Hyperspace for full Linked Orders Report.   1,000 mg Oral 3 times per day 09/08/20 0926 09/08/20 0930   09/08/20 1400  metroNIDAZOLE (FLAGYL) tablet 1,000 mg  Status:  Discontinued       See Hyperspace for full Linked Orders Report.   1,000 mg Oral 3 times per day 09/08/20  3736 09/08/20 0930   09/08/20 0930  cefoTEtan (CEFOTAN) 2 g in sodium chloride 0.9 % 100 mL IVPB        2 g 200 mL/hr over 30 Minutes Intravenous On call to O.R. 09/08/20 0926 09/08/20 1346         Note: Portions of this report may have been transcribed using voice recognition software. Every effort was made to ensure accuracy; however, inadvertent computerized transcription errors may be present.   Any transcriptional errors that result from this process are unintentional.    Maudry Diego, MD [facility] Surgical Oncology, General Surgery, Trauma and Critical [facility], [place] [phone] for weekday/non holidays Check [email or web address] for coverage night/weekend/holidays  Do not use SecureChat as it is not reliable for timely patient care.

## 2020-09-12 NOTE — Plan of Care (Signed)
  Problem: Education: Goal: Required Educational Video(s) Outcome: Progressing   Problem: Clinical Measurements: Goal: Ability to maintain clinical measurements within normal limits will improve Outcome: Progressing Goal: Postoperative complications will be avoided or minimized Outcome: Progressing   Problem: Skin Integrity: Goal: Demonstration of wound healing without infection will improve Outcome: Progressing   

## 2020-09-13 LAB — CBC
HCT: 34.2 % — ABNORMAL LOW (ref 36.0–46.0)
Hemoglobin: 11.7 g/dL — ABNORMAL LOW (ref 12.0–15.0)
MCH: 27.1 pg (ref 26.0–34.0)
MCHC: 34.2 g/dL (ref 30.0–36.0)
MCV: 79.2 fL — ABNORMAL LOW (ref 80.0–100.0)
Platelets: 211 10*3/uL (ref 150–400)
RBC: 4.32 MIL/uL (ref 3.87–5.11)
RDW: 14.3 % (ref 11.5–15.5)
WBC: 6.7 10*3/uL (ref 4.0–10.5)
nRBC: 0 % (ref 0.0–0.2)

## 2020-09-13 LAB — BASIC METABOLIC PANEL
Anion gap: 6 (ref 5–15)
BUN: 9 mg/dL (ref 6–20)
CO2: 31 mmol/L (ref 22–32)
Calcium: 8.6 mg/dL — ABNORMAL LOW (ref 8.9–10.3)
Chloride: 101 mmol/L (ref 98–111)
Creatinine, Ser: 0.61 mg/dL (ref 0.44–1.00)
GFR, Estimated: >60 mL/min (ref >60)
Glucose, Bld: 102 mg/dL — ABNORMAL HIGH (ref 70–99)
Potassium: 3.5 mmol/L (ref 3.5–5.1)
Sodium: 138 mmol/L (ref 135–145)

## 2020-09-13 NOTE — Progress Notes (Signed)
Reviewed written d/c instructions w pt and all questions answered. Pt verbalized understanding. D/c ambulatory w all belongings in stable condition. 

## 2020-09-13 NOTE — Discharge Summary (Signed)
Physician Discharge Summary    Patient ID: [name] MRN: [id number] DOB/AGE: 56-Sep-1967  56 y.o.  Patient Care Team: Soundra Pilon, FNP as PCP - General (Family Medicine) Karie Soda, MD as Consulting Physician (Colon and Rectal Surgery) Nada Libman, MD as Consulting Physician (Vascular Surgery) Kerin Salen, MD as Consulting Physician (Gastroenterology)  Admit date: 09/08/2020  Discharge date: 09/13/2020  Hospital Stay = 5 days    Discharge Diagnoses:  Principal Problem:   Diverticular disease of colon Active Problems:   Anemia, chronic disease   Body mass index (BMI) 40.0-44.9, adult (HCC)   Colostomy present (HCC)   Chronic pain   Mixed anxiety and depressive disorder   Refusal of blood transfusions as patient is Jehovah's Witness   Presence of IVC filter   Diverticulitis of sigmoid colon   5 Days Post-Op  09/08/2020  POST-OPERATIVE DIAGNOSIS:   COLOSTOMY FOR COLON RESECTION, DESIRE FOR OSTOMY TAKEDOWN  SURGERY:  09/08/2020  POST-OPERATIVE DIAGNOSIS:   COLOSTOMY FOR COLON RESECTION, DESIRE FOR OSTOMY TAKEDOWN PARASTOMAL HERNIA INCISIONAL HERNIAS, INCARCERATED   PROCEDURE:   ROBOTIC ASSISTED COLOSTOMY TAKEDOWN PRIMARY INCISIONAL HERNIA REPAIRS PRIMARY PARASTOMAL HERNIA REPAIR ROBOTIC LYSIS OF ADHESIONS X 90 MIN (1/2 CASE) RIGID PROCTOSCOPY   SURGEON:  Ardeth Sportsman, MD   OR FINDINGS:    Patient had moderately dense omental adhesions to anterior abdominal wall and interloop adhesions that were stretched out.  Numerous bands causing numerous internal hernias without any definite obstruction.   Parastomal hernia 8 x 7 cm primarily repaired.  Midline periumbilical incisional hernias 1.5 and 3 cm incarcerated omentum.  Reduced down and primarily repaired.  No mesh repair given colon case.   No obvious metastatic disease on visceral parietal peritoneum or liver.   The anastomosis rests 14 cm from the anal verge by rigid proctoscopy.   CASE  DATA:   Type of patient?: Elective [facility] Private Case   Status of Case? Elective Scheduled   Infection Present At Time Of Surgery (PATOS)?  NO  SURGEON:    Surgeon(s): Karie Soda, MD Romie Levee, MD Rene Paci, MD  Consults: None  Hospital Course:   The patient underwent the surgery above.  Postoperatively, the patient gradually mobilized and advanced to a solid diet.  Pain and other symptoms were treated aggressively.    By the time of discharge, the patient was walking well the hallways, eating food, having flatus.  Pain was well-controlled on an oral medications.  Based on meeting discharge criteria and continuing to recover, I felt it was safe for the patient to be discharged from the hospital to further recover with close followup. Postoperative recommendations were discussed in detail.  They are written as well.  Discharged Condition: good  Discharge Exam: Blood pressure 133/77, pulse 89, temperature (!) 97.5 F (36.4 C), temperature source Oral, resp. rate 17, weight 119.8 kg, SpO2 100 %.  General: Pt awake/alert/oriented x4 in No acute distress Eyes: PERRL, normal EOM.  Sclera clear.  No icterus Neuro: CN II-XII intact w/o focal sensory/motor deficits. Lymph: No head/neck/groin lymphadenopathy Psych:  No delerium/psychosis/paranoia HENT: Normocephalic, Mucus membranes moist.  No thrush Neck: Supple, No tracheal deviation Chest: No chest wall pain w good excursion CV:  Pulses intact.  Regular rhythm MS: Normal AROM mjr joints.  No obvious deformity Abdomen: Soft.  Nondistended.  Mildly tender at incisions only.  Old colostomy incision with closed healing ridge - no cellulitis/hematoma.  No evidence of peritonitis.  No incarcerated hernias. Ext:  SCDs BLE.  No mjr edema.  No cyanosis Skin: No petechiae / purpura   Disposition:    Follow-up Information     Karie Soda, MD. Schedule an appointment as soon as possible for a visit in 3 weeks.    Specialties: General Surgery, Colon and Rectal Surgery Why: To follow up after your operation, To follow up after your hospital stay Contact information: [address] [place] [address] [phone]                 Discharge disposition: 01-Home or Self Care       Discharge Instructions     Call MD for:   Complete by: As directed    FEVER > 101.5 F  (temperatures < 101.5 F are not significant)   Call MD for:   Complete by: As directed    FEVER > 101.5 F  (temperatures < 101.5 F are not significant)   Call MD for:  extreme fatigue   Complete by: As directed    Call MD for:  extreme fatigue   Complete by: As directed    Call MD for:  persistant dizziness or light-headedness   Complete by: As directed    Call MD for:  persistant dizziness or light-headedness   Complete by: As directed    Call MD for:  persistant nausea and vomiting   Complete by: As directed    Call MD for:  persistant nausea and vomiting   Complete by: As directed    Call MD for:  redness, tenderness, or signs of infection (pain, swelling, redness, odor or green/yellow discharge around incision site)   Complete by: As directed    Call MD for:  redness, tenderness, or signs of infection (pain, swelling, redness, odor or green/yellow discharge around incision site)   Complete by: As directed    Call MD for:  severe uncontrolled pain   Complete by: As directed    Call MD for:  severe uncontrolled pain   Complete by: As directed    Diet - low sodium heart healthy   Complete by: As directed    Start with a bland diet such as soups, liquids, starchy foods, low fat foods, etc. the first few days at home. Gradually advance to a solid, low-fat, high fiber diet by the end of the first week at home.   Add a fiber supplement to your diet (Metamucil, etc) If you feel full, bloated, or constipated, stay on a full liquid or pureed/blenderized diet for a few days until you feel better and  are no longer constipated.   Discharge instructions   Complete by: As directed    See Discharge Instructions If you are not getting better after two weeks or are noticing you are getting worse, contact our office [phone] for further advice.  We may need to adjust your medications, re-evaluate you in the office, send you to the emergency room, or see what other things we can do to help. The clinic staff is available to answer your questions during regular business hours (8:30am-5pm).  Please don't hesitate to call and ask to speak to one of our nurses for clinical concerns.    A surgeon from [facility] is always on call at the hospitals 24 hours/day If you have a medical emergency, go to the nearest emergency room or call 911.   Discharge instructions   Complete by: As directed    See Discharge Instructions If you are not getting better after  two weeks or are noticing you are getting worse, contact our office [phone] for further advice.  We may need to adjust your medications, re-evaluate you in the office, send you to the emergency room, or see what other things we can do to help. The clinic staff is available to answer your questions during regular business hours (8:30am-5pm).  Please don't hesitate to call and ask to speak to one of our nurses for clinical concerns.    A surgeon from [facility] is always on call at the hospitals 24 hours/day If you have a medical emergency, go to the nearest emergency room or call 911.   Discharge wound care:   Complete by: As directed    It is good for closed incisions and even open wounds to be washed every day.  Shower every day.  Short baths are fine.  Wash the incisions and wounds clean with soap & water.    You may leave closed incisions open to air if it is dry.   You may cover the incision with clean gauze & replace it after your daily shower for comfort.  TEGADERM:  You have clear gauze band-aid dressings over  your closed incision(s).  Remove the dressings 3 days after surgery.   Discharge wound care:   Complete by: As directed    It is good for closed incisions and even open wounds to be washed every day.  Shower every day.  Short baths are fine.  Wash the incisions and wounds clean with soap & water.    You may leave closed incisions open to air if it is dry.   You may cover the incision with clean gauze & replace it after your daily shower for comfort.   Driving Restrictions   Complete by: As directed    You may drive when: - you are no longer taking narcotic prescription pain medication - you can comfortably wear a seatbelt - you can safely make sudden turns/stops without pain.   Driving Restrictions   Complete by: As directed    You may drive when: - you are no longer taking narcotic prescription pain medication - you can comfortably wear a seatbelt - you can safely make sudden turns/stops without pain.   Increase activity slowly   Complete by: As directed    Start light daily activities --- self-care, walking, climbing stairs- beginning the day after surgery.  Gradually increase activities as tolerated.  Control your pain to be active.  Stop when you are tired.  Ideally, walk several times a day, eventually an hour a day.   Most people are back to most day-to-day activities in a few weeks.  It takes 4-6 weeks to get back to unrestricted, intense activity. If you can walk 30 minutes without difficulty, it is safe to try more intense activity such as jogging, treadmill, bicycling, low-impact aerobics, swimming, etc. Save the most intensive and strenuous activity for last (Usually 4-8 weeks after surgery) such as sit-ups, heavy lifting, contact sports, etc.  Refrain from any intense heavy lifting or straining until you are off narcotics for pain control.  You will have off days, but things should improve week-by-week. DO NOT PUSH THROUGH PAIN.  Let pain be your guide: If it hurts to do something,  don't do it.   Increase activity slowly   Complete by: As directed    Start light daily activities --- self-care, walking, climbing stairs- beginning the day after surgery.  Gradually increase activities as tolerated.  Control your pain to be active.  Stop when you are tired.  Ideally, walk several times a day, eventually an hour a day.   Most people are back to most day-to-day activities in a few weeks.  It takes 4-6 weeks to get back to unrestricted, intense activity. If you can walk 30 minutes without difficulty, it is safe to try more intense activity such as jogging, treadmill, bicycling, low-impact aerobics, swimming, etc. Save the most intensive and strenuous activity for last (Usually 4-8 weeks after surgery) such as sit-ups, heavy lifting, contact sports, etc.  Refrain from any intense heavy lifting or straining until you are off narcotics for pain control.  You will have off days, but things should improve week-by-week. DO NOT PUSH THROUGH PAIN.  Let pain be your guide: If it hurts to do something, don't do it.   Lifting restrictions   Complete by: As directed    If you can walk 30 minutes without difficulty, it is safe to try more intense activity such as jogging, treadmill, bicycling, low-impact aerobics, swimming, etc. Save the most intensive and strenuous activity for last (Usually 4-8 weeks after surgery) such as sit-ups, heavy lifting, contact sports, etc.   Refrain from any intense heavy lifting or straining until you are off narcotics for pain control.  You will have off days, but things should improve week-by-week. DO NOT PUSH THROUGH PAIN.  Let pain be your guide: If it hurts to do something, don't do it.  Pain is your body warning you to avoid that activity for another week until the pain goes down.   Lifting restrictions   Complete by: As directed    If you can walk 30 minutes without difficulty, it is safe to try more intense activity such as jogging, treadmill, bicycling,  low-impact aerobics, swimming, etc. Save the most intensive and strenuous activity for last (Usually 4-8 weeks after surgery) such as sit-ups, heavy lifting, contact sports, etc.   Refrain from any intense heavy lifting or straining until you are off narcotics for pain control.  You will have off days, but things should improve week-by-week. DO NOT PUSH THROUGH PAIN.  Let pain be your guide: If it hurts to do something, don't do it.  Pain is your body warning you to avoid that activity for another week until the pain goes down.   May shower / Bathe   Complete by: As directed    May shower / Bathe   Complete by: As directed    May walk up steps   Complete by: As directed    May walk up steps   Complete by: As directed    Remove dressing in 72 hours   Complete by: As directed    Make sure all dressings are removed by the third day after surgery = 6/11 SATURDAY.  Leave incisions open to air.  OK to cover incisions with gauze or bandages as desired   Remove dressing in 72 hours   Complete by: As directed    Make sure all dressings are removed by the third day after surgery.  Leave incisions open to air.  OK to cover incisions with gauze or bandages as desired   Sexual Activity Restrictions   Complete by: As directed    You may have sexual intercourse when it is comfortable. If it hurts to do something, stop.   Sexual Activity Restrictions   Complete by: As directed    You may have sexual intercourse when it is comfortable.  If it hurts to do something, stop.       Allergies as of 09/13/2020       Reactions   Other    Blood refusal   Aspirin Diarrhea   stomach ache.         Medication List     STOP taking these medications    saccharomyces boulardii 250 MG capsule Commonly known as: FLORASTOR       TAKE these medications    ALPRAZolam 0.5 MG tablet Commonly known as: XANAX Take 0.5 mg by mouth 3 (three) times daily as needed for anxiety.   ferrous sulfate 325 (65 FE)  MG tablet Take 1 tablet (325 mg total) by mouth 2 (two) times daily with a meal.   losartan-hydrochlorothiazide 50-12.5 MG tablet Commonly known as: HYZAAR Take 1 tablet by mouth daily.   methocarbamol 500 MG tablet Commonly known as: ROBAXIN Take 1 tablet (500 mg total) by mouth every 8 (eight) hours as needed for muscle spasms.   multivitamin with minerals Tabs tablet Take 1 tablet by mouth 2 (two) times a week.   oxyCODONE 5 MG immediate release tablet Commonly known as: Oxy IR/ROXICODONE Take 1-2 tablets (5-10 mg total) by mouth every 6 (six) hours as needed for moderate pain, severe pain or breakthrough pain.   polyethylene glycol 17 g packet Commonly known as: MIRALAX / GLYCOLAX Take 17 g by mouth daily as needed for mild constipation.   promethazine 25 MG tablet Commonly known as: PHENERGAN Take 25 mg by mouth every 12 (twelve) hours as needed for nausea or vomiting.   Xarelto 20 MG Tabs tablet Generic drug: rivaroxaban TAKE 1 TABLET BY MOUTH DAILY WITH SUPPER.               Discharge Care Instructions  (From admission, onward)           Start     Ordered   09/13/20 0000  Discharge wound care:       Comments: It is good for closed incisions and even open wounds to be washed every day.  Shower every day.  Short baths are fine.  Wash the incisions and wounds clean with soap & water.    You may leave closed incisions open to air if it is dry.   You may cover the incision with clean gauze & replace it after your daily shower for comfort.   09/13/20 0751   09/08/20 0000  Discharge wound care:       Comments: It is good for closed incisions and even open wounds to be washed every day.  Shower every day.  Short baths are fine.  Wash the incisions and wounds clean with soap & water.    You may leave closed incisions open to air if it is dry.   You may cover the incision with clean gauze & replace it after your daily shower for comfort.  TEGADERM:  You have clear  gauze band-aid dressings over your closed incision(s).  Remove the dressings 3 days after surgery.   09/08/20 1243            Significant Diagnostic Studies:  Results for orders placed or performed during the hospital encounter of 09/08/20 (from the past 72 hour(s))  Urinalysis, Complete w Microscopic Urine, Clean Catch     Status: Abnormal   Collection Time: 09/12/20 10:40 PM  Result Value Ref Range   Color, Urine STRAW (A) YELLOW   APPearance CLEAR CLEAR   Specific Gravity, Urine  1.005 1.005 - 1.030   pH 9.0 (H) 5.0 - 8.0   Glucose, UA NEGATIVE NEGATIVE mg/dL   Hgb urine dipstick NEGATIVE NEGATIVE   Bilirubin Urine NEGATIVE NEGATIVE   Ketones, ur NEGATIVE NEGATIVE mg/dL   Protein, ur NEGATIVE NEGATIVE mg/dL   Nitrite NEGATIVE NEGATIVE   Leukocytes,Ua NEGATIVE NEGATIVE   RBC / HPF 0-5 0 - 5 RBC/hpf   WBC, UA 0-5 0 - 5 WBC/hpf   Bacteria, UA NONE SEEN NONE SEEN    Comment: Performed at [facility], [address], [place], [place] [address]  CBC     Status: Abnormal   Collection Time: 09/13/20  4:27 AM  Result Value Ref Range   WBC 6.7 4.0 - 10.5 K/uL   RBC 4.32 3.87 - 5.11 MIL/uL   Hemoglobin 11.7 (L) 12.0 - 15.0 g/dL   HCT 91.4 (L) 78.2 - 95.6 %   MCV 79.2 (L) 80.0 - 100.0 fL   MCH 27.1 26.0 - 34.0 pg   MCHC 34.2 30.0 - 36.0 g/dL   RDW 21.3 08.6 - 57.8 %   Platelets 211 150 - 400 K/uL   nRBC 0.0 0.0 - 0.2 %    Comment: Performed at [facility], [address], [place], [place] [address]  Basic metabolic panel     Status: Abnormal   Collection Time: 09/13/20  4:27 AM  Result Value Ref Range   Sodium 138 135 - 145 mmol/L   Potassium 3.5 3.5 - 5.1 mmol/L   Chloride 101 98 - 111 mmol/L   CO2 31 22 - 32 mmol/L   Glucose, Bld 102 (H) 70 - 99 mg/dL    Comment: Glucose reference range applies only to samples taken after fasting for at least 8 hours.   BUN 9 6 - 20 mg/dL   Creatinine, Ser 9.52 0.44 - 1.00 mg/dL   Calcium 8.6 (L)  8.9 - 10.3 mg/dL   GFR, Estimated >84 >13 mL/min    Comment: (NOTE) Calculated using the CKD-EPI Creatinine Equation (2021)    Anion gap 6 5 - 15    Comment: Performed at [facility], [address], [place], [place] [address]    No results found.  Past Medical History:  Diagnosis Date   Anxiety    Arthritis    Diverticulitis    Diverticulitis of large intestine with abscess 11/14/2019   DVT (deep venous thrombosis) (HCC)    Hypertension    Intra-abdominal abscess (HCC) 11/02/2019   Pre-diabetes    Refusal of blood product    Severe sepsis (HCC) 11/06/2019    Past Surgical History:  Procedure Laterality Date   ABDOMINAL HYSTERECTOMY     BOWEL RESECTION N/A 11/25/2019   Procedure: SMALL BOWEL RESECTION;  Surgeon: Almond Lint, MD;  Location: [facility] OR;  Service: General;  Laterality: N/A;   COLECTOMY N/A 11/25/2019   Procedure: SIGMOID COLECTOMY;  Surgeon: Almond Lint, MD;  Location: [facility] OR;  Service: General;  Laterality: N/A;   COLOSTOMY N/A 11/25/2019   Procedure: CREATION OF COLOSTOMY;  Surgeon: Almond Lint, MD;  Location: [facility] OR;  Service: General;  Laterality: N/A;   INCISION AND DRAINAGE ABSCESS N/A 11/25/2019   Procedure: INCISION AND DRAINAGE ABDOMINAL ABSCESS;  Surgeon: Almond Lint, MD;  Location: [facility] OR;  Service: General;  Laterality: N/A;   IR IVC FILTER PLMT / S&I Lenise Arena GUID/MOD SED  12/03/2019   LAPAROTOMY N/A 11/25/2019   Procedure: EXPLORATORY LAPAROTOMY;  Surgeon: Almond Lint, MD;  Location: [facility] OR;  Service: General;  Laterality: N/A;   LYSIS OF ADHESION N/A 09/08/2020   Procedure: LYSIS OF ADHESION;  Surgeon: Karie Soda, MD;  Location: [facility] ORS;  Service: General;  Laterality: N/A;   PROCTOSCOPY N/A 09/08/2020   Procedure: RIGID PROCTOSCOPY;  Surgeon: Karie Soda, MD;  Location: [facility];  Service: General;  Laterality: N/A;   XI ROBOTIC ASSISTED COLOSTOMY TAKEDOWN N/A 09/08/2020   Procedure: XI ROBOTIC ASSISTED OSTOMY TAKEDOWN;TAP BLOCKS, PRIMARY  INCISIONAL PERITONEAL HERNIA REPAIR;  Surgeon: Karie Soda, MD;  Location: [facility];  Service: General;  Laterality: N/A;    Social History   Socioeconomic History   Marital status: Single    Spouse name: Not on file   Number of children: Not on file   Years of education: Not on file   Highest education level: Not on file  Occupational History   Not on file  Tobacco Use   Smoking status: Never   Smokeless tobacco: Never  Vaping Use   Vaping Use: Never used  Substance and Sexual Activity   Alcohol use: No   Drug use: No   Sexual activity: Not on file  Other Topics Concern   Not on file  Social History Narrative   Not on file   Social Determinants of Health   Financial Resource Strain: Not on file  Food Insecurity: Not on file  Transportation Needs: Not on file  Physical Activity: Not on file  Stress: Not on file  Social Connections: Not on file  Intimate Partner Violence: Not on file    Family History  Problem Relation Age of Onset   Hypertension Mother    Diabetes Mother    Cancer Father     Current Facility-Administered Medications  Medication Dose Route Frequency Provider Last Rate Last Admin   0.9 %  sodium chloride infusion  250 mL Intravenous PRN Curt Oatis, Viviann Spare, MD       0.9 %  sodium chloride infusion  250 mL Intravenous PRN Karie Soda, MD       acetaminophen (TYLENOL) tablet 1,000 mg  1,000 mg Oral Trecia Rogers, MD   1,000 mg at 09/13/20 0542   ALPRAZolam Prudy Feeler) tablet 0.5 mg  0.5 mg Oral TID PRN Karie Soda, MD   0.5 mg at 09/12/20 2159   alum & mag hydroxide-simeth (MAALOX/MYLANTA) 200-200-20 MG/5ML suspension 30 mL  30 mL Oral Q6H PRN Karie Soda, MD       diphenhydrAMINE (BENADRYL) 12.5 MG/5ML elixir 12.5 mg  12.5 mg Oral Q6H PRN Karie Soda, MD       Or   diphenhydrAMINE (BENADRYL) injection 12.5 mg  12.5 mg Intravenous Q6H PRN Karie Soda, MD       enalaprilat (VASOTEC) injection 0.625-1.25 mg  0.625-1.25 mg Intravenous Q6H PRN  Karie Soda, MD       enoxaparin (LOVENOX) injection 40 mg  40 mg Subcutaneous Q24H Karie Soda, MD   40 mg at 09/12/20 0844   feeding supplement (ENSURE SURGERY) liquid 237 mL  237 mL Oral BID BM Karie Soda, MD   237 mL at 09/09/20 1215   gabapentin (NEURONTIN) capsule 200 mg  200 mg Oral TID Karie Soda, MD   200 mg at 09/12/20 2159   losartan (COZAAR) tablet 50 mg  50 mg Oral Daily Karie Soda, MD   50 mg at 09/12/20 1027   And   hydrochlorothiazide (MICROZIDE) capsule 12.5 mg  12.5 mg Oral Daily Karie Soda, MD   12.5 mg at 09/12/20  1027   HYDROmorphone (DILAUDID) injection 0.5-2 mg  0.5-2 mg Intravenous Q4H PRN Karie Soda, MD   1 mg at 09/12/20 1032   lip balm (CARMEX) ointment 1 application  1 application Topical BID Karie Soda, MD   1 application at 09/12/20 2200   magic mouthwash  15 mL Oral QID PRN Karie Soda, MD       melatonin tablet 3 mg  3 mg Oral QHS PRN Karie Soda, MD       methocarbamol (ROBAXIN) 1,000 mg in dextrose 5 % 100 mL IVPB  1,000 mg Intravenous Q6H PRN Karie Soda, MD       methocarbamol (ROBAXIN) tablet 1,000 mg  1,000 mg Oral Q6H PRN Karie Soda, MD   1,000 mg at 09/10/20 2255   metoprolol tartrate (LOPRESSOR) injection 5 mg  5 mg Intravenous Q6H PRN Karie Soda, MD       multivitamin with minerals tablet 1 tablet  1 tablet Oral Once per day on Mon Sharyon Medicus, MD   1 tablet at 09/09/20 0812   ondansetron (ZOFRAN) tablet 4 mg  4 mg Oral Q6H PRN Karie Soda, MD   4 mg at 09/12/20 1937   Or   ondansetron (ZOFRAN) injection 4 mg  4 mg Intravenous Q6H PRN Karie Soda, MD   4 mg at 09/11/20 0836   oxyCODONE (Oxy IR/ROXICODONE) immediate release tablet 5-10 mg  5-10 mg Oral Q4H PRN Karie Soda, MD   10 mg at 09/13/20 0302   polycarbophil (FIBERCON) tablet 625 mg  625 mg Oral BID Karie Soda, MD   625 mg at 09/12/20 2159   prochlorperazine (COMPAZINE) tablet 10 mg  10 mg Oral Q6H PRN Karie Soda, MD       Or   prochlorperazine  (COMPAZINE) injection 5-10 mg  5-10 mg Intravenous Q6H PRN Karie Soda, MD       simethicone (MYLICON) chewable tablet 40 mg  40 mg Oral Q6H PRN Karie Soda, MD       sodium chloride flush (NS) 0.9 % injection 3 mL  3 mL Intravenous Catha Gosselin, MD   3 mL at 09/10/20 2117   sodium chloride flush (NS) 0.9 % injection 3 mL  3 mL Intravenous PRN Karie Soda, MD       sodium chloride flush (NS) 0.9 % injection 3 mL  3 mL Intravenous Catha Gosselin, MD   3 mL at 09/12/20 2201   sodium chloride flush (NS) 0.9 % injection 3 mL  3 mL Intravenous PRN Karie Soda, MD         Allergies  Allergen Reactions   Other     Blood refusal   Aspirin Diarrhea    stomach ache.     Signed: Lorenso Courier, MD, FACS, MASCRS Esophageal, Gastrointestinal & Colorectal Surgery Robotic and Minimally Invasive Surgery  [facility] [address] [place], [place] [address] [phone] Fax [phone] Main/Paging  CONTACT INFORMATION: Weekday (9AM-5PM) concerns: Call [facility] main office at [phone] Weeknight (5PM-9AM) or Weekend/Holiday concerns: Check [email or web address] for General Surgery [facility] coverage (Please, do not use SecureChat as it is not reliable communication to operating surgeons for immediate patient care)      09/13/2020, 7:51 AM

## 2020-09-23 ENCOUNTER — Other Ambulatory Visit: Payer: Self-pay

## 2020-10-26 ENCOUNTER — Ambulatory Visit (HOSPITAL_COMMUNITY)
Admission: RE | Admit: 2020-10-26 | Discharge: 2020-10-26 | Disposition: A | Discharge status: Home / Self Care | Payer: Commercial Managed Care - PPO | Attending: Surgery | Admitting: Surgery

## 2020-10-26 ENCOUNTER — Other Ambulatory Visit: Payer: Self-pay

## 2020-10-26 ENCOUNTER — Encounter (HOSPITAL_COMMUNITY)
Admission: RE | Disposition: A | Discharge status: Home / Self Care | Payer: Self-pay | Source: Home / Self Care | Attending: Surgery

## 2020-10-26 ENCOUNTER — Encounter (HOSPITAL_COMMUNITY): Payer: Self-pay | Admitting: Surgery

## 2020-10-26 DIAGNOSIS — Z86718 Personal history of other venous thrombosis and embolism: Secondary | ICD-10-CM | POA: Insufficient documentation

## 2020-10-26 DIAGNOSIS — Z4589 Encounter for adjustment and management of other implanted devices: Secondary | ICD-10-CM | POA: Insufficient documentation

## 2020-10-26 HISTORY — PX: IVC FILTER REMOVAL: CATH118246

## 2020-10-26 LAB — POCT I-STAT, CHEM 8
BUN: 17 mg/dL (ref 6–20)
Calcium, Ion: 1.17 mmol/L (ref 1.15–1.40)
Chloride: 104 mmol/L (ref 98–111)
Creatinine, Ser: 0.6 mg/dL (ref 0.44–1.00)
Glucose, Bld: 97 mg/dL (ref 70–99)
HCT: 38 % (ref 36.0–46.0)
Hemoglobin: 12.9 g/dL (ref 12.0–15.0)
Potassium: 3.5 mmol/L (ref 3.5–5.1)
Sodium: 140 mmol/L (ref 135–145)
TCO2: 27 mmol/L (ref 22–32)

## 2020-10-26 SURGERY — IVC FILTER REMOVAL
Anesthesia: LOCAL

## 2020-10-26 MED ORDER — MIDAZOLAM HCL 2 MG/2ML IJ SOLN
INTRAMUSCULAR | Status: DC | PRN
  Administered 2020-10-26 (×2): 1 mg via INTRAVENOUS

## 2020-10-26 MED ORDER — LIDOCAINE HCL (PF) 1 % IJ SOLN
INTRAMUSCULAR | Status: DC | PRN
  Administered 2020-10-26: 5 mL via INTRADERMAL

## 2020-10-26 MED ORDER — MIDAZOLAM HCL 2 MG/2ML IJ SOLN
INTRAMUSCULAR | Status: AC
  Filled 2020-10-26: qty 2

## 2020-10-26 MED ORDER — FENTANYL CITRATE (PF) 100 MCG/2ML IJ SOLN
INTRAMUSCULAR | Status: AC
  Filled 2020-10-26: qty 2

## 2020-10-26 MED ORDER — FENTANYL CITRATE (PF) 100 MCG/2ML IJ SOLN
INTRAMUSCULAR | Status: DC | PRN
  Administered 2020-10-26 (×2): 50 ug via INTRAVENOUS

## 2020-10-26 MED ORDER — HEPARIN (PORCINE) IN NACL 1000-0.9 UT/500ML-% IV SOLN
INTRAVENOUS | Status: AC
  Filled 2020-10-26: qty 500

## 2020-10-26 MED ORDER — LIDOCAINE HCL (PF) 1 % IJ SOLN
INTRAMUSCULAR | Status: AC
  Filled 2020-10-26: qty 30

## 2020-10-26 MED ORDER — HEPARIN (PORCINE) IN NACL 1000-0.9 UT/500ML-% IV SOLN
INTRAVENOUS | Status: DC | PRN
  Administered 2020-10-26: 500 mL

## 2020-10-26 MED ORDER — SODIUM CHLORIDE 0.9 % IV SOLN: INTRAVENOUS | Status: DC

## 2020-10-26 MED ORDER — IODIXANOL 320 MG/ML IV SOLN
INTRAVENOUS | Status: DC | PRN
  Administered 2020-10-26: 20 mL via INTRAVENOUS

## 2020-10-26 SURGICAL SUPPLY — 12 items
CATH ANGIO 5F BER2 65CM (CATHETERS) ×2 IMPLANT
CATH OMNI FLUSH 5F 65CM (CATHETERS) ×2 IMPLANT
DEVICE ONE SNARE 15MM (VASCULAR PRODUCTS) ×2 IMPLANT
KIT MICROPUNCTURE NIT STIFF (SHEATH) ×2 IMPLANT
KIT PV (KITS) ×2 IMPLANT
SHEATH DESTINATION 8F 45CM (SHEATH) ×2 IMPLANT
SHEATH PINNACLE 5F 10CM (SHEATH) ×2 IMPLANT
SYR MEDRAD MARK V 150ML (SYRINGE) ×2 IMPLANT
TRANSDUCER W/STOPCOCK (MISCELLANEOUS) ×2 IMPLANT
TRAY PV CATH (CUSTOM PROCEDURE TRAY) ×2 IMPLANT
TUBING CIL FLEX 10 FLL-RA (TUBING) ×2 IMPLANT
WIRE STARTER BENTSON 035X150 (WIRE) ×2 IMPLANT

## 2020-10-26 NOTE — Op Note (Addendum)
    Patient name: [name] MRN: [id number] DOB: February 27, 1966 Sex: female  10/26/2020 Pre-operative Diagnosis: History of DVT Post-operative diagnosis:  Same Surgeon:  Durene Cal Procedure Performed:  1.  Ultrasound-guided access, right internal jugular vein  2.  Inferior venacavogram  3.  Foreign body removal (inferior vena cava filter  4.  Conscious sedation, 33 minutes    Indications: This is a 55 year old female who had a provoked DVT around the time of GI issues.  A IVC filter was placed.  She is here today for removal.  Procedure:  The patient was identified in the holding area and taken to room 8.  The patient was then placed supine on the table and prepped and draped in the usual sterile fashion.  A time out was called.  Conscious sedation was administered with the use of IV fentanyl and Versed under continuous physician and nurse monitoring.  Heart rate, blood pressure, and oxygen saturation were continuously monitored.  Total sedation time was 33 minutes.  Ultrasound was used to evaluate the right internal jugular vein which was widely patent and easily compressible.  A digital ultrasound image was acquired.  1% lidocaine was used for local anesthesia.  The right internal jugular vein was then cannulated with a micropuncture needle under ultrasound guidance.  A 018 wire was inserted followed by micropuncture sheath.  Next a 035 wire was directed into the inferior vena cava.  A 5 French sheath was placed followed by a Omni Flush catheter.  An inferior venacavogram was performed which showed a widely patent vena cava with no evidence of thrombus within the filter.  Next, a 8 French sheath was inserted.  I used a 15 mm snare to grab the surface of the filter.  The filter was then removed.  It was inspected on the back table and was intact.  There were no immediate complications.  Impression:  #1  Successful removal of IVC filter    V. Durene Cal, M.D., [facility] [place] Office: [phone] Pager:  [phone]

## 2020-10-26 NOTE — H&P (Signed)
   Patient name: [name] MRN: [id number] DOB: 1966/02/01 Sex: female    HISTORY OF PRESENT ILLNESS:   [name] is a 55 y.o. female with IVC filter for DVT.  She ahs had her colostomy taken down.  CURRENT MEDICATIONS:    Current Facility-Administered Medications  Medication Dose Route Frequency Provider Last Rate Last Admin   0.9 %  sodium chloride infusion   Intravenous Continuous Nada Libman, MD 100 mL/hr at 10/26/20 0635 New Bag at 10/26/20 [phone]    REVIEW OF SYSTEMS:   [X]  denotes positive finding, [ ]  denotes negative finding Cardiac  Comments:  Chest pain or chest pressure:    Shortness of breath upon exertion:    Short of breath when lying flat:    Irregular heart rhythm:    Constitutional    Fever or chills:      PHYSICAL EXAM:   Vitals:   10/26/20 0606  BP: 129/76  Pulse: (!) 103  Temp: 97.9 F (36.6 C)  TempSrc: Oral  SpO2: 99%  Weight: 108.9 kg  Height: 5\' 6"  (1.676 m)    GENERAL: The patient is a well-nourished female, in no acute distress. The vital signs are documented above. CARDIOVASCULAR: There is a regular rate and rhythm. PULMONARY: Non-labored respirations   STUDIES:      MEDICAL ISSUES:   Discussed proceeding with IVC filter removal.  All questions answered.  , MD, FACS [facility] [phone] Pager [phone]

## 2020-10-26 NOTE — Progress Notes (Signed)
Discharge instructions reviewed with pt and her sister. Both voice understanding, work note given to pt,

## 2020-11-01 ENCOUNTER — Other Ambulatory Visit: Payer: Self-pay

## 2020-11-01 ENCOUNTER — Telehealth: Payer: Self-pay

## 2020-11-01 DIAGNOSIS — I824Y9 Acute embolism and thrombosis of unspecified deep veins of unspecified proximal lower extremity: Secondary | ICD-10-CM

## 2020-11-01 NOTE — Telephone Encounter (Signed)
Patient calls today to report left lower extremity swelling and a lump on the front of her left calf. The swelling and lump has been present for at least a month - prior to the IVC filter removal on 10/26/20. She is finishing up a course of xarelto. She denies warmth and pain - says elevating helps the swelling a little. Discussed with Dr. Myra Gianotti, added patient on this week for LLE DVT study a lab only. Will check results and report to MD.

## 2020-11-05 ENCOUNTER — Ambulatory Visit (HOSPITAL_COMMUNITY): Payer: Commercial Managed Care - PPO

## 2020-11-12 ENCOUNTER — Other Ambulatory Visit: Payer: Self-pay

## 2020-11-12 ENCOUNTER — Ambulatory Visit (HOSPITAL_COMMUNITY)
Admission: RE | Admit: 2020-11-12 | Discharge: 2020-11-12 | Disposition: A | Discharge status: Home / Self Care | Payer: Commercial Managed Care - PPO | Source: Ambulatory Visit | Attending: Surgery | Admitting: Surgery

## 2020-11-12 DIAGNOSIS — I824Y9 Acute embolism and thrombosis of unspecified deep veins of unspecified proximal lower extremity: Secondary | ICD-10-CM

## 2020-11-16 ENCOUNTER — Other Ambulatory Visit: Payer: Self-pay

## 2020-11-16 ENCOUNTER — Ambulatory Visit (INDEPENDENT_AMBULATORY_CARE_PROVIDER_SITE_OTHER): Payer: Commercial Managed Care - PPO | Admitting: Surgery

## 2020-11-16 DIAGNOSIS — I824Y9 Acute embolism and thrombosis of unspecified deep veins of unspecified proximal lower extremity: Secondary | ICD-10-CM | POA: Diagnosis not present

## 2020-11-16 NOTE — Progress Notes (Signed)
[facility]  Patient name: [name] MRN: [id number] DOB: 1965-12-16 Sex: female      Virtual Visit via Telephone Note   This visit type was conducted due to national recommendations for restrictions regarding the COVID-19 Pandemic (e.g. social distancing) in an effort to limit this patient's exposure and mitigate transmission in our community.  Due to her co-morbid illnesses, this patient is at least at moderate risk for complications without adequate follow up.  This format is felt to be most appropriate for this patient at this time.  The patient did not have access to video technology/had technical difficulties with video requiring transitioning to audio format only (telephone).  All issues noted in this document were discussed and addressed.  No physical exam could be performed with this format.  Please refer to the patient's chart for her  consent to telehealth for [facility].   Patient Location: Home Provider Location: Office/Clinic    REASON FOR APPOINTMENT:    Follow up  HISTORY OF PRESENT ILLNESS:     [name] is a 55 y.o. female who I saw as a consult in the hospital on 12/02/2019 for a DVT.  The patient was admitted on 11/25/2019 with perforated diverticulitis.  She underwent an open sigmoid colectomy with end colostomy as well as small bowel resection.  She had persistent white blood cell count elevation so she underwent a CT scan that showed a undrained intra-abdominal abscess.  There was also a possible filling defect within her right iliac vein and so an ultrasound was performed.  This revealed a left popliteal and posterior tibial vein DVT.  Normally, anticoagulation would have been the treatment for this however the patient was anemic with a hemoglobin of 6.4, and she is a Jehovah's Witness.  Therefore anticoagulation was contraindicated.  A IVC filter was placed by interventional radiology at the time of attempted  abscess drainage.  She had her ostomy take down and the her filter removed on 10-26-2020.  She called the office complaining of a knot on her left calf.  I brought her in for a ultrasound and I am calling her to discuss the results.  She states that she is feeling very well at this time     PAST MEDICAL HISTORY    Past Medical History:  Diagnosis Date   Anxiety    Arthritis    Diverticulitis    Diverticulitis of large intestine with abscess 11/14/2019   DVT (deep venous thrombosis) (HCC)    Hypertension    Intra-abdominal abscess (HCC) 11/02/2019   Pre-diabetes    Refusal of blood product    Severe sepsis (HCC) 11/06/2019     FAMILY HISTORY   Family History  Problem Relation Age of Onset   Hypertension Mother    Diabetes Mother    Cancer Father     SOCIAL HISTORY:   Social History   Socioeconomic History   Marital status: Single    Spouse name: Not on file   Number of children: Not on file   Years of education: Not on file   Highest education level: Not on file  Occupational History   Not on file  Tobacco Use   Smoking status: Never   Smokeless tobacco: Never  Vaping Use   Vaping Use: Never used  Substance and Sexual Activity   Alcohol use: No   Drug use: No   Sexual activity: Not on  file  Other Topics Concern   Not on file  Social History Narrative   Not on file   Social Determinants of Health   Financial Resource Strain: Not on file  Food Insecurity: Not on file  Transportation Needs: Not on file  Physical Activity: Not on file  Stress: Not on file  Social Connections: Not on file  Intimate Partner Violence: Not on file    ALLERGIES:    Allergies  Allergen Reactions   Other     Blood refusal   Aspirin Diarrhea    stomach ache.     CURRENT MEDICATIONS:    Current Outpatient Medications  Medication Sig Dispense Refill   cyclobenzaprine (FLEXERIL) 10 MG tablet Take 10 mg by mouth 3 (three) times daily as needed for muscle spasms.      losartan-hydrochlorothiazide (HYZAAR) 50-12.5 MG per tablet Take 1 tablet by mouth daily.     methocarbamol (ROBAXIN) 500 MG tablet Take 1 tablet (500 mg total) by mouth every 8 (eight) hours as needed for muscle spasms. 20 tablet 1   oxyCODONE (OXY IR/ROXICODONE) 5 MG immediate release tablet Take 1-2 tablets (5-10 mg total) by mouth every 6 (six) hours as needed for moderate pain, severe pain or breakthrough pain. 30 tablet 0   polyethylene glycol (MIRALAX / GLYCOLAX) 17 g packet Take 17 g by mouth daily as needed for mild constipation. 14 each 0   promethazine (PHENERGAN) 25 MG tablet Take 25 mg by mouth every 12 (twelve) hours as needed for nausea or vomiting.     Vitamin D-Vitamin K (VITAMIN K2-VITAMIN D3 PO) Take 1 tablet by mouth daily.     XARELTO 20 MG TABS tablet TAKE 1 TABLET BY MOUTH DAILY WITH SUPPER. 30 tablet 3   No current facility-administered medications for this visit.    REVIEW OF SYSTEMS:   Please see the history of present illness.     All other systems reviewed and are negative.  PHYSICAL EXAM:   There were no vitals filed for this visit.    Recent Labs: 12/08/2019: ALT 13 09/09/2020: Magnesium 2.0 09/13/2020: Platelets 211 10/26/2020: BUN 17; Creatinine, Ser 0.60; Hemoglobin 12.9; Potassium 3.5; Sodium 140   Recent Lipid Panel Lab Results  Component Value Date/Time   TRIG 113 12/08/2019 03:41 AM    Wt Readings from Last 3 Encounters:  10/26/20 240 lb (108.9 kg)  09/13/20 264 lb 1.8 oz (119.8 kg)  09/02/20 257 lb 6 oz (116.7 kg)     STUDIES:   I have reviewed the following duplex: RIGHT:  - Findings consistent with age indeterminate deep vein thrombosis  involving the right common femoral vein, SF junction, right femoral vein,  right proximal profunda vein, right popliteal vein, right posterior tibial  veins, right peroneal veins, and EIV.  - No cystic structure found in the popliteal fossa.     LEFT:  - No evidence of common femoral vein  obstruction.   ASSESSMENT and PLAN   I discussed with her that the ultrasound was negative for DVT.  Since her DVT was a provoked DVT in the setting of perforated diverticulitis, I did do not see any reason to continue her anticoagulation at this time.  I have told her to discontinue her Xarelto.  She will contact me if she has any further questions.   Time:   Today, I have spent 8 minutes with the patient with telehealth technology discussing the above problems.      Charlena Cross, MD, FACS [facility] [phone] Pager [phone]

## 2020-11-19 ENCOUNTER — Other Ambulatory Visit: Payer: Self-pay | Admitting: Family Medicine

## 2020-11-19 DIAGNOSIS — R1032 Left lower quadrant pain: Secondary | ICD-10-CM

## 2020-12-01 ENCOUNTER — Other Ambulatory Visit: Payer: Self-pay

## 2020-12-01 ENCOUNTER — Ambulatory Visit: Payer: Commercial Managed Care - PPO | Admitting: Orthopaedic Surgery

## 2020-12-01 ENCOUNTER — Ambulatory Visit: Payer: Self-pay

## 2020-12-01 ENCOUNTER — Encounter: Payer: Self-pay | Admitting: Orthopaedic Surgery

## 2020-12-01 DIAGNOSIS — M1711 Unilateral primary osteoarthritis, right knee: Secondary | ICD-10-CM | POA: Diagnosis not present

## 2020-12-01 DIAGNOSIS — M1712 Unilateral primary osteoarthritis, left knee: Secondary | ICD-10-CM | POA: Diagnosis not present

## 2020-12-01 MED ORDER — METHYLPREDNISOLONE ACETATE 40 MG/ML IJ SUSP
40.0000 mg | INTRAMUSCULAR | Status: AC | PRN
Start: 2020-12-01 — End: 2020-12-01
  Administered 2020-12-01: 40 mg via INTRA_ARTICULAR

## 2020-12-01 MED ORDER — BUPIVACAINE HCL 0.5 % IJ SOLN
2.0000 mL | INTRAMUSCULAR | Status: AC | PRN
  Administered 2020-12-01: 2 mL via INTRA_ARTICULAR

## 2020-12-01 MED ORDER — LIDOCAINE HCL 1 % IJ SOLN
2.0000 mL | INTRAMUSCULAR | Status: AC | PRN
  Administered 2020-12-01: 2 mL

## 2020-12-01 NOTE — Progress Notes (Signed)
Office Visit Note   Patient: [name]           Date of Birth: [birth year]           MRN: [id number] Visit Date: 12/01/2020              Requested by: Gabrielle Pilon, FNP [address] [place],  [place] [address] PCP: Gabrielle Pilon, FNP   Assessment & Plan: Visit Diagnoses:  1. Primary osteoarthritis of right knee   2. Primary osteoarthritis of left knee     Plan: Impression is advanced tricompartmental bilateral knee DJD.  Treatment options were discussed today in detail and she will make all efforts to lose weight as she knows what impact the weight makes on the knee pain.  She is also agreeable to outpatient PT.  Bilateral knee cortisone injections performed today.  Pamphlet for [facility] was provided today.  Questions encouraged and answered.  Follow-up as needed.  Follow-Up Instructions: Return if symptoms worsen or fail to improve.   Orders:  Orders Placed This Encounter  Procedures   XR KNEE 3 VIEW RIGHT   XR KNEE 3 VIEW LEFT   Ambulatory referral to Physical Therapy   No orders of the defined types were placed in this encounter.     Procedures: Large Joint Inj: bilateral knee on 12/01/2020 8:46 AM Indications: pain Details: 22 G needle  Arthrogram: No  Medications (Right): 2 mL lidocaine 1 %; 2 mL bupivacaine 0.5 %; 40 mg methylPREDNISolone acetate 40 MG/ML Medications (Left): 2 mL lidocaine 1 %; 2 mL bupivacaine 0.5 %; 40 mg methylPREDNISolone acetate 40 MG/ML Outcome: tolerated well, no immediate complications Patient was prepped and draped in the usual sterile fashion.      Clinical Data: No additional findings.   Subjective: Chief Complaint  Patient presents with   Left Knee - Pain   Right Knee - Pain    [name] is a very pleasant 55 year old female whose suffered from bilateral knee pain from DJD for several years with recent worsening.  She has been moving to another home.  She works from home.  Denies any previous surgeries or injuries to  the knees.  She has seen another doctor in the past and she received cortisone injections which gave her some temporary relief.  NSAIDs do not work.  She was hospitalized with sepsis last year and she lost 100 pounds from this and during that time she felt no pain in her knees but as she has gained weight she has experienced more pain.  Denies any numbness and tingling.  There is swelling that worse at the end of the day.   Review of Systems  Constitutional: Negative.   HENT: Negative.    Eyes: Negative.   Respiratory: Negative.    Cardiovascular: Negative.   Endocrine: Negative.   Musculoskeletal: Negative.   Neurological: Negative.   Hematological: Negative.   Psychiatric/Behavioral: Negative.    All other systems reviewed and are negative.   Objective: Vital Signs: There were no vitals taken for this visit.  Physical Exam Vitals and nursing note reviewed.  Constitutional:      Appearance: She is well-developed.  HENT:     Head: Normocephalic and atraumatic.  Pulmonary:     Effort: Pulmonary effort is normal.  Abdominal:     Palpations: Abdomen is soft.  Musculoskeletal:     Cervical back: Neck supple.  Skin:    General: Skin is warm.     Capillary Refill: Capillary refill  takes less than 2 seconds.  Neurological:     Mental Status: She is alert and oriented to person, place, and time.  Psychiatric:        Behavior: Behavior normal.        Thought Content: Thought content normal.        Judgment: Judgment normal.    Ortho Exam Bilateral knees show trace effusions.  1+ crepitus with range of motion that causes moderate pain.  Collaterals and cruciates are stable.  Specialty Comments:  No specialty comments available.  Imaging: XR KNEE 3 VIEW LEFT  Result Date: 12/01/2020 Significant tricompartment degenerative joint disease  XR KNEE 3 VIEW RIGHT  Result Date: 12/01/2020 Significant degenerative joint disease    PMFS History: Patient Active Problem List    Diagnosis Date Noted   Refusal of blood transfusions as patient is Jehovah's Witness 09/08/2020   Diverticulitis of sigmoid colon 09/08/2020   Body mass index (BMI) 40.0-44.9, adult (HCC) 05/18/2020   Acquired thrombophilia (HCC) 05/18/2020   Colostomy present (HCC) 05/18/2020   Chronic pain 05/18/2020   Diverticular disease of colon 05/18/2020   Esophageal reflux 05/18/2020   Panic disorder 05/18/2020   Mixed anxiety and depressive disorder 05/18/2020   Irritable bowel syndrome 05/18/2020   Mixed hyperlipidemia 05/18/2020   Presence of IVC filter 12/03/2019   Protein-calorie malnutrition, severe 11/27/2019   Hypertension 11/14/2019   Anemia, chronic disease 11/14/2019   Past Medical History:  Diagnosis Date   Anxiety    Arthritis    Diverticulitis    Diverticulitis of large intestine with abscess 11/14/2019   DVT (deep venous thrombosis) (HCC)    Hypertension    Intra-abdominal abscess (HCC) 11/02/2019   Pre-diabetes    Refusal of blood product    Severe sepsis (HCC) 11/06/2019    Family History  Problem Relation Age of Onset   Hypertension Mother    Diabetes Mother    Cancer Father     Past Surgical History:  Procedure Laterality Date   ABDOMINAL HYSTERECTOMY     BOWEL RESECTION N/A 11/25/2019   Procedure: SMALL BOWEL RESECTION;  Surgeon: Almond Lint, MD;  Location: [facility] OR;  Service: General;  Laterality: N/A;   COLECTOMY N/A 11/25/2019   Procedure: SIGMOID COLECTOMY;  Surgeon: Almond Lint, MD;  Location: [facility] OR;  Service: General;  Laterality: N/A;   COLOSTOMY N/A 11/25/2019   Procedure: CREATION OF COLOSTOMY;  Surgeon: Almond Lint, MD;  Location: [facility] OR;  Service: General;  Laterality: N/A;   INCISION AND DRAINAGE ABSCESS N/A 11/25/2019   Procedure: INCISION AND DRAINAGE ABDOMINAL ABSCESS;  Surgeon: Almond Lint, MD;  Location: [facility] OR;  Service: General;  Laterality: N/A;   IR IVC FILTER PLMT / S&I /IMG GUID/MOD SED  12/03/2019   IVC FILTER REMOVAL N/A 10/26/2020    Procedure: IVC FILTER REMOVAL;  Surgeon: Nada Libman, MD;  Location: [facility] INVASIVE [facility];  Service: Cardiovascular;  Laterality: N/A;   LAPAROTOMY N/A 11/25/2019   Procedure: EXPLORATORY LAPAROTOMY;  Surgeon: Almond Lint, MD;  Location: [facility] OR;  Service: General;  Laterality: N/A;   LYSIS OF ADHESION N/A 09/08/2020   Procedure: LYSIS OF ADHESION;  Surgeon: Karie Soda, MD;  Location: [facility] ORS;  Service: General;  Laterality: N/A;   PROCTOSCOPY N/A 09/08/2020   Procedure: RIGID PROCTOSCOPY;  Surgeon: Karie Soda, MD;  Location: [facility];  Service: General;  Laterality: N/A;   XI ROBOTIC ASSISTED COLOSTOMY TAKEDOWN N/A 09/08/2020   Procedure: XI ROBOTIC ASSISTED OSTOMY TAKEDOWN;TAP BLOCKS, PRIMARY INCISIONAL  PERITONEAL HERNIA REPAIR;  Surgeon: Karie Soda, MD;  Location: [facility];  Service: General;  Laterality: N/A;   Social History   Occupational History   Not on file  Tobacco Use   Smoking status: Never   Smokeless tobacco: Never  Vaping Use   Vaping Use: Never used  Substance and Sexual Activity   Alcohol use: No   Drug use: No   Sexual activity: Not on file

## 2020-12-08 ENCOUNTER — Other Ambulatory Visit: Payer: Commercial Managed Care - PPO

## 2020-12-22 ENCOUNTER — Other Ambulatory Visit: Payer: Self-pay

## 2020-12-22 ENCOUNTER — Ambulatory Visit: Payer: Commercial Managed Care - PPO | Attending: Orthopaedic Surgery | Admitting: Physical Therapy

## 2020-12-22 ENCOUNTER — Encounter: Payer: Self-pay | Admitting: Physical Therapy

## 2020-12-22 DIAGNOSIS — M25561 Pain in right knee: Secondary | ICD-10-CM | POA: Insufficient documentation

## 2020-12-22 DIAGNOSIS — M25562 Pain in left knee: Secondary | ICD-10-CM | POA: Insufficient documentation

## 2020-12-22 DIAGNOSIS — R2689 Other abnormalities of gait and mobility: Secondary | ICD-10-CM

## 2020-12-22 DIAGNOSIS — R262 Difficulty in walking, not elsewhere classified: Secondary | ICD-10-CM | POA: Diagnosis present

## 2020-12-22 DIAGNOSIS — G8929 Other chronic pain: Secondary | ICD-10-CM

## 2020-12-22 NOTE — Patient Instructions (Signed)
Access Code: [email or web address] URL: [email or web address] Date: 12/22/2020 Prepared by: Georgina Peer  Exercises Supine Quadriceps Stretch with Strap on Table - 2 x daily - 7 x weekly - 2 sets - 30 sec hold Gastroc Stretch on Wall - 2 x daily - 7 x weekly - 2 sets - 30 sec hold Clamshell with Resistance - 2 x daily - 7 x weekly - 2 sets - 10 reps Supine Bridge with Mini Swiss Ball Between Knees - 2 x daily - 7 x weekly - 2 sets - 10 reps Sidelying Hip Abduction - 2 x daily - 7 x weekly - 2 sets - 10 reps

## 2020-12-22 NOTE — Therapy (Signed)
[facility] [address] [place], [place], [address] Phone: [phone]   Fax:  [phone]  Physical Therapy Evaluation  Patient Details  Name: [name] MRN: [id number] Date of Birth: 04-Feb-1966 Referring Provider (PT): Tarry Kos, MD   Encounter Date: 12/22/2020   PT End of Session - 12/22/20 0927     Visit Number 1    Number of Visits 17    Date for PT Re-Evaluation 02/16/21    Authorization Type UMR    Authorization - Number of Visits 30    PT Start Time 0849    PT Stop Time 0919    PT Time Calculation (min) 30 min    Activity Tolerance Patient tolerated treatment well    Behavior During Therapy [facility] for tasks assessed/performed             Past Medical History:  Diagnosis Date   Anxiety    Arthritis    Diverticulitis    Diverticulitis of large intestine with abscess 11/14/2019   DVT (deep venous thrombosis) (HCC)    Hypertension    Intra-abdominal abscess (HCC) 11/02/2019   Pre-diabetes    Refusal of blood product    Severe sepsis (HCC) 11/06/2019    Past Surgical History:  Procedure Laterality Date   ABDOMINAL HYSTERECTOMY     BOWEL RESECTION N/A 11/25/2019   Procedure: SMALL BOWEL RESECTION;  Surgeon: Almond Lint, MD;  [facility];  Service: General;  Laterality: N/A;   COLECTOMY N/A 11/25/2019   Procedure: SIGMOID COLECTOMY;  Surgeon: Almond Lint, MD;  [facility];  Service: General;  Laterality: N/A;   COLOSTOMY N/A 11/25/2019   Procedure: CREATION OF COLOSTOMY;  Surgeon: Almond Lint, MD;  [facility];  Service: General;  Laterality: N/A;   INCISION AND DRAINAGE ABSCESS N/A 11/25/2019   Procedure: INCISION AND DRAINAGE ABDOMINAL ABSCESS;  Surgeon: Almond Lint, MD;  [facility];  Service: General;  Laterality: N/A;   IR IVC FILTER PLMT / S&I /IMG GUID/MOD SED  12/03/2019   IVC FILTER REMOVAL N/A 10/26/2020   Procedure: IVC FILTER REMOVAL;  Surgeon: Nada Libman, MD;   [facility];  Service: Cardiovascular;  Laterality: N/A;   LAPAROTOMY N/A 11/25/2019   Procedure: EXPLORATORY LAPAROTOMY;  Surgeon: Almond Lint, MD;  [facility];  Service: General;  Laterality: N/A;   LYSIS OF ADHESION N/A 09/08/2020   Procedure: LYSIS OF ADHESION;  Surgeon: Karie Soda, MD;  [facility];  Service: General;  Laterality: N/A;   PROCTOSCOPY N/A 09/08/2020   Procedure: RIGID PROCTOSCOPY;  Surgeon: Karie Soda, MD;  [facility];  Service: General;  Laterality: N/A;   XI ROBOTIC ASSISTED COLOSTOMY TAKEDOWN N/A 09/08/2020   Procedure: XI ROBOTIC ASSISTED OSTOMY TAKEDOWN;TAP BLOCKS, PRIMARY INCISIONAL PERITONEAL HERNIA REPAIR;  Surgeon: Karie Soda, MD;  [facility];  Service: General;  Laterality: N/A;    There were no vitals filed for this visit.    Subjective Assessment - 12/22/20 0850     Subjective Patient reports B knee pain for the past 20 years. Pain is diffuse, throughout B knees. Worse with standing, walking, stairs. Better with Cortisone injections- "I can walk now." Denies use of AD. Denies N/T.  Does report episodes of B knee buckling but denies falls.    Pertinent History anxiety, DVT, HTN, pre-DM    Limitations Sitting;Lifting;Standing;Walking;House hold activities    How long can you sit comfortably? atleast 1 hour  How long can you stand comfortably? ~30 min    How long can you walk comfortably? ~30 min    Diagnostic tests 12/01/20 B knee xray: Significant degenerative joint disease    Patient Stated Goals decrease pain    Currently in Pain? Yes    Pain Score 0-No pain    Pain Location Knee    Pain Orientation Right;Left    Pain Descriptors / Indicators Aching;Sharp    Pain Type Chronic pain                [facility] PT Assessment - 12/22/20 0854       Assessment   Medical Diagnosis Primary osteoarthritis of right knee, Primary osteoarthritis of left knee    Referring Provider (PT) Tarry Kos, MD    Onset  Date/Surgical Date --   20 years   Next MD Visit not scheduled    Prior Therapy yes      Precautions   Precautions None      Balance Screen   Has the patient fallen in the past 6 months No    Has the patient had a decrease in activity level because of a fear of falling?  No    Is the patient reluctant to leave their home because of a fear of falling?  No      Home Tourist information centre manager residence    Research officer, trade union;Other relatives   mother and sister   Available Help at Discharge Family    Type of Home House    Home Access Level entry    Home Layout Two level    Alternate Level Stairs-Number of Steps 17    Alternate Level Stairs-Rails Right;Left    Home Equipment None      Prior Function   Level of Independence Independent    Vocation Full time employment    Vocation Requirements working from home 8hrs/dya    Leisure walking for exercise, aerobics      Cognition   Overall Cognitive Status Within Functional Limits for tasks assessed      Sensation   Light Touch Appears Intact      Coordination   Gross Motor Movements are Fluid and Coordinated Yes      Posture/Postural Control   Posture/Postural Control Postural limitations    Postural Limitations Rounded Shoulders;Flexed trunk      ROM / Strength   AROM / PROM / Strength AROM;PROM;Strength      AROM   AROM Assessment Site Knee    Right/Left Knee Right;Left    Right Knee Extension 1    Right Knee Flexion 123    Left Knee Extension 2    Left Knee Flexion 115      Strength   Overall Strength Comments in sitting    Strength Assessment Site Hip;Knee;Ankle    Right/Left Hip Right;Left    Right Hip Flexion 4+/5    Right Hip ABduction 4+/5    Right Hip ADduction 4+/5    Left Hip Flexion 4+/5    Left Hip ABduction 4+/5    Left Hip ADduction 4+/5    Right/Left Knee Right;Left    Right Knee Flexion 4+/5    Right Knee Extension 4+/5    Left Knee Flexion 4/5   pain   Left Knee  Extension 4+/5    Right/Left Ankle Right;Left    Right Ankle Dorsiflexion 4+/5    Right Ankle Plantar Flexion 4+/5    Left Ankle Dorsiflexion 4+/5  Left Ankle Plantar Flexion 4+/5      Flexibility   Soft Tissue Assessment /Muscle Length yes    Hamstrings B WFL    Quadriceps severely tight B      Palpation   Patella mobility hypomobility superior/inferior glides B; B high riding patellae    Palpation comment edema over anterior, lateral, medial B knees with TTP over B proximal gastroc, medial R knee   no redness, edema, or warmth in B calf     Ambulation/Gait   Assistive device None    Gait Pattern Step-to pattern;Step-through pattern;Decreased step length - right;Decreased step length - left;Trunk flexed;Right flexed knee in stance;Left flexed knee in stance   audible B foot slap   Ambulation Surface Level;Indoor                        Objective measurements completed on examination: See above findings.                PT Education - 12/22/20 0927     Education Details prognosis, POC, HEP    Person(s) Educated Patient    Methods Explanation;Demonstration;Tactile cues;Verbal cues;Handout    Comprehension Verbalized understanding;Returned demonstration              PT Short Term Goals - 12/22/20 0937       PT SHORT TERM GOAL #1   Title Patient to be independent with initial HEP.    Time 3    Period Weeks    Status New    Target Date 01/12/21               PT Long Term Goals - 12/22/20 1018       PT LONG TERM GOAL #1   Title Patient to be independent with advanced HEP.    Time 8    Period Weeks    Status New    Target Date 02/16/21      PT LONG TERM GOAL #2   Title Patient to demonstrate B knee ROM WFL and without pain limiting.    Time 8    Period Weeks    Status New    Target Date 02/16/21      PT LONG TERM GOAL #3   Title Patient to demonstrate symmetrical step length, weight shift, and knee flexion with ambulation  with LRAD.    Time 8    Period Weeks    Status New    Target Date 02/16/21      PT LONG TERM GOAL #4   Title Patient to demonstrate alternating reciprocal pattern when ascending and descending stairs with good stability and 1 handrail as needed.    Time 8    Period Weeks    Status New    Target Date 02/16/21      PT LONG TERM GOAL #5   Title Patient to report tolerance for 30-45 minutes of cardio without knee pain limiting.    Time 8    Period Weeks    Status New    Target Date 02/16/21                    Plan - 12/22/20 0929     Clinical Impression Statement Patient is a 55 y/o F presenting to OPPT with c/o chronic B knee pain for the past 20 years. Pain occurs diffusely and worse with standing, walking, stairs. Reports episodes of B knee buckling but denies falls. Patient would like to return  to walking for exercise which she is currently limited in d/t pain. Patient today presenting with rounded and flexed posture and high-riding patellae, decreased B knee AROM, L HS weakness, severe tightness in B hip flexors/quads, mild B patellar hypomobility in superior/inferior directions, B knee edema, TTP over B proximal gastroc and medial R knee, and gait deviations. B calf supple and nontender and without warmth, redness, or edema. Patient was educated on gentle stretching and strengthening HEP and reported understanding. Would benefit from skilled PT services 1-2x/week for 8 weeks to address aforementioned impairments.    Personal Factors and Comorbidities Age;Fitness;Comorbidity 3+;Time since onset of injury/illness/exacerbation;Profession;Past/Current Experience    Comorbidities anxiety, DVT, HTN, pre-DM    Examination-Activity Limitations Locomotion Level;Transfers;Bend;Bathing;Sit;Carry;Squat;Stairs;Dressing;Hygiene/Grooming;Stand;Lift    Examination-Participation Restrictions Yard Work;Shop;Driving;Community Activity;Cleaning;Occupation;Church;Meal Prep    Stability/Clinical  Decision Making Stable/Uncomplicated    Clinical Decision Making Low    Rehab Potential Good    PT Frequency Other (comment)   1-2x   PT Duration 8 weeks    PT Treatment/Interventions ADLs/Self Care Home Management;Cryotherapy;Electrical Stimulation;Ultrasound;Moist Heat;Iontophoresis 4mg /ml Dexamethasone;Gait training;Stair training;Functional mobility training;Therapeutic activities;Therapeutic exercise;Balance training;Neuromuscular re-education;Manual techniques;Patient/family education;Scar mobilization;Passive range of motion;Dry needling;Energy conservation;Taping;Vasopneumatic Device    PT Next Visit Plan knee FOTO; reassess HEP; progress LE stretching and hip/core strengthening    Consulted and Agree with Plan of Care Patient             Patient will benefit from skilled therapeutic intervention in order to improve the following deficits and impairments:  Abnormal gait, Decreased range of motion, Difficulty walking, Increased fascial restricitons, Increased muscle spasms, Decreased safety awareness, Decreased activity tolerance, Pain, Improper body mechanics, Impaired flexibility, Hypomobility, Decreased balance, Decreased strength, Increased edema, Postural dysfunction  Visit Diagnosis: Chronic pain of left knee  Chronic pain of right knee  Difficulty in walking, not elsewhere classified  Other abnormalities of gait and mobility     Problem List Patient Active Problem List   Diagnosis Date Noted   Refusal of blood transfusions as patient is Jehovah's Witness 09/08/2020   Diverticulitis of sigmoid colon 09/08/2020   Body mass index (BMI) 40.0-44.9, adult (HCC) 05/18/2020   Acquired thrombophilia (HCC) 05/18/2020   Colostomy present (HCC) 05/18/2020   Chronic pain 05/18/2020   Diverticular disease of colon 05/18/2020   Esophageal reflux 05/18/2020   Panic disorder 05/18/2020   Mixed anxiety and depressive disorder 05/18/2020   Irritable bowel syndrome 05/18/2020    Mixed hyperlipidemia 05/18/2020   Presence of IVC filter 12/03/2019   Protein-calorie malnutrition, severe 11/27/2019   Hypertension 11/14/2019   Anemia, chronic disease 11/14/2019     11/16/2019, PT, DPT 12/22/20 10:25 AM   [facility] [place] [facility] [address] [place], [place], [place] Phone: [phone]   Fax:  [phone]  Name: [name] MRN: [id number] Date of Birth: 1965-09-17

## 2020-12-30 ENCOUNTER — Ambulatory Visit: Payer: Commercial Managed Care - PPO | Admitting: Physical Therapy

## 2021-01-06 ENCOUNTER — Ambulatory Visit: Payer: Commercial Managed Care - PPO | Attending: Orthopaedic Surgery | Admitting: Physical Therapy

## 2021-01-11 ENCOUNTER — Ambulatory Visit: Payer: Commercial Managed Care - PPO | Admitting: Orthopaedic Surgery

## 2021-01-13 ENCOUNTER — Ambulatory Visit: Payer: Commercial Managed Care - PPO | Admitting: Physical Therapy

## 2021-01-25 ENCOUNTER — Ambulatory Visit: Payer: Commercial Managed Care - PPO | Admitting: Orthopaedic Surgery

## 2021-02-01 ENCOUNTER — Other Ambulatory Visit: Payer: Self-pay

## 2021-02-01 ENCOUNTER — Telehealth: Payer: Self-pay

## 2021-02-01 ENCOUNTER — Ambulatory Visit (INDEPENDENT_AMBULATORY_CARE_PROVIDER_SITE_OTHER): Payer: Commercial Managed Care - PPO | Admitting: Orthopaedic Surgery

## 2021-02-01 ENCOUNTER — Encounter: Payer: Self-pay | Admitting: Orthopaedic Surgery

## 2021-02-01 DIAGNOSIS — M1711 Unilateral primary osteoarthritis, right knee: Secondary | ICD-10-CM | POA: Diagnosis not present

## 2021-02-01 DIAGNOSIS — M1712 Unilateral primary osteoarthritis, left knee: Secondary | ICD-10-CM | POA: Diagnosis not present

## 2021-02-01 MED ORDER — LIDOCAINE HCL 1 % IJ SOLN
2.0000 mL | INTRAMUSCULAR | Status: AC | PRN
  Administered 2021-02-01: 2 mL

## 2021-02-01 MED ORDER — METHYLPREDNISOLONE ACETATE 40 MG/ML IJ SUSP
40.0000 mg | INTRAMUSCULAR | Status: AC | PRN
Start: 2021-02-01 — End: 2021-02-01
  Administered 2021-02-01: 40 mg via INTRA_ARTICULAR

## 2021-02-01 MED ORDER — BUPIVACAINE HCL 0.5 % IJ SOLN
2.0000 mL | INTRAMUSCULAR | Status: AC | PRN
  Administered 2021-02-01: 2 mL via INTRA_ARTICULAR

## 2021-02-01 NOTE — Telephone Encounter (Signed)
Please precert for bilateral Synvisc injections. Xu's patient. Thanks

## 2021-02-01 NOTE — Progress Notes (Signed)
Office Visit Note   Patient: [name]           Date of Birth: 03-18-1966           MRN: [id number] Visit Date: 02/01/2021              Requested by: Soundra Pilon, FNP [address] [place],  [place] [address] PCP: Soundra Pilon, FNP   Assessment & Plan: Visit Diagnoses:  1. Primary osteoarthritis of right knee   2. Primary osteoarthritis of left knee     Plan: Impression is bilateral knee osteoarthritis exacerbation.  Cortisone injections performed in both knees today.  We will obtain authorization for Visco injection.  Questions encouraged and answered.  This patient is diagnosed with osteoarthritis of the knee(s).    Radiographs show evidence of joint space narrowing, osteophytes, subchondral sclerosis and/or subchondral cysts.  This patient has knee pain which interferes with functional and activities of daily living.    This patient has experienced inadequate response, adverse effects and/or intolerance with conservative treatments such as acetaminophen, NSAIDS, topical creams, physical therapy or regular exercise, knee bracing and/or weight loss.   This patient has experienced inadequate response or has a contraindication to intra articular steroid injections for at least 3 months.   This patient is not scheduled to have a total knee replacement within 6 months of starting treatment with viscosupplementation.  Follow-Up Instructions: No follow-ups on file.   Orders:  No orders of the defined types were placed in this encounter.  No orders of the defined types were placed in this encounter.     Procedures: Large Joint Inj: bilateral knee on 02/01/2021 8:51 AM Indications: pain Details: 22 G needle  Arthrogram: No  Medications (Right): 2 mL lidocaine 1 %; 2 mL bupivacaine 0.5 %; 40 mg methylPREDNISolone acetate 40 MG/ML Medications (Left): 2 mL lidocaine 1 %; 2 mL bupivacaine 0.5 %; 40 mg methylPREDNISolone acetate 40 MG/ML Outcome: tolerated well, no  immediate complications Patient was prepped and draped in the usual sterile fashion.      Clinical Data: No additional findings.   Subjective: Chief Complaint  Patient presents with  . Left Knee - Pain  . Right Knee - Pain    HPI  [name] returns today for exacerbation of bilateral knee pain due to arthritis.  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  She would like another set of injections.  Also interested in Visco injection.  Denies any other complaints.  Review of Systems   Objective: Vital Signs: There were no vitals taken for this visit.  Physical Exam  Ortho Exam  Bilateral knees show trace effusion.  Range of motion is overall well-preserved.  There is crepitus throughout the arc of motion.  Specialty Comments:  No specialty comments available.  Imaging: No results found.   PMFS History: Patient Active Problem List   Diagnosis Date Noted  . Refusal of blood transfusions as patient is Jehovah's Witness 09/08/2020  . Diverticulitis of sigmoid colon 09/08/2020  . Body mass index (BMI) 40.0-44.9, adult (HCC) 05/18/2020  . Acquired thrombophilia (HCC) 05/18/2020  . Colostomy present (HCC) 05/18/2020  . Chronic pain 05/18/2020  . Diverticular disease of colon 05/18/2020  . Esophageal reflux 05/18/2020  . Panic disorder 05/18/2020  . Mixed anxiety and depressive disorder 05/18/2020  . Irritable bowel syndrome 05/18/2020  . Mixed hyperlipidemia 05/18/2020  . Presence of IVC filter 12/03/2019  .  Protein-calorie malnutrition, severe 11/27/2019  . Hypertension 11/14/2019  . Anemia, chronic disease 11/14/2019   Past Medical History:  Diagnosis Date  . Anxiety   . Arthritis   . Diverticulitis   . Diverticulitis of large intestine with abscess 11/14/2019  . DVT (deep venous thrombosis) (HCC)   . Hypertension   . Intra-abdominal abscess (HCC) 11/02/2019  . Pre-diabetes   . Refusal of  blood product   . Severe sepsis (HCC) 11/06/2019    Family History  Problem Relation Age of Onset  . Hypertension Mother   . Diabetes Mother   . Cancer Father     Past Surgical History:  Procedure Laterality Date  . ABDOMINAL HYSTERECTOMY    . BOWEL RESECTION N/A 11/25/2019   Procedure: SMALL BOWEL RESECTION;  Surgeon: Almond Lint, MD;  [facility];  Service: General;  Laterality: N/A;  . COLECTOMY N/A 11/25/2019   Procedure: SIGMOID COLECTOMY;  Surgeon: Almond Lint, MD;  [facility];  Service: General;  Laterality: N/A;  . COLOSTOMY N/A 11/25/2019   Procedure: CREATION OF COLOSTOMY;  Surgeon: Almond Lint, MD;  [facility];  Service: General;  Laterality: N/A;  . INCISION AND DRAINAGE ABSCESS N/A 11/25/2019   Procedure: INCISION AND DRAINAGE ABDOMINAL ABSCESS;  Surgeon: Almond Lint, MD;  [facility];  Service: General;  Laterality: N/A;  . IR IVC FILTER PLMT / S&I Lenise Arena GUID/MOD SED  12/03/2019  . IVC FILTER REMOVAL N/A 10/26/2020   Procedure: IVC FILTER REMOVAL;  Surgeon: Nada Libman, MD;  [facility];  Service: Cardiovascular;  Laterality: N/A;  . LAPAROTOMY N/A 11/25/2019   Procedure: EXPLORATORY LAPAROTOMY;  Surgeon: Almond Lint, MD;  [facility];  Service: General;  Laterality: N/A;  . LYSIS OF ADHESION N/A 09/08/2020   Procedure: LYSIS OF ADHESION;  Surgeon: Karie Soda, MD;  [facility];  Service: General;  Laterality: N/A;  . PROCTOSCOPY N/A 09/08/2020   Procedure: RIGID PROCTOSCOPY;  Surgeon: Karie Soda, MD;  [facility];  Service: General;  Laterality: N/A;  . XI ROBOTIC ASSISTED COLOSTOMY TAKEDOWN N/A 09/08/2020   Procedure: XI ROBOTIC ASSISTED OSTOMY TAKEDOWN;TAP BLOCKS, PRIMARY INCISIONAL PERITONEAL HERNIA REPAIR;  Surgeon: Karie Soda, MD;  [facility];  Service: General;  Laterality: N/A;   Social History   Occupational History  . Not on file  Tobacco Use  . Smoking status: Never  . Smokeless tobacco: Never   Vaping Use  . Vaping Use: Never used  Substance and Sexual Activity  . Alcohol use: No  . Drug use: No  . Sexual activity: Not on file

## 2021-02-01 NOTE — Telephone Encounter (Signed)
Please submit for BIL knee gel inj

## 2021-02-01 NOTE — Telephone Encounter (Signed)
Noted  

## 2021-02-01 NOTE — Telephone Encounter (Signed)
Duplicate

## 2021-02-17 ENCOUNTER — Ambulatory Visit (HOSPITAL_BASED_OUTPATIENT_CLINIC_OR_DEPARTMENT_OTHER): Admission: RE | Admit: 2021-02-17 | Payer: Commercial Managed Care - PPO | Source: Ambulatory Visit

## 2021-02-22 ENCOUNTER — Encounter (HOSPITAL_COMMUNITY): Payer: Self-pay

## 2021-02-22 ENCOUNTER — Ambulatory Visit (HOSPITAL_COMMUNITY): Payer: Commercial Managed Care - PPO

## 2021-03-08 ENCOUNTER — Ambulatory Visit (HOSPITAL_COMMUNITY): Payer: Commercial Managed Care - PPO

## 2021-04-19 ENCOUNTER — Ambulatory Visit: Payer: Self-pay

## 2021-04-19 ENCOUNTER — Other Ambulatory Visit: Payer: Self-pay

## 2021-04-19 ENCOUNTER — Telehealth: Payer: Self-pay

## 2021-04-19 ENCOUNTER — Encounter: Payer: Self-pay | Admitting: Orthopaedic Surgery

## 2021-04-19 ENCOUNTER — Ambulatory Visit: Payer: Commercial Managed Care - PPO | Admitting: Orthopaedic Surgery

## 2021-04-19 DIAGNOSIS — M79671 Pain in right foot: Secondary | ICD-10-CM

## 2021-04-19 DIAGNOSIS — M79672 Pain in left foot: Secondary | ICD-10-CM

## 2021-04-19 MED ORDER — TRAMADOL HCL 50 MG PO TABS: 50.0000 mg | ORAL_TABLET | Freq: Two times a day (BID) | ORAL | 2 refills | Status: DC | PRN

## 2021-04-19 NOTE — Progress Notes (Signed)
Office Visit Note   Patient: [name]           Date of Birth: [birth year]           MRN: [id number] Visit Date: 04/19/2021              Requested by: Kristen Loader, [place],  [place] [address] PCP: Kristen Loader, FNP   Assessment & Plan: Visit Diagnoses:  1. Bilateral foot pain     Plan: impression is bilateral midfoot tarsal bossing.  I have recommended that she wear a rigid soled shoe that is loose fitting to the dorsum of the foot.  Of also recommended the continuation of Voltaren gel.  She will follow-up with [place] if her symptoms have not improved over the next several weeks.  Follow-Up Instructions: Return if symptoms worsen or fail to improve.   Orders:  Orders Placed This Encounter  Procedures   XR Foot Complete Left   XR Foot Complete Right   Meds ordered this encounter  Medications   traMADol (ULTRAM) 50 MG tablet    Sig: Take 1 tablet (50 mg total) by mouth every 12 (twelve) hours as needed.    Dispense:  30 tablet    Refill:  2      Procedures: No procedures performed   Clinical Data: No additional findings.   Subjective: Chief Complaint  Patient presents with   Right Foot - Pain   Left Foot - Pain    HPI patient is a pleasant 56 year old female who comes in today with bilateral foot pain left greater than right.  She noticed this approximately 2 years ago where it was intermittent until a few months ago where it has become more constant.  She denies any injury or change in activity.  The pain is primarily to the top of the midfoot but occasionally into the big toe.  Pain is worse with wearing tight fitting shoes as well as with walking.  She has tried multiple over-the-counter pain medications without relief.  Review of Systems as detailed in HPI.  All others reviewed and are negative.   Objective: Vital Signs: There were no vitals taken for this visit.  Physical Exam well-developed well-nourished female no acute  distress.  Alert and oriented x3.  Ortho Exam bilateral foot exam shows bony prominence to the dorsum of the midfoot.  She has mild tenderness throughout the great toe.  She can dorsiflex the great toe to approximately 30 degrees on both sides without pain.  She is neurovascularly intact distally.  Specialty Comments:  No specialty comments available.  Imaging: XR Foot Complete Left  Result Date: 04/19/2021 Impression is diffuse degenerative changes throughout the dorsum of the midfoot in addition to the MTP joint  XR Foot Complete Right  Result Date: 04/19/2021 Impression is diffuse degenerative changes throughout the dorsum of the midfoot in addition to the MTP joint    PMFS History: Patient Active Problem List   Diagnosis Date Noted   Refusal of blood transfusions as patient is Jehovah's Witness 09/08/2020   Diverticulitis of sigmoid colon 09/08/2020   Body mass index (BMI) 40.0-44.9, adult ([place]) 05/18/2020   Acquired thrombophilia ([place]) 05/18/2020   Colostomy present ([place]) 05/18/2020   Chronic pain 05/18/2020   Diverticular disease of colon 05/18/2020   Esophageal reflux 05/18/2020   Panic disorder 05/18/2020   Mixed anxiety and depressive disorder 05/18/2020   Irritable bowel syndrome 05/18/2020   Mixed hyperlipidemia 05/18/2020   Presence  of IVC filter 12/03/2019   Protein-calorie malnutrition, severe 11/27/2019   Hypertension 11/14/2019   Anemia, chronic disease 11/14/2019   Past Medical History:  Diagnosis Date   Anxiety    Arthritis    Diverticulitis    Diverticulitis of large intestine with abscess 11/14/2019   DVT (deep venous thrombosis) (HCC)    Hypertension    Intra-abdominal abscess ([place]) 11/02/2019   Pre-diabetes    Refusal of blood product    Severe sepsis ([place]) 11/06/2019    Family History  Problem Relation Age of Onset   Hypertension Mother    Diabetes Mother    Cancer Father     Past Surgical History:  Procedure Laterality Date   ABDOMINAL  HYSTERECTOMY     BOWEL RESECTION N/A 11/25/2019   Procedure: SMALL BOWEL RESECTION;  Surgeon: Stark Klein, MD;  Location: [place];  Service: General;  Laterality: N/A;   COLECTOMY N/A 11/25/2019   Procedure: SIGMOID COLECTOMY;  Surgeon: Stark Klein, MD;  Location: [place];  Service: General;  Laterality: N/A;   COLOSTOMY N/A 11/25/2019   Procedure: CREATION OF COLOSTOMY;  Surgeon: Stark Klein, MD;  Location: [place];  Service: General;  Laterality: N/A;   INCISION AND DRAINAGE ABSCESS N/A 11/25/2019   Procedure: INCISION AND DRAINAGE ABDOMINAL ABSCESS;  Surgeon: Stark Klein, MD;  Location: [place];  Service: General;  Laterality: N/A;   IR IVC FILTER PLMT / S&I /IMG GUID/MOD SED  12/03/2019   IVC FILTER REMOVAL N/A 10/26/2020   Procedure: IVC FILTER REMOVAL;  Surgeon: Serafina Mitchell, MD;  Location: [place] CV LAB;  Service: Cardiovascular;  Laterality: N/A;   LAPAROTOMY N/A 11/25/2019   Procedure: EXPLORATORY LAPAROTOMY;  Surgeon: Stark Klein, MD;  Location: [place];  Service: General;  Laterality: N/A;   LYSIS OF ADHESION N/A 09/08/2020   Procedure: LYSIS OF ADHESION;  Surgeon: Michael Boston, MD;  [facility];  Service: General;  Laterality: N/A;   PROCTOSCOPY N/A 09/08/2020   Procedure: RIGID PROCTOSCOPY;  Surgeon: Michael Boston, MD;  [facility];  Service: General;  Laterality: N/A;   XI ROBOTIC ASSISTED COLOSTOMY TAKEDOWN N/A 09/08/2020   Procedure: XI ROBOTIC ASSISTED OSTOMY TAKEDOWN;TAP BLOCKS, PRIMARY INCISIONAL PERITONEAL HERNIA REPAIR;  Surgeon: Michael Boston, MD;  [facility];  Service: General;  Laterality: N/A;   Social History   Occupational History   Not on file  Tobacco Use   Smoking status: Never   Smokeless tobacco: Never  Vaping Use   Vaping Use: Never used  Substance and Sexual Activity   Alcohol use: No   Drug use: No   Sexual activity: Not on file

## 2021-04-19 NOTE — Telephone Encounter (Signed)
VOB submitted for Monovisc, bilateral knee. BV pending 

## 2021-04-22 ENCOUNTER — Telehealth: Payer: Self-pay

## 2021-04-22 NOTE — Telephone Encounter (Addendum)
PA required for Monovisc, bilateral knee [facility]. [facility] [phone] and [phone]

## 2021-04-25 ENCOUNTER — Telehealth: Payer: Self-pay | Admitting: Orthopaedic Surgery

## 2021-04-25 NOTE — Telephone Encounter (Signed)
Patient called. Says the pain medication is not working. Also  would like something for her swelling. Her call back number is [phone]

## 2021-04-26 ENCOUNTER — Other Ambulatory Visit: Payer: Self-pay | Admitting: Physician Assistant

## 2021-04-26 MED ORDER — PREDNISONE 5 MG (21) PO TBPK: ORAL_TABLET | ORAL | 0 refills | Status: DC

## 2021-04-26 NOTE — Telephone Encounter (Signed)
Sent in prednisone pack.  Cannot send in nsaid for swelling as she is on xarelto

## 2021-04-27 NOTE — Telephone Encounter (Signed)
She states she Took Xarelto 2 years ago. Not taking it now.

## 2021-04-28 NOTE — Telephone Encounter (Signed)
Gotcha.  Well lets still just try the steroid for now, and if she still has swelling let us know and we will send in nsaid

## 2021-04-28 NOTE — Telephone Encounter (Signed)
Called patient to advise.   States she cannot take Tramadol-keeps her up and is not helping.

## 2021-04-28 NOTE — Telephone Encounter (Signed)
Will take steroid first

## 2021-05-02 ENCOUNTER — Other Ambulatory Visit: Payer: Self-pay | Admitting: Physician Assistant

## 2021-05-02 ENCOUNTER — Telehealth: Payer: Self-pay | Admitting: Orthopaedic Surgery

## 2021-05-02 NOTE — Telephone Encounter (Signed)
Pt called stating she was prescribed prednisone and was told to [facility] when she finished to let the Dr know how it worked. Pt states it didn't help and she's feels like it's actually worse now. Pt would like a CB to further discuss.   [phone]

## 2021-05-02 NOTE — Telephone Encounter (Signed)
States per last msg  you were going to send in Nsaid?

## 2021-05-02 NOTE — Telephone Encounter (Signed)
That's was an old Rx

## 2021-05-02 NOTE — Telephone Encounter (Signed)
If she is constantly in pain, come back in and see dr Erlinda Hong.  If pain just with activity/wearing shoes, make sure shoes are loose fitting

## 2021-05-02 NOTE — Telephone Encounter (Signed)
I don't think I did thsi bc it looks like she is on xarelto

## 2021-05-03 ENCOUNTER — Other Ambulatory Visit: Payer: Self-pay | Admitting: Physician Assistant

## 2021-05-03 MED ORDER — DICLOFENAC SODIUM 75 MG PO TBEC: 75.0000 mg | DELAYED_RELEASE_TABLET | Freq: Two times a day (BID) | ORAL | 2 refills | Status: DC | PRN

## 2021-05-03 NOTE — Telephone Encounter (Signed)
Sent in voltaren

## 2021-05-04 NOTE — Telephone Encounter (Signed)
Called patient no answer LMOM. Rx sent to pharm.  

## 2021-05-05 ENCOUNTER — Telehealth: Payer: Self-pay | Admitting: Physician Assistant

## 2021-05-05 NOTE — Telephone Encounter (Signed)
Patient called stating she picked up the Rx Diclofenac. Patient said she is allergic to Aspirin and Diclofenac has Aspirin in it. Patient asked if there is something else she can take. The number to contact patient is [phone]

## 2021-05-10 ENCOUNTER — Other Ambulatory Visit: Payer: Self-pay | Admitting: Physician Assistant

## 2021-05-10 NOTE — Telephone Encounter (Signed)
I am at a loss of what to give her at this point.  I can call in tylenol 3, but if she does not want or this does not help, she either needs to call pcp to see what other choices she may have or she can come in to see dr. Roda Shuttersxu

## 2021-05-10 NOTE — Telephone Encounter (Signed)
A few messages ago she wrote that it didn't help.  If that's what she wants I can try again

## 2021-05-11 NOTE — Telephone Encounter (Signed)
Called and left a VM advising patient that I am currently awaiting an approval from her insurance for the gel injection.  Will give a call once I get an update from the insurance.

## 2021-05-31 ENCOUNTER — Telehealth: Payer: Self-pay

## 2021-05-31 NOTE — Telephone Encounter (Signed)
Pt calling for an update on gel injections

## 2021-05-31 NOTE — Telephone Encounter (Signed)
Talked with patient about gel injection.  Appointment has been scheduled. 

## 2021-05-31 NOTE — Telephone Encounter (Signed)
Approved for Monovisc, bilateral knee. [place] Once OOP has been met, patient is covered at 100%. Co-pay of $60.00 No PA required per [facility] Care Management  Appt. 06/07/2021 with Dr. Erlinda Hong.

## 2021-06-07 ENCOUNTER — Ambulatory Visit: Payer: Commercial Managed Care - PPO | Admitting: Orthopaedic Surgery

## 2021-06-07 ENCOUNTER — Encounter: Payer: Self-pay | Admitting: Orthopaedic Surgery

## 2021-06-07 ENCOUNTER — Other Ambulatory Visit: Payer: Self-pay

## 2021-06-07 VITALS — Ht 66.5 in | Wt 288.0 lb

## 2021-06-07 DIAGNOSIS — M1711 Unilateral primary osteoarthritis, right knee: Secondary | ICD-10-CM | POA: Diagnosis not present

## 2021-06-07 DIAGNOSIS — M170 Bilateral primary osteoarthritis of knee: Secondary | ICD-10-CM

## 2021-06-07 DIAGNOSIS — M1712 Unilateral primary osteoarthritis, left knee: Secondary | ICD-10-CM

## 2021-06-07 DIAGNOSIS — Z6841 Body Mass Index (BMI) 40.0 and over, adult: Secondary | ICD-10-CM

## 2021-06-07 MED ORDER — HYALURONAN 88 MG/4ML IX SOSY
88.0000 mg | PREFILLED_SYRINGE | INTRA_ARTICULAR | Status: AC | PRN
  Administered 2021-06-07: 88 mg via INTRA_ARTICULAR

## 2021-06-07 MED ORDER — BUPIVACAINE HCL 0.25 % IJ SOLN
0.6600 mL | INTRAMUSCULAR | Status: AC | PRN
  Administered 2021-06-07: .66 mL via INTRA_ARTICULAR

## 2021-06-07 MED ORDER — LIDOCAINE HCL 1 % IJ SOLN
3.0000 mL | INTRAMUSCULAR | Status: AC | PRN
  Administered 2021-06-07: 3 mL

## 2021-06-07 NOTE — Progress Notes (Signed)
? ?Office Visit Note ?  ?Patient: [name]           ?Date of Birth: 04/08/1965           ?MRN: [id number] ?Visit Date: 06/07/2021 ?             ?Requested by: Soundra PilonBrake, Andrew R, FNP ?[address] ?[place] [id number] ?PCP: Soundra PilonBrake, Andrew R, FNP ? ? ?Assessment & Plan: ?Visit Diagnoses:  ?1. Bilateral primary osteoarthritis of knee   ?2. Body mass index 45.0-49.9, adult (HCC)   ?3. Morbid obesity (HCC)   ? ? ?Plan: Impression is advanced generative joint disease both knees.  Today, we injected both knees with Monovisc.  She tolerated this well.  We have discussed total knee replacement in the future.  She will follow-up with us as needed. ? ?The patient meets the AMA guidelines for Morbid (severe) obesity with a BMI > 40.0 and I have recommended weight loss. ? ?Follow-Up Instructions: Return if symptoms worsen or fail to improve.  ? ?Orders:  ?Orders Placed This Encounter  ?Procedures  ? Large Joint Inj: bilateral knee  ? ?No orders of the defined types were placed in this encounter. ? ? ? ? Procedures: ?Large Joint Inj: bilateral knee on 06/07/2021 8:40 AM ?Indications: pain ?Details: 22 G needle, anterolateral approach ?Medications (Right): 0.66 mL bupivacaine 0.25 %; 3 mL lidocaine 1 %; 88 mg Hyaluronan 88 MG/4ML ?Medications (Left): 0.66 mL bupivacaine 0.25 %; 3 mL lidocaine 1 %; 88 mg Hyaluronan 88 MG/4ML ? ? ? ? ?Clinical Data: ?No additional findings. ? ? ?Subjective: ?Chief Complaint  ?Patient presents with  ? Left Knee - Follow-up  ?  Monovisc  ? Right Knee - Follow-up  ?  Monovisc  ? ? ?HPI patient is a pleasant 56 year old female who comes in today for bilateral knee Monovisc injections.  Underlying history of advanced degenerative joint disease of both knees.  She is undergone cortisone injections in the past without significant relief of symptoms.  No previous viscosupplementation injections performed. ? ? ? ? ?Objective: ?Vital Signs: Ht 5' 6.5" (1.689 m)   Wt 288 lb (130.6 kg)   BMI 45.79  kg/m?  ? ? ? ?Ortho Exam stable bilateral knee exam ? ?Specialty Comments:  ?No specialty comments available. ? ?Imaging: ?No new imaging ? ? ?PMFS History: ?Patient Active Problem List  ? Diagnosis Date Noted  ? Refusal of blood transfusions as patient is Jehovah's Witness 09/08/2020  ? Diverticulitis of sigmoid colon 09/08/2020  ? Body mass index (BMI) 40.0-44.9, adult (HCC) 05/18/2020  ? Acquired thrombophilia (HCC) 05/18/2020  ? Colostomy present (HCC) 05/18/2020  ? Chronic pain 05/18/2020  ? Diverticular disease of colon 05/18/2020  ? Esophageal reflux 05/18/2020  ? Panic disorder 05/18/2020  ? Mixed anxiety and depressive disorder 05/18/2020  ? Irritable bowel syndrome 05/18/2020  ? Mixed hyperlipidemia 05/18/2020  ? Presence of IVC filter 12/03/2019  ? Protein-calorie malnutrition, severe 11/27/2019  ? Hypertension 11/14/2019  ? Anemia, chronic disease 11/14/2019  ? ?Past Medical History:  ?Diagnosis Date  ? Anxiety   ? Arthritis   ? Diverticulitis   ? Diverticulitis of large intestine with abscess 11/14/2019  ? DVT (deep venous thrombosis) (HCC)   ? Hypertension   ? Intra-abdominal abscess (HCC) 11/02/2019  ? Pre-diabetes   ? Refusal of blood product   ? Severe sepsis (HCC) 11/06/2019  ?  ?Family History  ?Problem Relation Age of Onset  ? Hypertension Mother   ? Diabetes Mother   ?  Cancer Father   ?  ?Past Surgical History:  ?Procedure Laterality Date  ? ABDOMINAL HYSTERECTOMY    ? BOWEL RESECTION N/A 11/25/2019  ? Procedure: SMALL BOWEL RESECTION;  Surgeon: Almond Lint, MD;  Location: [facility] OR;  Service: General;  Laterality: N/A;  ? COLECTOMY N/A 11/25/2019  ? Procedure: SIGMOID COLECTOMY;  Surgeon: Almond Lint, MD;  Location: [facility] OR;  Service: General;  Laterality: N/A;  ? COLOSTOMY N/A 11/25/2019  ? Procedure: CREATION OF COLOSTOMY;  Surgeon: Almond Lint, MD;  Location: [facility] OR;  Service: General;  Laterality: N/A;  ? INCISION AND DRAINAGE ABSCESS N/A 11/25/2019  ? Procedure: INCISION AND DRAINAGE ABDOMINAL  ABSCESS;  Surgeon: Almond Lint, MD;  Location: [facility] OR;  Service: General;  Laterality: N/A;  ? IR IVC FILTER PLMT / S&I Lenise Arena GUID/MOD SED  12/03/2019  ? IVC FILTER REMOVAL N/A 10/26/2020  ? Procedure: IVC FILTER REMOVAL;  Surgeon: Nada Libman, MD;  Location: [facility] INVASIVE CV LAB;  Service: Cardiovascular;  Laterality: N/A;  ? LAPAROTOMY N/A 11/25/2019  ? Procedure: EXPLORATORY LAPAROTOMY;  Surgeon: Almond Lint, MD;  Location: [facility] OR;  Service: General;  Laterality: N/A;  ? LYSIS OF ADHESION N/A 09/08/2020  ? Procedure: LYSIS OF ADHESION;  Surgeon: Karie Soda, MD;  Location: [facility] ORS;  Service: General;  Laterality: N/A;  ? PROCTOSCOPY N/A 09/08/2020  ? Procedure: RIGID PROCTOSCOPY;  Surgeon: Karie Soda, MD;  Location: [facility]S;  Service: General;  Laterality: N/A;  ? XI ROBOTIC ASSISTED COLOSTOMY TAKEDOWN N/A 09/08/2020  ? Procedure: XI ROBOTIC ASSISTED OSTOMY TAKEDOWN;TAP BLOCKS, PRIMARY INCISIONAL PERITONEAL HERNIA REPAIR;  Surgeon: Karie Soda, MD;  Location: [facility] ORS;  Service: General;  Laterality: N/A;  ? ?Social History  ? ?Occupational History  ? Not on file  ?Tobacco Use  ? Smoking status: Never  ? Smokeless tobacco: Never  ?Vaping Use  ? Vaping Use: Never used  ?Substance and Sexual Activity  ? Alcohol use: No  ? Drug use: No  ? Sexual activity: Not on file  ? ? ? ? ? ? ?

## 2021-06-08 ENCOUNTER — Other Ambulatory Visit: Payer: Self-pay

## 2021-06-08 ENCOUNTER — Telehealth: Payer: Self-pay

## 2021-06-08 DIAGNOSIS — Z6841 Body Mass Index (BMI) 40.0 and over, adult: Secondary | ICD-10-CM

## 2021-06-08 NOTE — Telephone Encounter (Signed)
Per Mardella Layman she wanted me to let patient know she is 5' 6.5 &  288lbs BMI of 45.79 and we will refer her to weight loss clinic. ? ? ?Called patient no answer. ? ?

## 2021-06-16 ENCOUNTER — Telehealth: Payer: Self-pay

## 2021-06-16 NOTE — Telephone Encounter (Signed)
Usually 1-2 weeks

## 2021-06-16 NOTE — Telephone Encounter (Signed)
Patient called regarding her gel injections she wanted to ask about when she will notice a difference and what medications she can take ?  ? ?Please advise  ?

## 2021-06-16 NOTE — Telephone Encounter (Signed)
Please advise 

## 2021-06-17 NOTE — Telephone Encounter (Signed)
Advised patient. She would like to pick up paper copies of her xrays . Advised her I will leave them at the front desk, ? ?

## 2021-07-07 ENCOUNTER — Telehealth: Payer: Self-pay | Admitting: Orthopaedic Surgery

## 2021-07-07 NOTE — Telephone Encounter (Signed)
Pt called to get the weight loss number  ? ?[facility] ?2.9 ?23 [employer] reviews ?Weight loss service in [place], [place] ?Address: [address] [place], [place], [place] [address] ?Hours:  ?Open ? Closes 6?PM ?Phone: [phone] ?

## 2021-07-30 ENCOUNTER — Other Ambulatory Visit: Payer: Self-pay | Admitting: Physician Assistant

## 2021-08-10 DIAGNOSIS — Z0289 Encounter for other administrative examinations: Secondary | ICD-10-CM

## 2021-09-14 ENCOUNTER — Ambulatory Visit (INDEPENDENT_AMBULATORY_CARE_PROVIDER_SITE_OTHER): Payer: Commercial Managed Care - PPO | Admitting: Family Medicine

## 2021-09-14 ENCOUNTER — Encounter (INDEPENDENT_AMBULATORY_CARE_PROVIDER_SITE_OTHER): Payer: Self-pay | Admitting: Family Medicine

## 2021-09-14 VITALS — BP 134/89 | HR 100 | Temp 98.1°F | Ht 66.0 in | Wt 295.0 lb

## 2021-09-14 DIAGNOSIS — Z1331 Encounter for screening for depression: Secondary | ICD-10-CM

## 2021-09-14 DIAGNOSIS — M159 Polyosteoarthritis, unspecified: Secondary | ICD-10-CM

## 2021-09-14 DIAGNOSIS — E66813 Obesity, class 3: Secondary | ICD-10-CM

## 2021-09-14 DIAGNOSIS — K581 Irritable bowel syndrome with constipation: Secondary | ICD-10-CM

## 2021-09-14 DIAGNOSIS — R0602 Shortness of breath: Secondary | ICD-10-CM

## 2021-09-14 DIAGNOSIS — Z9189 Other specified personal risk factors, not elsewhere classified: Secondary | ICD-10-CM

## 2021-09-14 DIAGNOSIS — R5383 Other fatigue: Secondary | ICD-10-CM

## 2021-09-14 DIAGNOSIS — R7303 Prediabetes: Secondary | ICD-10-CM

## 2021-09-14 DIAGNOSIS — F411 Generalized anxiety disorder: Secondary | ICD-10-CM | POA: Diagnosis not present

## 2021-09-14 DIAGNOSIS — I10 Essential (primary) hypertension: Secondary | ICD-10-CM | POA: Diagnosis not present

## 2021-09-14 DIAGNOSIS — Z6841 Body Mass Index (BMI) 40.0 and over, adult: Secondary | ICD-10-CM

## 2021-09-20 NOTE — Progress Notes (Unsigned)
Chief Complaint:   OBESITY [name] (MR# [id number]) is a 56 y.o. female who presents for evaluation and treatment of obesity and related comorbidities. Current BMI is Body mass index is 47.61 kg/m. [name] has been struggling with her weight for many years and has been unsuccessful in either losing weight, maintaining weight loss, or reaching her healthy weight goal.  [name] is currently in the action stage of change and ready to dedicate time achieving and maintaining a healthier weight. [name] is interested in becoming our patient and working on intensive lifestyle modifications including (but not limited to) diet and exercise for weight loss.  [name] works from home. Living with her are her 20 year old sister and 64 year old mother. She never weighs herself. In the past, she stayed at 1340 calories and exercised. Logging and calorie counting makes her more accountable and she likes that. Pt craves bacon, fried fish, and chocolate. She drinks regular soda, sweet, and juice daily. Pt states she does not snack.  [name]'s habits were reviewed today and are as follows: Her family eats meals together, she thinks her family will eat healthier with her, her desired weight loss is 115 lbs, she started gaining weight after age 64, her heaviest weight ever was 300 pounds, she is a picky eater and doesn't like to eat healthier foods, she has significant food cravings issues, she skips meals frequently, she is frequently drinking liquids with calories, and she frequently eats larger portions than normal.  Depression Screen [name]'s Food and Mood (modified PHQ-9) score was 5.     09/14/2021    7:25 AM  Depression screen PHQ 2/9  Decreased Interest 1  Down, Depressed, Hopeless 0  PHQ - 2 Score 1  Altered sleeping 1  Tired, decreased energy 1  Change in appetite 1  Feeling bad or failure about yourself  1  Trouble concentrating 0  Moving slowly or fidgety/restless 0  Suicidal thoughts 0   PHQ-9 Score 5  Difficult doing work/chores Not difficult at all   Subjective:   1. Other fatigue [name] admits to daytime somnolence and admits to waking up still tired. Patient has a history of symptoms of morning fatigue. [name] generally gets 6 hours of sleep per night, and states that she has poor sleep quality. Snoring is present. Apneic episodes are not present. Epworth Sleepiness Score is 1 and within normal limits. Pt denies emotional eating.   2. SOB (shortness of breath) on exertion [name] notes increasing shortness of breath with exercising and seems to be worsening over time with weight gain. She notes getting out of breath sooner with activity than she used to. This has gotten worse recently. [name] denies shortness of breath at rest or orthopnea.  3. Hypertension, goal below 150/90 Cardiovascular ROS: no chest pain or dyspnea on exertion. Medication: Losartan-HCTZ  4. GAD (generalized anxiety disorder) Symptoms stable. Medication: alprazolam prn  5. Irritable bowel syndrome with constipation Pt reports symptoms are mainly constipation. She takes OTC meds.  6. Prediabetes [name] has been prediabetic for 3-4 years but has never been on meds. Her A1c has been 5.7 at times and most recently 5.6 about 3 months ago.  7. Generalized osteoarthritis Pt's knees and back bother her. She sees orthopedist, Dr. Erlinda Hong.  55. At risk of diabetes mellitus [name] is at risk for diabetes mellitus due to prediabetes.  Assessment/Plan:   1. Other fatigue [name] does feel that her weight is causing her energy to be lower  than it should be. Fatigue may be related to obesity, depression or many other causes. Labs will be ordered, and in the meanwhile, [name] will focus on self care including making healthy food choices, increasing physical activity and focusing on stress reduction. Obtain fasting labs today. No need for ECG since pt is having one with her PCP next month.  - Vitamin B12 - CBC with  Differential/Platelet - Folate - T4, free - TSH - VITAMIN D 25 Hydroxy (Vit-D Deficiency, Fractures)  2. SOB (shortness of breath) on exertion [name] does feel that she gets out of breath more easily that she used to when she exercises. [name]'s shortness of breath appears to be obesity related and exercise induced. She has agreed to work on weight loss and gradually increase exercise to treat her exercise induced shortness of breath. Will continue to monitor closely.  3. Hypertension, goal below 150/90 At goal. [name] is working on healthy weight loss and exercise to improve blood pressure control. We will watch for signs of hypotension as she continues her lifestyle modifications. Obtain fasting labs today.  - Hemoglobin A1c - Lipid Panel With LDL/HDL Ratio  4. GAD (generalized anxiety disorder) Pt is stable with no issues. Behavior modification techniques were discussed today to help [name] deal with her anxiety.  Orders and follow up as documented in patient record. Check thyroid panel.  5. Irritable bowel syndrome with constipation [name] was informed that a decrease in bowel movement frequency is normal while losing weight, but stools should not be hard or painful. Orders and follow up as documented in patient record. Obtain fasting labs today.  Counseling Getting to Good Bowel Health: Your goal is to have one soft bowel movement each day. Drink at least 8 glasses of water each day. Eat plenty of fiber (goal is over 25 grams each day). It is best to get most of your fiber from dietary sources which includes leafy green vegetables, fresh fruit, and whole grains. You may need to add fiber with the help of OTC fiber supplements. These include Metamucil, Citrucel, and Flaxseed. If you are still having trouble, try adding Miralax or Magnesium Citrate. If all of these changes do not work, Dietitian.  - Comprehensive metabolic panel  6. Prediabetes [name] will continue to work on  weight loss, exercise, and decreasing simple carbohydrates to help decrease the risk of diabetes. Obtain fasting labs today.  - Hemoglobin A1c - Insulin, random - Lipid Panel With LDL/HDL Ratio  7. Generalized osteoarthritis Continue treatment per Dr. Roda Shutters as directed.  8. Depression screening [name] had a positive depression screening. Depression is commonly associated with obesity and often results in emotional eating behaviors. We will monitor this closely and work on CBT to help improve the non-hunger eating patterns. Referral to Psychology may be required if no improvement is seen as she continues in our clinic.  9. At risk of diabetes mellitus - [name] was given diabetes prevention education and counseling today of more than 24 minutes.  - Counseled patient on pathophysiology of disease and meaning/ implication of lab results.  - Reviewed how certain foods can either stimulate or inhibit insulin release, and subsequently affect hunger pathways  - Importance of following a healthy meal plan with limiting amounts of simple carbohydrates discussed with patient - Effects of regular aerobic exercise on blood sugar regulation reviewed and encouraged an eventual goal of 30 min 5d/week or more as a minimum.  - Briefly discussed treatment options, which always include dietary and  lifestyle modification as first line.   - Handouts provided at patient's desire and/or told to go online to the American Diabetes Association website for further information.  10. Class 3 severe obesity with serious comorbidity and body mass index (BMI) of 45.0 to 49.9 in adult, unspecified obesity type (HCC) [name] is currently in the action stage of change and her goal is to continue with weight loss efforts. I recommend [name] begin the structured treatment plan as follows:  She has agreed to the Category 3 Plan with only 200 snack calories. Pt prefers to journal intake as well.  Exercise goals:  As is    Behavioral  modification strategies: increasing water intake, decreasing liquid calories, and no skipping meals.  She was informed of the importance of frequent follow-up visits to maximize her success with intensive lifestyle modifications for her multiple health conditions. She was informed we would discuss her lab results at her next visit unless there is a critical issue that needs to be addressed sooner. [name] agreed to keep her next visit at the agreed upon time to discuss these results.  Objective:   Blood pressure 134/89, pulse 100, temperature 98.1 F (36.7 C), height 5\' 6"  (1.676 m), weight 295 lb (133.8 kg), SpO2 100 %. Body mass index is 47.61 kg/m.  EKG: No need for ECG since pt is having one with her PCP next month.  Indirect Calorimeter completed today shows a VO2 of 321 and a REE of 2218.  Her calculated basal metabolic rate is 2219 thus her basal metabolic rate is better than expected.  General: Cooperative, alert, well developed, in no acute distress. HEENT: Conjunctivae and lids unremarkable. Cardiovascular: Regular rhythm.  Lungs: Normal work of breathing. Neurologic: No focal deficits.   Lab Results  Component Value Date   CREATININE 0.60 10/26/2020   BUN 17 10/26/2020   NA 140 10/26/2020   K 3.5 10/26/2020   CL 104 10/26/2020   CO2 31 09/13/2020   Lab Results  Component Value Date   ALT 13 12/08/2019   AST 14 (L) 12/08/2019   ALKPHOS 69 12/08/2019   BILITOT 0.3 12/08/2019   Lab Results  Component Value Date   HGBA1C 5.7 (H) 09/02/2020   HGBA1C 5.5 11/16/2019   No results found for: "INSULIN" No results found for: "TSH" Lab Results  Component Value Date   TRIG 113 12/08/2019   Lab Results  Component Value Date   WBC 6.7 09/13/2020   HGB 12.9 10/26/2020   HCT 38.0 10/26/2020   MCV 79.2 (L) 09/13/2020   PLT 211 09/13/2020   Lab Results  Component Value Date   IRON 57 11/07/2019   TIBC 132 (L) 11/07/2019   FERRITIN 529 (H) 11/07/2019    Attestation  Statements:   Reviewed by clinician on day of visit: allergies, medications, problem list, medical history, surgical history, family history, social history, and previous encounter notes.  I, 01/07/2020, BS, CMA, am acting as transcriptionist for Gabrielle Rudd, DO.  I have reviewed the above documentation for accuracy and completeness, and I agree with the above. - ***

## 2021-09-28 ENCOUNTER — Ambulatory Visit (INDEPENDENT_AMBULATORY_CARE_PROVIDER_SITE_OTHER): Payer: Self-pay | Admitting: Family Medicine

## 2021-09-28 ENCOUNTER — Encounter (INDEPENDENT_AMBULATORY_CARE_PROVIDER_SITE_OTHER): Payer: Self-pay | Admitting: Family Medicine

## 2021-09-28 VITALS — BP 140/86 | HR 85 | Temp 98.0°F | Ht 66.0 in | Wt 291.0 lb

## 2021-09-28 DIAGNOSIS — F411 Generalized anxiety disorder: Secondary | ICD-10-CM

## 2021-09-28 DIAGNOSIS — I10 Essential (primary) hypertension: Secondary | ICD-10-CM

## 2021-09-28 DIAGNOSIS — E669 Obesity, unspecified: Secondary | ICD-10-CM

## 2021-09-28 DIAGNOSIS — Z6841 Body Mass Index (BMI) 40.0 and over, adult: Secondary | ICD-10-CM

## 2021-09-28 DIAGNOSIS — K5909 Other constipation: Secondary | ICD-10-CM

## 2021-09-29 DIAGNOSIS — I10 Essential (primary) hypertension: Secondary | ICD-10-CM | POA: Insufficient documentation

## 2021-09-29 DIAGNOSIS — F411 Generalized anxiety disorder: Secondary | ICD-10-CM | POA: Insufficient documentation

## 2021-09-29 DIAGNOSIS — K5909 Other constipation: Secondary | ICD-10-CM | POA: Insufficient documentation

## 2021-09-29 NOTE — Progress Notes (Signed)
Chief Complaint:   OBESITY [name] is here to discuss her progress with her obesity treatment plan along with follow-up of her obesity related diagnoses. [name] is on the Category 3 Plan and 200 snack calories and states she is following her eating plan approximately 80% of the time. [name] states she is walking 15-20 minutes 3 times per week.  Today's visit was #: 2 Starting weight: 295 lbs Starting date: 09/14/2021 Today's weight: 291 lbs Today's date: 09/29/2021 Total lbs lost to date: 4 lbs Total lbs lost since last in-office visit: 4 lbs  Interim History: [name] is here today for her first follow-up office visit since starting the program with [place].  All blood work/ lab tests that were recently ordered by myself or an outside provider were reviewed with patient today per their request.   Extended time was spent counseling her on all new disease processes that were discovered or preexisting ones that are affected by BMI.  she understands that many of these abnormalities will need to monitored regularly along with the current treatment plan of prudent dietary changes, in which we are making each and every office visit, to improve these health parameters. [name] voices that "it was way too much food".  She could only eat 7 oz at dinner, 4 oz at lunch.  She is bored with lunch.  She gained 3.2 lbs in muscle and lost 7.2 in fat mass.  She denies hunger or cravings.  She has been keeping a daily journal of her journey.    Subjective:   1. Essential hypertension Review: taking medications as instructed, no medication side effects noted, no chest pain on exertion, no dyspnea on exertion, no swelling of ankles.  She is taking Hyzaar.  2. Other constipation [name] has no issues or concerns, she is taking Miralax.  3. GAD (generalized anxiety disorder) [name] denies emotional eating concerns, she only uses Xanax for panic attacks.   Assessment/Plan:  No orders of the defined types  were placed in this encounter.   There are no discontinued medications.   No orders of the defined types were placed in this encounter.    1. Essential hypertension [name] is working on healthy weight loss and exercise to improve blood pressure control. We will watch for signs of hypotension as she continues her lifestyle modifications. Elevated blood pressure today, but has not taken medication yet.  She will focus on  prudent nutritional plan with less salt and continue medication.    2. Other constipation [name]'s symptoms are stable and well controlled.  Increase water intake to get in half of her weight in oz per day.   3. GAD (generalized anxiety disorder) Stable symptoms, continue treatment plan per PCP.  Mindful eating encouraged.  We will monitor closely.  4. Obesity, current BMI 47.1 [name]'s labs were not drawn last office visit, because lab could not find her veins.  She is fasting today and wishes to obtain lab work.  She was sent to lab after office visit today.   [name] is currently in the action stage of change. As such, her goal is to continue with weight loss efforts. She has agreed to the Category 3 Plan with lunch options and protein equivalents.  Exercise goals:  As is.  Behavioral modification strategies: increasing water intake, no skipping meals, and meal planning and cooking strategies.  [name] has agreed to follow-up with our clinic in 2 weeks and then in 4 weeks to review labs. She was informed  of the importance of frequent follow-up visits to maximize her success with intensive lifestyle modifications for her multiple health conditions.   [name] was informed we would discuss her lab results at her next visit unless there is a critical issue that needs to be addressed sooner. [name] agreed to keep her next visit at the agreed upon time to discuss these results.  Objective:   Blood pressure 140/86, pulse 85, temperature 98 F (36.7 C), height 5\' 6"  (1.676 m),  weight 291 lb (132 kg), SpO2 99 %. Body mass index is 46.97 kg/m.  General: Cooperative, alert, well developed, in no acute distress. HEENT: Conjunctivae and lids unremarkable. Cardiovascular: Regular rhythm.  Lungs: Normal work of breathing. Neurologic: No focal deficits.   Lab Results  Component Value Date   CREATININE 0.60 10/26/2020   BUN 17 10/26/2020   NA 140 10/26/2020   K 3.5 10/26/2020   CL 104 10/26/2020   CO2 31 09/13/2020   Lab Results  Component Value Date   ALT 13 12/08/2019   AST 14 (L) 12/08/2019   ALKPHOS 69 12/08/2019   BILITOT 0.3 12/08/2019   Lab Results  Component Value Date   HGBA1C 5.7 (H) 09/02/2020   HGBA1C 5.5 11/16/2019   No results found for: "INSULIN" No results found for: "TSH" Lab Results  Component Value Date   TRIG 113 12/08/2019   No results found for: "VD25OH" Lab Results  Component Value Date   WBC 6.7 09/13/2020   HGB 12.9 10/26/2020   HCT 38.0 10/26/2020   MCV 79.2 (L) 09/13/2020   PLT 211 09/13/2020   Lab Results  Component Value Date   IRON 57 11/07/2019   TIBC 132 (L) 11/07/2019   FERRITIN 529 (H) 11/07/2019    Attestation Statements:   Reviewed by clinician on day of visit: allergies, medications, problem list, medical history, surgical history, family history, social history, and previous encounter notes.  Time spent on visit including pre-visit chart review and post-visit care and charting was 45 minutes.   I, 01/07/2020, RMA, am acting as [employer] for Energy manager, DO.  I have reviewed the above documentation for accuracy and completeness, and I agree with the above. -   I have reviewed the above documentation for accuracy and completeness, and I agree with the above. [employer], D.O.  The 21st Century Cures Act was signed into law in 2016 which includes the topic of electronic health records.  This provides immediate access to information in MyChart.  This includes consultation notes,  operative notes, office notes, lab results and pathology reports.  If you have any questions about what you read please let 2017 know at your next visit so we can discuss your concerns and take corrective action if need be.  We are right here with you.

## 2021-10-05 ENCOUNTER — Encounter: Payer: Self-pay | Admitting: Orthopaedic Surgery

## 2021-10-05 ENCOUNTER — Ambulatory Visit: Payer: Commercial Managed Care - PPO | Admitting: Orthopaedic Surgery

## 2021-10-05 DIAGNOSIS — M1712 Unilateral primary osteoarthritis, left knee: Secondary | ICD-10-CM | POA: Diagnosis not present

## 2021-10-05 DIAGNOSIS — M1711 Unilateral primary osteoarthritis, right knee: Secondary | ICD-10-CM | POA: Diagnosis not present

## 2021-10-05 DIAGNOSIS — Z6841 Body Mass Index (BMI) 40.0 and over, adult: Secondary | ICD-10-CM | POA: Insufficient documentation

## 2021-10-05 DIAGNOSIS — M170 Bilateral primary osteoarthritis of knee: Secondary | ICD-10-CM

## 2021-10-05 MED ORDER — BUPIVACAINE HCL 0.5 % IJ SOLN
2.0000 mL | INTRAMUSCULAR | Status: AC | PRN
  Administered 2021-10-05: 2 mL via INTRA_ARTICULAR

## 2021-10-05 MED ORDER — METHYLPREDNISOLONE ACETATE 40 MG/ML IJ SUSP
40.0000 mg | INTRAMUSCULAR | Status: AC | PRN
  Administered 2021-10-05: 40 mg via INTRA_ARTICULAR

## 2021-10-05 MED ORDER — LIDOCAINE HCL 1 % IJ SOLN
2.0000 mL | INTRAMUSCULAR | Status: AC | PRN
  Administered 2021-10-05: 2 mL

## 2021-10-05 NOTE — Progress Notes (Signed)
Office Visit Note   Patient: [name]           Date of Birth: 08/13/1965           MRN: [id number] Visit Date: 10/05/2021              Requested by: Soundra Pilon, FNP [address] [place],  [place] [address] PCP: Soundra Pilon, FNP   Assessment & Plan: Visit Diagnoses:  1. Bilateral primary osteoarthritis of knee   2. Morbid obesity (HCC)     Plan: Impression is bilateral knee DJD.  Cortisone injections administered into both knees today.  She tolerated this well.  She will contact us when she is ready for another round of Visco injections.  Follow-Up Instructions: No follow-ups on file.   Orders:  No orders of the defined types were placed in this encounter.  No orders of the defined types were placed in this encounter.     Procedures: Large Joint Inj: bilateral knee on 10/05/2021 8:10 AM Indications: pain Details: 22 G needle  Arthrogram: No  Medications (Right): 2 mL lidocaine 1 %; 2 mL bupivacaine 0.5 %; 40 mg methylPREDNISolone acetate 40 MG/ML Medications (Left): 2 mL lidocaine 1 %; 2 mL bupivacaine 0.5 %; 40 mg methylPREDNISolone acetate 40 MG/ML Outcome: tolerated well, no immediate complications Patient was prepped and draped in the usual sterile fashion.       Clinical Data: No additional findings.   Subjective: Chief Complaint  Patient presents with   Right Knee - Pain   Left Knee - Pain    HPI Hollick follows up today for bilateral knee DJD.  She is enrolled in the weight loss clinic about 4 weeks ago has been doing really well.  Has already noticed improvement in knee pain.  We did Visco last time and she has also noticed improvement in pain and decreased requirement for medication.  She requests bilateral cortisone injections today. Review of Systems   Objective: Vital Signs: There were no vitals taken for this visit.  Physical Exam  Ortho Exam Examination of bilateral knees is unchanged. Specialty Comments:  No specialty  comments available.  Imaging: No results found.   PMFS History: Patient Active Problem List   Diagnosis Date Noted   Bilateral primary osteoarthritis of knee 10/05/2021   Body mass index 45.0-49.9, adult (HCC) 10/05/2021   Morbid obesity (HCC) 10/05/2021   Essential hypertension 09/29/2021   Other constipation 09/29/2021   GAD (generalized anxiety disorder) 09/29/2021   Refusal of blood transfusions as patient is Jehovah's Witness 09/08/2020   Diverticulitis of sigmoid colon 09/08/2020   Body mass index (BMI) 40.0-44.9, adult (HCC) 05/18/2020   Acquired thrombophilia (HCC) 05/18/2020   Colostomy present (HCC) 05/18/2020   Chronic pain 05/18/2020   Diverticular disease of colon 05/18/2020   Esophageal reflux 05/18/2020   Panic disorder 05/18/2020   Mixed anxiety and depressive disorder 05/18/2020   Irritable bowel syndrome 05/18/2020   Mixed hyperlipidemia 05/18/2020   Presence of IVC filter 12/03/2019   Protein-calorie malnutrition, severe 11/27/2019   Hypertension 11/14/2019   Anemia, chronic disease 11/14/2019   Past Medical History:  Diagnosis Date   Anxiety    Arthritis    Back pain    Constipation    Diverticulitis    Diverticulitis of large intestine with abscess 11/14/2019   DVT (deep venous thrombosis) (HCC)    Hypertension    IBS (irritable bowel syndrome)    Intra-abdominal abscess (HCC) 11/02/2019   Joint pain  Osteoarthritis    Pre-diabetes    Refusal of blood product    Rheumatoid arthritis (HCC)    Severe sepsis (HCC) 11/06/2019   Stomach ulcer    Vitamin D deficiency     Family History  Problem Relation Age of Onset   Hypertension Mother    Diabetes Mother    High Cholesterol Mother    Cancer Father    Diabetes Father    High blood pressure Father    Alcoholism Father     Past Surgical History:  Procedure Laterality Date   ABDOMINAL HYSTERECTOMY     BOWEL RESECTION N/A 11/25/2019   Procedure: SMALL BOWEL RESECTION;  Surgeon: Almond Lint, MD;  Location[facility];  Service: General;  Laterality: N/A;   COLECTOMY N/A 11/25/2019   Procedure: SIGMOID COLECTOMY;  Surgeon: Almond Lint, MD;  Location[facility];  Service: General;  Laterality: N/A;   COLOSTOMY N/A 11/25/2019   Procedure: CREATION OF COLOSTOMY;  Surgeon: Almond Lint, MD;  Location[facility];  Service: General;  Laterality: N/A;   INCISION AND DRAINAGE ABSCESS N/A 11/25/2019   Procedure: INCISION AND DRAINAGE ABDOMINAL ABSCESS;  Surgeon: Almond Lint, MD;  Location[facility];  Service: General;  Laterality: N/A;   IR IVC FILTER PLMT / S&I /IMG GUID/MOD SED  12/03/2019   IVC FILTER REMOVAL N/A 10/26/2020   Procedure: IVC FILTER REMOVAL;  Surgeon: Nada Libman, MD;  [facility];  Service: Cardiovascular;  Laterality: N/A;   LAPAROTOMY N/A 11/25/2019   Procedure: EXPLORATORY LAPAROTOMY;  Surgeon: Almond Lint, MD;  [facility];  Service: General;  Laterality: N/A;   LYSIS OF ADHESION N/A 09/08/2020   Procedure: LYSIS OF ADHESION;  Surgeon: Karie Soda, MD;  Location: [facility];  Service: General;  Laterality: N/A;   PROCTOSCOPY N/A 09/08/2020   Procedure: RIGID PROCTOSCOPY;  Surgeon: Karie Soda, MD;  Location[facility];  Service: General;  Laterality: N/A;   XI ROBOTIC ASSISTED COLOSTOMY TAKEDOWN N/A 09/08/2020   Procedure: XI ROBOTIC ASSISTED OSTOMY TAKEDOWN;TAP BLOCKS, PRIMARY INCISIONAL PERITONEAL HERNIA REPAIR;  Surgeon: Karie Soda, MD;  Location: [facility];  Service: General;  Laterality: N/A;   Social History   Occupational History    Employer: [employer]   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

## 2021-10-10 ENCOUNTER — Ambulatory Visit (INDEPENDENT_AMBULATORY_CARE_PROVIDER_SITE_OTHER): Payer: Commercial Managed Care - PPO | Admitting: Family Medicine

## 2021-10-24 ENCOUNTER — Ambulatory Visit (INDEPENDENT_AMBULATORY_CARE_PROVIDER_SITE_OTHER): Payer: Commercial Managed Care - PPO | Admitting: Family Medicine

## 2021-10-27 ENCOUNTER — Ambulatory Visit (INDEPENDENT_AMBULATORY_CARE_PROVIDER_SITE_OTHER): Payer: Commercial Managed Care - PPO | Admitting: Family Medicine

## 2021-11-09 ENCOUNTER — Encounter (INDEPENDENT_AMBULATORY_CARE_PROVIDER_SITE_OTHER): Payer: Self-pay

## 2021-11-29 ENCOUNTER — Other Ambulatory Visit: Payer: Self-pay | Admitting: Family Medicine

## 2021-11-29 DIAGNOSIS — R110 Nausea: Secondary | ICD-10-CM

## 2021-11-29 DIAGNOSIS — R109 Unspecified abdominal pain: Secondary | ICD-10-CM

## 2021-11-29 DIAGNOSIS — R197 Diarrhea, unspecified: Secondary | ICD-10-CM

## 2021-11-29 DIAGNOSIS — K5792 Diverticulitis of intestine, part unspecified, without perforation or abscess without bleeding: Secondary | ICD-10-CM

## 2022-01-10 ENCOUNTER — Ambulatory Visit: Payer: Commercial Managed Care - PPO | Admitting: Orthopaedic Surgery

## 2022-01-10 ENCOUNTER — Encounter: Payer: Self-pay | Admitting: Orthopaedic Surgery

## 2022-01-10 DIAGNOSIS — M1712 Unilateral primary osteoarthritis, left knee: Secondary | ICD-10-CM

## 2022-01-10 DIAGNOSIS — M1711 Unilateral primary osteoarthritis, right knee: Secondary | ICD-10-CM

## 2022-01-10 DIAGNOSIS — M170 Bilateral primary osteoarthritis of knee: Secondary | ICD-10-CM | POA: Diagnosis not present

## 2022-01-10 MED ORDER — LIDOCAINE HCL 1 % IJ SOLN
2.0000 mL | INTRAMUSCULAR | Status: AC | PRN
  Administered 2022-01-10: 2 mL

## 2022-01-10 MED ORDER — METHYLPREDNISOLONE ACETATE 40 MG/ML IJ SUSP
40.0000 mg | INTRAMUSCULAR | Status: AC | PRN
  Administered 2022-01-10: 40 mg via INTRA_ARTICULAR

## 2022-01-10 MED ORDER — BUPIVACAINE HCL 0.5 % IJ SOLN
2.0000 mL | INTRAMUSCULAR | Status: AC | PRN
  Administered 2022-01-10: 2 mL via INTRA_ARTICULAR

## 2022-01-10 NOTE — Progress Notes (Signed)
Office Visit Note   Patient: [name]           Date of Birth: [birth year]           MRN: [id number] Visit Date: 01/10/2022              Requested by: Gabrielle Pilon, FNP [address] [place],  [place] [address] PCP: Gabrielle Pilon, FNP   Assessment & Plan: Visit Diagnoses:  1. Bilateral primary osteoarthritis of knee     Plan: [name] returns today for bilateral knee pain due to advanced DJD.  Requesting bilateral injections today.  Previous injections done in July wore off a few weeks ago.  She tolerated the injections well today.  We will see her back as needed.  Follow-Up Instructions: No follow-ups on file.   Orders:  No orders of the defined types were placed in this encounter.  No orders of the defined types were placed in this encounter.     Procedures: Large Joint Inj: bilateral knee on 01/10/2022 8:20 AM Indications: pain Details: 22 G needle  Arthrogram: No  Medications (Right): 2 mL lidocaine 1 %; 2 mL bupivacaine 0.5 %; 40 mg methylPREDNISolone acetate 40 MG/ML Medications (Left): 2 mL lidocaine 1 %; 2 mL bupivacaine 0.5 %; 40 mg methylPREDNISolone acetate 40 MG/ML Outcome: tolerated well, no immediate complications Patient was prepped and draped in the usual sterile fashion.       Clinical Data: No additional findings.   Subjective: No chief complaint on file.   HPI  Review of Systems   Objective: Vital Signs: There were no vitals taken for this visit.  Physical Exam  Ortho Exam  Specialty Comments:  No specialty comments available.  Imaging: No results found.   PMFS History: Patient Active Problem List   Diagnosis Date Noted   Bilateral primary osteoarthritis of knee 10/05/2021   Body mass index 45.0-49.9, adult (HCC) 10/05/2021   Morbid obesity (HCC) 10/05/2021   Essential hypertension 09/29/2021   Other constipation 09/29/2021   GAD (generalized anxiety disorder) 09/29/2021   Refusal of blood transfusions as  patient is Jehovah's Witness 09/08/2020   Diverticulitis of sigmoid colon 09/08/2020   Body mass index (BMI) 40.0-44.9, adult (HCC) 05/18/2020   Acquired thrombophilia (HCC) 05/18/2020   Colostomy present (HCC) 05/18/2020   Chronic pain 05/18/2020   Diverticular disease of colon 05/18/2020   Esophageal reflux 05/18/2020   Panic disorder 05/18/2020   Mixed anxiety and depressive disorder 05/18/2020   Irritable bowel syndrome 05/18/2020   Mixed hyperlipidemia 05/18/2020   Presence of IVC filter 12/03/2019   Protein-calorie malnutrition, severe 11/27/2019   Hypertension 11/14/2019   Anemia, chronic disease 11/14/2019   Past Medical History:  Diagnosis Date   Anxiety    Arthritis    Back pain    Constipation    Diverticulitis    Diverticulitis of large intestine with abscess 11/14/2019   DVT (deep venous thrombosis) (HCC)    Hypertension    IBS (irritable bowel syndrome)    Intra-abdominal abscess (HCC) 11/02/2019   Joint pain    Osteoarthritis    Pre-diabetes    Refusal of blood product    Rheumatoid arthritis (HCC)    Severe sepsis (HCC) 11/06/2019   Stomach ulcer    Vitamin D deficiency     Family History  Problem Relation Age of Onset   Hypertension Mother    Diabetes Mother    High Cholesterol Mother    Cancer Father    Diabetes  Father    High blood pressure Father    Alcoholism Father     Past Surgical History:  Procedure Laterality Date   ABDOMINAL HYSTERECTOMY     BOWEL RESECTION N/A 11/25/2019   Procedure: SMALL BOWEL RESECTION;  Surgeon: Stark Klein, MD;  Location: [facility];  Service: General;  Laterality: N/A;   COLECTOMY N/A 11/25/2019   Procedure: SIGMOID COLECTOMY;  Surgeon: Stark Klein, MD;  Location: [facility];  Service: General;  Laterality: N/A;   COLOSTOMY N/A 11/25/2019   Procedure: CREATION OF COLOSTOMY;  Surgeon: Stark Klein, MD;  Location: [facility];  Service: General;  Laterality: N/A;   INCISION AND DRAINAGE ABSCESS N/A 11/25/2019   Procedure:  INCISION AND DRAINAGE ABDOMINAL ABSCESS;  Surgeon: Stark Klein, MD;  Location: [facility];  Service: General;  Laterality: N/A;   IR IVC FILTER PLMT / S&I /IMG GUID/MOD SED  12/03/2019   IVC FILTER REMOVAL N/A 10/26/2020   Procedure: IVC FILTER REMOVAL;  Surgeon: Serafina Mitchell, MD;  Location: [facility];  Service: Cardiovascular;  Laterality: N/A;   LAPAROTOMY N/A 11/25/2019   Procedure: EXPLORATORY LAPAROTOMY;  Surgeon: Stark Klein, MD;  Location: [facility];  Service: General;  Laterality: N/A;   LYSIS OF ADHESION N/A 09/08/2020   Procedure: LYSIS OF ADHESION;  Surgeon: Michael Boston, MD;  [facility];  Service: General;  Laterality: N/A;   PROCTOSCOPY N/A 09/08/2020   Procedure: RIGID PROCTOSCOPY;  Surgeon: Michael Boston, MD;  [facility];  Service: General;  Laterality: N/A;   XI ROBOTIC ASSISTED COLOSTOMY TAKEDOWN N/A 09/08/2020   Procedure: XI ROBOTIC ASSISTED OSTOMY TAKEDOWN;TAP BLOCKS, PRIMARY INCISIONAL [place];  Surgeon: Michael Boston, MD;  [facility];  Service: General;  Laterality: N/A;   Social History   Occupational History    Employer: [employer]   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

## 2022-02-01 ENCOUNTER — Ambulatory Visit: Payer: Self-pay | Admitting: Surgery

## 2022-02-02 ENCOUNTER — Other Ambulatory Visit: Payer: Self-pay | Admitting: Surgery

## 2022-02-02 ENCOUNTER — Encounter: Payer: Self-pay | Admitting: Surgery

## 2022-02-02 DIAGNOSIS — K432 Incisional hernia without obstruction or gangrene: Secondary | ICD-10-CM

## 2022-02-07 ENCOUNTER — Ambulatory Visit
Admission: RE | Admit: 2022-02-07 | Discharge: 2022-02-07 | Disposition: A | Discharge status: Home / Self Care | Payer: Commercial Managed Care - PPO | Source: Ambulatory Visit | Attending: Surgery | Admitting: Surgery

## 2022-02-07 DIAGNOSIS — K432 Incisional hernia without obstruction or gangrene: Secondary | ICD-10-CM

## 2022-02-07 MED ORDER — IOPAMIDOL (ISOVUE-370) INJECTION 76%
100.0000 mL | Freq: Once | INTRAVENOUS | Status: AC | PRN
  Administered 2022-02-07: 100 mL via INTRAVENOUS

## 2022-02-22 IMAGING — CT CT IMAGE GUIDED DRAINAGE BY PERCUTANEOUS CATHETER
1 of 3 series · 13 of 32 positions shown, 18 images · non-contrast
Comparison: none

INDICATION: 54-year-old female with a history of pelvic abscess, trans spatial
TECHNIQUE: Informed written consent was obtained from the patient after a
thorough discussion of the procedural risks, benefits and
alternatives. All questions were addressed. Maximal Sterile Barrier
Technique was utilized including caps, mask, sterile gowns, sterile
gloves, sterile drape, hand hygiene and skin antiseptic. A timeout
was performed prior to the initiation of the procedure.

[Series 2: i-spiral 5.0 b40f · axial · 0.95mm/px · z∈[+911,+1121]mm · 13 of 70 slices shown, 18 images]
[im 5/70  soft-tissue]
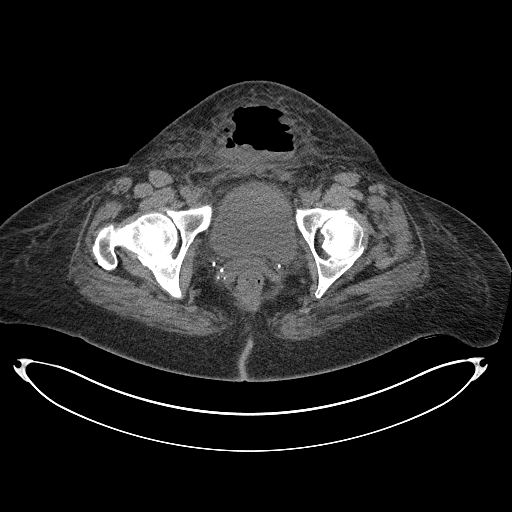
[im 5/70  bone]
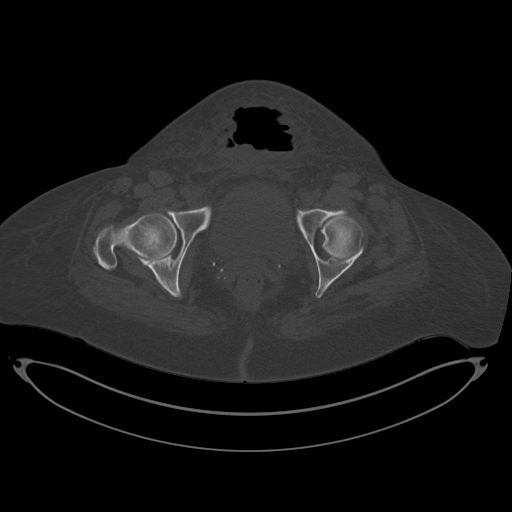
[im 13/70  soft-tissue]
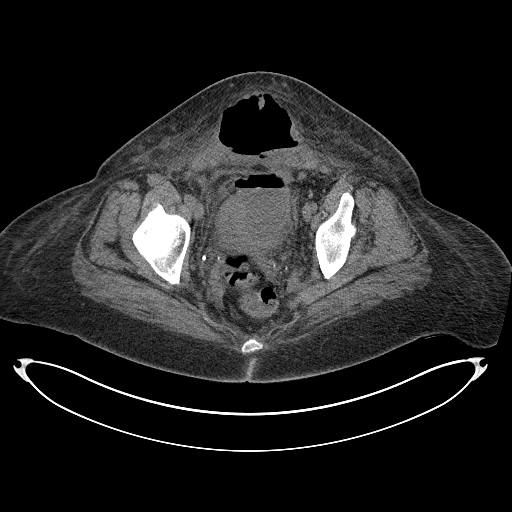
[im 17/70  soft-tissue]
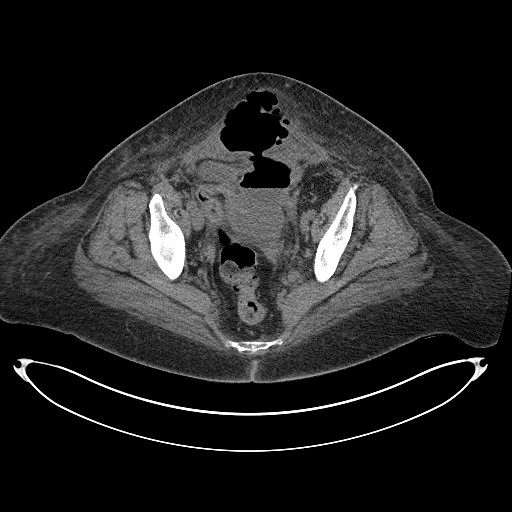
[im 21/70  soft-tissue]
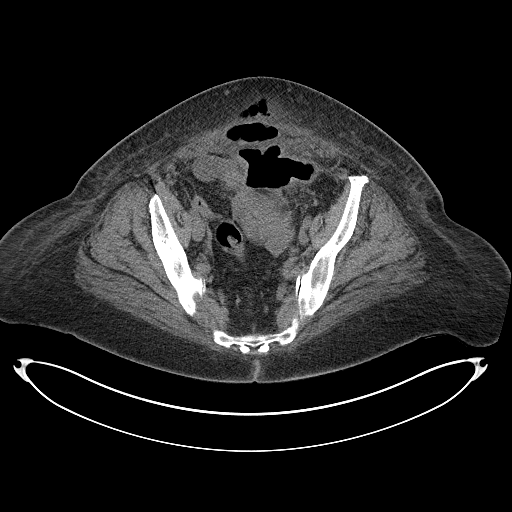
[im 29/70  soft-tissue]
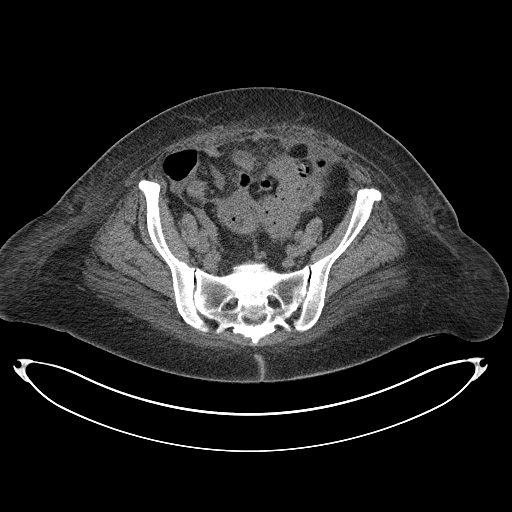
[im 33/70  soft-tissue]
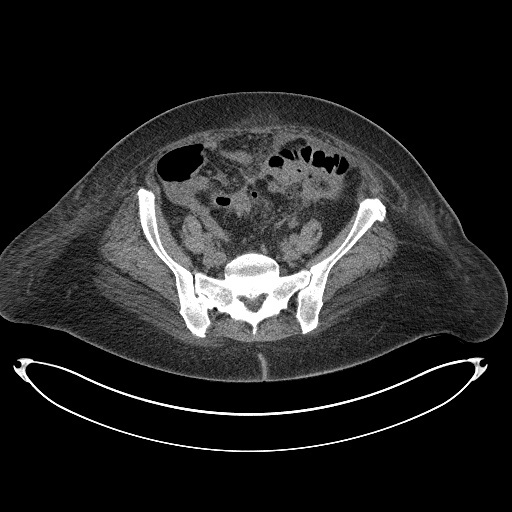
[im 37/70  soft-tissue]
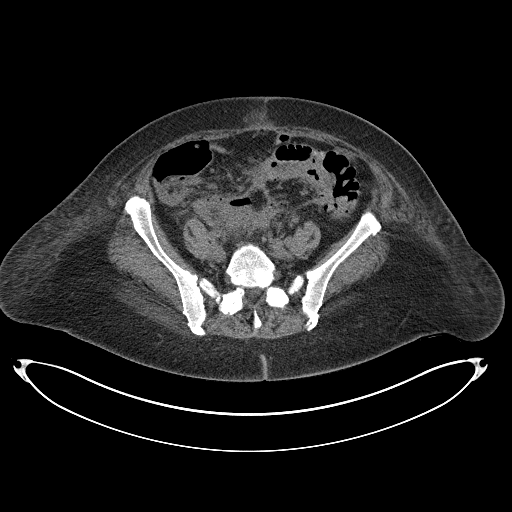
[im 45/70  soft-tissue]
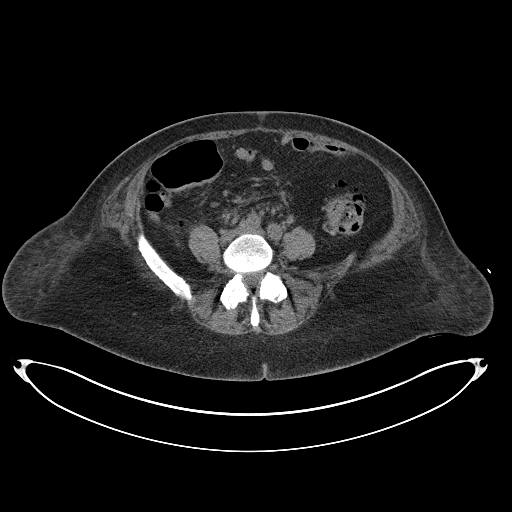
[im 49/70  soft-tissue]
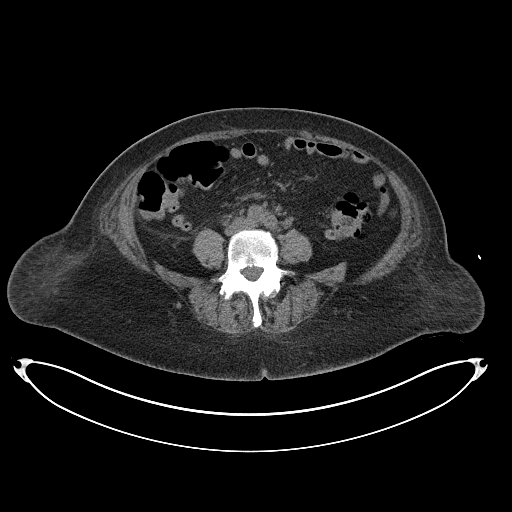
[im 49/70  bone]
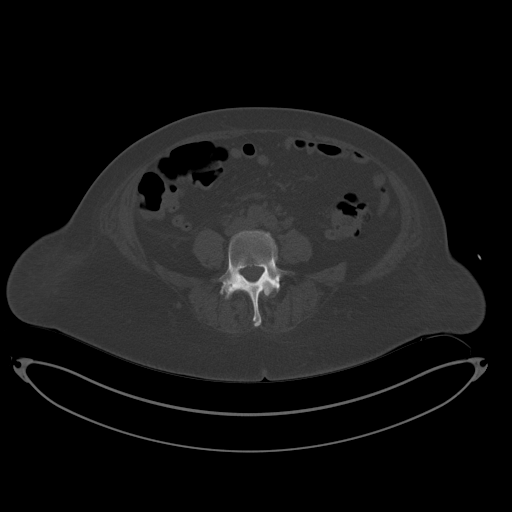
[im 53/70  soft-tissue]
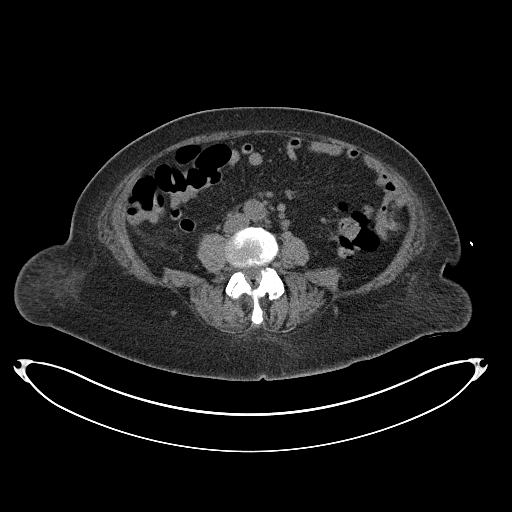
[im 53/70  lung]
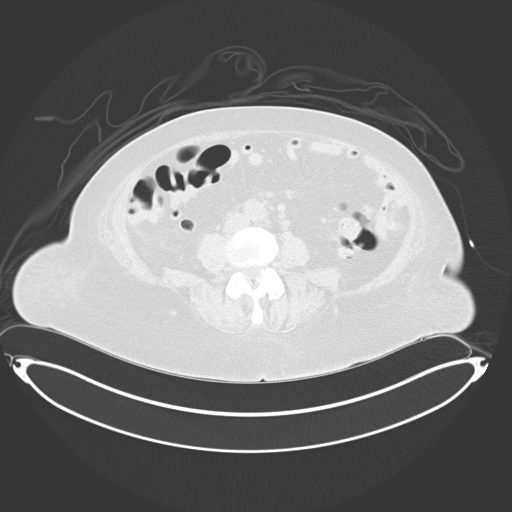
[im 57/70  lung]
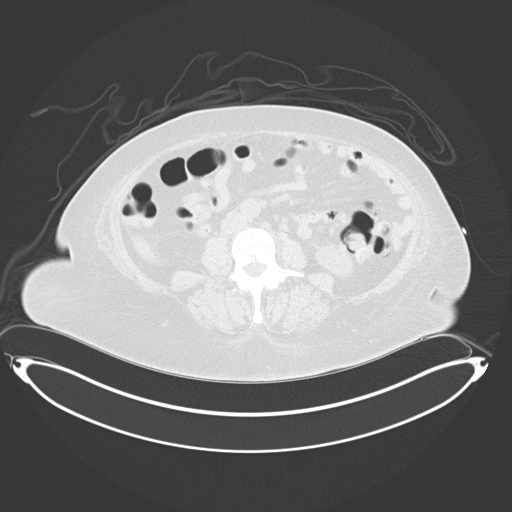
[im 61/70  soft-tissue]
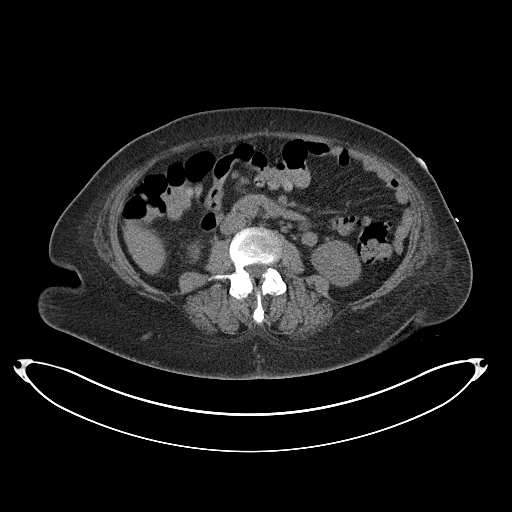
[im 61/70  lung]
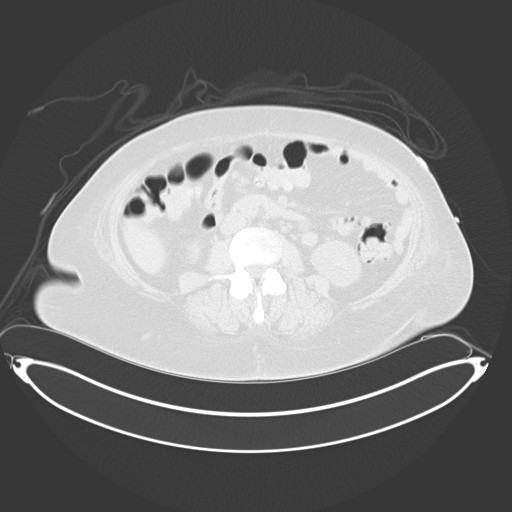
[im 65/70  soft-tissue]
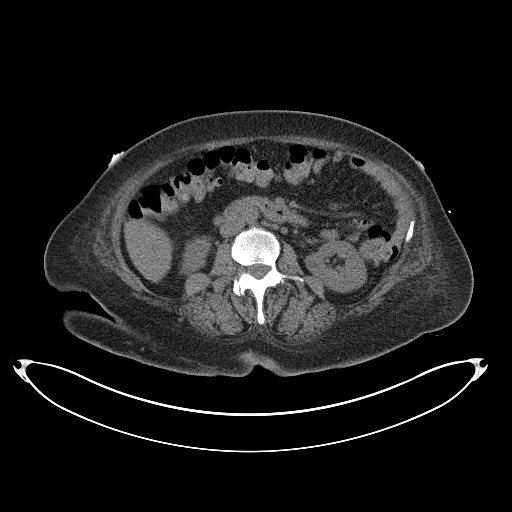
[im 65/70  lung]
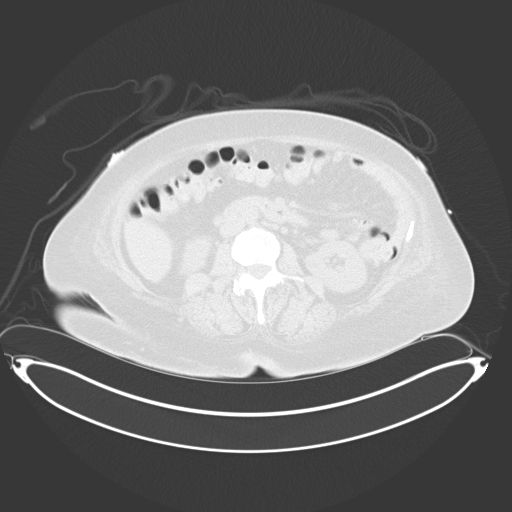

[13 of 32 positions shown; findings below may reference images not displayed]

EXAM:
CT GUIDED DRAINAGE OF  ABSCESS

MEDICATIONS:
The patient is currently admitted to the hospital and receiving
intravenous antibiotics. The antibiotics were administered within an
appropriate time frame prior to the initiation of the procedure.

ANESTHESIA/SEDATION:
1.0 mg IV Versed 50 mcg IV Fentanyl

Moderate Sedation Time:  21 minutes

The patient was continuously monitored during the procedure by the
interventional radiology nurse under my direct supervision.

COMPLICATIONS:
None
PROCEDURE:
The operative field was prepped with Chlorhexidine in a sterile
fashion, and a sterile drape was applied covering the operative
field. A sterile gown and sterile gloves were used for the
procedure. Local anesthesia was provided with 1% Lidocaine.

Once the patient is prepped and draped in the usual sterile fashion,
1% lidocaine was used for local anesthesia.

Two parallel 18 gauge trocar needles were advanced with CT guidance
into the deep abscess and the superficial component of the abscess.
Using modified Seldinger technique, a 12 French drain was placed
into the deep abscess, and separately into the superficial abscess.

Approximately 60 cc of purulent material aspirated from the deep
abscess with a culture sent.

Both catheters were sutured in position and attached to bulb suction
drainage.

Final CT was acquired.

Patient tolerated the procedure well and remained hemodynamically
stable throughout.

No complications were encountered and no significant blood loss.
FINDINGS: CT demonstrates a multi spatial abscess within the pelvis, similar
to the comparison CT.

After drainage placement, there is a 12 French drain from anterior
midline approach to the deep component, and a tangential right of
midline 12 French drain into the superficial component. Both are
attached to bulb drainage.
IMPRESSION: Status post CT-guided drainage with placement of 2 drainage
catheters into a multi spatial abscess of the pelvis.

## 2022-02-22 IMAGING — CT CT IMAGE GUIDED DRAINAGE BY PERCUTANEOUS CATHETER
1 of 3 series · 13 of 32 positions shown, 18 images · non-contrast
Comparison: none

INDICATION: 54-year-old female with a history of pelvic abscess, trans spatial
TECHNIQUE: Informed written consent was obtained from the patient after a
thorough discussion of the procedural risks, benefits and
alternatives. All questions were addressed. Maximal Sterile Barrier
Technique was utilized including caps, mask, sterile gowns, sterile
gloves, sterile drape, hand hygiene and skin antiseptic. A timeout
was performed prior to the initiation of the procedure.

[Series 2: i-spiral 5.0 b40f · axial · 0.95mm/px · z∈[+911,+1121]mm · 13 of 70 slices shown, 18 images]
[im 5/70  soft-tissue]
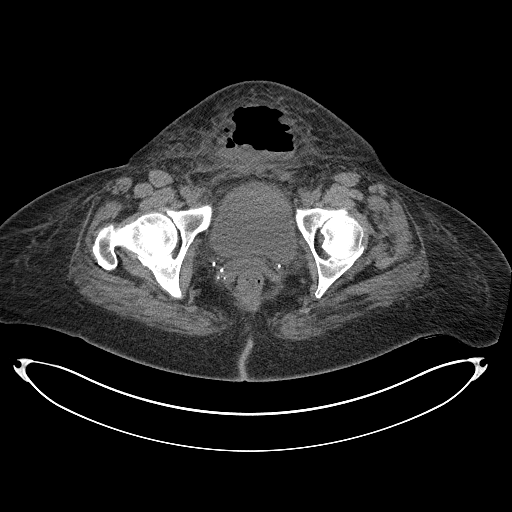
[im 5/70  bone]
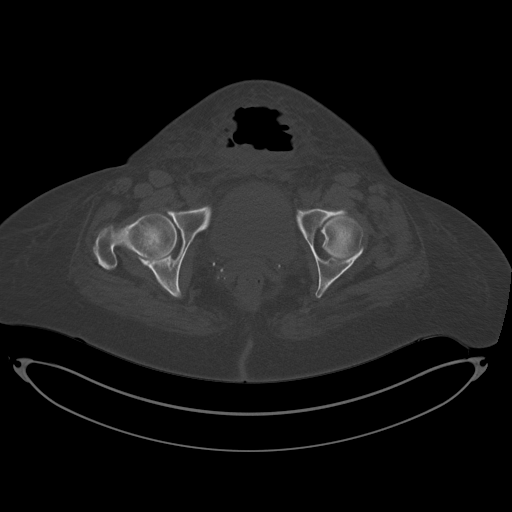
[im 13/70  soft-tissue]
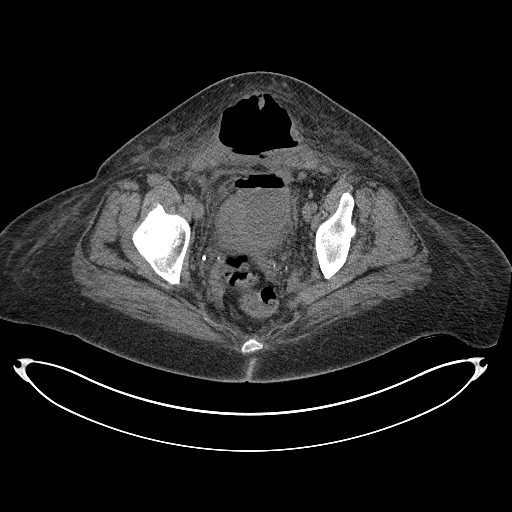
[im 17/70  soft-tissue]
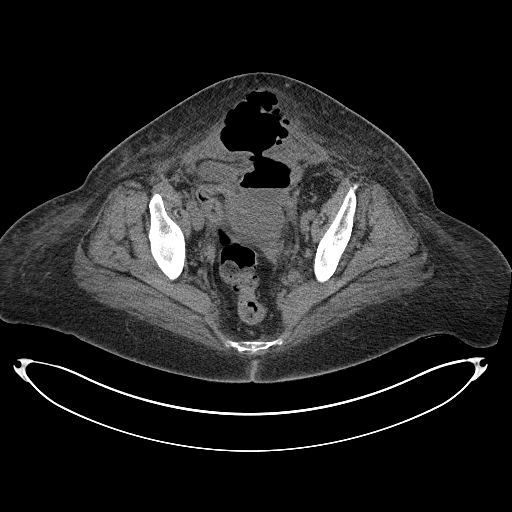
[im 21/70  soft-tissue]
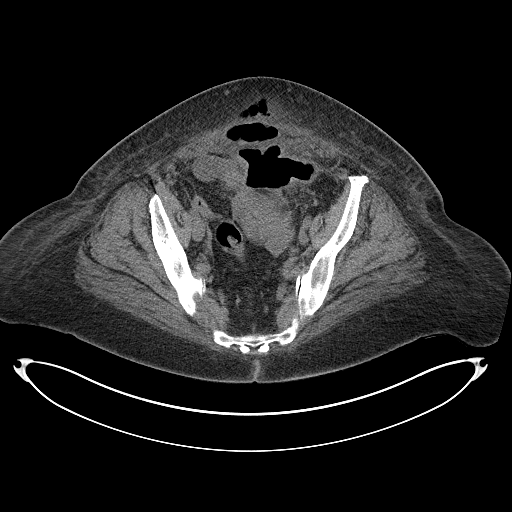
[im 29/70  soft-tissue]
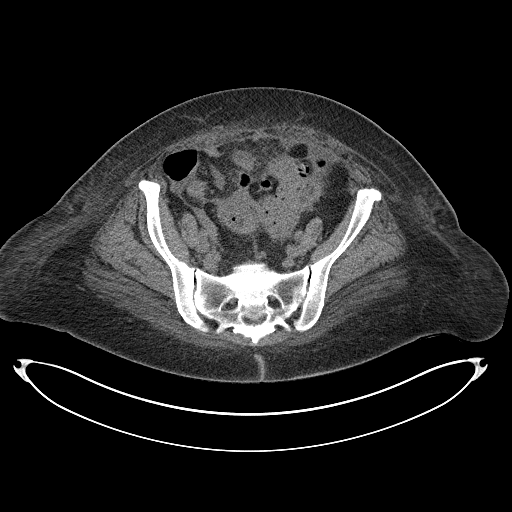
[im 33/70  soft-tissue]
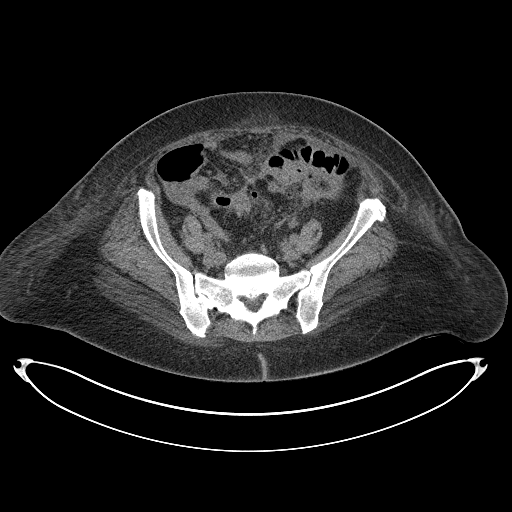
[im 37/70  soft-tissue]
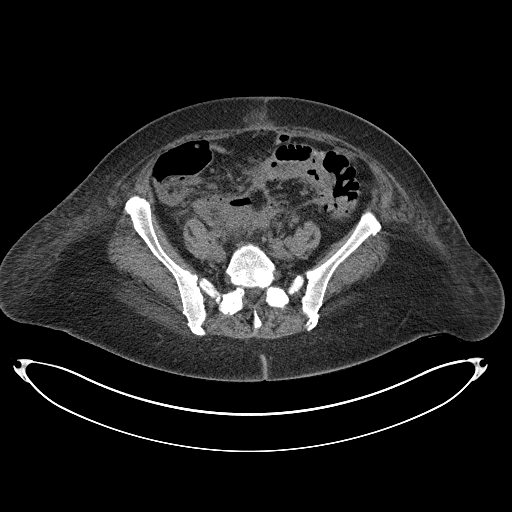
[im 45/70  soft-tissue]
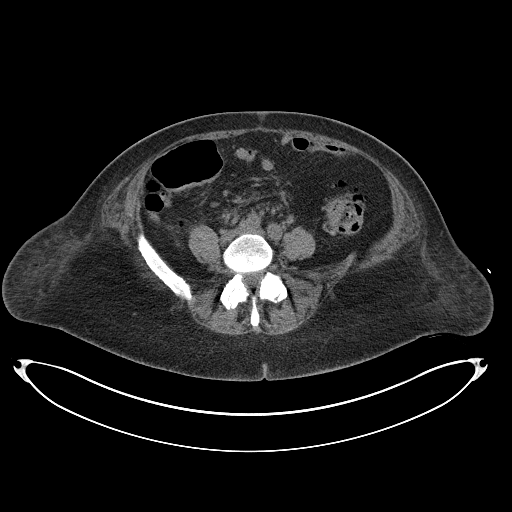
[im 49/70  soft-tissue]
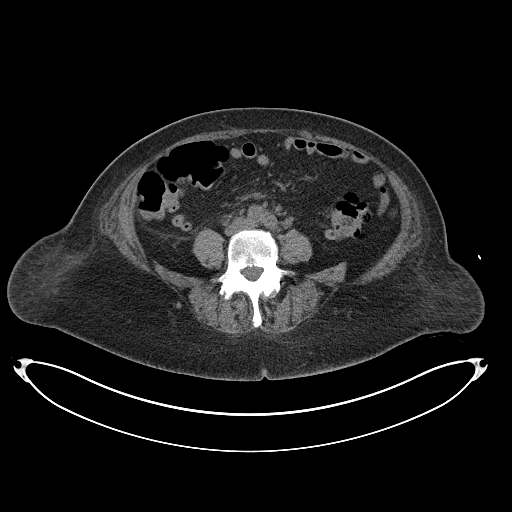
[im 49/70  bone]
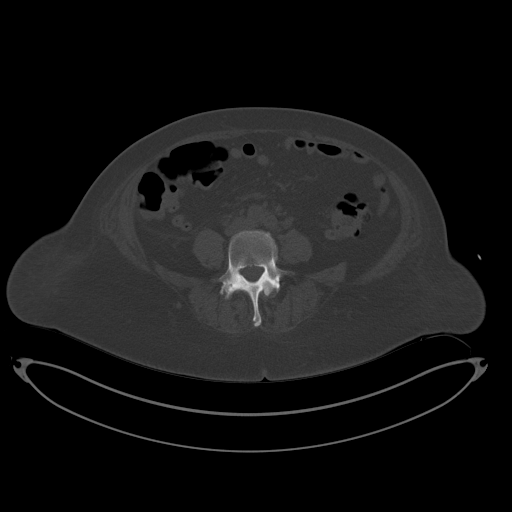
[im 53/70  soft-tissue]
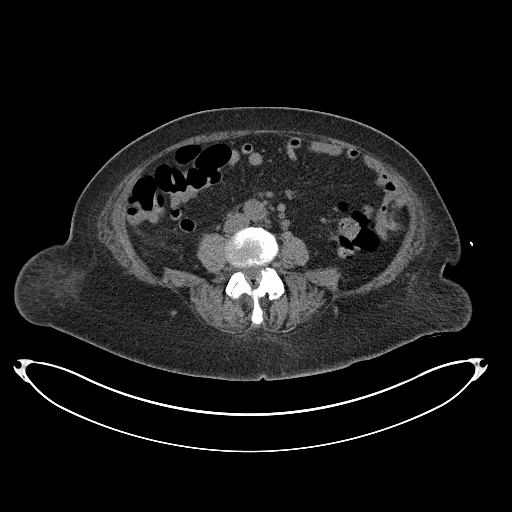
[im 53/70  lung]
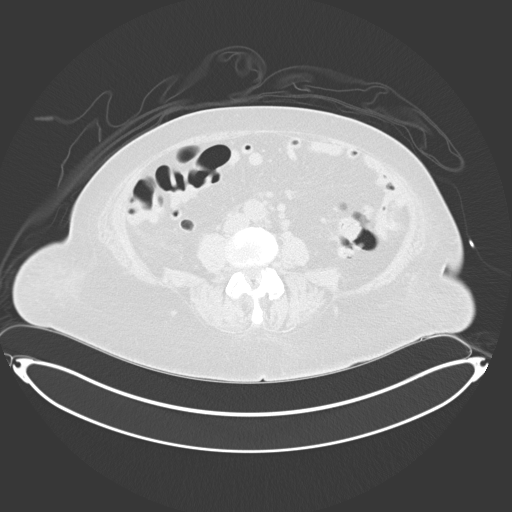
[im 57/70  lung]
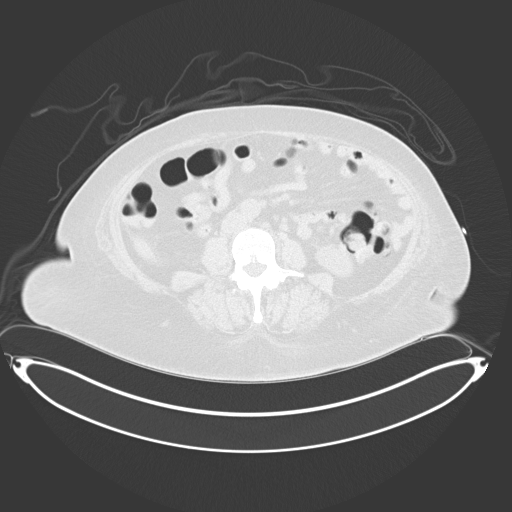
[im 61/70  soft-tissue]
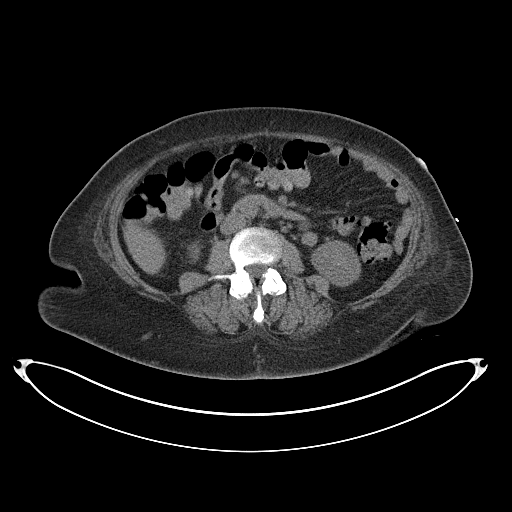
[im 61/70  lung]
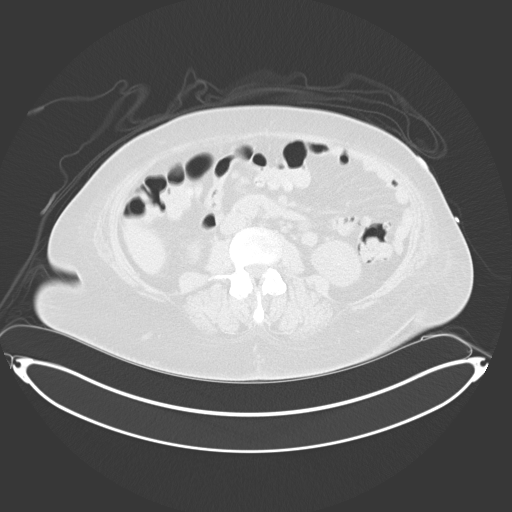
[im 65/70  soft-tissue]
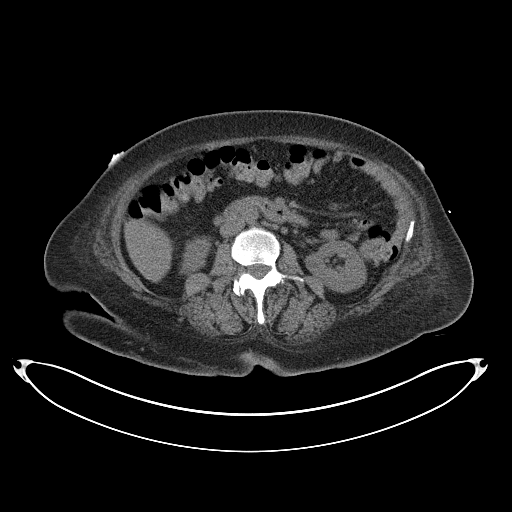
[im 65/70  lung]
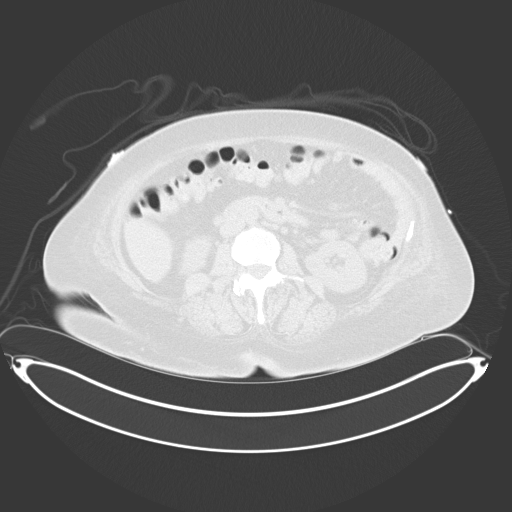

[13 of 32 positions shown; findings below may reference images not displayed]

EXAM:
CT GUIDED DRAINAGE OF  ABSCESS

MEDICATIONS:
The patient is currently admitted to the hospital and receiving
intravenous antibiotics. The antibiotics were administered within an
appropriate time frame prior to the initiation of the procedure.

ANESTHESIA/SEDATION:
1.0 mg IV Versed 50 mcg IV Fentanyl

Moderate Sedation Time:  21 minutes

The patient was continuously monitored during the procedure by the
interventional radiology nurse under my direct supervision.

COMPLICATIONS:
None
PROCEDURE:
The operative field was prepped with Chlorhexidine in a sterile
fashion, and a sterile drape was applied covering the operative
field. A sterile gown and sterile gloves were used for the
procedure. Local anesthesia was provided with 1% Lidocaine.

Once the patient is prepped and draped in the usual sterile fashion,
1% lidocaine was used for local anesthesia.

Two parallel 18 gauge trocar needles were advanced with CT guidance
into the deep abscess and the superficial component of the abscess.
Using modified Seldinger technique, a 12 French drain was placed
into the deep abscess, and separately into the superficial abscess.

Approximately 60 cc of purulent material aspirated from the deep
abscess with a culture sent.

Both catheters were sutured in position and attached to bulb suction
drainage.

Final CT was acquired.

Patient tolerated the procedure well and remained hemodynamically
stable throughout.

No complications were encountered and no significant blood loss.
FINDINGS: CT demonstrates a multi spatial abscess within the pelvis, similar
to the comparison CT.

After drainage placement, there is a 12 French drain from anterior
midline approach to the deep component, and a tangential right of
midline 12 French drain into the superficial component. Both are
attached to bulb drainage.
IMPRESSION: Status post CT-guided drainage with placement of 2 drainage
catheters into a multi spatial abscess of the pelvis.

## 2022-03-06 NOTE — Progress Notes (Addendum)
PCP - [name], FNP Cardiologist - no  PPM/ICD -  Device Orders -  Rep Notified -   Chest x-ray -  EKG -  Stress Test -  ECHO -  Cardiac Cath -   Sleep Study -  CPAP -   Fasting Blood Sugar -  Checks Blood Sugar __0___ times a day  Blood Thinner Instructions: Aspirin Instructions:  ERAS Protcol -y PRE-SURGERY  G2-    COVID vaccine -no  Activity--Able to walk to upstairs bedroom without SOB Anesthesia review: pre DM ,HTN  Patient denies shortness of breath, fever, cough and chest pain at PAT appointment   All instructions explained to the patient, with a verbal understanding of the material. Patient agrees to go over the instructions while at home for a better understanding. Patient also instructed to self quarantine after being tested for COVID-19. The opportunity to ask questions was provided.

## 2022-03-06 NOTE — Patient Instructions (Addendum)
SURGICAL WAITING ROOM VISITATION Patients having surgery or a procedure may have no more than 2 support people in the waiting area - these visitors may rotate.   Children under the age of 62 must have an adult with them who is not the patient. If the patient needs to stay at the hospital during part of their recovery, the visitor guidelines for inpatient rooms apply. Pre-op nurse will coordinate an appropriate time for 1 support person to accompany patient in pre-op.  This support person may not rotate.    Please refer to the [facility] website for the visitor guidelines for Inpatients (after your surgery is over and you are in a regular room).       Your procedure is scheduled on: 09-03-1994   Report to [facility] Main Entrance    Report to admitting at    1115 AM   Call this number if you have problems the morning of surgery [phone]   Do not eat food :After Midnight.   After Midnight you may have the following liquids until _1030_____ AM DAY OF SURGERY  THEN NOTHING BY MOUTH  Water Non-Citrus Juices (without pulp, NO RED) Carbonated Beverages Black Coffee (NO MILK/CREAM OR CREAMERS, sugar ok)  Clear Tea (NO MILK/CREAM OR CREAMERS, sugar ok) regular and decaf                             Plain Jell-O (NO RED)                                           Fruit ices (not with fruit pulp, NO RED)                                     Popsicles (NO RED)                                                               Sports drinks like Gatorade (NO RED)              Drink 2 Ensure/G2 drinks AT 10:00 PM the night before surgery.        The day of surgery:  Drink ONE (1) Pre-Surgery G2 at     1015 AM the morning of surgery. Drink in one sitting. Do not sip.  This drink was given to you during your hospital  pre-op appointment visit. Nothing else to drink after completing the  Pre-Surgery Clear Ensure or G2.          If you have questions, please contact your surgeon's  office.   FOLLOW BOWEL PREP AND ANY ADDITIONAL PRE OP INSTRUCTIONS YOU RECEIVED FROM YOUR SURGEON'S OFFICE!!!     Oral Hygiene is also important to reduce your risk of infection.                                    Remember - BRUSH YOUR TEETH THE MORNING OF SURGERY WITH YOUR REGULAR TOOTHPASTE  DENTURES WILL  BE REMOVED PRIOR TO SURGERY PLEASE DO NOT APPLY "Poly grip" OR ADHESIVES!!!   Do NOT smoke after Midnight   Take these medicines the morning of surgery with A SIP OF WATER: xanax as needed                              You may not have any metal on your body including hair pins, jewelry, and body piercing             Do not wear make-up, lotions, powders, perfumes/cologne, or deodorant  Do not wear nail polish including gel and S&S, artificial/acrylic nails, or any other type of covering on natural nails including finger and toenails. If you have artificial nails, gel coating, etc. that needs to be removed by a nail salon please have this removed prior to surgery or surgery may need to be canceled/ delayed if the surgeon/ anesthesia feels like they are unable to be safely monitored.   Do not shave  48 hours prior to surgery.               Do not bring valuables to the hospital. [place].   Contacts, glasses, dentures or bridgework may not be worn into surgery.   Bring small overnight bag day of surgery.   DO NOT [place]. PHARMACY WILL DISPENSE MEDICATIONS LISTED ON YOUR MEDICATION LIST TO YOU DURING YOUR ADMISSION [place]!    Patients discharged on the day of surgery will not be allowed to drive home.  Someone NEEDS to stay with you for the first 24 hours after anesthesia.               Please read over the following fact sheets you were given: IF [place] [phone]   If you received a COVID test during your pre-op visit  it is  requested that you wear a mask when out in public, stay away from anyone that may not be feeling well and notify your surgeon if you develop symptoms. If you test positive for Covid or have been in contact with anyone that has tested positive in the last 10 days please notify you surgeon.    [place] - Preparing for Surgery Before surgery, you can play an important role.  Because skin is not sterile, your skin needs to be as free of germs as possible.  You can reduce the number of germs on your skin by washing with CHG (chlorahexidine gluconate) soap before surgery.  CHG is an antiseptic cleaner which kills germs and bonds with the skin to continue killing germs even after washing. Please DO NOT use if you have an allergy to CHG or antibacterial soaps.  If your skin becomes reddened/irritated stop using the CHG and inform your nurse when you arrive at Short Stay. Do not shave (including legs and underarms) for at least 48 hours prior to the first CHG shower.  You may shave your face/neck. Please follow these instructions carefully:  1.  Shower with CHG Soap the night before surgery and the  morning of Surgery.  2.  If you choose to wash your hair, wash your hair first as usual with your  normal  shampoo.  3.  After you shampoo, rinse your hair and body  thoroughly to remove the  shampoo.                           4.  Use CHG as you would any other liquid soap.  You can apply chg directly  to the skin and wash                       Gently with a scrungie or clean washcloth.  5.  Apply the CHG Soap to your body ONLY FROM THE NECK DOWN.   Do not use on face/ open                           Wound or open sores. Avoid contact with eyes, ears mouth and genitals (private parts).                       Wash face,  Genitals (private parts) with your normal soap.             6.  Wash thoroughly, paying special attention to the area where your surgery  will be performed.  7.  Thoroughly rinse your body with warm  water from the neck down.  8.  DO NOT shower/wash with your normal soap after using and rinsing off  the CHG Soap.                9.  Pat yourself dry with a clean towel.            10.  Wear clean pajamas.            11.  Place clean sheets on your bed the night of your first shower and do not  sleep with pets. Day of Surgery : Do not apply any lotions/deodorants the morning of surgery.  Please wear clean clothes to the hospital/surgery center.  FAILURE TO FOLLOW THESE INSTRUCTIONS MAY RESULT IN THE CANCELLATION OF YOUR SURGERY PATIENT SIGNATURE_________________________________  NURSE SIGNATURE__________________________________  ________________________________________________________________________

## 2022-03-09 ENCOUNTER — Other Ambulatory Visit: Payer: Self-pay

## 2022-03-09 ENCOUNTER — Encounter (HOSPITAL_COMMUNITY): Payer: Self-pay

## 2022-03-09 ENCOUNTER — Encounter (HOSPITAL_COMMUNITY)
Admission: RE | Admit: 2022-03-09 | Discharge: 2022-03-09 | Disposition: A | Discharge status: Home / Self Care | Payer: Commercial Managed Care - PPO | Source: Ambulatory Visit | Attending: Surgery | Admitting: Surgery

## 2022-03-09 VITALS — HR 99 | Temp 98.1°F | Resp 16 | Ht 66.5 in | Wt 303.0 lb

## 2022-03-09 DIAGNOSIS — I10 Essential (primary) hypertension: Secondary | ICD-10-CM | POA: Diagnosis not present

## 2022-03-09 DIAGNOSIS — R7303 Prediabetes: Secondary | ICD-10-CM | POA: Insufficient documentation

## 2022-03-09 DIAGNOSIS — Z01818 Encounter for other preprocedural examination: Secondary | ICD-10-CM | POA: Insufficient documentation

## 2022-03-09 HISTORY — DX: Nausea with vomiting, unspecified: R11.2

## 2022-03-09 HISTORY — DX: Other specified postprocedural states: Z98.890

## 2022-03-09 HISTORY — DX: Anemia, unspecified: D64.9

## 2022-03-09 LAB — CBC
HCT: 38.1 % (ref 36.0–46.0)
Hemoglobin: 12.8 g/dL (ref 12.0–15.0)
MCH: 26.1 pg (ref 26.0–34.0)
MCHC: 33.6 g/dL (ref 30.0–36.0)
MCV: 77.6 fL — ABNORMAL LOW (ref 80.0–100.0)
Platelets: 207 10*3/uL (ref 150–400)
RBC: 4.91 MIL/uL (ref 3.87–5.11)
RDW: 15.9 % — ABNORMAL HIGH (ref 11.5–15.5)
WBC: 7.8 10*3/uL (ref 4.0–10.5)
nRBC: 0 % (ref 0.0–0.2)

## 2022-03-09 LAB — GLUCOSE, CAPILLARY: Glucose-Capillary: 117 mg/dL — ABNORMAL HIGH (ref 70–99)

## 2022-03-09 LAB — HEMOGLOBIN A1C
Hgb A1c MFr Bld: 6.1 % — ABNORMAL HIGH (ref 4.8–5.6)
Mean Plasma Glucose: 128 mg/dL

## 2022-03-09 LAB — BASIC METABOLIC PANEL
Anion gap: 9 (ref 5–15)
BUN: 16 mg/dL (ref 6–20)
CO2: 24 mmol/L (ref 22–32)
Calcium: 8.8 mg/dL — ABNORMAL LOW (ref 8.9–10.3)
Chloride: 105 mmol/L (ref 98–111)
Creatinine, Ser: 0.73 mg/dL (ref 0.44–1.00)
GFR, Estimated: >60 mL/min (ref >60)
Glucose, Bld: 112 mg/dL — ABNORMAL HIGH (ref 70–99)
Potassium: 3.4 mmol/L — ABNORMAL LOW (ref 3.5–5.1)
Sodium: 138 mmol/L (ref 135–145)

## 2022-03-09 LAB — NO BLOOD PRODUCTS

## 2022-03-09 NOTE — Progress Notes (Signed)
BP elevated at preop [facility] PA aware at preop. Pt. Advised to remember to take her BP med up to day of surgery and monitor BP at home and reach out to PCP if she continues to have high reading. Pt. VU.

## 2022-03-19 NOTE — Anesthesia Preprocedure Evaluation (Signed)
Anesthesia Evaluation  Patient identified by MRN, date of birth, ID band Patient awake    Reviewed: Allergy & Precautions, NPO status , Patient's Chart, lab work & pertinent test results  History of Anesthesia Complications (+) PONV and history of anesthetic complications  Airway Mallampati: III  TM Distance: >3 FB Neck ROM: Full    Dental no notable dental hx. (+) Teeth Intact, Dental Advisory Given   Pulmonary neg pulmonary ROS   Pulmonary exam normal breath sounds clear to auscultation       Cardiovascular hypertension, + DVT  Normal cardiovascular exam Rhythm:Regular Rate:Normal     Neuro/Psych neg Headaches PSYCHIATRIC DISORDERS Anxiety Depression    negative neurological ROS     GI/Hepatic PUD,GERD  ,,  Endo/Other    Morbid obesity  Renal/GU Lab Results      Component                Value               Date                      CREATININE               0.73                03/09/2022                BUN                      16                  03/09/2022                NA                       138                 03/09/2022                K                        3.4 (L)             03/09/2022                CL                       105                 03/09/2022                CO2                      24                  03/09/2022                Musculoskeletal  (+) Arthritis , Osteoarthritis,    Abdominal   Peds  Hematology  (+) JEHOVAH'S WITNESS (Refuses Blood producta)Lab Results      Component                Value               Date  WBC                      7.8                 03/09/2022                HGB                      12.8                03/09/2022                HCT                      38.1                03/09/2022                MCV                      77.6 (L)            03/09/2022                PLT                      207                 03/09/2022               Anesthesia Other Findings ALL: ASA  Reproductive/Obstetrics                              Anesthesia Physical Anesthesia Plan  ASA: 3  Anesthesia Plan: General   Post-op Pain Management: Dilaudid IV and Tylenol PO (pre-op)*   Induction: Intravenous  PONV Risk Score and Plan: 4 or greater and Midazolam, Dexamethasone, Treatment may vary due to age or medical condition, Scopolamine patch - Pre-op and Ondansetron  Airway Management Planned: Oral ETT and Video Laryngoscope Planned  Additional Equipment: None  Intra-op Plan:   Post-operative Plan: Extubation in OR  Informed Consent: I have reviewed the patients History and Physical, chart, labs and discussed the procedure including the risks, benefits and alternatives for the proposed anesthesia with the patient or authorized representative who has indicated his/her understanding and acceptance.     Dental advisory given  Plan Discussed with:   Anesthesia Plan Comments:          Anesthesia Quick Evaluation

## 2022-03-20 ENCOUNTER — Ambulatory Visit (HOSPITAL_COMMUNITY): Payer: Commercial Managed Care - PPO | Admitting: Anesthesiology

## 2022-03-20 ENCOUNTER — Encounter (HOSPITAL_COMMUNITY)
Admission: AD | Disposition: A | Discharge status: Home / Self Care | Payer: Self-pay | Source: Home / Self Care | Attending: Surgery

## 2022-03-20 ENCOUNTER — Other Ambulatory Visit: Payer: Self-pay

## 2022-03-20 ENCOUNTER — Observation Stay (HOSPITAL_COMMUNITY)
Admission: AD | Admit: 2022-03-20 | Discharge: 2022-03-23 | Disposition: A | Discharge status: Home / Self Care | Payer: Commercial Managed Care - PPO | Attending: Surgery | Admitting: Surgery

## 2022-03-20 ENCOUNTER — Encounter (HOSPITAL_COMMUNITY): Payer: Self-pay | Admitting: Surgery

## 2022-03-20 ENCOUNTER — Ambulatory Visit (HOSPITAL_BASED_OUTPATIENT_CLINIC_OR_DEPARTMENT_OTHER): Payer: Commercial Managed Care - PPO | Admitting: Anesthesiology

## 2022-03-20 DIAGNOSIS — Z8249 Family history of ischemic heart disease and other diseases of the circulatory system: Secondary | ICD-10-CM

## 2022-03-20 DIAGNOSIS — K432 Incisional hernia without obstruction or gangrene: Principal | ICD-10-CM | POA: Diagnosis present

## 2022-03-20 DIAGNOSIS — Z811 Family history of alcohol abuse and dependence: Secondary | ICD-10-CM

## 2022-03-20 DIAGNOSIS — Z86718 Personal history of other venous thrombosis and embolism: Secondary | ICD-10-CM | POA: Diagnosis not present

## 2022-03-20 DIAGNOSIS — E559 Vitamin D deficiency, unspecified: Secondary | ICD-10-CM | POA: Diagnosis present

## 2022-03-20 DIAGNOSIS — Z833 Family history of diabetes mellitus: Secondary | ICD-10-CM

## 2022-03-20 DIAGNOSIS — E782 Mixed hyperlipidemia: Secondary | ICD-10-CM | POA: Diagnosis present

## 2022-03-20 DIAGNOSIS — I10 Essential (primary) hypertension: Secondary | ICD-10-CM | POA: Diagnosis not present

## 2022-03-20 DIAGNOSIS — Z79899 Other long term (current) drug therapy: Secondary | ICD-10-CM | POA: Diagnosis not present

## 2022-03-20 DIAGNOSIS — Z6841 Body Mass Index (BMI) 40.0 and over, adult: Secondary | ICD-10-CM

## 2022-03-20 DIAGNOSIS — M069 Rheumatoid arthritis, unspecified: Secondary | ICD-10-CM | POA: Diagnosis present

## 2022-03-20 DIAGNOSIS — K430 Incisional hernia with obstruction, without gangrene: Principal | ICD-10-CM

## 2022-03-20 DIAGNOSIS — Z90710 Acquired absence of both cervix and uterus: Secondary | ICD-10-CM

## 2022-03-20 DIAGNOSIS — Z83438 Family history of other disorder of lipoprotein metabolism and other lipidemia: Secondary | ICD-10-CM

## 2022-03-20 DIAGNOSIS — F419 Anxiety disorder, unspecified: Secondary | ICD-10-CM | POA: Diagnosis present

## 2022-03-20 DIAGNOSIS — F411 Generalized anxiety disorder: Secondary | ICD-10-CM | POA: Diagnosis present

## 2022-03-20 DIAGNOSIS — R7303 Prediabetes: Secondary | ICD-10-CM | POA: Diagnosis present

## 2022-03-20 HISTORY — PX: XI ROBOTIC ASSISTED VENTRAL HERNIA: SHX6789

## 2022-03-20 SURGERY — REPAIR, HERNIA, VENTRAL, ROBOT-ASSISTED
Anesthesia: General

## 2022-03-20 MED ORDER — SUGAMMADEX SODIUM 500 MG/5ML IV SOLN
INTRAVENOUS | Status: DC | PRN
  Administered 2022-03-20: 500 mg via INTRAVENOUS

## 2022-03-20 MED ORDER — OXYCODONE HCL 5 MG PO TABS: 5.0000 mg | ORAL_TABLET | ORAL | Status: DC | PRN

## 2022-03-20 MED ORDER — KETAMINE HCL 10 MG/ML IJ SOLN
INTRAMUSCULAR | Status: DC | PRN
  Administered 2022-03-20 (×2): 20 mg via INTRAVENOUS

## 2022-03-20 MED ORDER — KETAMINE HCL 10 MG/ML IJ SOLN
INTRAMUSCULAR | Status: AC
  Filled 2022-03-20: qty 1

## 2022-03-20 MED ORDER — ALBUTEROL SULFATE HFA 108 (90 BASE) MCG/ACT IN AERS
INHALATION_SPRAY | RESPIRATORY_TRACT | Status: AC
  Filled 2022-03-20: qty 6.7

## 2022-03-20 MED ORDER — ENSURE PRE-SURGERY PO LIQD
296.0000 mL | Freq: Once | ORAL | Status: DC
  Filled 2022-03-20: qty 296

## 2022-03-20 MED ORDER — ONDANSETRON HCL 4 MG/2ML IJ SOLN
4.0000 mg | Freq: Four times a day (QID) | INTRAMUSCULAR | Status: DC | PRN
  Administered 2022-03-20 – 2022-03-21 (×2): 4 mg via INTRAVENOUS
  Filled 2022-03-20 (×3): qty 2

## 2022-03-20 MED ORDER — GABAPENTIN 300 MG PO CAPS
300.0000 mg | ORAL_CAPSULE | Freq: Three times a day (TID) | ORAL | Status: DC
  Administered 2022-03-20 – 2022-03-23 (×8): 300 mg via ORAL
  Filled 2022-03-20 (×8): qty 1

## 2022-03-20 MED ORDER — HYDROCHLOROTHIAZIDE 12.5 MG PO TABS
12.5000 mg | ORAL_TABLET | Freq: Every day | ORAL | Status: DC
  Administered 2022-03-20 – 2022-03-23 (×4): 12.5 mg via ORAL
  Filled 2022-03-20 (×4): qty 1

## 2022-03-20 MED ORDER — POLYETHYLENE GLYCOL 3350 17 G PO PACK
17.0000 g | PACK | Freq: Every day | ORAL | Status: DC
  Administered 2022-03-21 – 2022-03-23 (×3): 17 g via ORAL
  Filled 2022-03-20 (×4): qty 1

## 2022-03-20 MED ORDER — LIDOCAINE HCL (CARDIAC) PF 100 MG/5ML IV SOSY
PREFILLED_SYRINGE | INTRAVENOUS | Status: DC | PRN
  Administered 2022-03-20: 100 mg via INTRAVENOUS

## 2022-03-20 MED ORDER — CHLORHEXIDINE GLUCONATE CLOTH 2 % EX PADS: 6.0000 | MEDICATED_PAD | Freq: Once | CUTANEOUS | Status: DC

## 2022-03-20 MED ORDER — HYDROMORPHONE HCL 1 MG/ML IJ SOLN
1.0000 mg | INTRAMUSCULAR | Status: DC | PRN
  Administered 2022-03-20: 1 mg via INTRAVENOUS
  Filled 2022-03-20 (×2): qty 1

## 2022-03-20 MED ORDER — PROPOFOL 10 MG/ML IV BOLUS
INTRAVENOUS | Status: DC | PRN
  Administered 2022-03-20: 180 mg via INTRAVENOUS

## 2022-03-20 MED ORDER — CELECOXIB 200 MG PO CAPS
200.0000 mg | ORAL_CAPSULE | ORAL | Status: AC
  Administered 2022-03-20: 200 mg via ORAL
  Filled 2022-03-20: qty 1

## 2022-03-20 MED ORDER — ENSURE PRE-SURGERY PO LIQD
592.0000 mL | Freq: Once | ORAL | Status: DC
  Filled 2022-03-20: qty 592

## 2022-03-20 MED ORDER — BUPIVACAINE-EPINEPHRINE (PF) 0.5% -1:200000 IJ SOLN
INTRAMUSCULAR | Status: AC
  Filled 2022-03-20: qty 30

## 2022-03-20 MED ORDER — PROMETHAZINE HCL 25 MG PO TABS: 25.0000 mg | ORAL_TABLET | Freq: Two times a day (BID) | ORAL | Status: DC | PRN

## 2022-03-20 MED ORDER — ACETAMINOPHEN 500 MG PO TABS
1000.0000 mg | ORAL_TABLET | ORAL | Status: AC
  Administered 2022-03-20: 1000 mg via ORAL
  Filled 2022-03-20: qty 2

## 2022-03-20 MED ORDER — SCOPOLAMINE 1 MG/3DAYS TD PT72: 1.0000 | MEDICATED_PATCH | TRANSDERMAL | Status: DC

## 2022-03-20 MED ORDER — LACTATED RINGERS IV SOLN: INTRAVENOUS | Status: DC

## 2022-03-20 MED ORDER — CHLORHEXIDINE GLUCONATE 0.12 % MT SOLN: 15.0000 mL | Freq: Once | OROMUCOSAL | Status: DC

## 2022-03-20 MED ORDER — HYDROMORPHONE HCL 1 MG/ML IJ SOLN
INTRAMUSCULAR | Status: DC | PRN
  Administered 2022-03-20: 2 mg via INTRAVENOUS

## 2022-03-20 MED ORDER — DEXAMETHASONE SODIUM PHOSPHATE 10 MG/ML IJ SOLN
INTRAMUSCULAR | Status: DC | PRN
  Administered 2022-03-20: 8 mg via INTRAVENOUS

## 2022-03-20 MED ORDER — PROCHLORPERAZINE MALEATE 10 MG PO TABS: 10.0000 mg | ORAL_TABLET | Freq: Four times a day (QID) | ORAL | Status: DC | PRN

## 2022-03-20 MED ORDER — BUPIVACAINE-EPINEPHRINE 0.5% -1:200000 IJ SOLN
INTRAMUSCULAR | Status: DC | PRN
  Administered 2022-03-20: 30 mL

## 2022-03-20 MED ORDER — ONDANSETRON 4 MG PO TBDP: 4.0000 mg | ORAL_TABLET | Freq: Four times a day (QID) | ORAL | Status: DC | PRN

## 2022-03-20 MED ORDER — ORAL CARE MOUTH RINSE: 15.0000 mL | Freq: Once | OROMUCOSAL | Status: DC

## 2022-03-20 MED ORDER — BUPIVACAINE LIPOSOME 1.3 % IJ SUSP
INTRAMUSCULAR | Status: AC
  Filled 2022-03-20: qty 20

## 2022-03-20 MED ORDER — PROCHLORPERAZINE EDISYLATE 10 MG/2ML IJ SOLN
5.0000 mg | Freq: Four times a day (QID) | INTRAMUSCULAR | Status: DC | PRN
  Administered 2022-03-20: 10 mg via INTRAVENOUS
  Filled 2022-03-20: qty 2

## 2022-03-20 MED ORDER — MIDAZOLAM HCL 5 MG/5ML IJ SOLN
INTRAMUSCULAR | Status: DC | PRN
  Administered 2022-03-20: 2 mg via INTRAVENOUS

## 2022-03-20 MED ORDER — ROCURONIUM BROMIDE 100 MG/10ML IV SOLN
INTRAVENOUS | Status: DC | PRN
  Administered 2022-03-20: 80 mg via INTRAVENOUS
  Administered 2022-03-20 (×2): 20 mg via INTRAVENOUS

## 2022-03-20 MED ORDER — 0.9 % SODIUM CHLORIDE (POUR BTL) OPTIME
TOPICAL | Status: DC | PRN
  Administered 2022-03-20: 1000 mL

## 2022-03-20 MED ORDER — CEFAZOLIN IN SODIUM CHLORIDE 3-0.9 GM/100ML-% IV SOLN
3.0000 g | INTRAVENOUS | Status: AC
  Administered 2022-03-20: 3 g via INTRAVENOUS
  Filled 2022-03-20: qty 100

## 2022-03-20 MED ORDER — ALBUTEROL SULFATE HFA 108 (90 BASE) MCG/ACT IN AERS
INHALATION_SPRAY | RESPIRATORY_TRACT | Status: DC | PRN
  Administered 2022-03-20 (×2): 4 via RESPIRATORY_TRACT

## 2022-03-20 MED ORDER — METHOCARBAMOL 1000 MG/10ML IJ SOLN
500.0000 mg | Freq: Four times a day (QID) | INTRAVENOUS | Status: DC | PRN
  Administered 2022-03-21: 500 mg via INTRAVENOUS
  Filled 2022-03-20: qty 500

## 2022-03-20 MED ORDER — PHENYLEPHRINE HCL (PRESSORS) 10 MG/ML IV SOLN
INTRAVENOUS | Status: DC | PRN
  Administered 2022-03-20 (×3): 80 ug via INTRAVENOUS
  Administered 2022-03-20: 160 ug via INTRAVENOUS
  Administered 2022-03-20 (×3): 80 ug via INTRAVENOUS

## 2022-03-20 MED ORDER — ONDANSETRON HCL 4 MG/2ML IJ SOLN
INTRAMUSCULAR | Status: DC | PRN
  Administered 2022-03-20: 4 mg via INTRAVENOUS

## 2022-03-20 MED ORDER — OXYCODONE HCL 5 MG PO TABS
10.0000 mg | ORAL_TABLET | ORAL | Status: DC | PRN
  Administered 2022-03-21 (×2): 10 mg via ORAL
  Filled 2022-03-20 (×2): qty 2

## 2022-03-20 MED ORDER — ENOXAPARIN SODIUM 40 MG/0.4ML IJ SOSY
40.0000 mg | PREFILLED_SYRINGE | INTRAMUSCULAR | Status: DC
  Administered 2022-03-21 – 2022-03-23 (×3): 40 mg via SUBCUTANEOUS
  Filled 2022-03-20 (×3): qty 0.4

## 2022-03-20 MED ORDER — GABAPENTIN 300 MG PO CAPS
300.0000 mg | ORAL_CAPSULE | ORAL | Status: AC
  Administered 2022-03-20: 300 mg via ORAL
  Filled 2022-03-20: qty 1

## 2022-03-20 MED ORDER — BUPIVACAINE LIPOSOME 1.3 % IJ SUSP: 20.0000 mL | Freq: Once | INTRAMUSCULAR | Status: DC

## 2022-03-20 MED ORDER — ALPRAZOLAM 0.5 MG PO TABS
0.5000 mg | ORAL_TABLET | Freq: Every day | ORAL | Status: DC | PRN
  Administered 2022-03-20 – 2022-03-22 (×3): 0.5 mg via ORAL
  Filled 2022-03-20 (×3): qty 1

## 2022-03-20 MED ORDER — BUPIVACAINE LIPOSOME 1.3 % IJ SUSP
INTRAMUSCULAR | Status: DC | PRN
  Administered 2022-03-20: 20 mL

## 2022-03-20 MED ORDER — LOSARTAN POTASSIUM-HCTZ 50-12.5 MG PO TABS: 1.0000 | ORAL_TABLET | Freq: Every day | ORAL | Status: DC

## 2022-03-20 MED ORDER — HYDROMORPHONE HCL 2 MG/ML IJ SOLN
INTRAMUSCULAR | Status: AC
  Filled 2022-03-20: qty 1

## 2022-03-20 MED ORDER — ACETAMINOPHEN 325 MG PO TABS
650.0000 mg | ORAL_TABLET | Freq: Four times a day (QID) | ORAL | Status: DC
  Filled 2022-03-20: qty 2

## 2022-03-20 MED ORDER — FENTANYL CITRATE (PF) 250 MCG/5ML IJ SOLN
INTRAMUSCULAR | Status: AC
  Filled 2022-03-20: qty 5

## 2022-03-20 MED ORDER — MIDAZOLAM HCL 2 MG/2ML IJ SOLN
INTRAMUSCULAR | Status: AC
  Filled 2022-03-20: qty 2

## 2022-03-20 MED ORDER — FENTANYL CITRATE (PF) 100 MCG/2ML IJ SOLN
INTRAMUSCULAR | Status: DC | PRN
  Administered 2022-03-20 (×2): 100 ug via INTRAVENOUS

## 2022-03-20 MED ORDER — LOSARTAN POTASSIUM 50 MG PO TABS
50.0000 mg | ORAL_TABLET | Freq: Every day | ORAL | Status: DC
  Administered 2022-03-20 – 2022-03-23 (×4): 50 mg via ORAL
  Filled 2022-03-20 (×5): qty 1

## 2022-03-20 MED ORDER — SIMETHICONE 80 MG PO CHEW
40.0000 mg | CHEWABLE_TABLET | Freq: Four times a day (QID) | ORAL | Status: DC | PRN
  Administered 2022-03-20: 40 mg via ORAL
  Filled 2022-03-20: qty 1

## 2022-03-20 SURGICAL SUPPLY — 60 items
ANTIFOG SOL W/FOAM PAD STRL (MISCELLANEOUS) ×1
BAG COUNTER SPONGE SURGICOUNT (BAG) ×1 IMPLANT
BLADE SURG SZ11 CARB STEEL (BLADE) ×1 IMPLANT
CHLORAPREP W/TINT 26 (MISCELLANEOUS) ×1 IMPLANT
COVER MAYO STAND STRL (DRAPES) ×1 IMPLANT
COVER TIP SHEARS 8 DVNC (MISCELLANEOUS) ×1 IMPLANT
COVER TIP SHEARS 8MM DA VINCI (MISCELLANEOUS) ×1
DERMABOND ADVANCED .7 DNX12 (GAUZE/BANDAGES/DRESSINGS) IMPLANT
DEVICE TROCAR PUNCTURE CLOSURE (ENDOMECHANICALS) IMPLANT
DRAPE ARM DVNC X/XI (DISPOSABLE) ×3 IMPLANT
DRAPE COLUMN DVNC XI (DISPOSABLE) ×1 IMPLANT
DRAPE DA VINCI XI ARM (DISPOSABLE) ×3
DRAPE DA VINCI XI COLUMN (DISPOSABLE) ×1
ELECT L-HOOK LAP 45CM DISP (ELECTROSURGICAL) ×1
ELECT PENCIL ROCKER SW 15FT (MISCELLANEOUS) ×1 IMPLANT
ELECT REM PT RETURN 15FT ADLT (MISCELLANEOUS) ×1 IMPLANT
ELECTRODE L-HOOK LAP 45CM DISP (ELECTROSURGICAL) ×1 IMPLANT
GLOVE BIO SURGEON STRL SZ7.5 (GLOVE) ×2 IMPLANT
GLOVE BIOGEL PI IND STRL 8 (GLOVE) ×2 IMPLANT
GOWN STRL REUS W/ TWL XL LVL3 (GOWN DISPOSABLE) ×3 IMPLANT
GOWN STRL REUS W/TWL XL LVL3 (GOWN DISPOSABLE) ×3
GRASPER SUT TROCAR 14GX15 (MISCELLANEOUS) IMPLANT
IRRIG SUCT STRYKERFLOW 2 WTIP (MISCELLANEOUS)
IRRIGATION SUCT STRKRFLW 2 WTP (MISCELLANEOUS) IMPLANT
KIT BASIN OR (CUSTOM PROCEDURE TRAY) ×1 IMPLANT
KIT TURNOVER KIT A (KITS) IMPLANT
MANIFOLD NEPTUNE II (INSTRUMENTS) ×1 IMPLANT
MESH SOFT 12X12IN BARD (Mesh General) IMPLANT
NDL SPNL 18GX3.5 QUINCKE PK (NEEDLE) ×1 IMPLANT
NEEDLE SPNL 18GX3.5 QUINCKE PK (NEEDLE) ×1 IMPLANT
PACK CARDIOVASCULAR III (CUSTOM PROCEDURE TRAY) ×1 IMPLANT
SEAL CANN UNIV 5-8 DVNC XI (MISCELLANEOUS) ×3 IMPLANT
SEAL XI 5MM-8MM UNIVERSAL (MISCELLANEOUS) ×3
SEALER VESSEL DA VINCI XI (MISCELLANEOUS)
SEALER VESSEL EXT DVNC XI (MISCELLANEOUS) IMPLANT
SET TUBE SMOKE EVAC HIGH FLOW (TUBING) ×1 IMPLANT
SOLUTION ANTFG W/FOAM PAD STRL (MISCELLANEOUS) ×1 IMPLANT
SOLUTION ELECTROLUBE (MISCELLANEOUS) ×1 IMPLANT
SPIKE FLUID TRANSFER (MISCELLANEOUS) ×1 IMPLANT
SUT MNCRL AB 4-0 PS2 18 (SUTURE) ×1 IMPLANT
SUT STRAFIX PDS 18 CTX (SUTURE) IMPLANT
SUT STRAFIX SYMMETRIC 0-0 24 (SUTURE)
SUT STRAFIX SYMMETRIC 1-0 12 (SUTURE)
SUT STRAFIX SYMMETRIC 1-0 24 (SUTURE) ×1
SUT STRTFX SPIRAL PDS+ 2-0 23 (SUTURE)
SUT VIC AB 3-0 SH 27 (SUTURE) ×1
SUT VIC AB 3-0 SH 27X BRD (SUTURE) IMPLANT
SUT VLOC 180 2-0 9IN GS21 (SUTURE) IMPLANT
SUTURE STRAFIX SYMMETRC 0-0 24 (SUTURE) IMPLANT
SUTURE STRAFIX SYMMETRC 1-0 12 (SUTURE) IMPLANT
SUTURE STRAFIX SYMMETRC 1-0 24 (SUTURE) IMPLANT
SUTURE STRTFX SPRL PDS+ 2-0 23 (SUTURE) IMPLANT
SYR 20ML LL LF (SYRINGE) ×1 IMPLANT
TAPE STRIPS DRAPE STRL (GAUZE/BANDAGES/DRESSINGS) ×1 IMPLANT
TOWEL OR 17X26 10 PK STRL BLUE (TOWEL DISPOSABLE) ×1 IMPLANT
TOWEL OR NON WOVEN STRL DISP B (DISPOSABLE) IMPLANT
TROCAR ADV FIXATION 12X100MM (TROCAR) ×1 IMPLANT
TROCAR Z-THREAD FIOS 5X100MM (TROCAR) ×1 IMPLANT
TROCAR Z-THREAD OPTICAL 5X100M (TROCAR) IMPLANT
TUBING INSUFFLATION 10FT LAP (TUBING) ×1 IMPLANT

## 2022-03-20 NOTE — Op Note (Signed)
Patient: Shyasia Zermeno (1965/09/25, [id number])  Date of Surgery: 03/20/2022   Preoperative Diagnosis: INCISIONAL HERNIA   Postoperative Diagnosis: INCISIONAL HERNIA   Surgical Procedure:  ROBOTIC INCISIONAL HERNIA REPAIR WITH MESH:   Bilateral posterior rectus myofascial release Bilateral transversus abdominis myofascial release  Operative Team Members:  Surgeon(s) and Role:    * Natika Geyer, Hyman Hopes, MD - Primary   CRNA: Elyn Peers, CRNA; Shanon Payor, CRNA   Anesthesia: General   Fluids:  Total I/O In: 900 [I.V.:800; IV Piggyback:100] Out: -   Complications: None  Drains:  None  Specimen: * No specimens in log *   Disposition:  PACU - hemodynamically stable.  Plan of Care: Admit for overnight observation  Indications for Procedure: Anakin Uvalle is a 56 y.o. female with a history of ostomy takedown, who presented with incisional hernia.  Measured approximately 14 cm wide by 7 cm tall on preoperative CT scan.  I recommended robotic ventral hernia repair with mesh.  The procedure itself as well as the risks, benefits and alternatives were described.  The risks discussed included but were not limited to the risk of infection, bleeding, damage to nearby structures, recurrent hernia, chronic pain, and mesh complication requiring removal.  After a full discussion and all questions answered, the patient granted consent to proceed.  Findings:  Hernia Location: Ventral hernia location: Umbilical (M3) and Infraumbilical (M4) Hernia Size:  15 cm wide x 9.5 cm tall  Mesh Size &Type:  30 cm tall by 27 cm wide Bard Soft Mesh Mesh Position: [place] - Retromuscular Myofascial Releases:  Bilateral posterior rectus myofascial release Bilateral transversus abdominis myofascial release   Description of Procedure: The patient was positioned supine, moderately flexed at the umbilical level, padded and secured on the operating table.  A timeout procedure was performed.     What is described is a robotic, totally extraperitoneal retromuscular incisional hernia repair with bilateral rectus myofascial release and retromuscular mesh placement.  Laparoscopic Portion: The retrorectus space was entered in the LEFT hypochondrium, at approximately the midclavicular line utilizing a 5 mm optical-viewing trocar.  Upon safe entry into this space, it was insufflated while performing a blunt dissection with the camera still in the optical trocar. A rectus myofascial release was performed on the LEFT side. Dissection was carried out laterally in the retromuscular plane to the edge of the rectus sheath progressively disconnecting the rectus muscle from the underlying posterior rectus sheath. Both the segmental innervation as well as the intercostal artery and vein brances to the rectus muscle were individually preserved.    During the left sided retrorectus dissection, a 12 mm trocar was placed into the lateral most edge of the retrorectus space.  With these initial trocars in position, the medial most aspect of the retrorectus plane was identified, and the posterior sheath was visualized as it inserted on the linea alba. The posterior sheath was incised with cautery entering the preperitoneal plane. A crossover was performed dissecting under the linea alba in the preperitoneal plane until the right rectus sheath was identified.  After identification of the right rectus sheath, it was incised vertically to enter the retrorectus space on the right. A rectus myofascial release was performed on the RIGHT side.  Blunt dissection was carried out laterally in the retromuscular plane to the edge of the rectus sheath progressively disconnecting the rectus muscle from the underlying posterior rectus sheath. Both the segmental innervation as well as the intercostal artery and vein brances to the rectus muscle  were individually preserved.   At this juncture, both retrorectus planes were initially  connected to each other and there was space for further trocar placement. An 8 mm robotic trocar was placed in the midclavicular line in right retrorectus space.  A 40mm robotic trocar was placed within the left rectus musculature in the upper abdomen, and not through the linea alba.  The initial 5 mm access trocar in the midclavicular line within the left retrorectus space was switched out for an 8 mm robotic trocar.   Robotic Portion: The Intuitive daVinci Xi surgical robot was docked in the standard fashion and the procedure begun from the robotic console. A Prograsp instrument and monopolar shears were used for the dissection.  Dissection was carried down inferiorly preserving the peritoneum and the preperitoneal fat in the midline as it was gently dissected off of the overlying linea alba.  On the right side, the posterior rectus sheath was progressively disconnected from its insertion on the linea alba. This allowed for progression of the right side rectus myofascial release.  The rectus myofascial release accomplished medialization of the posterior rectus sheath towards the midline and disinsertion of the rectus muscle from its surrounding fascia, and thus its encasement in the rectus sheath, allowing for widening of the rectus muscle and transfer of the rectus flap towards the midline.  This will allow for future inset of the medial aspect of the flap for abdominal wall reconstruction.  Similarly, on the left side, the posterior rectus sheath was also progressively disconnected from its insertion on the linea alba.  This allowed for progression of the left side rectus myofascial release.  The rectus myofascial release accomplished medialization of the posterior rectus sheath towards the midline and disinsertion of the rectus muscle from its surrounding fascia, and thus its encasement in the rectus sheath, allowing for widening of the rectus muscle and transfer of the rectus flap towards the midline.   This will allow for future inset of the medial aspect of the flap for abdominal wall reconstruction.  During the dissection of the midline and the left rectus sheath, the hernia defects were identified and the hernia sac was not reducible, therefore the hernia peritoneum was incised circumferentially around the edge of the hernia defect which left a defect within the peritoneum.  These defects were later closed with a running 2-0 v-loc suture.  Both the left and the right rectus myofascial releases were performed towards the lower abdomen, past the arcuate line bilaterally.  During this dissection, the peritoneum and preperitoneal fat in the midline were further preserved below the hernia as they were dissected off of the overlying linea alba.   The hernia defect area was now visualized fully.  The hernia defects were located in the umbilical and infraumbilical regions. Utilizing a metric ruler, the defect are was measured intracorporeally to be 15 cm horizontal by 9.5 cm vertical.  The two defects in the peritoneum were near the same level and the posterior layer would not be able to be closed without additional tissue mobilization.  The hernia defect in the left rectus sheath from the previous ostomy site was near the semilunar line, which also required additional lateral dissection for adequate mesh coverage.  I decided to perform bilateral transversus abdominis releases.  A transversus abdominis release (TAR) was performed on the left side.  The transversus abdominis muscle was identified deep to the posterior rectus sheath and incised vertically along its entire length, entering the pre-peritoneal or pre-transversalis fascia plane.  This disinserted the transversus abdominis muscle from the linea semilunaris.  Since the intercostal nerves, arteries and veins had been preserved during the rectus myofascial release portion of the procedure, they remained intact during the TAR. The peritoneum was  subsequently peeled away from the underside of the divided transversus abdominis muscle.  This dissection was carried out laterally towards the retroperitoneum.  The TAR accomplished additional medialization of the posterior rectus sheath with its attached peritoneum towards the midline to allow for visceral sac closure.  The TAR also provided further offset of tension of the rectus muscle flap with additional transfer of the rectus muscle towards the midline, as it remained attached to the external and internal abdominal oblique muscles.  This will allow for future inset of the medial aspect of the flap for abdominal wall reconstruction.   A transversus abdominis release (TAR) was performed on the right side.  The transversus abdominis muscle was identified deep to the posterior rectus sheath and incised vertically along its entire length, entering the pre-peritoneal or pre-transversalis fascia plane.  This disinserted the transversus abdominis muscle from the linea semilunaris.  Since the intercostal nerves, arteries and veins had been preserved during the rectus myofascial release portion of the procedure, they remained intact during the TAR. The peritoneum was subsequently peeled away from the underside of the divided transversus abdominis muscle.  This dissection was carried out laterally towards the retroperitoneum.  The TAR accomplished additional medialization of the posterior rectus sheath with its attached peritoneum towards the midline to allow for visceral sac closure.  The TAR also provided further offset of tension of the rectus muscle flap with additional transfer of the rectus muscle towards the midline, as it remained attached to the external and internal abdominal oblique muscles.  This will allow for future inset of the medial aspect of the flap for abdominal wall reconstruction.   The hernia defects were closed utilizing a continuous, #1 Ethicon Stratafix Symmetric PDS Plus suture.  The  previous ostomy site was closed vertically as the defect was vertically oriented.  The midline defect was closed vertically in standard fashion.  The hernia defect, and subsequently the rectus musculature, came together well for a complete abdominal wall reconstruction.  The dissected out retrorectus space was measured with a metric ruler so as to determine the size of the proposed mesh.    The robot was undocked and the laparoscope was inserted, inspecting for hemostasis.  The mesh deployment was performed laparoscopically.  Laparoscopic Portion:  A transversus abdominis plane (TAP) block was performed bilaterally with a mixture of marcaine and Exparel.  The anesthetic was first injected into the plane between the transversus abdominis and internal abdominal oblique muscles on the left. The TAP was repeated on the contralateral side.   A piece of Bard Soft was opened and trimmed to 30 cm tall x 27 cm wide. The mesh was advanced into the retrorectus space and the mesh positioned flat against the intact posterior rectus sheaths. The mesh was not fixated as it occupied the entire retromuscular plane, and also covered all of the trocars.  The trocars were removed and the skin closed with 4-0 Monocryl subcuticular sutures and skin glue.   Louanna Raw, MD General, Bariatric, & Minimally Invasive Surgery [facility], [place]

## 2022-03-20 NOTE — H&P (Signed)
Admitting Physician: Hyman Hopes Bralyn Espino  Service: General surgery  CC: hernia  Subjective   HPI: [name] is a 56 y.o. female who is seen as an office consultation for evaluation of New Consultation (3 hernias - pain/burning sensation ) .   [name] has developed incisional hernia at her previous left abdominal ostomy site and midline laparotomy incision. She has associated pain and nausea with these bulges. She would like these repaired. She denies any changes in her bowel habits or vomiting.     Past Medical History:  Diagnosis Date   Anemia    Anxiety    Arthritis    Back pain    Constipation    Diverticulitis    Diverticulitis of large intestine with abscess 11/14/2019   DVT (deep venous thrombosis) (HCC)    Hypertension    IBS (irritable bowel syndrome)    Intra-abdominal abscess (HCC) 11/02/2019   Joint pain    Osteoarthritis    PONV (postoperative nausea and vomiting)    Pre-diabetes    Refusal of blood product    Rheumatoid arthritis (HCC)    Severe sepsis (HCC) 11/06/2019   Stomach ulcer    Vitamin D deficiency     Past Surgical History:  Procedure Laterality Date   ABDOMINAL HYSTERECTOMY     BOWEL RESECTION N/A 11/25/2019   Procedure: SMALL BOWEL RESECTION;  Surgeon: Almond Lint, MD;  Location: [facility];  Service: General;  Laterality: N/A;   COLECTOMY N/A 11/25/2019   Procedure: SIGMOID COLECTOMY;  Surgeon: Almond Lint, MD;  Location: [facility];  Service: General;  Laterality: N/A;   COLOSTOMY N/A 11/25/2019   Procedure: CREATION OF COLOSTOMY;  Surgeon: Almond Lint, MD;  Location: [facility];  Service: General;  Laterality: N/A;   INCISION AND DRAINAGE ABSCESS N/A 11/25/2019   Procedure: INCISION AND DRAINAGE ABDOMINAL ABSCESS;  Surgeon: Almond Lint, MD;  Location: [facility];  Service: General;  Laterality: N/A;   IR IVC FILTER PLMT / S&I /IMG GUID/MOD SED  12/03/2019   IVC FILTER REMOVAL N/A 10/26/2020   Procedure: IVC FILTER REMOVAL;  Surgeon:  Nada Libman, MD;  Location: [facility];  Service: Cardiovascular;  Laterality: N/A;   LAPAROTOMY N/A 11/25/2019   Procedure: EXPLORATORY LAPAROTOMY;  Surgeon: Almond Lint, MD;  Location: [facility];  Service: General;  Laterality: N/A;   LYSIS OF ADHESION N/A 09/08/2020   Procedure: LYSIS OF ADHESION;  Surgeon: Karie Soda, MD;  Location: [facility];  Service: General;  Laterality: N/A;   PROCTOSCOPY N/A 09/08/2020   Procedure: RIGID PROCTOSCOPY;  Surgeon: Karie Soda, MD;  Location: [facility];  Service: General;  Laterality: N/A;   XI ROBOTIC ASSISTED COLOSTOMY TAKEDOWN N/A 09/08/2020   Procedure: XI ROBOTIC ASSISTED OSTOMY TAKEDOWN;TAP BLOCKS, PRIMARY INCISIONAL PERITONEAL HERNIA REPAIR;  Surgeon: Karie Soda, MD;  Location: [facility];  Service: General;  Laterality: N/A;    Family History  Problem Relation Age of Onset   Hypertension Mother    Diabetes Mother    High Cholesterol Mother    Cancer Father    Diabetes Father    High blood pressure Father    Alcoholism Father     Social:  reports that she has never smoked. She has never used smokeless tobacco. She reports that she does not currently use alcohol. She reports that she does not use drugs.  Allergies:  Allergies  Allergen Reactions   Other     Blood refusal   Aspirin Diarrhea    stomach  ache.     Medications: Current Outpatient Medications  Medication Instructions   ALPRAZolam (XANAX) 0.5 mg, Oral, Daily PRN   cholecalciferol (VITAMIN D3) 1,000 Units, Oral, Daily   losartan-hydrochlorothiazide (HYZAAR) 50-12.5 MG per tablet 1 tablet, Daily   Omega 3 1,000 mg, Oral, Daily   polyethylene glycol (MIRALAX / GLYCOLAX) 17 g, Oral, Daily   promethazine (PHENERGAN) 25 mg, Oral, Every 12 hours PRN    ROS - all of the below systems have been reviewed with the patient and positives are indicated with bold text General: chills, fever or night sweats Eyes: blurry vision or double vision ENT: epistaxis or sore  throat Allergy/Immunology: itchy/watery eyes or nasal congestion Hematologic/Lymphatic: bleeding problems, blood clots or swollen lymph nodes Endocrine: temperature intolerance or unexpected weight changes Breast: new or changing breast lumps or nipple discharge Resp: cough, shortness of breath, or wheezing CV: chest pain or dyspnea on exertion GI: as per HPI GU: dysuria, trouble voiding, or hematuria MSK: joint pain or joint stiffness Neuro: TIA or stroke symptoms Derm: pruritus and skin lesion changes Psych: anxiety and depression  Objective   PE Blood pressure (!) 144/82, height 5' 6.5" (1.689 m), weight (!) 137.4 kg, SpO2 100 %. Constitutional: NAD; conversant; no deformities Eyes: Moist conjunctiva; no lid lag; anicteric; PERRL Neck: Trachea midline; no thyromegaly Lungs: Normal respiratory effort; no tactile fremitus CV: RRR; no palpable thrills; no pitting edema GI: Abd -lower midline laparotomy incision well-healed at this point with some scarring. Left mid abdomen ostomy site well-healed. Palpable bulge in the midline underlying the laparotomy incision as well as the left abdomen underlying the ostomy site  MSK: Normal range of motion of extremities; no clubbing/cyanosis Psychiatric: Appropriate affect; alert and oriented x3 Lymphatic: No palpable cervical or axillary lymphadenopathy  No results found for this or any previous visit (from the past 24 hour(s)).  Imaging Orders  No imaging studies ordered today   CT Abd/Pel 02/07/22  1. Ventral abdominal hernia of the left hemiabdomen containing a nondilated loop of colon and additional smaller fat containing ventral abdominal wall hernias. 2. Punctate nonobstructing stone of the lower pole the left kidney.  Approximately 14 cm wide by 7 cm tall hernia area including a midline incisional defect and left sided ostomy site defect through rectus muscle.   Assessment and Plan   Incisional hernia, without obstruction or  gangrene   Measures approximately 14 cm wide by 7 cm tall  I recommended robotic incisional hernia repair with mesh. The procedure itself as well as its risk, benefits, and alternatives were discussed with the patient. After full discussion all questions answered patient granted consent to proceed.    Quentin Ore, MD  [facility] Use [email or web address] to contact on call provider

## 2022-03-20 NOTE — Transfer of Care (Signed)
Immediate Anesthesia Transfer of Care Note  Patient: [name]  Procedure(s) Performed: ROBOTIC INCISIONAL HERNIA REPAIR WITH MESH  Patient Location: PACU  Anesthesia Type:General  Level of Consciousness: drowsy and patient cooperative  Airway & Oxygen Therapy: Patient Spontanous Breathing and Patient connected to face mask oxygen  Post-op Assessment: Report given to RN, Post -op Vital signs reviewed and stable, and Patient moving all extremities X 4  Post vital signs: Reviewed and stable  Last Vitals:  Vitals Value Taken Time  BP 155/86 03/20/22 1700  Temp 36.7 C 03/20/22 1700  Pulse 97 03/20/22 1708  Resp 28 03/20/22 1708  SpO2 100 % 03/20/22 1708  Vitals shown include unvalidated device data.  Last Pain:  Vitals:   03/20/22 1700  PainSc: Asleep      Patients Stated Pain Goal: 5 (03/20/22 1133)  Complications: No notable events documented.

## 2022-03-20 NOTE — Anesthesia Postprocedure Evaluation (Signed)
Anesthesia Post Note  Patient: [name]  Procedure(s) Performed: ROBOTIC INCISIONAL HERNIA REPAIR WITH MESH     Patient location during evaluation: PACU Anesthesia Type: General Level of consciousness: awake and alert Pain management: pain level controlled Vital Signs Assessment: post-procedure vital signs reviewed and stable Respiratory status: spontaneous breathing, nonlabored ventilation, respiratory function stable and patient connected to nasal cannula oxygen Cardiovascular status: blood pressure returned to baseline and stable Postop Assessment: no apparent nausea or vomiting Anesthetic complications: no  No notable events documented.  Last Vitals:  Vitals:   03/20/22 2102 03/20/22 2152  BP: 119/73 122/64  Pulse: (!) 103 97  Resp: 18 18  Temp: 36.6 C 36.4 C  SpO2: 100% 100%    Last Pain:  Vitals:   03/20/22 2152  TempSrc: Oral  PainSc:                  Trevor Iha

## 2022-03-20 NOTE — Anesthesia Procedure Notes (Signed)
Procedure Name: Intubation Date/Time: 03/20/2022 1:09 PM  Performed by: Jonna Munro, CRNAPre-anesthesia Checklist: Patient identified, Emergency Drugs available, Suction available, Patient being monitored and Timeout performed Patient Re-evaluated:Patient Re-evaluated prior to induction Oxygen Delivery Method: Circle system utilized Preoxygenation: Pre-oxygenation with 100% oxygen Induction Type: IV induction Ventilation: Mask ventilation without difficulty Laryngoscope Size: Mac, Glidescope and 3 Grade View: Grade I Tube type: Oral Tube size: 7.0 mm Number of attempts: 1 Airway Equipment and Method: Rigid stylet and Video-laryngoscopy Placement Confirmation: ETT inserted through vocal cords under direct vision, positive ETCO2 and CO2 detector Secured at: 23 cm Tube secured with: Tape Dental Injury: Teeth and Oropharynx as per pre-operative assessment

## 2022-03-21 ENCOUNTER — Other Ambulatory Visit: Payer: Self-pay

## 2022-03-21 ENCOUNTER — Encounter (HOSPITAL_COMMUNITY): Payer: Self-pay | Admitting: Surgery

## 2022-03-21 DIAGNOSIS — Z8249 Family history of ischemic heart disease and other diseases of the circulatory system: Secondary | ICD-10-CM | POA: Diagnosis not present

## 2022-03-21 DIAGNOSIS — E559 Vitamin D deficiency, unspecified: Secondary | ICD-10-CM | POA: Diagnosis present

## 2022-03-21 DIAGNOSIS — Z86718 Personal history of other venous thrombosis and embolism: Secondary | ICD-10-CM | POA: Diagnosis not present

## 2022-03-21 DIAGNOSIS — R7303 Prediabetes: Secondary | ICD-10-CM | POA: Diagnosis present

## 2022-03-21 DIAGNOSIS — Z811 Family history of alcohol abuse and dependence: Secondary | ICD-10-CM | POA: Diagnosis not present

## 2022-03-21 DIAGNOSIS — Z833 Family history of diabetes mellitus: Secondary | ICD-10-CM | POA: Diagnosis not present

## 2022-03-21 DIAGNOSIS — Z90710 Acquired absence of both cervix and uterus: Secondary | ICD-10-CM | POA: Diagnosis not present

## 2022-03-21 DIAGNOSIS — I10 Essential (primary) hypertension: Secondary | ICD-10-CM | POA: Diagnosis present

## 2022-03-21 DIAGNOSIS — Z83438 Family history of other disorder of lipoprotein metabolism and other lipidemia: Secondary | ICD-10-CM | POA: Diagnosis not present

## 2022-03-21 DIAGNOSIS — Z6841 Body Mass Index (BMI) 40.0 and over, adult: Secondary | ICD-10-CM | POA: Diagnosis not present

## 2022-03-21 DIAGNOSIS — E782 Mixed hyperlipidemia: Secondary | ICD-10-CM | POA: Diagnosis present

## 2022-03-21 DIAGNOSIS — F411 Generalized anxiety disorder: Secondary | ICD-10-CM | POA: Diagnosis present

## 2022-03-21 DIAGNOSIS — K432 Incisional hernia without obstruction or gangrene: Secondary | ICD-10-CM | POA: Diagnosis not present

## 2022-03-21 DIAGNOSIS — Z79899 Other long term (current) drug therapy: Secondary | ICD-10-CM | POA: Diagnosis not present

## 2022-03-21 DIAGNOSIS — M069 Rheumatoid arthritis, unspecified: Secondary | ICD-10-CM | POA: Diagnosis present

## 2022-03-21 DIAGNOSIS — F419 Anxiety disorder, unspecified: Secondary | ICD-10-CM | POA: Diagnosis present

## 2022-03-21 LAB — BASIC METABOLIC PANEL
Anion gap: 9 (ref 5–15)
BUN: 12 mg/dL (ref 6–20)
CO2: 24 mmol/L (ref 22–32)
Calcium: 8.8 mg/dL — ABNORMAL LOW (ref 8.9–10.3)
Chloride: 104 mmol/L (ref 98–111)
Creatinine, Ser: 0.67 mg/dL (ref 0.44–1.00)
GFR, Estimated: >60 mL/min (ref >60)
Glucose, Bld: 140 mg/dL — ABNORMAL HIGH (ref 70–99)
Potassium: 4.7 mmol/L (ref 3.5–5.1)
Sodium: 137 mmol/L (ref 135–145)

## 2022-03-21 LAB — CBC
HCT: 35.2 % — ABNORMAL LOW (ref 36.0–46.0)
Hemoglobin: 12.1 g/dL (ref 12.0–15.0)
MCH: 26.3 pg (ref 26.0–34.0)
MCHC: 34.4 g/dL (ref 30.0–36.0)
MCV: 76.5 fL — ABNORMAL LOW (ref 80.0–100.0)
Platelets: 221 10*3/uL (ref 150–400)
RBC: 4.6 MIL/uL (ref 3.87–5.11)
RDW: 15.5 % (ref 11.5–15.5)
WBC: 14.1 10*3/uL — ABNORMAL HIGH (ref 4.0–10.5)
nRBC: 0 % (ref 0.0–0.2)

## 2022-03-21 MED ORDER — HYDROCODONE-ACETAMINOPHEN 5-325 MG PO TABS
1.0000 | ORAL_TABLET | ORAL | Status: DC | PRN
  Administered 2022-03-21 – 2022-03-23 (×8): 2 via ORAL
  Filled 2022-03-21 (×8): qty 2

## 2022-03-21 NOTE — Evaluation (Signed)
Physical Therapy Evaluation-1x Patient Details Name: [name] MRN: [id number] DOB: 01-09-1966 Today's Date: 03/21/2022  History of Present Illness  56 yo female s/p hernia repair with mesh 12/18. Hx of hernia, RA, OA, DVT, chronic pain, IVC filter 2021, obesity  Clinical Impression  On eval, pt was Mod Ind with mobility. She walked ~350 feet with a RW for safety (only due to pt reporting mild lightheadedness). No LOB. Lightheadedness did not worsen with activity. Pt tolerated activity well. She is eager to mobilize! Recommend ambulation several times a day with either nursing or mobility team. No acute PT needs at this time. 1x eval. Will sign off.        Recommendations for follow up therapy are one component of a multi-disciplinary discharge planning process, led by the attending physician.  Recommendations may be updated based on patient status, additional functional criteria and insurance authorization.  Follow Up Recommendations No PT follow up      Assistance Recommended at Discharge PRN  Patient can return home with the following  A little help with walking and/or transfers;A little help with bathing/dressing/bathroom;Assist for transportation;Assistance with cooking/housework    Equipment Recommendations None recommended by PT  Recommendations for Other Services       Functional Status Assessment Patient has had a recent decline in their functional status and demonstrates the ability to make significant improvements in function in a reasonable and predictable amount of time.     Precautions / Restrictions Precautions Precaution Comments: abd surgery Restrictions Weight Bearing Restrictions: No      Mobility  Bed Mobility               General bed mobility comments: oob in recliner    Transfers Overall transfer level: Modified independent                      Ambulation/Gait Ambulation/Gait assistance: Modified independent (Device/Increase  time) Gait Distance (Feet): 300 Feet Assistive device: Rolling walker (2 wheels) Gait Pattern/deviations: Step-through pattern, Decreased stride length       General Gait Details: Mild lightheadedness. No LOB. Pt tolerated distance well  Stairs            Wheelchair Mobility    Modified Rankin (Stroke Patients Only)       Balance Overall balance assessment: Mild deficits observed, not formally tested                                           Pertinent Vitals/Pain Pain Assessment Pain Assessment: 0-10 Pain Score: 7  Pain Location: abdomen    Home Living Family/patient expects to be discharged to:: Private residence Living Arrangements: Other relatives   Type of Home: House Home Access: Level entry       Home Layout: Able to live on main level with bedroom/bathroom;Two level Home Equipment: None      Prior Function Prior Level of Function : Independent/Modified Independent                     Hand Dominance        Extremity/Trunk Assessment   Upper Extremity Assessment Upper Extremity Assessment: Defer to OT evaluation    Lower Extremity Assessment Lower Extremity Assessment: Overall WFL for tasks assessed    Cervical / Trunk Assessment Cervical / Trunk Assessment: Normal  Communication   Communication: No difficulties  Cognition Arousal/Alertness:  Awake/alert Behavior During Therapy: WFL for tasks assessed/performed Overall Cognitive Status: Within Functional Limits for tasks assessed                                          General Comments      Exercises     Assessment/Plan    PT Assessment Patient does not need any further PT services  PT Problem List         PT Treatment Interventions      PT Goals (Current goals can be found in the Care Plan section)  Acute Rehab PT Goals Patient Stated Goal: less pain. regain PLOF PT Goal Formulation: With patient Time For Goal Achievement:  04/04/22 Potential to Achieve Goals: Good    Frequency       Co-evaluation               AM-PAC PT "6 Clicks" Mobility  Outcome Measure Help needed turning from your back to your side while in a flat bed without using bedrails?: None Help needed moving from lying on your back to sitting on the side of a flat bed without using bedrails?: None Help needed moving to and from a bed to a chair (including a wheelchair)?: None Help needed standing up from a chair using your arms (e.g., wheelchair or bedside chair)?: None Help needed to walk in hospital room?: None Help needed climbing 3-5 steps with a railing? : None 6 Click Score: 24    End of Session   Activity Tolerance: Patient tolerated treatment well Patient left: in chair;with call bell/phone within reach        Time: 3646-8032 PT Time Calculation (min) (ACUTE ONLY): 10 min   Charges:   PT Evaluation $PT Eval Low Complexity: 1 Low             Faye Ramsay, PT Acute Rehabilitation  Office: [phone]

## 2022-03-21 NOTE — Progress Notes (Signed)
Mobility Specialist - Progress Note   03/21/22 1141  Mobility  Activity Ambulated with assistance in hallway  Level of Assistance Modified independent, requires aide device or extra time  Assistive Device  (IV Pole)  Distance Ambulated (ft) 460 ft  Activity Response Tolerated well  Mobility Referral Yes  $Mobility charge 1 Mobility   Pt received in bed and agreeable to mobility. No complaints during mobility session. Pt to bed after session with all needs met.    [facility]

## 2022-03-21 NOTE — Evaluation (Signed)
Occupational Therapy Evaluation Patient Details Name: [name] MRN: [id number] DOB: 01/19/1966 Today's Date: 03/21/2022   History of Present Illness Patient is a 56 year old female who presented with incisional hernia from previous left ostomy site with nausea and pain associated with bulges. Patient underwent robotic incisional hernia repair with mesh on 12/18. ACZ:[id number], RA, HTN,diverticulitis, back pain.   Clinical Impression   Patient evaluated by Occupational Therapy with no further acute OT needs identified. All education has been completed and the patient has no further questions. Patient is able to complete ADLs with compensatory strategies with MI.  See below for any follow-up Occupational Therapy or equipment needs. OT is signing off. Thank you for this referral.       Recommendations for follow up therapy are one component of a multi-disciplinary discharge planning process, led by the attending physician.  Recommendations may be updated based on patient status, additional functional criteria and insurance authorization.   Follow Up Recommendations  No OT follow up     Assistance Recommended at Discharge PRN  Patient can return home with the following Assistance with cooking/housework    Functional Status Assessment  Patient has not had a recent decline in their functional status  Equipment Recommendations  Other (comment) (toileting buddy)    Recommendations for Other Services       Precautions / Restrictions Precautions Precautions: Other (comment) Precaution Comments: abd surgery Restrictions Weight Bearing Restrictions: No      Mobility Bed Mobility Overal bed mobility: Modified Independent                  Transfers Overall transfer level: Modified independent                        Balance Overall balance assessment: No apparent balance deficits (not formally assessed)                                          ADL either performed or assessed with clinical judgement   ADL Overall ADL's : Modified independent                                       General ADL Comments: patient is MI for ADLs with patient able to complete transfers, and bed mobility MI. patient was educated on toileting buddy for toileting hygiene instead of using towel and sliding between front and back. patient was educated on concerns over risks of UTIs with that method. patient verbalized undetstanding. patient reported having reacher at home already. patient was educated on how to use reacher for LB dressing tasks. patient verbalized understanding. patient endorsed being at baseline at this time. patient was educated on precautions after abdominal surgery and importance of following them. patient verbalized understanding.     Vision         Perception     Praxis      Pertinent Vitals/Pain Pain Assessment Pain Assessment: Faces Faces Pain Scale: No hurt     Hand Dominance Right   Extremity/Trunk Assessment Upper Extremity Assessment Upper Extremity Assessment: Overall WFL for tasks assessed   Lower Extremity Assessment Lower Extremity Assessment: Defer to PT evaluation   Cervical / Trunk Assessment Cervical / Trunk Assessment: Normal   Communication Communication Communication: No difficulties   Cognition  Arousal/Alertness: Awake/alert Behavior During Therapy: WFL for tasks assessed/performed Overall Cognitive Status: Within Functional Limits for tasks assessed                                       General Comments       Exercises     Shoulder Instructions      Home Living Family/patient expects to be discharged to:: Private residence Living Arrangements: Other relatives Available Help at Discharge: Family;Available 24 hours/day Type of Home: House Home Access: Level entry     Home Layout: Able to live on main level with bedroom/bathroom;Two level                Home Equipment: None          Prior Functioning/Environment Prior Level of Function : Independent/Modified Independent                        OT Problem List:        OT Treatment/Interventions:      OT Goals(Current goals can be found in the care plan section) Acute Rehab OT Goals OT Goal Formulation: All assessment and education complete, DC therapy  OT Frequency:      Co-evaluation              AM-PAC OT "6 Clicks" Daily Activity     Outcome Measure Help from another person eating meals?: None Help from another person taking care of personal grooming?: None Help from another person toileting, which includes using toliet, bedpan, or urinal?: None Help from another person bathing (including washing, rinsing, drying)?: None Help from another person to put on and taking off regular upper body clothing?: None Help from another person to put on and taking off regular lower body clothing?: None 6 Click Score: 24   End of Session Equipment Utilized During Treatment: Other (comment) (reacher and toileting tongs)  Activity Tolerance: Patient tolerated treatment well Patient left: in bed;with call bell/phone within reach;with family/visitor present  OT Visit Diagnosis: Pain                Time: 8119-1478 OT Time Calculation (min): 15 min Charges:  OT General Charges $OT Visit: 1 Visit OT Evaluation $OT Eval Low Complexity: 1 Low  Fontaine Hehl OTR/L, MS Acute Rehabilitation Department Office# [phone]   Selinda Flavin 03/21/2022, 3:13 PM

## 2022-03-21 NOTE — Progress Notes (Signed)
Mobility Specialist - Progress Note   03/21/22 1356  Mobility  Activity Ambulated with assistance in hallway  Level of Assistance Independent after set-up  Assistive Device  (IV Pole)  Distance Ambulated (ft) 460 ft  Activity Response Tolerated well  Mobility Referral Yes  $Mobility charge 1 Mobility   Pt received in bed and agreeable to mobility. No complaints during mobility. Pt to bed after session with all needs met & family in room.   [facility]

## 2022-03-21 NOTE — TOC CM/SW Note (Signed)
Transition of Care [facility] Screening Note  Patient Details  Name: [name] Date of Birth: 09/07/1965  Transition of [facility]) CM/SW Contact:    Ewing Schlein, LCSW Phone Number: 03/21/2022, 11:26 AM  Transition of Care Department [facility]) has reviewed patient and no TOC needs have been identified at this time. We will continue to monitor patient advancement through interdisciplinary progression rounds. If new patient transition needs arise, please place a TOC consult.

## 2022-03-21 NOTE — Progress Notes (Signed)
Progress Note: General Surgery Service   Chief Complaint/Subjective: Pain not well controlled.  Ambulating.  Tolerating liquids.  Lots of urine output with foley removed.  Objective: Vital signs in last 24 hours: Temp:  [97.5 F (36.4 C)-98.1 F (36.7 C)] 97.5 F (36.4 C) (12/19 0820) Pulse Rate:  [87-106] 87 (12/19 0820) Resp:  [15-20] 18 (12/19 0820) BP: (103-166)/(59-92) 103/59 (12/19 0820) SpO2:  [94 %-100 %] 98 % (12/19 0820) Weight:  [137.4 kg] 137.4 kg (12/18 1133) Last BM Date : 03/18/22  Intake/Output from previous day: 12/18 0701 - 12/19 0700 In: 2352.5 [P.O.:420; I.V.:1777.5; IV Piggyback:155] Out: 2700 [Urine:2650; Blood:50] Intake/Output this shift: No intake/output data recorded.  GI: Abd incisions c/d/I w/ glue  Lab Results: CBC  Recent Labs    03/21/22 0603  WBC 14.1*  HGB 12.1  HCT 35.2*  PLT 221   BMET Recent Labs    03/21/22 0603  NA 137  K 4.7  CL 104  CO2 24  GLUCOSE 140*  BUN 12  CREATININE 0.67  CALCIUM 8.8*   PT/INR No results for input(s): "LABPROT", "INR" in the last 72 hours. ABG No results for input(s): "PHART", "HCO3" in the last 72 hours.  Invalid input(s): "PCO2", "PO2"  Anti-infectives: Anti-infectives (From admission, onward)    Start     Dose/Rate Route Frequency Ordered Stop   03/20/22 1130  ceFAZolin (ANCEF) IVPB 3g/100 mL premix        3 g 200 mL/hr over 30 Minutes Intravenous On call to O.R. 03/20/22 1126 03/20/22 1330       Medications: Scheduled Meds:  enoxaparin (LOVENOX) injection  40 mg Subcutaneous Q24H   gabapentin  300 mg Oral TID   losartan  50 mg Oral Daily   And   hydrochlorothiazide  12.5 mg Oral Daily   polyethylene glycol  17 g Oral Daily   Continuous Infusions:  methocarbamol (ROBAXIN) IV 500 mg (03/21/22 0217)   PRN Meds:.ALPRAZolam, HYDROcodone-acetaminophen, HYDROmorphone (DILAUDID) injection, methocarbamol (ROBAXIN) IV, ondansetron **OR** ondansetron (ZOFRAN) IV, prochlorperazine  **OR** prochlorperazine, promethazine, simethicone  Assessment/Plan: s/p Procedure(s): ROBOTIC INCISIONAL HERNIA REPAIR WITH MESH 03/20/2022  Doing well POD1 Pain control is an issue, will need to stay till more comfortable on PO pain meds Regular diet Ambulate   LOS: 0 days     Quentin Ore, MD  [facility] Use [email or web address] to contact on call provider  Daily Billing: [id number] - post op

## 2022-03-22 DIAGNOSIS — K432 Incisional hernia without obstruction or gangrene: Secondary | ICD-10-CM | POA: Diagnosis not present

## 2022-03-22 LAB — CBC
HCT: 34.1 % — ABNORMAL LOW (ref 36.0–46.0)
Hemoglobin: 11.3 g/dL — ABNORMAL LOW (ref 12.0–15.0)
MCH: 26 pg (ref 26.0–34.0)
MCHC: 33.1 g/dL (ref 30.0–36.0)
MCV: 78.4 fL — ABNORMAL LOW (ref 80.0–100.0)
Platelets: 206 10*3/uL (ref 150–400)
RBC: 4.35 MIL/uL (ref 3.87–5.11)
RDW: 15.6 % — ABNORMAL HIGH (ref 11.5–15.5)
WBC: 10.1 10*3/uL (ref 4.0–10.5)
nRBC: 0 % (ref 0.0–0.2)

## 2022-03-22 LAB — BASIC METABOLIC PANEL
Anion gap: 7 (ref 5–15)
BUN: 12 mg/dL (ref 6–20)
CO2: 28 mmol/L (ref 22–32)
Calcium: 8.1 mg/dL — ABNORMAL LOW (ref 8.9–10.3)
Chloride: 105 mmol/L (ref 98–111)
Creatinine, Ser: 0.6 mg/dL (ref 0.44–1.00)
GFR, Estimated: >60 mL/min (ref >60)
Glucose, Bld: 97 mg/dL (ref 70–99)
Potassium: 3.5 mmol/L (ref 3.5–5.1)
Sodium: 140 mmol/L (ref 135–145)

## 2022-03-22 NOTE — Progress Notes (Signed)
Progress Note: General Surgery Service   Chief Complaint/Subjective: Doing a little better on norco.  But still with pain.  No bowel movement yet.  No family at home.  Objective: Vital signs in last 24 hours: Temp:  [97.9 F (36.6 C)-98.2 F (36.8 C)] 98.2 F (36.8 C) (12/20 0516) Pulse Rate:  [88-96] 90 (12/20 0516) Resp:  [17-18] 18 (12/20 0516) BP: (101-118)/(53-71) 101/53 (12/20 0516) SpO2:  [92 %-99 %] 97 % (12/20 0516) Last BM Date : 03/18/22  Intake/Output from previous day: 12/19 0701 - 12/20 0700 In: 1140 [P.O.:1140] Out: 2700 [Urine:2700] Intake/Output this shift: Total I/O In: 240 [P.O.:240] Out: -   GI: Abd incisions c/d/I w/ glue  Lab Results: CBC  Recent Labs    03/21/22 0603 03/22/22 0537  WBC 14.1* 10.1  HGB 12.1 11.3*  HCT 35.2* 34.1*  PLT 221 206    BMET Recent Labs    03/21/22 0603 03/22/22 0537  NA 137 140  K 4.7 3.5  CL 104 105  CO2 24 28  GLUCOSE 140* 97  BUN 12 12  CREATININE 0.67 0.60  CALCIUM 8.8* 8.1*    PT/INR No results for input(s): "LABPROT", "INR" in the last 72 hours. ABG No results for input(s): "PHART", "HCO3" in the last 72 hours.  Invalid input(s): "PCO2", "PO2"  Anti-infectives: Anti-infectives (From admission, onward)    Start     Dose/Rate Route Frequency Ordered Stop   03/20/22 1130  ceFAZolin (ANCEF) IVPB 3g/100 mL premix        3 g 200 mL/hr over 30 Minutes Intravenous On call to O.R. 03/20/22 1126 03/20/22 1330       Medications: Scheduled Meds:  enoxaparin (LOVENOX) injection  40 mg Subcutaneous Q24H   gabapentin  300 mg Oral TID   losartan  50 mg Oral Daily   And   hydrochlorothiazide  12.5 mg Oral Daily   polyethylene glycol  17 g Oral Daily   Continuous Infusions:  methocarbamol (ROBAXIN) IV 500 mg (03/21/22 0217)   PRN Meds:.ALPRAZolam, HYDROcodone-acetaminophen, HYDROmorphone (DILAUDID) injection, methocarbamol (ROBAXIN) IV, ondansetron **OR** ondansetron (ZOFRAN) IV, prochlorperazine  **OR** prochlorperazine, promethazine, simethicone  Assessment/Plan: s/p Procedure(s): ROBOTIC INCISIONAL HERNIA REPAIR WITH MESH 03/20/2022  Doing well POD2 Pain control is an issue, also would like to have a BM prior to discharge Regular diet Ambulate   LOS: 1 day     Quentin Ore, MD  [facility] Use [email or web address] to contact on call provider  Daily Billing: [id number] - post op

## 2022-03-23 DIAGNOSIS — K432 Incisional hernia without obstruction or gangrene: Secondary | ICD-10-CM | POA: Diagnosis not present

## 2022-03-23 LAB — BASIC METABOLIC PANEL
Anion gap: 7 (ref 5–15)
BUN: 9 mg/dL (ref 6–20)
CO2: 27 mmol/L (ref 22–32)
Calcium: 8.1 mg/dL — ABNORMAL LOW (ref 8.9–10.3)
Chloride: 103 mmol/L (ref 98–111)
Creatinine, Ser: 0.68 mg/dL (ref 0.44–1.00)
GFR, Estimated: >60 mL/min (ref >60)
Glucose, Bld: 118 mg/dL — ABNORMAL HIGH (ref 70–99)
Potassium: 3.4 mmol/L — ABNORMAL LOW (ref 3.5–5.1)
Sodium: 137 mmol/L (ref 135–145)

## 2022-03-23 LAB — CBC
HCT: 33.5 % — ABNORMAL LOW (ref 36.0–46.0)
Hemoglobin: 11 g/dL — ABNORMAL LOW (ref 12.0–15.0)
MCH: 25.8 pg — ABNORMAL LOW (ref 26.0–34.0)
MCHC: 32.8 g/dL (ref 30.0–36.0)
MCV: 78.6 fL — ABNORMAL LOW (ref 80.0–100.0)
Platelets: 229 10*3/uL (ref 150–400)
RBC: 4.26 MIL/uL (ref 3.87–5.11)
RDW: 15.6 % — ABNORMAL HIGH (ref 11.5–15.5)
WBC: 9.2 10*3/uL (ref 4.0–10.5)
nRBC: 0 % (ref 0.0–0.2)

## 2022-03-23 MED ORDER — HYDROCODONE-ACETAMINOPHEN 5-325 MG PO TABS: 1.0000 | ORAL_TABLET | Freq: Four times a day (QID) | ORAL | 0 refills | Status: AC | PRN

## 2022-03-23 NOTE — Progress Notes (Signed)
Discharge instructions discussed with patient, verbalized agreement and understanding, walker delivered to patient for discharge home

## 2022-03-23 NOTE — Discharge Instructions (Signed)
 VENTRAL HERNIA REPAIR POST OPERATIVE INSTRUCTIONS  Thinking Clearly  The anesthesia may cause you to feel different for 1 or 2 days. Do not drive, drink alcohol, or make any big decisions for at least 2 days.  Nutrition When you wake up, you will be able to drink small amounts of liquid. If you do not feel sick, you can slowly advance your diet to regular foods. Continue to drink lots of fluids, usually about 8 to 10 glasses per day. Eat a high-fiber diet so you don't strain during bowel movements. High-Fiber Foods Foods high in fiber include beans, bran cereals and whole-grain breads, peas, dried fruit (figs, apricots, and dates), raspberries, blackberries, strawberries, sweet corn, broccoli, baked potatoes with skin, plums, pears, apples, greens, and nuts. Activity Slowly increase your activity. Be sure to get up and walk every hour or so to prevent blood clots. No heavy lifting or strenuous activity for 4 weeks following surgery to prevent hernias at your incision sites or recurrence of your hernia. It is normal to feel tired. You may need more sleep than usual.  Get your rest but make sure to get up and move around frequently to prevent blood clots and pneumonia.  Work and Return to School You can go back to work when you feel well enough. Discuss the timing with your surgeon. You can usually go back to school or work 1 week or less after an laparoscopic or an open repair. If your work requires heavy lifting or strenuous activity you need to be placed on light duty for 4 weeks following surgery. You can return to gym class, sports or other physical activities 4 weeks after surgery.  Wound Care You may experience significant bruising throughout the abdominal wall that may track down into the groin including into the scrotum in males.  Rest, elevating the groin and scrotum above the level of the heart, ice and compression with tight fitting underwear or an abdominal binder can help.   Always wash your hands before and after touching near your incision site. Do not soak in a bathtub until cleared at your follow up appointment. You may take a shower 24 hours after surgery. A small amount of drainage from the incision is normal. If the drainage is thick and yellow or the site is red, you may have an infection, so call your surgeon. If you have a drain in one of your incisions, it will be taken out in office when the drainage stops. Steri-Strips will fall off in 7 to 10 days or they will be removed during your first office visit. If you have dermabond glue covering over the incision, allow the glue to flake off on its own. Protect the new skin, especially from the sun. The sun can burn and cause darker scarring. Your scar will heal in about 4 to 6 weeks and will become softer and continue to fade over the next year.  The cosmetic appearance of the incisions will improve over the course of the first year after surgery. Sensation around your incision will return in a few weeks or months.  Bowel Movements After intestinal surgery, you may have loose watery stools for several days. If watery diarrhea lasts longer than 3 days, contact your surgeon. Pain medication (narcotics) can cause constipation. Increase the fiber in your diet with high-fiber foods if you are constipated. You can take an over the counter stool softener like Colace to avoid constipation.  Additional over the counter medications can also be used   if Colace isn't sufficient (for example, Milk of Magnesia or Miralax).  Pain The amount of pain is different for each person. Some people need only 1 to 3 doses of pain control medication, while others need more. Take alternating doses of tylenol and ibuprofen around the clock for the first five days following surgery.  This will provide a baseline of pain control and help with inflammation.  Take the narcotic pain medication in addition if needed for severe pain.  Contact  Your Surgeon at [phone], if you have: Pain that will not go away Pain that gets worse A fever of more than 101F (38.3C) Repeated vomiting Swelling, redness, bleeding, or bad-smelling drainage from your wound site Strong abdominal pain No bowel movement or unable to pass gas for 3 days Watery diarrhea lasting longer than 3 days  Pain Control The goal of pain control is to minimize pain, keep you moving and help you heal. Your surgical team will work with you on your pain plan. Most often a combination of therapies and medications are used to control your pain. You may also be given medication (local anesthetic) at the surgical site. This may help control your pain for several days. Extreme pain puts extra stress on your body at a time when your body needs to focus on healing. Do not wait until your pain has reached a level "10" or is unbearable before telling your doctor or nurse. It is much easier to control pain before it becomes severe. Following a laparoscopic procedure, pain is sometimes felt in the shoulder. This is due to the gas inserted into your abdomen during the procedure. Moving and walking helps to decrease the gas and the right shoulder pain.  Use the guide below for ways to manage your post-operative pain. Learn more by going to facs.org/safepaincontrol.  How Intense Is My Pain Common Therapies to Feel Better       I hardly notice my pain, and it does not interfere with my activities.  I notice my pain and it distracts me, but I can still do activities (sitting up, walking, standing).  Non-Medication Therapies  Ice (in a bag, applied over clothing at the surgical site), elevation, rest, meditation, massage, distraction (music, TV, play) walking and mild exercise Splinting the abdomen with pillows +  Non-Opioid Medications Acetaminophen (Tylenol) Non-steroidal anti-inflammatory drugs (NSAIDS) Aspirin, Ibuprofen (Motrin, Advil) Naproxen (Aleve) Take these as  needed, when you feel pain. Both acetaminophen and NSAIDs help to decrease pain and swelling (inflammation).      My pain is hard to ignore and is more noticeable even when I rest.  My pain interferes with my usual activities.  Non-Medication Therapies  +  Non-Opioid medications  Take on a regular schedule (around-the-clock) instead of as needed. (For example, Tylenol every 6 hours at 9:00 am, 3:00 pm, 9:00 pm, 3:00 am and Motrin every 6 hours at 12:00 am, 6:00 am, 12:00 pm, 6:00 pm)         I am focused on my pain, and I am not doing my daily activities.  I am groaning in pain, and I cannot sleep. I am unable to do anything.  My pain is as bad as it could be, and nothing else matters.  Non-Medication Therapies  +  Around-the-Clock Non-Opioid Medications  +  Short-acting opioids  Opioids should be used with other medications to manage severe pain. Opioids block pain and give a feeling of euphoria (feel high). Addiction, a serious side effect of opioids, is   rare with short-term (a few days) use.  Examples of short-acting opioids include: Tramadol (Ultram), Hydrocodone (Norco, Vicodin), Hydromorphone (Dilaudid), Oxycodone (Oxycontin)     The above directions have been adapted from the American College of Surgeons Surgical Patient Education Program.  Please refer to the ACS website if needed: https://[email or web address].org/-/media/files/education/[email or web address]   Dalante Minus, MD [facility] [address], [address], , [place]  [address] ?  [address] [address], , [place]   [address] [phone] ? [phone] ? FAX [phone] Web site: [email or web address]  

## 2022-03-23 NOTE — Discharge Summary (Signed)
Patient ID: [name] [id number] y.o. 1966/01/08  03/20/2022  Discharge date and time: 03/23/2022  Admitting Physician: Hyman Hopes Tiron Suski  Discharge Physician: Hyman Hopes Cecylia Brazill  Admission Diagnoses: Incisional hernia [K43.2] Patient Active Problem List   Diagnosis Date Noted   Incisional hernia 03/20/2022   Bilateral primary osteoarthritis of knee 10/05/2021   Body mass index 45.0-49.9, adult (HCC) 10/05/2021   Morbid obesity (HCC) 10/05/2021   Essential hypertension 09/29/2021   Other constipation 09/29/2021   GAD (generalized anxiety disorder) 09/29/2021   Refusal of blood transfusions as patient is Jehovah's Witness 09/08/2020   Diverticulitis of sigmoid colon 09/08/2020   Body mass index (BMI) 40.0-44.9, adult (HCC) 05/18/2020   Acquired thrombophilia (HCC) 05/18/2020   Colostomy present (HCC) 05/18/2020   Chronic pain 05/18/2020   Diverticular disease of colon 05/18/2020   Esophageal reflux 05/18/2020   Panic disorder 05/18/2020   Mixed anxiety and depressive disorder 05/18/2020   Irritable bowel syndrome 05/18/2020   Mixed hyperlipidemia 05/18/2020   Presence of IVC filter 12/03/2019   Protein-calorie malnutrition, severe 11/27/2019   Hypertension 11/14/2019   Anemia, chronic disease 11/14/2019     Discharge Diagnoses:  Patient Active Problem List   Diagnosis Date Noted   Incisional hernia 03/20/2022   Bilateral primary osteoarthritis of knee 10/05/2021   Body mass index 45.0-49.9, adult (HCC) 10/05/2021   Morbid obesity (HCC) 10/05/2021   Essential hypertension 09/29/2021   Other constipation 09/29/2021   GAD (generalized anxiety disorder) 09/29/2021   Refusal of blood transfusions as patient is Jehovah's Witness 09/08/2020   Diverticulitis of sigmoid colon 09/08/2020   Body mass index (BMI) 40.0-44.9, adult (HCC) 05/18/2020   Acquired thrombophilia (HCC) 05/18/2020   Colostomy present (HCC) 05/18/2020   Chronic pain 05/18/2020    Diverticular disease of colon 05/18/2020   Esophageal reflux 05/18/2020   Panic disorder 05/18/2020   Mixed anxiety and depressive disorder 05/18/2020   Irritable bowel syndrome 05/18/2020   Mixed hyperlipidemia 05/18/2020   Presence of IVC filter 12/03/2019   Protein-calorie malnutrition, severe 11/27/2019   Hypertension 11/14/2019   Anemia, chronic disease 11/14/2019    Operations: Procedure(s): ROBOTIC INCISIONAL HERNIA REPAIR WITH MESH  Admission Condition: good  Discharged Condition: good  Indication for Admission: incisional hernia  Hospital Course: Robotic incisional hernia repair with mesh  Consults: None  Significant Diagnostic Studies: None  Treatments: surgery: as above  Disposition: Home  Patient Instructions:  Allergies as of 03/23/2022       Reactions   Other    Blood refusal   Aspirin Diarrhea   stomach ache.         Medication List     TAKE these medications    ALPRAZolam 0.5 MG tablet Commonly known as: XANAX Take 0.5 mg by mouth daily as needed for anxiety.   cholecalciferol 25 MCG (1000 UNIT) tablet Commonly known as: VITAMIN D3 Take 1,000 Units by mouth daily.   HYDROcodone-acetaminophen 5-325 MG tablet Commonly known as: Norco Take 1 tablet by mouth every 6 (six) hours as needed for moderate pain.   losartan-hydrochlorothiazide 50-12.5 MG tablet Commonly known as: HYZAAR Take 1 tablet by mouth daily.   Omega 3 1000 MG Caps Take 1,000 mg by mouth daily.   polyethylene glycol 17 g packet Commonly known as: MIRALAX / GLYCOLAX Take 17 g by mouth daily.   promethazine 25 MG tablet Commonly known as: PHENERGAN Take 25 mg by mouth every 12 (twelve) hours as needed for nausea or vomiting.  Durable Medical Equipment  (From admission, onward)           Start     Ordered   03/23/22 0000  For home use only DME 4 wheeled rolling walker with seat       Question:  Patient needs a walker to treat with the  following condition  Answer:  Incisional hernia   03/23/22 [id number]            Activity: no heavy lifting for 4 weeks Diet: regular diet Wound Care: keep wound clean and dry  Follow-up:  With Dr. Thermon Leyland.  Signed: Nickola Major Jadrien Narine General, Bariatric, & Minimally Invasive Surgery [facility] [place]   03/23/2022, 7:19 AM

## 2022-03-23 NOTE — TOC Transition Note (Signed)
Transition of Care [facility]) - CM/SW Discharge Note  Patient Details  Name: [name] MRN: [id number] Date of Birth: 1966/02/26  Transition of [facility]) CM/SW Contact:  Ewing Schlein, LCSW Phone Number: 03/23/2022, 11:14 AM  Clinical Narrative: TOC notified patient will need a rolling walker. Order has been placed. CSW made DME referral to [facility] with [employer]. [employer] to deliver walker to patient's room. CSW updated patient. TOC signing off.   Final next level of care: Home/Self Care Barriers to Discharge: No Barriers Identified  Patient Goals and CMS Choice CMS Medicare.gov Compare Post Acute Care list provided to:: Patient Choice offered to / list presented to : Patient  Discharge Plan and Services        DME Arranged: Walker rolling DME Agency: [employer] Date DME Agency Contacted: 03/23/22 Time DME Agency Contacted: 1027 Representative spoke with at DME Agency: Belenda Cruise  Social Determinants of Health (SDOH) Interventions    Readmission Risk Interventions    12/10/2019    2:23 PM  Readmission Risk Prevention Plan  Transportation Screening Complete  PCP or Specialist Appt within 3-5 Days Complete  HRI or Home Care Consult Complete  Social Work Consult for Recovery Care Planning/Counseling Complete  Palliative Care Screening Not Applicable  Medication Review [facility]) Referral to Pharmacy

## 2022-04-18 ENCOUNTER — Ambulatory Visit: Payer: Commercial Managed Care - PPO | Admitting: Orthopaedic Surgery

## 2022-04-18 ENCOUNTER — Encounter: Payer: Self-pay | Admitting: Orthopaedic Surgery

## 2022-04-18 DIAGNOSIS — M1711 Unilateral primary osteoarthritis, right knee: Secondary | ICD-10-CM

## 2022-04-18 DIAGNOSIS — M1712 Unilateral primary osteoarthritis, left knee: Secondary | ICD-10-CM

## 2022-04-18 DIAGNOSIS — M170 Bilateral primary osteoarthritis of knee: Secondary | ICD-10-CM

## 2022-04-18 MED ORDER — BUPIVACAINE HCL 0.5 % IJ SOLN
2.0000 mL | INTRAMUSCULAR | Status: AC | PRN
  Administered 2022-04-18: 2 mL via INTRA_ARTICULAR

## 2022-04-18 MED ORDER — METHYLPREDNISOLONE ACETATE 40 MG/ML IJ SUSP
40.0000 mg | INTRAMUSCULAR | Status: AC | PRN
  Administered 2022-04-18: 40 mg via INTRA_ARTICULAR

## 2022-04-18 MED ORDER — LIDOCAINE HCL 1 % IJ SOLN
2.0000 mL | INTRAMUSCULAR | Status: AC | PRN
  Administered 2022-04-18: 2 mL

## 2022-04-18 NOTE — Progress Notes (Signed)
Office Visit Note   Patient: [name]           Date of Birth: 12-29-1965           MRN: [id number] Visit Date: 04/18/2022              Requested by: [name], [place],   [id number] PCP: Gabrielle Loader, FNP   Assessment & Plan: Visit Diagnoses:  1. Bilateral primary osteoarthritis of knee     Plan: Impression bilateral knee osteoarthritis.  Steroid injections performed both knees today.  Follow-up as needed.  Follow-Up Instructions: Return if symptoms worsen or fail to improve.   Orders:  No orders of the defined types were placed in this encounter.  No orders of the defined types were placed in this encounter.     Procedures: Large Joint Inj: bilateral knee on 04/18/2022 8:32 AM Indications: pain Details: 22 G needle  Arthrogram: No  Medications (Right): 2 mL lidocaine 1 %; 2 mL bupivacaine 0.5 %; 40 mg methylPREDNISolone acetate 40 MG/ML Medications (Left): 2 mL lidocaine 1 %; 2 mL bupivacaine 0.5 %; 40 mg methylPREDNISolone acetate 40 MG/ML Outcome: tolerated well, no immediate complications Patient was prepped and draped in the usual sterile fashion.       Clinical Data: No additional findings.   Subjective: Chief Complaint  Patient presents with   Left Knee - Pain   Right Knee - Pain    HPI [name] returns today for bilateral knee OA.  Requesting cortisone injections. Review of Systems   Objective: Vital Signs: There were no vitals taken for this visit.  Physical Exam  Ortho Exam Examination of bilateral knees show no effusion. Specialty Comments:  No specialty comments available.  Imaging: No results found.   PMFS History: Patient Active Problem List   Diagnosis Date Noted   Incisional hernia 03/20/2022   Bilateral primary osteoarthritis of knee 10/05/2021   Body mass index 45.0-49.9, adult ([place]) 10/05/2021   Morbid obesity ([place]) 10/05/2021   Essential hypertension 09/29/2021   Other  constipation 09/29/2021   GAD (generalized anxiety disorder) 09/29/2021   Refusal of blood transfusions as patient is Jehovah's Witness 09/08/2020   Diverticulitis of sigmoid colon 09/08/2020   Body mass index (BMI) 40.0-44.9, adult ([place]) 05/18/2020   Acquired thrombophilia ([place]) 05/18/2020   Colostomy present ([place]) 05/18/2020   Chronic pain 05/18/2020   Diverticular disease of colon 05/18/2020   Esophageal reflux 05/18/2020   Panic disorder 05/18/2020   Mixed anxiety and depressive disorder 05/18/2020   Irritable bowel syndrome 05/18/2020   Mixed hyperlipidemia 05/18/2020   Presence of IVC filter 12/03/2019   Protein-calorie malnutrition, severe 11/27/2019   Hypertension 11/14/2019   Anemia, chronic disease 11/14/2019   Past Medical History:  Diagnosis Date   Anemia    Anxiety    Arthritis    Back pain    Constipation    Diverticulitis    Diverticulitis of large intestine with abscess 11/14/2019   DVT (deep venous thrombosis) (HCC)    Hypertension    IBS (irritable bowel syndrome)    Intra-abdominal abscess ([place]) 11/02/2019   Joint pain    Osteoarthritis    PONV (postoperative nausea and vomiting)    Pre-diabetes    Refusal of blood product    Rheumatoid arthritis (HCC)    Severe sepsis ([place]) 11/06/2019   Stomach ulcer    Vitamin D deficiency     Family History  Problem Relation Age of Onset  Hypertension Mother    Diabetes Mother    High Cholesterol Mother    Cancer Father    Diabetes Father    High blood pressure Father    Alcoholism Father     Past Surgical History:  Procedure Laterality Date   ABDOMINAL HYSTERECTOMY     BOWEL RESECTION N/A 11/25/2019   Procedure: SMALL BOWEL RESECTION;  Surgeon: Stark Klein, MD;  Location: [facility];  Service: General;  Laterality: N/A;   COLECTOMY N/A 11/25/2019   Procedure: SIGMOID COLECTOMY;  Surgeon: Stark Klein, MD;  Location: [facility];  Service: General;  Laterality: N/A;   COLOSTOMY N/A 11/25/2019   Procedure:  CREATION OF COLOSTOMY;  Surgeon: Stark Klein, MD;  Location: [facility];  Service: General;  Laterality: N/A;   INCISION AND DRAINAGE ABSCESS N/A 11/25/2019   Procedure: INCISION AND DRAINAGE ABDOMINAL ABSCESS;  Surgeon: Stark Klein, MD;  Location: [facility];  Service: General;  Laterality: N/A;   IR IVC FILTER PLMT / S&I /IMG GUID/MOD SED  12/03/2019   IVC FILTER REMOVAL N/A 10/26/2020   Procedure: IVC FILTER REMOVAL;  Surgeon: Serafina Mitchell, MD;  Location: [facility];  Service: Cardiovascular;  Laterality: N/A;   LAPAROTOMY N/A 11/25/2019   Procedure: EXPLORATORY LAPAROTOMY;  Surgeon: Stark Klein, MD;  Location: [facility];  Service: General;  Laterality: N/A;   LYSIS OF ADHESION N/A 09/08/2020   Procedure: LYSIS OF ADHESION;  Surgeon: Michael Boston, MD;  Location: [facility];  Service: General;  Laterality: N/A;   PROCTOSCOPY N/A 09/08/2020   Procedure: RIGID PROCTOSCOPY;  Surgeon: Michael Boston, MD;  Location: [facility];  Service: General;  Laterality: N/A;   XI ROBOTIC ASSISTED COLOSTOMY TAKEDOWN N/A 09/08/2020   Procedure: XI ROBOTIC ASSISTED OSTOMY TAKEDOWN;TAP BLOCKS, PRIMARY INCISIONAL PERITONEAL HERNIA REPAIR;  Surgeon: Michael Boston, MD;  Location: [facility];  Service: General;  Laterality: N/A;   XI ROBOTIC ASSISTED VENTRAL HERNIA N/A 03/20/2022   Procedure: ROBOTIC [place];  Surgeon: Felicie Morn, MD;  Location: [facility];  Service: General;  Laterality: N/A;  Bilateral posterior rectus myofascial release, bilateral transversus abdominis myofascial release   Social History   Occupational History    Employer: [employer]   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

## 2022-05-03 ENCOUNTER — Other Ambulatory Visit: Payer: Self-pay | Admitting: Surgery

## 2022-05-03 DIAGNOSIS — R1084 Generalized abdominal pain: Secondary | ICD-10-CM

## 2022-05-03 DIAGNOSIS — Z9889 Other specified postprocedural states: Secondary | ICD-10-CM

## 2022-06-09 ENCOUNTER — Other Ambulatory Visit: Payer: Commercial Managed Care - PPO

## 2022-06-19 ENCOUNTER — Other Ambulatory Visit: Payer: Commercial Managed Care - PPO

## 2022-06-19 ENCOUNTER — Ambulatory Visit
Admission: RE | Admit: 2022-06-19 | Discharge: 2022-06-19 | Disposition: A | Discharge status: Home / Self Care | Payer: Commercial Managed Care - PPO | Source: Ambulatory Visit | Attending: Surgery | Admitting: Surgery

## 2022-06-19 DIAGNOSIS — R1084 Generalized abdominal pain: Secondary | ICD-10-CM

## 2022-06-19 DIAGNOSIS — Z9889 Other specified postprocedural states: Secondary | ICD-10-CM

## 2022-06-19 MED ORDER — IOPAMIDOL (ISOVUE-300) INJECTION 61%
100.0000 mL | Freq: Once | INTRAVENOUS | Status: AC | PRN
  Administered 2022-06-19: 100 mL via INTRAVENOUS

## 2022-06-20 NOTE — Progress Notes (Signed)
Discussed with Ms. Lea.  Abdominal wall hernia repair intact.  Small fluid collection in subcutaneous space where hernia was reduced.  Gabapentin helping with pain but makes her drowsy - will try adjusting dosing

## 2022-07-05 ENCOUNTER — Other Ambulatory Visit: Payer: Commercial Managed Care - PPO

## 2022-07-10 ENCOUNTER — Telehealth: Payer: Self-pay | Admitting: Physician Assistant

## 2022-07-10 NOTE — Telephone Encounter (Signed)
Patient stated she would like a new Rx for the pain and swelling in her legs she was prescribed diclofenac in 09/2011 and stated it hurts her stomach but she would like something to help her she has an appt 07/20/22 with Roda Shutters please advise stated she only wanted to see the Dr, I offered her an appt tomorrow with Lillia Abed

## 2022-07-10 NOTE — Telephone Encounter (Signed)
Tried to call patient. No answer. LMOM for patient to call me back regarding message from Dr.Xu.

## 2022-07-10 NOTE — Telephone Encounter (Signed)
I would suggest that she take a OTC pepcid tablet with each diclofenac to see if that will help.

## 2022-07-12 MED ORDER — DICLOFENAC SODIUM 75 MG PO TBEC: 75.0000 mg | DELAYED_RELEASE_TABLET | Freq: Two times a day (BID) | ORAL | 2 refills | Status: DC

## 2022-07-12 NOTE — Addendum Note (Signed)
Addended by: Mayra Reel on: 07/12/2022 04:51 PM   Modules accepted: Orders

## 2022-07-12 NOTE — Telephone Encounter (Signed)
done

## 2022-07-12 NOTE — Telephone Encounter (Signed)
I was able to reach patient. She will get the pepcid OTC. She is requesting a refill of the diclofenac be sent to [facility] on [address].

## 2022-07-20 ENCOUNTER — Ambulatory Visit: Payer: Commercial Managed Care - PPO | Admitting: Orthopaedic Surgery

## 2022-07-23 NOTE — Progress Notes (Deleted)
Office Visit Note   Patient: [name]           Date of Birth: 04-30-1965           MRN: [id number] Visit Date: 07/25/2022              Requested by: Soundra Pilon, FNP [address] [place],  [place] [address] PCP: Soundra Pilon, FNP   Assessment & Plan: Visit Diagnoses:  1. Bilateral primary osteoarthritis of knee     Plan: ***  Follow-Up Instructions: No follow-ups on file.   Orders:  No orders of the defined types were placed in this encounter.  No orders of the defined types were placed in this encounter.     Procedures: No procedures performed   Clinical Data: No additional findings.   Subjective: No chief complaint on file.   HPI  Review of Systems   Objective: Vital Signs: There were no vitals taken for this visit.  Physical Exam  Ortho Exam  Specialty Comments:  No specialty comments available.  Imaging: No results found.   PMFS History: Patient Active Problem List   Diagnosis Date Noted   Incisional hernia 03/20/2022   Bilateral primary osteoarthritis of knee 10/05/2021   Body mass index 45.0-49.9, adult 10/05/2021   Morbid obesity 10/05/2021   Essential hypertension 09/29/2021   Other constipation 09/29/2021   GAD (generalized anxiety disorder) 09/29/2021   Refusal of blood transfusions as patient is Jehovah's Witness 09/08/2020   Diverticulitis of sigmoid colon 09/08/2020   Body mass index (BMI) 40.0-44.9, adult 05/18/2020   Acquired thrombophilia 05/18/2020   Colostomy present 05/18/2020   Chronic pain 05/18/2020   Diverticular disease of colon 05/18/2020   Esophageal reflux 05/18/2020   Panic disorder 05/18/2020   Mixed anxiety and depressive disorder 05/18/2020   Irritable bowel syndrome 05/18/2020   Mixed hyperlipidemia 05/18/2020   Presence of IVC filter 12/03/2019   Protein-calorie malnutrition, severe 11/27/2019   Hypertension 11/14/2019   Anemia, chronic disease 11/14/2019   Past Medical History:   Diagnosis Date   Anemia    Anxiety    Arthritis    Back pain    Constipation    Diverticulitis    Diverticulitis of large intestine with abscess 11/14/2019   DVT (deep venous thrombosis) (HCC)    Hypertension    IBS (irritable bowel syndrome)    Intra-abdominal abscess (HCC) 11/02/2019   Joint pain    Osteoarthritis    PONV (postoperative nausea and vomiting)    Pre-diabetes    Refusal of blood product    Rheumatoid arthritis (HCC)    Severe sepsis (HCC) 11/06/2019   Stomach ulcer    Vitamin D deficiency     Family History  Problem Relation Age of Onset   Hypertension Mother    Diabetes Mother    High Cholesterol Mother    Cancer Father    Diabetes Father    High blood pressure Father    Alcoholism Father     Past Surgical History:  Procedure Laterality Date   ABDOMINAL HYSTERECTOMY     BOWEL RESECTION N/A 11/25/2019   Procedure: SMALL BOWEL RESECTION;  Surgeon: Almond Lint, MD;  [facility];  Service: General;  Laterality: N/A;   COLECTOMY N/A 11/25/2019   Procedure: SIGMOID COLECTOMY;  Surgeon: Almond Lint, MD;  [facility];  Service: General;  Laterality: N/A;   COLOSTOMY N/A 11/25/2019   Procedure: CREATION OF COLOSTOMY;  Surgeon: Almond Lint, MD;  [facility];  Service: General;  Laterality: N/A;   INCISION AND DRAINAGE ABSCESS N/A 11/25/2019   Procedure: INCISION AND DRAINAGE ABDOMINAL ABSCESS;  Surgeon: Almond Lint, MD;  [facility];  Service: General;  Laterality: N/A;   IR IVC FILTER PLMT / S&I Lenise Arena GUID/MOD SED  12/03/2019   IVC FILTER REMOVAL N/A 10/26/2020   Procedure: IVC FILTER REMOVAL;  Surgeon: Nada Libman, MD;  [facility];  Service: Cardiovascular;  Laterality: N/A;   LAPAROTOMY N/A 11/25/2019   Procedure: EXPLORATORY LAPAROTOMY;  Surgeon: Almond Lint, MD;  [facility];  Service: General;  Laterality: N/A;   LYSIS OF ADHESION N/A 09/08/2020   Procedure: LYSIS OF ADHESION;  Surgeon: Karie Soda, MD;   [facility];  Service: General;  Laterality: N/A;   PROCTOSCOPY N/A 09/08/2020   Procedure: RIGID PROCTOSCOPY;  Surgeon: Karie Soda, MD;  [facility];  Service: General;  Laterality: N/A;   XI ROBOTIC ASSISTED COLOSTOMY TAKEDOWN N/A 09/08/2020   Procedure: XI ROBOTIC ASSISTED OSTOMY TAKEDOWN;TAP BLOCKS, PRIMARY INCISIONAL PERITONEAL HERNIA REPAIR;  Surgeon: Karie Soda, MD;  [facility];  Service: General;  Laterality: N/A;   XI ROBOTIC ASSISTED VENTRAL HERNIA N/A 03/20/2022   Procedure: ROBOTIC INCISIONAL HERNIA REPAIR WITH MESH;  Surgeon: Quentin Ore, MD;  [facility];  Service: General;  Laterality: N/A;  Bilateral posterior rectus myofascial release, bilateral transversus abdominis myofascial release   Social History   Occupational History    Employer: [employer]   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

## 2022-07-25 ENCOUNTER — Ambulatory Visit (INDEPENDENT_AMBULATORY_CARE_PROVIDER_SITE_OTHER): Payer: Commercial Managed Care - PPO | Admitting: Orthopaedic Surgery

## 2022-07-25 DIAGNOSIS — M170 Bilateral primary osteoarthritis of knee: Secondary | ICD-10-CM

## 2022-08-04 NOTE — Progress Notes (Signed)
Canceled appt.

## 2022-08-22 ENCOUNTER — Ambulatory Visit: Payer: Commercial Managed Care - PPO | Admitting: Orthopaedic Surgery

## 2022-08-22 DIAGNOSIS — M1712 Unilateral primary osteoarthritis, left knee: Secondary | ICD-10-CM

## 2022-08-22 DIAGNOSIS — M170 Bilateral primary osteoarthritis of knee: Secondary | ICD-10-CM | POA: Diagnosis not present

## 2022-08-22 DIAGNOSIS — M1711 Unilateral primary osteoarthritis, right knee: Secondary | ICD-10-CM

## 2022-08-22 MED ORDER — BUPIVACAINE HCL 0.5 % IJ SOLN
2.0000 mL | INTRAMUSCULAR | Status: AC | PRN
  Administered 2022-08-22: 2 mL via INTRA_ARTICULAR

## 2022-08-22 MED ORDER — METHYLPREDNISOLONE ACETATE 40 MG/ML IJ SUSP
40.0000 mg | INTRAMUSCULAR | Status: AC | PRN
Start: 2022-08-22 — End: 2022-08-22
  Administered 2022-08-22: 40 mg via INTRA_ARTICULAR

## 2022-08-22 MED ORDER — LIDOCAINE HCL 1 % IJ SOLN
2.0000 mL | INTRAMUSCULAR | Status: AC | PRN
Start: 2022-08-22 — End: 2022-08-22
  Administered 2022-08-22: 2 mL

## 2022-08-22 NOTE — Progress Notes (Signed)
Office Visit Note   Patient: [name]           Date of Birth: 1965/05/21           MRN: [id number] Visit Date: 08/22/2022              Requested by: Soundra Pilon, FNP [address] [place],  [place] [address] PCP: Soundra Pilon, FNP   Assessment & Plan: Visit Diagnoses:  1. Bilateral primary osteoarthritis of knee     Plan: Impression is 57 year old female with bilateral knee DJD.  Cortisone injections performed today for both knees.  Follow-up as needed.  Follow-Up Instructions: No follow-ups on file.   Orders:  No orders of the defined types were placed in this encounter.  No orders of the defined types were placed in this encounter.     Procedures: Large Joint Inj: bilateral knee on 08/22/2022 1:47 PM Indications: pain Details: 22 G needle  Arthrogram: No  Medications (Right): 2 mL lidocaine 1 %; 2 mL bupivacaine 0.5 %; 40 mg methylPREDNISolone acetate 40 MG/ML Medications (Left): 2 mL lidocaine 1 %; 2 mL bupivacaine 0.5 %; 40 mg methylPREDNISolone acetate 40 MG/ML Outcome: tolerated well, no immediate complications Patient was prepped and draped in the usual sterile fashion.       Clinical Data: No additional findings.   Subjective: Chief Complaint  Patient presents with   Right Knee - Pain   Left Knee - Pain    HPI [name] is here for follow-up on bilateral knee DJD.  Requesting repeat injections.  Steroid injections are still effective. Review of Systems   Objective: Vital Signs: There were no vitals taken for this visit.  Physical Exam  Ortho Exam Examination bilateral knees unchanged Specialty Comments:  No specialty comments available.  Imaging: No results found.   PMFS History: Patient Active Problem List   Diagnosis Date Noted   Incisional hernia 03/20/2022   Bilateral primary osteoarthritis of knee 10/05/2021   Body mass index 45.0-49.9, adult (HCC) 10/05/2021   Morbid obesity (HCC) 10/05/2021   Essential  hypertension 09/29/2021   Other constipation 09/29/2021   GAD (generalized anxiety disorder) 09/29/2021   Refusal of blood transfusions as patient is Jehovah's Witness 09/08/2020   Diverticulitis of sigmoid colon 09/08/2020   Body mass index (BMI) 40.0-44.9, adult (HCC) 05/18/2020   Acquired thrombophilia (HCC) 05/18/2020   Colostomy present (HCC) 05/18/2020   Chronic pain 05/18/2020   Diverticular disease of colon 05/18/2020   Esophageal reflux 05/18/2020   Panic disorder 05/18/2020   Mixed anxiety and depressive disorder 05/18/2020   Irritable bowel syndrome 05/18/2020   Mixed hyperlipidemia 05/18/2020   Presence of IVC filter 12/03/2019   Protein-calorie malnutrition, severe 11/27/2019   Hypertension 11/14/2019   Anemia, chronic disease 11/14/2019   Past Medical History:  Diagnosis Date   Anemia    Anxiety    Arthritis    Back pain    Constipation    Diverticulitis    Diverticulitis of large intestine with abscess 11/14/2019   DVT (deep venous thrombosis) (HCC)    Hypertension    IBS (irritable bowel syndrome)    Intra-abdominal abscess (HCC) 11/02/2019   Joint pain    Osteoarthritis    PONV (postoperative nausea and vomiting)    Pre-diabetes    Refusal of blood product    Rheumatoid arthritis (HCC)    Severe sepsis (HCC) 11/06/2019   Stomach ulcer    Vitamin D deficiency     Family History  Problem Relation Age of Onset   Hypertension Mother    Diabetes Mother    High Cholesterol Mother    Cancer Father    Diabetes Father    High blood pressure Father    Alcoholism Father     Past Surgical History:  Procedure Laterality Date   ABDOMINAL HYSTERECTOMY     BOWEL RESECTION N/A 11/25/2019   Procedure: SMALL BOWEL RESECTION;  Surgeon: Almond Lint, MD;  [facility];  Service: General;  Laterality: N/A;   COLECTOMY N/A 11/25/2019   Procedure: SIGMOID COLECTOMY;  Surgeon: Almond Lint, MD;  [facility];  Service: General;  Laterality: N/A;   COLOSTOMY  N/A 11/25/2019   Procedure: CREATION OF COLOSTOMY;  Surgeon: Almond Lint, MD;  [facility];  Service: General;  Laterality: N/A;   INCISION AND DRAINAGE ABSCESS N/A 11/25/2019   Procedure: INCISION AND DRAINAGE ABDOMINAL ABSCESS;  Surgeon: Almond Lint, MD;  [facility];  Service: General;  Laterality: N/A;   IR IVC FILTER PLMT / S&I /IMG GUID/MOD SED  12/03/2019   IVC FILTER REMOVAL N/A 10/26/2020   Procedure: IVC FILTER REMOVAL;  Surgeon: Nada Libman, MD;  [facility];  Service: Cardiovascular;  Laterality: N/A;   LAPAROTOMY N/A 11/25/2019   Procedure: EXPLORATORY LAPAROTOMY;  Surgeon: Almond Lint, MD;  [facility];  Service: General;  Laterality: N/A;   LYSIS OF ADHESION N/A 09/08/2020   Procedure: LYSIS OF ADHESION;  Surgeon: Karie Soda, MD;  [facility];  Service: General;  Laterality: N/A;   PROCTOSCOPY N/A 09/08/2020   Procedure: RIGID PROCTOSCOPY;  Surgeon: Karie Soda, MD;  [facility];  Service: General;  Laterality: N/A;   XI ROBOTIC ASSISTED COLOSTOMY TAKEDOWN N/A 09/08/2020   Procedure: XI ROBOTIC ASSISTED OSTOMY TAKEDOWN;TAP BLOCKS, PRIMARY INCISIONAL PERITONEAL HERNIA REPAIR;  Surgeon: Karie Soda, MD;  [facility];  Service: General;  Laterality: N/A;   XI ROBOTIC ASSISTED VENTRAL HERNIA N/A 03/20/2022   Procedure: ROBOTIC INCISIONAL HERNIA REPAIR WITH MESH;  Surgeon: Quentin Ore, MD;  [facility];  Service: General;  Laterality: N/A;  Bilateral posterior rectus myofascial release, bilateral transversus abdominis myofascial release   Social History   Occupational History    Employer: [employer]   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

## 2022-11-23 ENCOUNTER — Encounter: Payer: Self-pay | Admitting: Orthopaedic Surgery

## 2022-11-23 ENCOUNTER — Ambulatory Visit: Payer: Commercial Managed Care - PPO | Admitting: Orthopaedic Surgery

## 2022-11-23 DIAGNOSIS — M170 Bilateral primary osteoarthritis of knee: Secondary | ICD-10-CM

## 2022-11-23 DIAGNOSIS — M1712 Unilateral primary osteoarthritis, left knee: Secondary | ICD-10-CM

## 2022-11-23 DIAGNOSIS — M1711 Unilateral primary osteoarthritis, right knee: Secondary | ICD-10-CM | POA: Diagnosis not present

## 2022-11-23 MED ORDER — METHYLPREDNISOLONE ACETATE 40 MG/ML IJ SUSP
40.0000 mg | INTRAMUSCULAR | Status: AC | PRN
Start: 2022-11-23 — End: 2022-11-23
  Administered 2022-11-23: 40 mg via INTRA_ARTICULAR

## 2022-11-23 MED ORDER — BUPIVACAINE HCL 0.5 % IJ SOLN
2.0000 mL | INTRAMUSCULAR | Status: AC | PRN
Start: 2022-11-23 — End: 2022-11-23
  Administered 2022-11-23: 2 mL via INTRA_ARTICULAR

## 2022-11-23 MED ORDER — DICLOFENAC SODIUM 75 MG PO TBEC: 75.0000 mg | DELAYED_RELEASE_TABLET | Freq: Two times a day (BID) | ORAL | 2 refills | Status: AC

## 2022-11-23 MED ORDER — LIDOCAINE HCL 1 % IJ SOLN
2.0000 mL | INTRAMUSCULAR | Status: AC | PRN
Start: 2022-11-23 — End: 2022-11-23
  Administered 2022-11-23: 2 mL

## 2022-11-23 NOTE — Progress Notes (Signed)
Office Visit Note   Patient: [name]           Date of Birth: 1966/01/01           MRN: [id number] Visit Date: 11/23/2022              Requested by: Soundra Pilon, FNP [address] [place],  [place] [address] PCP: Soundra Pilon, FNP   Assessment & Plan: Visit Diagnoses:  1. Bilateral primary osteoarthritis of knee     Plan: Both knees were injected with cortisone today.  Will see her back as needed.  She requested a refill on the diclofenac which I instructed is to be taken sparingly and not longer than 2 weeks at a time.  Follow-Up Instructions: No follow-ups on file.   Orders:  No orders of the defined types were placed in this encounter.  Meds ordered this encounter  Medications   diclofenac (VOLTAREN) 75 MG EC tablet    Sig: Take 1 tablet (75 mg total) by mouth 2 (two) times daily.    Dispense:  30 tablet    Refill:  2      Procedures: Large Joint Inj: bilateral knee on 11/23/2022 11:15 AM Indications: pain Details: 22 G needle  Arthrogram: No  Medications (Right): 2 mL lidocaine 1 %; 2 mL bupivacaine 0.5 %; 40 mg methylPREDNISolone acetate 40 MG/ML Medications (Left): 2 mL lidocaine 1 %; 2 mL bupivacaine 0.5 %; 40 mg methylPREDNISolone acetate 40 MG/ML Outcome: tolerated well, no immediate complications Patient was prepped and draped in the usual sterile fashion.       Clinical Data: No additional findings.   Subjective: Chief Complaint  Patient presents with   Right Knee - Pain   Left Knee - Pain    HPI [name] returns today for repeat bilateral knee cortisone injections.  These are still quite helpful. Review of Systems   Objective: Vital Signs: There were no vitals taken for this visit.  Physical Exam  Ortho Exam No changes in exam. Specialty Comments:  No specialty comments available.  Imaging: No results found.   PMFS History: Patient Active Problem List   Diagnosis Date Noted   Incisional hernia 03/20/2022    Bilateral primary osteoarthritis of knee 10/05/2021   Body mass index 45.0-49.9, adult (HCC) 10/05/2021   Morbid obesity (HCC) 10/05/2021   Essential hypertension 09/29/2021   Other constipation 09/29/2021   GAD (generalized anxiety disorder) 09/29/2021   Refusal of blood transfusions as patient is Jehovah's Witness 09/08/2020   Diverticulitis of sigmoid colon 09/08/2020   Body mass index (BMI) 40.0-44.9, adult (HCC) 05/18/2020   Acquired thrombophilia (HCC) 05/18/2020   Colostomy present (HCC) 05/18/2020   Chronic pain 05/18/2020   Diverticular disease of colon 05/18/2020   Esophageal reflux 05/18/2020   Panic disorder 05/18/2020   Mixed anxiety and depressive disorder 05/18/2020   Irritable bowel syndrome 05/18/2020   Mixed hyperlipidemia 05/18/2020   Presence of IVC filter 12/03/2019   Protein-calorie malnutrition, severe 11/27/2019   Hypertension 11/14/2019   Anemia, chronic disease 11/14/2019   Past Medical History:  Diagnosis Date   Anemia    Anxiety    Arthritis    Back pain    Constipation    Diverticulitis    Diverticulitis of large intestine with abscess 11/14/2019   DVT (deep venous thrombosis) (HCC)    Hypertension    IBS (irritable bowel syndrome)    Intra-abdominal abscess (HCC) 11/02/2019   Joint pain    Osteoarthritis  PONV (postoperative nausea and vomiting)    Pre-diabetes    Refusal of blood product    Rheumatoid arthritis (HCC)    Severe sepsis (HCC) 11/06/2019   Stomach ulcer    Vitamin D deficiency     Family History  Problem Relation Age of Onset   Hypertension Mother    Diabetes Mother    High Cholesterol Mother    Cancer Father    Diabetes Father    High blood pressure Father    Alcoholism Father     Past Surgical History:  Procedure Laterality Date   ABDOMINAL HYSTERECTOMY     BOWEL RESECTION N/A 11/25/2019   Procedure: SMALL BOWEL RESECTION;  Surgeon: Almond Lint, MD;  [facility];  Service: General;  Laterality: N/A;    COLECTOMY N/A 11/25/2019   Procedure: SIGMOID COLECTOMY;  Surgeon: Almond Lint, MD;  [facility];  Service: General;  Laterality: N/A;   COLOSTOMY N/A 11/25/2019   Procedure: CREATION OF COLOSTOMY;  Surgeon: Almond Lint, MD;  [facility];  Service: General;  Laterality: N/A;   INCISION AND DRAINAGE ABSCESS N/A 11/25/2019   Procedure: INCISION AND DRAINAGE ABDOMINAL ABSCESS;  Surgeon: Almond Lint, MD;  [facility];  Service: General;  Laterality: N/A;   IR IVC FILTER PLMT / S&I /IMG GUID/MOD SED  12/03/2019   IVC FILTER REMOVAL N/A 10/26/2020   Procedure: IVC FILTER REMOVAL;  Surgeon: Nada Libman, MD;  [facility];  Service: Cardiovascular;  Laterality: N/A;   LAPAROTOMY N/A 11/25/2019   Procedure: EXPLORATORY LAPAROTOMY;  Surgeon: Almond Lint, MD;  [facility];  Service: General;  Laterality: N/A;   LYSIS OF ADHESION N/A 09/08/2020   Procedure: LYSIS OF ADHESION;  Surgeon: Karie Soda, MD;  [facility];  Service: General;  Laterality: N/A;   PROCTOSCOPY N/A 09/08/2020   Procedure: RIGID PROCTOSCOPY;  Surgeon: Karie Soda, MD;  [facility];  Service: General;  Laterality: N/A;   XI ROBOTIC ASSISTED COLOSTOMY TAKEDOWN N/A 09/08/2020   Procedure: XI ROBOTIC ASSISTED OSTOMY TAKEDOWN;TAP BLOCKS, PRIMARY INCISIONAL PERITONEAL HERNIA REPAIR;  Surgeon: Karie Soda, MD;  [facility];  Service: General;  Laterality: N/A;   XI ROBOTIC ASSISTED VENTRAL HERNIA N/A 03/20/2022   Procedure: ROBOTIC INCISIONAL HERNIA REPAIR WITH MESH;  Surgeon: Quentin Ore, MD;  [facility];  Service: General;  Laterality: N/A;  Bilateral posterior rectus myofascial release, bilateral transversus abdominis myofascial release   Social History   Occupational History    Employer: [employer]   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

## 2023-03-15 ENCOUNTER — Ambulatory Visit: Payer: Commercial Managed Care - PPO | Admitting: Orthopaedic Surgery

## 2023-03-21 NOTE — Progress Notes (Signed)
cancelled

## 2023-03-22 ENCOUNTER — Ambulatory Visit (INDEPENDENT_AMBULATORY_CARE_PROVIDER_SITE_OTHER): Payer: Commercial Managed Care - PPO | Admitting: Orthopaedic Surgery

## 2023-03-22 DIAGNOSIS — M1712 Unilateral primary osteoarthritis, left knee: Secondary | ICD-10-CM

## 2023-03-22 DIAGNOSIS — M1711 Unilateral primary osteoarthritis, right knee: Secondary | ICD-10-CM

## 2023-05-14 NOTE — Progress Notes (Deleted)
Office Visit Note   Patient: [name]           Date of Birth: 10-Dec-1965           MRN: [id number] Visit Date: 05/15/2023              Requested by: Soundra Pilon, FNP [phone] [place],  [place] [address] PCP: Soundra Pilon, FNP   Assessment & Plan: Visit Diagnoses:  1. Primary osteoarthritis of right knee   2. Primary osteoarthritis of left knee     Plan: ***  Follow-Up Instructions: No follow-ups on file.   Orders:  No orders of the defined types were placed in this encounter.  No orders of the defined types were placed in this encounter.     Procedures: No procedures performed   Clinical Data: No additional findings.   Subjective: No chief complaint on file.   HPI  Review of Systems   Objective: Vital Signs: There were no vitals taken for this visit.  Physical Exam  Ortho Exam  Specialty Comments:  No specialty comments available.  Imaging: No results found.   PMFS History: Patient Active Problem List   Diagnosis Date Noted   Incisional hernia 03/20/2022   Bilateral primary osteoarthritis of knee 10/05/2021   Body mass index 45.0-49.9, adult (HCC) 10/05/2021   Morbid obesity (HCC) 10/05/2021   Essential hypertension 09/29/2021   Other constipation 09/29/2021   GAD (generalized anxiety disorder) 09/29/2021   Refusal of blood transfusions as patient is Jehovah's Witness 09/08/2020   Diverticulitis of sigmoid colon 09/08/2020   Body mass index (BMI) 40.0-44.9, adult (HCC) 05/18/2020   Acquired thrombophilia (HCC) 05/18/2020   Colostomy present (HCC) 05/18/2020   Chronic pain 05/18/2020   Diverticular disease of colon 05/18/2020   Esophageal reflux 05/18/2020   Panic disorder 05/18/2020   Mixed anxiety and depressive disorder 05/18/2020   Irritable bowel syndrome 05/18/2020   Mixed hyperlipidemia 05/18/2020   Presence of IVC filter 12/03/2019   Protein-calorie malnutrition, severe 11/27/2019    Hypertension 11/14/2019   Anemia, chronic disease 11/14/2019   Past Medical History:  Diagnosis Date   Anemia    Anxiety    Arthritis    Back pain    Constipation    Diverticulitis    Diverticulitis of large intestine with abscess 11/14/2019   DVT (deep venous thrombosis) (HCC)    Hypertension    IBS (irritable bowel syndrome)    Intra-abdominal abscess (HCC) 11/02/2019   Joint pain    Osteoarthritis    PONV (postoperative nausea and vomiting)    Pre-diabetes    Refusal of blood product    Rheumatoid arthritis (HCC)    Severe sepsis (HCC) 11/06/2019   Stomach ulcer    Vitamin D deficiency     Family History  Problem Relation Age of Onset   Hypertension Mother    Diabetes Mother    High Cholesterol Mother    Cancer Father    Diabetes Father    High blood pressure Father    Alcoholism Father     Past Surgical History:  Procedure Laterality Date   ABDOMINAL HYSTERECTOMY     BOWEL RESECTION N/A 11/25/2019   Procedure: SMALL BOWEL RESECTION;  Surgeon: Almond Lint, MD;  [facility];  Service: General;  Laterality: N/A;   COLECTOMY N/A 11/25/2019   Procedure: SIGMOID COLECTOMY;  Surgeon: Almond Lint, MD;  [facility];  Service: General;  Laterality: N/A;   COLOSTOMY N/A 11/25/2019  Procedure: CREATION OF COLOSTOMY;  Surgeon: Almond Lint, MD;  [facility];  Service: General;  Laterality: N/A;   INCISION AND DRAINAGE ABSCESS N/A 11/25/2019   Procedure: INCISION AND DRAINAGE ABDOMINAL ABSCESS;  Surgeon: Almond Lint, MD;  [facility];  Service: General;  Laterality: N/A;   IR IVC FILTER PLMT / S&I Lenise Arena GUID/MOD SED  12/03/2019   IVC FILTER REMOVAL N/A 10/26/2020   Procedure: IVC FILTER REMOVAL;  Surgeon: Nada Libman, MD;  [facility];  Service: Cardiovascular;  Laterality: N/A;   LAPAROTOMY N/A 11/25/2019   Procedure: EXPLORATORY LAPAROTOMY;  Surgeon: Almond Lint, MD;  [facility];  Service: General;  Laterality: N/A;   LYSIS OF  ADHESION N/A 09/08/2020   Procedure: LYSIS OF ADHESION;  Surgeon: Karie Soda, MD;  [facility];  Service: General;  Laterality: N/A;   PROCTOSCOPY N/A 09/08/2020   Procedure: RIGID PROCTOSCOPY;  Surgeon: Karie Soda, MD;  [facility];  Service: General;  Laterality: N/A;   XI ROBOTIC ASSISTED COLOSTOMY TAKEDOWN N/A 09/08/2020   Procedure: XI ROBOTIC ASSISTED OSTOMY TAKEDOWN;TAP BLOCKS, PRIMARY INCISIONAL PERITONEAL HERNIA REPAIR;  Surgeon: Karie Soda, MD;  [facility];  Service: General;  Laterality: N/A;   XI ROBOTIC ASSISTED VENTRAL HERNIA N/A 03/20/2022   Procedure: ROBOTIC INCISIONAL HERNIA REPAIR WITH MESH;  Surgeon: Quentin Ore, MD;  [facility];  Service: General;  Laterality: N/A;  Bilateral posterior rectus myofascial release, bilateral transversus abdominis myofascial release   Social History   Occupational History    Employer: [employer]   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

## 2023-05-15 ENCOUNTER — Ambulatory Visit: Payer: Commercial Managed Care - PPO | Admitting: Orthopaedic Surgery

## 2023-05-15 DIAGNOSIS — M1711 Unilateral primary osteoarthritis, right knee: Secondary | ICD-10-CM

## 2023-05-15 DIAGNOSIS — M1712 Unilateral primary osteoarthritis, left knee: Secondary | ICD-10-CM

## 2023-10-09 ENCOUNTER — Ambulatory Visit: Admitting: Orthopaedic Surgery

## 2024-02-04 ENCOUNTER — Encounter: Payer: Self-pay | Admitting: Radiology

## 2024-04-30 ENCOUNTER — Ambulatory Visit: Admitting: Orthopaedic Surgery

## 2024-04-30 DIAGNOSIS — M1712 Unilateral primary osteoarthritis, left knee: Secondary | ICD-10-CM

## 2024-04-30 DIAGNOSIS — M170 Bilateral primary osteoarthritis of knee: Secondary | ICD-10-CM | POA: Diagnosis not present

## 2024-04-30 DIAGNOSIS — M1711 Unilateral primary osteoarthritis, right knee: Secondary | ICD-10-CM

## 2024-04-30 MED ORDER — METHYLPREDNISOLONE ACETATE 40 MG/ML IJ SUSP
40.0000 mg | INTRAMUSCULAR | Status: AC | PRN
  Administered 2024-04-30: 40 mg via INTRA_ARTICULAR

## 2024-04-30 MED ORDER — LIDOCAINE HCL 1 % IJ SOLN
2.0000 mL | INTRAMUSCULAR | Status: AC | PRN
  Administered 2024-04-30: 2 mL

## 2024-04-30 MED ORDER — BUPIVACAINE HCL 0.5 % IJ SOLN
2.0000 mL | INTRAMUSCULAR | Status: AC | PRN
  Administered 2024-04-30: 2 mL via INTRA_ARTICULAR

## 2024-04-30 NOTE — Progress Notes (Signed)
 "  Office Visit Note   Patient: [name]           Date of Birth: March 21, 1966           MRN: [id number] Visit Date: 04/30/2024              Requested by: Marvene Prentice SAUNDERS, FNP [phone] [address] [place],  [place] [address] PCP: Marvene Prentice SAUNDERS, FNP   Assessment & Plan: Visit Diagnoses:  1. Primary osteoarthritis of right knee   2. Primary osteoarthritis of left knee     Plan: Impression is 59 year old female with end-stage of bilateral knee osteoarthritis.  Will bilateral knee steroid injections administered today.  She will continue all efforts at weight loss.  Will see her back as needed.  Follow-Up Instructions: Return if symptoms worsen or fail to improve.   Orders:  No orders of the defined types were placed in this encounter.  No orders of the defined types were placed in this encounter.     Procedures: Large Joint Inj: bilateral knee on 04/30/2024 3:24 PM Indications: pain Details: 22 G needle  Arthrogram: No  Medications (Right): 2 mL lidocaine  1 %; 2 mL bupivacaine  0.5 %; 40 mg methylPREDNISolone  acetate 40 MG/ML Medications (Left): 2 mL lidocaine  1 %; 2 mL bupivacaine  0.5 %; 40 mg methylPREDNISolone  acetate 40 MG/ML Outcome: tolerated well, no immediate complications Patient was prepped and draped in the usual sterile fashion.       Clinical Data: No additional findings.   Subjective: Chief Complaint  Patient presents with   Right Knee - Pain   Left Knee - Pain    HPI [place] returns today for follow-up evaluation of bilateral knee osteoarthritis. Review of Systems  Constitutional: Negative.   HENT: Negative.    Eyes: Negative.   Respiratory: Negative.    Cardiovascular: Negative.   Endocrine: Negative.   Musculoskeletal: Negative.   Neurological: Negative.   Hematological: Negative.   Psychiatric/Behavioral: Negative.    All other systems reviewed and are negative.    Objective: Vital Signs: There were no vitals taken for  this visit.  Physical Exam Vitals and nursing note reviewed.  Constitutional:      Appearance: She is well-developed.  HENT:     Head: Atraumatic.     Nose: Nose normal.  Eyes:     Extraocular Movements: Extraocular movements intact.  Cardiovascular:     Pulses: Normal pulses.  Pulmonary:     Effort: Pulmonary effort is normal.  Abdominal:     Palpations: Abdomen is soft.  Musculoskeletal:     Cervical back: Neck supple.  Skin:    General: Skin is warm.     Capillary Refill: Capillary refill takes less than 2 seconds.  Neurological:     Mental Status: She is alert. Mental status is at baseline.  Psychiatric:        Behavior: Behavior normal.        Thought Content: Thought content normal.        Judgment: Judgment normal.     Ortho Exam Examination of bilateral knees are unchanged from prior visit.  No joint effusions. Specialty Comments:  No specialty comments available.  Imaging: No results found.   PMFS History: Patient Active Problem List   Diagnosis Date Noted   Incisional hernia 03/20/2022   Bilateral primary osteoarthritis of knee 10/05/2021   Body mass index 45.0-49.9, adult (HCC) 10/05/2021   Morbid obesity (HCC) 10/05/2021   Essential hypertension 09/29/2021   Other  constipation 09/29/2021   GAD (generalized anxiety disorder) 09/29/2021   Refusal of blood transfusions as patient is Jehovah's Witness 09/08/2020   Diverticulitis of sigmoid colon 09/08/2020   Body mass index (BMI) 40.0-44.9, adult (HCC) 05/18/2020   Acquired thrombophilia 05/18/2020   Colostomy present (HCC) 05/18/2020   Chronic pain 05/18/2020   Diverticular disease of colon 05/18/2020   Esophageal reflux 05/18/2020   Panic disorder 05/18/2020   Mixed anxiety and depressive disorder 05/18/2020   Irritable bowel syndrome 05/18/2020   Mixed hyperlipidemia 05/18/2020   Presence of IVC filter 12/03/2019   Protein-calorie malnutrition, severe 11/27/2019   Hypertension 11/14/2019    Anemia, chronic disease 11/14/2019   Past Medical History:  Diagnosis Date   Anemia    Anxiety    Arthritis    Back pain    Constipation    Diverticulitis    Diverticulitis of large intestine with abscess 11/14/2019   DVT (deep venous thrombosis) (HCC)    Hypertension    IBS (irritable bowel syndrome)    Intra-abdominal abscess (HCC) 11/02/2019   Joint pain    Osteoarthritis    PONV (postoperative nausea and vomiting)    Pre-diabetes    Refusal of blood product    Rheumatoid arthritis (HCC)    Severe sepsis (HCC) 11/06/2019   Stomach ulcer    Vitamin D deficiency     Family History  Problem Relation Age of Onset   Hypertension Mother    Diabetes Mother    High Cholesterol Mother    Cancer Father    Diabetes Father    High blood pressure Father    Alcoholism Father     Past Surgical History:  Procedure Laterality Date   ABDOMINAL HYSTERECTOMY     BOWEL RESECTION N/A 11/25/2019   Procedure: SMALL BOWEL RESECTION;  Surgeon: Aron Shoulders, MD;  Location: [facility];  Service: General;  Laterality: N/A;   COLECTOMY N/A 11/25/2019   Procedure: SIGMOID COLECTOMY;  Surgeon: Aron Shoulders, MD;  Location: [facility];  Service: General;  Laterality: N/A;   COLOSTOMY N/A 11/25/2019   Procedure: CREATION OF COLOSTOMY;  Surgeon: Aron Shoulders, MD;  Location: [facility];  Service: General;  Laterality: N/A;   INCISION AND DRAINAGE ABSCESS N/A 11/25/2019   Procedure: INCISION AND DRAINAGE ABDOMINAL ABSCESS;  Surgeon: Aron Shoulders, MD;  Location: [facility];  Service: General;  Laterality: N/A;   IR IVC FILTER PLMT / S&I /IMG GUID/MOD SED  12/03/2019   IVC FILTER REMOVAL N/A 10/26/2020   Procedure: IVC FILTER REMOVAL;  Surgeon: Serene Gaile ORN, MD;  Location: [facility];  Service: Cardiovascular;  Laterality: N/A;   LAPAROTOMY N/A 11/25/2019   Procedure: EXPLORATORY LAPAROTOMY;  Surgeon: Aron Shoulders, MD;  Location: [facility];  Service: General;  Laterality: N/A;   LYSIS OF ADHESION N/A 09/08/2020    Procedure: LYSIS OF ADHESION;  Surgeon: Sheldon Standing, MD;  Location: [facility];  Service: General;  Laterality: N/A;   PROCTOSCOPY N/A 09/08/2020   Procedure: RIGID PROCTOSCOPY;  Surgeon: Sheldon Standing, MD;  Location: [facility];  Service: General;  Laterality: N/A;   XI ROBOTIC ASSISTED COLOSTOMY TAKEDOWN N/A 09/08/2020   Procedure: XI ROBOTIC ASSISTED OSTOMY TAKEDOWN;TAP BLOCKS, PRIMARY INCISIONAL PERITONEAL HERNIA REPAIR;  Surgeon: Sheldon Standing, MD;  Location: [facility];  Service: General;  Laterality: N/A;   XI ROBOTIC ASSISTED VENTRAL HERNIA N/A 03/20/2022   Procedure: ROBOTIC INCISIONAL HERNIA REPAIR WITH MESH;  Surgeon: Lyndel Deward PARAS, MD;  Location: [facility];  Service: General;  Laterality:  N/A;  Bilateral posterior rectus myofascial release, bilateral transversus abdominis myofascial release   Social History   Occupational History    Employer: [employer]   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
